# Patient Record
Sex: Male | Born: 1945
Health system: Southern US, Community
[De-identification: ages and names within clinical notes are randomized; demographics above are authoritative.]

## PROBLEM LIST (undated history)

## (undated) DIAGNOSIS — E039 Hypothyroidism, unspecified: Secondary | ICD-10-CM

## (undated) DIAGNOSIS — C801 Malignant (primary) neoplasm, unspecified: Secondary | ICD-10-CM

## (undated) DIAGNOSIS — E079 Disorder of thyroid, unspecified: Secondary | ICD-10-CM

## (undated) DIAGNOSIS — I10 Essential (primary) hypertension: Secondary | ICD-10-CM

## (undated) DIAGNOSIS — L709 Acne, unspecified: Secondary | ICD-10-CM

## (undated) DIAGNOSIS — R0600 Dyspnea, unspecified: Secondary | ICD-10-CM

## (undated) DIAGNOSIS — E119 Type 2 diabetes mellitus without complications: Secondary | ICD-10-CM

## (undated) HISTORY — DX: Essential (primary) hypertension: I10

## (undated) HISTORY — DX: Malignant (primary) neoplasm, unspecified: C80.1

## (undated) HISTORY — DX: Acne, unspecified: L70.9

## (undated) HISTORY — PX: TONSILLECTOMY: SHX5217

## (undated) HISTORY — DX: Disorder of thyroid, unspecified: E07.9

## (undated) HISTORY — PX: UPPER GASTROINTESTINAL ENDOSCOPY: SHX188

---

## 2001-02-02 ENCOUNTER — Inpatient Hospital Stay (HOSPITAL_COMMUNITY): Admission: AD | Admit: 2001-02-02 | Discharge: 2001-02-05 | Payer: Self-pay | Admitting: Family Medicine

## 2001-02-02 ENCOUNTER — Encounter: Payer: Self-pay | Admitting: Family Medicine

## 2001-02-05 ENCOUNTER — Encounter: Payer: Self-pay | Admitting: Family Medicine

## 2001-02-10 ENCOUNTER — Encounter: Payer: Self-pay | Admitting: Family Medicine

## 2001-02-10 ENCOUNTER — Ambulatory Visit (HOSPITAL_COMMUNITY): Admission: RE | Admit: 2001-02-10 | Discharge: 2001-02-10 | Payer: Self-pay | Admitting: Family Medicine

## 2003-02-14 ENCOUNTER — Encounter: Admission: RE | Admit: 2003-02-14 | Discharge: 2003-02-14 | Payer: Self-pay | Admitting: General Surgery

## 2003-02-16 ENCOUNTER — Ambulatory Visit (HOSPITAL_BASED_OUTPATIENT_CLINIC_OR_DEPARTMENT_OTHER): Admission: RE | Admit: 2003-02-16 | Discharge: 2003-02-16 | Payer: Self-pay | Admitting: General Surgery

## 2003-02-16 ENCOUNTER — Ambulatory Visit (HOSPITAL_COMMUNITY): Admission: RE | Admit: 2003-02-16 | Discharge: 2003-02-16 | Payer: Self-pay | Admitting: General Surgery

## 2004-06-05 ENCOUNTER — Ambulatory Visit: Payer: Self-pay | Admitting: Family Medicine

## 2004-06-12 ENCOUNTER — Ambulatory Visit: Payer: Self-pay | Admitting: Family Medicine

## 2004-10-23 ENCOUNTER — Ambulatory Visit: Payer: Self-pay | Admitting: Family Medicine

## 2004-10-29 ENCOUNTER — Ambulatory Visit: Payer: Self-pay | Admitting: Family Medicine

## 2005-04-21 HISTORY — PX: HERNIA REPAIR: SHX51

## 2005-08-19 ENCOUNTER — Ambulatory Visit: Payer: Self-pay | Admitting: Family Medicine

## 2005-11-03 ENCOUNTER — Ambulatory Visit: Payer: Self-pay | Admitting: Family Medicine

## 2005-11-27 ENCOUNTER — Ambulatory Visit: Payer: Self-pay | Admitting: Family Medicine

## 2006-11-25 ENCOUNTER — Ambulatory Visit: Payer: Self-pay | Admitting: Family Medicine

## 2006-11-25 LAB — CONVERTED CEMR LAB
ALT: 37 units/L (ref 0–53)
AST: 26 units/L (ref 0–37)
Albumin: 4.3 g/dL (ref 3.5–5.2)
Alkaline Phosphatase: 54 units/L (ref 39–117)
BUN: 19 mg/dL (ref 6–23)
Basophils Absolute: 0.1 10*3/uL (ref 0.0–0.1)
Basophils Relative: 1.1 % — ABNORMAL HIGH (ref 0.0–1.0)
Bilirubin Urine: NEGATIVE
Bilirubin, Direct: 0.2 mg/dL (ref 0.0–0.3)
Blood in Urine, dipstick: NEGATIVE
CO2: 27 meq/L (ref 19–32)
Calcium: 9.4 mg/dL (ref 8.4–10.5)
Chloride: 102 meq/L (ref 96–112)
Cholesterol: 179 mg/dL (ref 0–200)
Creatinine, Ser: 1 mg/dL (ref 0.4–1.5)
Eosinophils Absolute: 0.1 10*3/uL (ref 0.0–0.6)
Eosinophils Relative: 2.6 % (ref 0.0–5.0)
GFR calc Af Amer: 98 mL/min
GFR calc non Af Amer: 81 mL/min
Glucose, Bld: 118 mg/dL — ABNORMAL HIGH (ref 70–99)
Glucose, Urine, Semiquant: NEGATIVE
HCT: 44 % (ref 39.0–52.0)
HDL: 37.3 mg/dL — ABNORMAL LOW (ref >39.0)
Hemoglobin: 15.6 g/dL (ref 13.0–17.0)
Ketones, urine, test strip: NEGATIVE
LDL Cholesterol: 118 mg/dL — ABNORMAL HIGH (ref 0–99)
Lymphocytes Relative: 23.4 % (ref 12.0–46.0)
MCHC: 35.5 g/dL (ref 30.0–36.0)
MCV: 91.7 fL (ref 78.0–100.0)
Monocytes Absolute: 0.4 10*3/uL (ref 0.2–0.7)
Monocytes Relative: 8 % (ref 3.0–11.0)
Neutro Abs: 3 10*3/uL (ref 1.4–7.7)
Neutrophils Relative %: 64.9 % (ref 43.0–77.0)
Nitrite: NEGATIVE
PSA: 0.66 ng/mL (ref 0.10–4.00)
Platelets: 192 10*3/uL (ref 150–400)
Potassium: 3.2 meq/L — ABNORMAL LOW (ref 3.5–5.1)
Protein, U semiquant: NEGATIVE
RBC: 4.8 M/uL (ref 4.22–5.81)
RDW: 12.8 % (ref 11.5–14.6)
Sodium: 139 meq/L (ref 135–145)
Specific Gravity, Urine: 1.02
TSH: 0.69 microintl units/mL (ref 0.35–5.50)
Total Bilirubin: 1.4 mg/dL — ABNORMAL HIGH (ref 0.3–1.2)
Total CHOL/HDL Ratio: 4.8
Total Protein: 6.7 g/dL (ref 6.0–8.3)
Triglycerides: 121 mg/dL (ref 0–149)
Urobilinogen, UA: NEGATIVE
VLDL: 24 mg/dL (ref 0–40)
WBC Urine, dipstick: NEGATIVE
WBC: 4.7 10*3/uL (ref 4.5–10.5)
pH: 5.5

## 2006-12-01 ENCOUNTER — Ambulatory Visit: Payer: Self-pay | Admitting: Family Medicine

## 2006-12-01 DIAGNOSIS — M109 Gout, unspecified: Secondary | ICD-10-CM | POA: Insufficient documentation

## 2006-12-01 DIAGNOSIS — I10 Essential (primary) hypertension: Secondary | ICD-10-CM | POA: Insufficient documentation

## 2006-12-01 DIAGNOSIS — E039 Hypothyroidism, unspecified: Secondary | ICD-10-CM | POA: Insufficient documentation

## 2007-01-27 ENCOUNTER — Ambulatory Visit: Payer: Self-pay | Admitting: Family Medicine

## 2007-12-06 ENCOUNTER — Telehealth: Payer: Self-pay | Admitting: Family Medicine

## 2008-01-27 ENCOUNTER — Ambulatory Visit: Payer: Self-pay | Admitting: Family Medicine

## 2008-01-27 LAB — CONVERTED CEMR LAB
ALT: 32 units/L (ref 0–53)
AST: 23 units/L (ref 0–37)
Albumin: 4.2 g/dL (ref 3.5–5.2)
Alkaline Phosphatase: 50 units/L (ref 39–117)
BUN: 19 mg/dL (ref 6–23)
Basophils Absolute: 0.1 10*3/uL (ref 0.0–0.1)
Basophils Relative: 1 % (ref 0.0–3.0)
Bilirubin Urine: NEGATIVE
Bilirubin, Direct: 0.2 mg/dL (ref 0.0–0.3)
Blood in Urine, dipstick: NEGATIVE
CO2: 30 meq/L (ref 19–32)
Calcium: 9.3 mg/dL (ref 8.4–10.5)
Chloride: 104 meq/L (ref 96–112)
Cholesterol: 149 mg/dL (ref 0–200)
Creatinine, Ser: 1.1 mg/dL (ref 0.4–1.5)
Eosinophils Absolute: 0.1 10*3/uL (ref 0.0–0.7)
Eosinophils Relative: 2 % (ref 0.0–5.0)
GFR calc Af Amer: 88 mL/min
GFR calc non Af Amer: 73 mL/min
Glucose, Bld: 99 mg/dL (ref 70–99)
Glucose, Urine, Semiquant: NEGATIVE
HCT: 42.1 % (ref 39.0–52.0)
HDL: 33.9 mg/dL — ABNORMAL LOW (ref >39.0)
Hemoglobin: 14.9 g/dL (ref 13.0–17.0)
Ketones, urine, test strip: NEGATIVE
LDL Cholesterol: 98 mg/dL (ref 0–99)
Lymphocytes Relative: 22.3 % (ref 12.0–46.0)
MCHC: 35.3 g/dL (ref 30.0–36.0)
MCV: 91.6 fL (ref 78.0–100.0)
Monocytes Absolute: 0.4 10*3/uL (ref 0.1–1.0)
Monocytes Relative: 7.8 % (ref 3.0–12.0)
Neutro Abs: 3.4 10*3/uL (ref 1.4–7.7)
Neutrophils Relative %: 66.9 % (ref 43.0–77.0)
Nitrite: NEGATIVE
PSA: 0.84 ng/mL (ref 0.10–4.00)
Platelets: 187 10*3/uL (ref 150–400)
Potassium: 3.6 meq/L (ref 3.5–5.1)
RBC: 4.59 M/uL (ref 4.22–5.81)
RDW: 12.9 % (ref 11.5–14.6)
Sodium: 141 meq/L (ref 135–145)
Specific Gravity, Urine: 1.02
TSH: 0.71 microintl units/mL (ref 0.35–5.50)
Total Bilirubin: 1 mg/dL (ref 0.3–1.2)
Total CHOL/HDL Ratio: 4.4
Total Protein: 6.6 g/dL (ref 6.0–8.3)
Triglycerides: 85 mg/dL (ref 0–149)
Urobilinogen, UA: 0.2
VLDL: 17 mg/dL (ref 0–40)
WBC Urine, dipstick: NEGATIVE
WBC: 5.1 10*3/uL (ref 4.5–10.5)
pH: 5.5

## 2008-03-06 ENCOUNTER — Ambulatory Visit: Payer: Self-pay | Admitting: Family Medicine

## 2008-03-07 ENCOUNTER — Telehealth: Payer: Self-pay | Admitting: Family Medicine

## 2008-10-11 ENCOUNTER — Telehealth: Payer: Self-pay | Admitting: Family Medicine

## 2009-03-12 ENCOUNTER — Ambulatory Visit: Payer: Self-pay | Admitting: Family Medicine

## 2009-11-21 ENCOUNTER — Ambulatory Visit: Payer: Self-pay | Admitting: Family Medicine

## 2009-11-21 DIAGNOSIS — F988 Other specified behavioral and emotional disorders with onset usually occurring in childhood and adolescence: Secondary | ICD-10-CM | POA: Insufficient documentation

## 2009-12-05 ENCOUNTER — Ambulatory Visit: Payer: Self-pay | Admitting: Family Medicine

## 2010-01-10 ENCOUNTER — Telehealth: Payer: Self-pay | Admitting: Family Medicine

## 2010-02-26 ENCOUNTER — Ambulatory Visit: Payer: Self-pay | Admitting: Family Medicine

## 2010-02-26 LAB — CONVERTED CEMR LAB
ALT: 27 units/L (ref 0–53)
AST: 22 units/L (ref 0–37)
Albumin: 4.2 g/dL (ref 3.5–5.2)
Alkaline Phosphatase: 58 units/L (ref 39–117)
BUN: 25 mg/dL — ABNORMAL HIGH (ref 6–23)
Basophils Absolute: 0 10*3/uL (ref 0.0–0.1)
Basophils Relative: 0.6 % (ref 0.0–3.0)
Bilirubin, Direct: 0.2 mg/dL (ref 0.0–0.3)
CO2: 28 meq/L (ref 19–32)
Calcium: 9.6 mg/dL (ref 8.4–10.5)
Chloride: 104 meq/L (ref 96–112)
Cholesterol: 168 mg/dL (ref 0–200)
Creatinine, Ser: 1.1 mg/dL (ref 0.4–1.5)
Eosinophils Absolute: 0.1 10*3/uL (ref 0.0–0.7)
Eosinophils Relative: 1.5 % (ref 0.0–5.0)
GFR calc non Af Amer: 72.77 mL/min (ref >60)
Glucose, Bld: 111 mg/dL — ABNORMAL HIGH (ref 70–99)
HCT: 44.5 % (ref 39.0–52.0)
HDL: 37.2 mg/dL — ABNORMAL LOW (ref >39.00)
Hemoglobin: 15.6 g/dL (ref 13.0–17.0)
LDL Cholesterol: 102 mg/dL — ABNORMAL HIGH (ref 0–99)
Lymphocytes Relative: 18.9 % (ref 12.0–46.0)
Lymphs Abs: 1.4 10*3/uL (ref 0.7–4.0)
MCHC: 35 g/dL (ref 30.0–36.0)
MCV: 92.4 fL (ref 78.0–100.0)
Monocytes Absolute: 0.5 10*3/uL (ref 0.1–1.0)
Monocytes Relative: 6.1 % (ref 3.0–12.0)
Neutro Abs: 5.6 10*3/uL (ref 1.4–7.7)
Neutrophils Relative %: 72.9 % (ref 43.0–77.0)
PSA: 0.46 ng/mL (ref 0.10–4.00)
Platelets: 187 10*3/uL (ref 150.0–400.0)
Potassium: 3.5 meq/L (ref 3.5–5.1)
RBC: 4.82 M/uL (ref 4.22–5.81)
RDW: 13.9 % (ref 11.5–14.6)
Sodium: 140 meq/L (ref 135–145)
TSH: 0.26 microintl units/mL — ABNORMAL LOW (ref 0.35–5.50)
Total Bilirubin: 1.1 mg/dL (ref 0.3–1.2)
Total CHOL/HDL Ratio: 5
Total Protein: 6.4 g/dL (ref 6.0–8.3)
Triglycerides: 146 mg/dL (ref 0.0–149.0)
VLDL: 29.2 mg/dL (ref 0.0–40.0)
WBC: 7.6 10*3/uL (ref 4.5–10.5)

## 2010-03-05 ENCOUNTER — Ambulatory Visit: Payer: Self-pay | Admitting: Family Medicine

## 2010-03-05 ENCOUNTER — Telehealth: Payer: Self-pay | Admitting: Family Medicine

## 2010-03-05 ENCOUNTER — Encounter: Payer: Self-pay | Admitting: Family Medicine

## 2010-04-19 ENCOUNTER — Encounter: Payer: Self-pay | Admitting: Family Medicine

## 2010-04-30 ENCOUNTER — Encounter: Payer: Self-pay | Admitting: Family Medicine

## 2010-04-30 ENCOUNTER — Ambulatory Visit
Admission: RE | Admit: 2010-04-30 | Discharge: 2010-04-30 | Payer: Self-pay | Source: Home / Self Care | Attending: Family Medicine | Admitting: Family Medicine

## 2010-05-01 ENCOUNTER — Encounter: Payer: Self-pay | Admitting: Family Medicine

## 2010-05-01 ENCOUNTER — Telehealth: Payer: Self-pay | Admitting: Family Medicine

## 2010-05-06 DIAGNOSIS — L820 Inflamed seborrheic keratosis: Secondary | ICD-10-CM | POA: Insufficient documentation

## 2010-05-16 ENCOUNTER — Telehealth: Payer: Self-pay | Admitting: Family Medicine

## 2010-05-20 ENCOUNTER — Other Ambulatory Visit: Payer: Self-pay | Admitting: Family Medicine

## 2010-05-20 ENCOUNTER — Ambulatory Visit
Admission: RE | Admit: 2010-05-20 | Discharge: 2010-05-20 | Payer: Self-pay | Source: Home / Self Care | Attending: Family Medicine | Admitting: Family Medicine

## 2010-05-20 LAB — T4, FREE: Free T4: 1.27 ng/dL (ref 0.60–1.60)

## 2010-05-21 ENCOUNTER — Telehealth: Payer: Self-pay | Admitting: Family Medicine

## 2010-05-21 NOTE — Progress Notes (Signed)
Summary: PHARMACY VERIFICATION  Phone Note From Pharmacy   Caller: [facility] Summary of Call: [facility] [address] [place])  called to get information about Rx for med:   VICODIN ES 7.5-750 MG  - she advised that they need how many / how often..... Call back # [phone].  Initial call taken by: Debbra Riding,  March 05, 2010 3:53 PM  Follow-up for Phone Call        Vicodin ES dispense 30 tabs directions, one every 6 to 8 hours as needed for severe pain, refills x 1 Follow-up by: Roderick Pee MD,  March 05, 2010 4:06 PM    New/Updated Medications: VICODIN ES 7.5-750 MG  TABS (HYDROCODONE-ACETAMINOPHEN) 1 tab at bedtime as needed

## 2010-05-21 NOTE — Assessment & Plan Note (Signed)
Summary: 2 week fup//ccm   History of Present Illness: [name] is a 65 year old, married male, nonsmoker, who comes in today for reevaluation of adult ADD.  We started him on 20 mg of Adderall however, he felt it was too strong.  We, therefore decrease the dose to 10 mg b.i.d.  He typically takes one daily, sometimes twice depending on how much work he has to do on the computer.  No major side effects  Allergies: No Known Drug Allergies  Past History:  Past medical, surgical, family and social histories (including risk factors) reviewed for relevance to current acute and chronic problems.  Past Medical History: Reviewed history from 03/06/2008 and no changes required. Hypertension Hypothyroidism gout adult acne  Past Surgical History: Reviewed history from 12/01/2006 and no changes required. Thyroidectomy Inguinal herniorrhaphyX2 L&R  Family History: Reviewed history from 03/06/2008 and no changes required. Family History Hypertension  Social History: Reviewed history from 03/06/2008 and no changes required. Occupation: Married Never Smoked Alcohol use-no Drug use-no Regular exercise-no  Review of Systems      See HPI  Physical Exam  General:  Well-developed,well-nourished,in no acute distress; alert,appropriate and cooperative throughout examination Psych:  Cognition and judgment appear intact. Alert and cooperative with normal attention span and concentration. No apparent delusions, illusions, hallucinations   Impression & Recommendations:  Problem # 1:  ATTENTION DEFICIT DISORDER, ADULT (ICD-314.00) Assessment Improved  Complete Medication List: 1)  Metoprolol Tartrate 50 Mg Tabs (Metoprolol tartrate) .[name].. 1 once daily 2)  Triamterene-hctz 75-50 Mg Tabs (Triamterene-hctz) .[name].. 1 once daily 3)  Vicodin Es 7.5-750 Mg Tabs (Hydrocodone-acetaminophen) .... As needed 4)  Synthroid 150 Mcg Tabs (Levothyroxine sodium) .[name].. 1 once daily 5)  Vicks Formula 44 Cough  Relief 30 Mg/34ml Liqd (Dextromethorphan hbr) .... As needed with cough 6)  Allopurinol 300 Mg Tabs (Allopurinol) .... Take 1 tablet by mouth every morning 7)  Diprolene 0.05 % Oint (Betamethasone dipropionate aug) .... Apply at bedtime 8)  Amphetamine-dextroamphetamine 10 Mg Tabs (Amphetamine-dextroamphetamine) .... Take 1 tablet by mouth two times a day 9)  Keflex 500 Mg Caps (Cephalexin) .... 2 by mouth two times a day  Patient Instructions: 1)  continue the 10 mg twice daily as needed and  when you are two weeks from being out of your third prescription.......... call leave a voicemail for Rachael that you need refills .........[name] we will call you to come pick up the prescriptions. 2)  Please schedule a follow-up appointment in 3 months........250.00 3)  BMP prior to visit, ICD-9: 4)  Hepatic Panel prior to visit, ICD-9: 5)  Lipid Panel prior to visit, ICD-9: 6)  TSH prior to visit, ICD-9: 7)  CBC w/ Diff prior to visit, ICD-9: 8)  PSA prior to visit, ICD-9:.................................30 min appt. for cpx Prescriptions: AMPHETAMINE-DEXTROAMPHETAMINE 10 MG TABS (AMPHETAMINE-DEXTROAMPHETAMINE) Take 1 tablet by mouth two times a day  #60 x 0   Entered and Authorized by:   Roderick Pee MD   Signed by:   Roderick Pee MD on 12/05/2009   Method used:   Print then Give to Patient   RxID:   [id number] AMPHETAMINE-DEXTROAMPHETAMINE 10 MG TABS (AMPHETAMINE-DEXTROAMPHETAMINE) Take 1 tablet by mouth two times a day  #60 x 0   Entered and Authorized by:   Roderick Pee MD   Signed by:   Roderick Pee MD on 12/05/2009   Method used:   Print then Give to Patient   RxID:   [id number]

## 2010-05-21 NOTE — Assessment & Plan Note (Signed)
Summary: consult re: adhd med/cjr   Vital Signs:  Patient profile:   65 year old male Weight:      200 pounds Temp:     98.3 degrees F oral BP sitting:   118 / 80  (left arm) Cuff size:   regular  Vitals Entered By: Kern Reap CMA Duncan Dull) (November 21, 2009 9:38 AM) CC: add medication, rx for scalp   CC:  add medication and rx for scalp.  History of Present Illness: Jose Evans is a 65 year old, married male, nonsmoker, who comes in today for evaluation of possible ADD.  He actually has struggled with ADD for his entire life.  He tested to have a very high IQ however, his performance in high school and college at [employer]  were not up to his IQ.  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  he also has recurrent folliculitis on his scalp and would like therapy  Allergies (verified): No Known Drug Allergies  Past History:  Past medical, surgical, family and social histories (including risk factors) reviewed, and no changes noted (except as noted below).  Past Medical History: Reviewed history from 03/06/2008 and no changes required. Hypertension Hypothyroidism gout adult acne  Past Surgical History: Reviewed history from 12/01/2006 and no changes required. Thyroidectomy Inguinal herniorrhaphyX2 L&R  Family History: Reviewed history from 03/06/2008 and no changes required. Family History Hypertension  Social History: Reviewed history from 03/06/2008 and no changes required. Occupation: Married Never Smoked Alcohol use-no Drug use-no Regular exercise-no  Review of Systems      See HPI  Physical Exam  General:  Well-developed,well-nourished,in no acute distress; alert,appropriate and cooperative throughout examination Psych:  Cognition and judgment appear intact. Alert and cooperative with normal attention  span and concentration. No apparent delusions, illusions, hallucinations   Impression & Recommendations:  Problem # 1:  ATTENTION DEFICIT DISORDER, ADULT (ICD-314.00) Assessment New  Problem # 2:  FOLLICULITIS, CHRONIC (ICD-704.8) Assessment: New  Complete Medication List: 1)  Metoprolol Tartrate 50 Mg Tabs (Metoprolol tartrate) .[name].. 1 once daily 2)  Triamterene-hctz 75-50 Mg Tabs (Triamterene-hctz) .[name].. 1 once daily 3)  Vicodin Es 7.5-750 Mg Tabs (Hydrocodone-acetaminophen) .... As needed 4)  Synthroid 150 Mcg Tabs (Levothyroxine sodium) .[name].. 1 once daily 5)  Vicks Formula 44 Cough Relief 30 Mg/27ml Liqd (Dextromethorphan hbr) .... As needed with cough 6)  Allopurinol 300 Mg Tabs (Allopurinol) .... Take 1 tablet by mouth every morning 7)  Diprolene 0.05 % Oint (Betamethasone dipropionate aug) .... Apply at bedtime 8)  Adderall Xr 20 Mg Xr24h-cap (Amphetamine-dextroamphetamine) .... Take 1 tablet by mouth every morning 9)  Amphetamine-dextroamphetamine 10 Mg Tabs (Amphetamine-dextroamphetamine) .... Take 1 tablet by mouth two times a day 10)  Keflex 500 Mg Caps (Cephalexin) .... 2 by mouth two times a day  Patient Instructions: 1)  begin either the long-acting Adderall 20 mg q.a.m. for the short acting Adderall 10 mg twice daily.  Return in two weeks for follow-up Prescriptions: KEFLEX 500 MG CAPS (CEPHALEXIN) 2 by mouth two times a day  #50 x 3   Entered and Authorized by:   Roderick Pee MD   Signed by:   Roderick Pee MD on 11/21/2009   Method used:   Print then Give to Patient   RxID:   [id number] AMPHETAMINE-DEXTROAMPHETAMINE 10 MG TABS (AMPHETAMINE-DEXTROAMPHETAMINE) Take 1 tablet by mouth two  times a day  #60 x 0   Entered and Authorized by:   Roderick Pee MD   Signed by:   Roderick Pee MD on 11/21/2009   Method used:   Print then Give to Patient   RxID:   [id number] ADDERALL XR 20 MG XR24H-CAP (AMPHETAMINE-DEXTROAMPHETAMINE) Take 1 tablet by mouth every  morning  #30 x 0   Entered and Authorized by:   Roderick Pee MD   Signed by:   Roderick Pee MD on 11/21/2009   Method used:   Print then Give to Patient   RxID:   [id number]

## 2010-05-21 NOTE — Progress Notes (Signed)
Summary: new rx  Phone Note Call from Patient Call back at Home Phone [phone]   Caller: Patient Call For: Roderick Pee MD Summary of Call: pt needs generic adderall 10 mg 1 pill twice a day Initial call taken by: Heron Sabins,  January 10, 2010 8:54 AM  Follow-up for Phone Call        per pt brought rx for 20 mg back Follow-up by: Heron Sabins,  January 11, 2010 3:29 PM  Additional Follow-up for Phone Call Additional follow up Details #1::        pt is aware rx ready for pick up Additional Follow-up by: Heron Sabins,  January 11, 2010 3:59 PM    New/Updated Medications: AMPHETAMINE-DEXTROAMPHETAMINE 10 MG TABS (AMPHETAMINE-DEXTROAMPHETAMINE) Take 1 tablet by mouth two times a day AMPHETAMINE-DEXTROAMPHETAMINE 10 MG TABS (AMPHETAMINE-DEXTROAMPHETAMINE) take one tab by mouth two times a day  fill in one month AMPHETAMINE-DEXTROAMPHETAMINE 10 MG TABS (AMPHETAMINE-DEXTROAMPHETAMINE) take one tab by mouth two times a day  fill in two months Prescriptions: AMPHETAMINE-DEXTROAMPHETAMINE 10 MG TABS (AMPHETAMINE-DEXTROAMPHETAMINE) take one tab by mouth two times a day  fill in two months  #60 x 0   Entered by:   Kern Reap CMA (AAMA)   Authorized by:   Roderick Pee MD   Signed by:   Kern Reap CMA (AAMA) on 01/11/2010   Method used:   Print then Give to Patient   RxID:   [id number] AMPHETAMINE-DEXTROAMPHETAMINE 10 MG TABS (AMPHETAMINE-DEXTROAMPHETAMINE) take one tab by mouth two times a day  fill in one month  #60 x 0   Entered by:   Kern Reap CMA (AAMA)   Authorized by:   Roderick Pee MD   Signed by:   Kern Reap CMA (AAMA) on 01/11/2010   Method used:   Print then Give to Patient   RxID:   [id number] AMPHETAMINE-DEXTROAMPHETAMINE 10 MG TABS (AMPHETAMINE-DEXTROAMPHETAMINE) Take 1 tablet by mouth two times a day  #60 x 0   Entered by:   Kern Reap CMA (AAMA)   Authorized by:   Roderick Pee MD   Signed by:   Kern Reap CMA (AAMA) on 01/11/2010   Method used:   Print then Give to Patient   RxID:   [id number]

## 2010-05-21 NOTE — Assessment & Plan Note (Signed)
Summary: CPX/NJR   Vital Signs:  Patient profile:   65 year old male Height:      68.5 inches Weight:      202 pounds BMI:     30.38 Temp:     98.3 degrees F oral BP sitting:   120 / 80  (left arm) Cuff size:   regular  Vitals Entered By: Kern Reap CMA Duncan Dull) (March 05, 2010 8:23 AM) CC: cpx Is Patient Diabetic? No Pain Assessment Patient in pain? no        CC:  cpx.  History of Present Illness: Nathaniel is a 65 year old, married male, nonsmoker, who comes in today for general physical examination because of a history of underlying hypertension, hypothyroidism, ADD and foot pain.  His hypertension is treated with a beta blocker 50 mg daily and Maxzide 75 -- 50 daily.  BP 120/80.  He takes Synthroid 150 micrograms daily for hypothyroidism.  TSH level was low.  Will adjust medication downward.  He takes dextroamphetamine 10 mg daily for ADD functioning well.  He has bilateral foot pain.  He seen a couple people, diagnosis unknown.  I recommend he see Dr. Toni Arthurs for further evaluation.  No history of trauma.  He does have a history of gout.  However, he does he takes allopurinol p.r.n..  Advised to take it daily.  He gets routine eye care and, dental care, has previously declined getting a colonoscopy again encouraged to get a colonoscopy.  Tetanus unknown.  Will check paper chart seasonal flu shot today.  Allergies: No Known Drug Allergies  Past History:  Past medical, surgical, family and social histories (including risk factors) reviewed, and no changes noted (except as noted below).  Past Medical History: Reviewed history from 03/06/2008 and no changes required. Hypertension Hypothyroidism gout adult acne  Past Surgical History: Reviewed history from 12/01/2006 and no changes required. Thyroidectomy Inguinal herniorrhaphyX2 L&R  Family History: Reviewed history from 03/06/2008 and no changes required. Family History Hypertension  Social  History: Reviewed history from 03/06/2008 and no changes required. Occupation: Married Never Smoked Alcohol use-no Drug use-no Regular exercise-no  Review of Systems      See HPI       Flu Vaccine Consent Questions     Do you have a history of severe allergic reactions to this vaccine? no    Any prior history of allergic reactions to egg and/or gelatin? no    Do you have a sensitivity to the preservative Thimersol? no    Do you have a past history of Guillan-Barre Syndrome? no    Do you currently have an acute febrile illness? no    Have you ever had a severe reaction to latex? no    Vaccine information given and explained to patient? yes    Are you currently pregnant? no    Lot Number:[id number]   Exp Date:10/19/2010   Site Given  Right  Deltoid IM   Physical Exam  General:  Well-developed,well-nourished,in no acute distress; alert,appropriate and cooperative throughout examination Head:  Normocephalic and atraumatic without obvious abnormalities. No apparent alopecia or balding. Eyes:  No corneal or conjunctival inflammation noted. EOMI. Perrla. Funduscopic exam benign, without hemorrhages, exudates or papilledema. Vision grossly normal. Ears:  External ear exam shows no significant lesions or deformities.  Otoscopic examination reveals clear canals, tympanic membranes are intact bilaterally without bulging, retraction, inflammation or discharge. Hearing is grossly normal bilaterally. Nose:  External nasal examination shows no deformity or inflammation. Nasal mucosa are pink and  moist without lesions or exudates. Mouth:  Oral mucosa and oropharynx without lesions or exudates.  Teeth in good repair. Neck:  No deformities, masses, or tenderness noted. Chest Wall:  No deformities, masses, tenderness or gynecomastia noted. Breasts:  No masses or gynecomastia noted Lungs:  Normal respiratory effort, chest expands symmetrically. Lungs are clear to auscultation, no crackles or  wheezes. Heart:  Normal rate and regular rhythm. S1 and S2 normal without gallop, murmur, click, rub or other extra sounds. Abdomen:  Bowel sounds positive,abdomen soft and non-tender without masses, organomegaly or hernias noted. Rectal:  No external abnormalities noted. Normal sphincter tone. No rectal masses or tenderness. Genitalia:  Testes bilaterally descended without nodularity, tenderness or masses. No scrotal masses or lesions. No penis lesions or urethral discharge. Prostate:  Prostate gland firm and smooth, no enlargement, nodularity, tenderness, mass, asymmetry or induration. Msk:  No deformity or scoliosis noted of thoracic or lumbar spine.   Pulses:  R and L carotid,radial,femoral,dorsalis pedis and posterior tibial pulses are full and equal bilaterally Extremities:  No clubbing, cyanosis, edema, or deformity noted with normal full range of motion of all joints.   Neurologic:  No cranial nerve deficits noted. Station and gait are normal. Plantar reflexes are down-going bilaterally. DTRs are symmetrical throughout. Sensory, motor and coordinative functions appear intact. Skin:  Intact without suspicious lesions or rashes...............Marland Kitchena rather large  seborrheic keratosis on his right shoulder encouraged to return for removal Cervical Nodes:  No lymphadenopathy noted Axillary Nodes:  No palpable lymphadenopathy Inguinal Nodes:  No significant adenopathy Psych:  Cognition and judgment appear intact. Alert and cooperative with normal attention span and concentration. No apparent delusions, illusions, hallucinations   Impression & Recommendations:  Problem # 1:  ATTENTION DEFICIT DISORDER, ADULT (ICD-314.00) Assessment Improved  Orders: Prescription Created Electronically [phone])  Problem # 2:  GOUT NOS (ICD-274.9) Assessment: Improved  His updated medication list for this problem includes:    Allopurinol 300 Mg Tabs (Allopurinol) .[name]... Take 1 tablet by mouth every  morning  Orders: Prescription Created Electronically [phone])  Problem # 3:  HYPOTHYROIDISM (ICD-244.9) Assessment: Improved  The following medications were removed from the medication list:    Synthroid 150 Mcg Tabs (Levothyroxine sodium) .[name]... 1 once daily His updated medication list for this problem includes:    Synthroid 125 Mcg Tabs (Levothyroxine sodium) .[name]... Take 1 tablet by mouth every morning  Orders: Prescription Created Electronically [phone])  Problem # 4:  HYPERTENSION (ICD-401.9) Assessment: Improved  His updated medication list for this problem includes:    Metoprolol Tartrate 50 Mg Tabs (Metoprolol tartrate) .[name]... 1 once daily    Triamterene-hctz 75-50 Mg Tabs (Triamterene-hctz) .[name]... 1 once daily  Orders: Prescription Created Electronically [phone]) EKG w/ Interpretation (93000)  Problem # 5:  HEALTH SCREENING (ICD-V70.0) Assessment: Unchanged  Orders: Prescription Created Electronically [phone]) EKG w/ Interpretation (93000) Gastroenterology Referral (GI)  Complete Medication List: 1)  Metoprolol Tartrate 50 Mg Tabs (Metoprolol tartrate) .[name].. 1 once daily 2)  Triamterene-hctz 75-50 Mg Tabs (Triamterene-hctz) .[name].. 1 once daily 3)  Vicodin Es 7.5-750 Mg Tabs (Hydrocodone-acetaminophen) .... As needed 4)  Vicks Formula 44 Cough Relief 30 Mg/55ml Liqd (Dextromethorphan hbr) .... As needed with cough 5)  Allopurinol 300 Mg Tabs (Allopurinol) .... Take 1 tablet by mouth every morning 6)  Diprolene 0.05 % Oint (Betamethasone dipropionate aug) .... Apply at bedtime 7)  Amphetamine-dextroamphetamine 10 Mg Tabs (Amphetamine-dextroamphetamine) .... Take 1 tablet by mouth every morning 8)  Amphetamine-dextroamphetamine 10 Mg Tabs (Amphetamine-dextroamphetamine) .... Take one  tab by mouth daily........[name] fill in one month 9)  Amphetamine-dextroamphetamine 10 Mg Tabs (Amphetamine-dextroamphetamine) .... Take one tab by mouth daily......[name] fill in two months 10)  Propecia 1  Mg Tabs (Finasteride) .... Take 1 tablet by mouth every morning 11)  Synthroid 125 Mcg Tabs (Levothyroxine sodium) .... Take 1 tablet by mouth every morning  Other Orders: Admin 1st Vaccine ([address]) Flu Vaccine 31yrs + ([address])  Patient Instructions: 1)  take the allopurinol daily. 2)  Decrease the Synthroid to 125 micrograms daily. 3)  Consult with Dr. Toni Arthurs, orthopedist, with [facility]........ 4)  Return sometime in the next couple weeks to get the lesion removed near right shoulder. 5)  Schedule a colonoscopy/sigmoidoscopy to help detect colon cancer. 6)  Take an Aspirin every day. Prescriptions: SYNTHROID 125 MCG TABS (LEVOTHYROXINE SODIUM) Take 1 tablet by mouth every morning  #100 x 3   Entered and Authorized by:   Roderick Pee MD   Signed by:   Roderick Pee MD on 03/05/2010   Method used:   Electronically to        [facility] (retail[facility]       [address]       [place], [place]  [address]       Ph: [phone] or [phone]       Fax: [phone]   RxID:   [id number] PROPECIA 1 MG TABS (FINASTERIDE) Take 1 tablet by mouth every morning  #100 x 3   Entered and Authorized by:   Roderick Pee MD   Signed by:   Roderick Pee MD on 03/05/2010   Method used:   Electronically to        [facility] (retail)       [address]       [place], [place]  [address]       Ph: [phone] or [phone]       Fax: [phone]   RxID:   [id number] DIPROLENE 0.05 % OINT (BETAMETHASONE DIPROPIONATE AUG) apply at bedtime  #60 gr x 2   Entered and Authorized by:   Roderick Pee MD   Signed by:   Roderick Pee MD on 03/05/2010   Method used:   Electronically to        [facility] (retail)       [address]       [place], [place]  [address]       Ph: [phone] or [phone]       Fax: [phone]   RxID:   [id number] ALLOPURINOL 300 MG TABS (ALLOPURINOL) Take 1 tablet by mouth every morning  #100 x 3   Entered and Authorized by:   Roderick Pee MD   Signed by:   Roderick Pee MD on 03/05/2010   Method used:   Electronically to        [facility] (retail)       [address]       [place], [place]  [address]       Ph: [phone] or [phone]       Fax: [phone]   RxID:   [id number] METOPROLOL TARTRATE 50 MG TABS (METOPROLOL TARTRATE) 1 once daily  #100 x 3   Entered and Authorized by:   Roderick Pee MD   Signed by:   Roderick Pee MD on 03/05/2010   Method used:   [facility]        [facility]       [address]       [place], [place]  [address]       Ph: [phone] or [phone]       Fax: [phone]   RxID:   [id number] TRIAMTERENE-HCTZ 75-50 MG TABS (TRIAMTERENE-HCTZ) 1 once daily  #100 Tablet x 3   Entered and Authorized by:   Roderick Pee MD   Signed by:   Roderick Pee MD on 03/05/2010   Method used:   Electronically to        [facility] ([facility]       [address]       [place], [place]  [address]       Ph: [phone] or [phone]       Fax: [phone]   RxID:   [id number] VICODIN ES 7.5-750 MG  TABS (HYDROCODONE-ACETAMINOPHEN) as needed  #30 x 3   Entered and Authorized by:   Roderick Pee MD   Signed by:   Roderick Pee MD on 03/05/2010   Method used:   Print then Give to Patient   RxID:   [id number] AMPHETAMINE-DEXTROAMPHETAMINE 10 MG TABS (AMPHETAMINE-DEXTROAMPHETAMINE) take one tab by mouth daily......[name] fill in two months  #30 x 0   Entered and Authorized by:   Roderick Pee MD   Signed by:   Roderick Pee MD on 03/05/2010   Method used:   Print then Give to Patient   RxID:   [id number] AMPHETAMINE-DEXTROAMPHETAMINE 10 MG TABS (AMPHETAMINE-DEXTROAMPHETAMINE) take one tab by mouth daily........[name] fill in one month  #30 x 0   Entered and Authorized by:   Roderick Pee MD   Signed by:   Roderick Pee MD on 03/05/2010   Method used:   Print then Give to Patient   RxID:   [id number] AMPHETAMINE-DEXTROAMPHETAMINE  10 MG TABS (AMPHETAMINE-DEXTROAMPHETAMINE) Take 1 tablet by mouth every morning  #30 x 0   Entered and Authorized by:   Roderick Pee MD   Signed by:   Roderick Pee MD on 03/05/2010   Method used:   Print then Give to Patient   RxID:   [id number]    Orders Added: 1)  Prescription Created Electronically [G8553] 2)  Est. Patient 40-64 years [99[address]] 3)  Admin 1st Vaccine [90471] 4)  Flu Vaccine 82yrs + [89381] 5)  EKG w/ Interpretation [93000] 6)  Gastroenterology Referral [GI]

## 2010-05-23 NOTE — Letter (Signed)
Summary: Referral - not able to see patient  [facility]  [address] [place], [place] [address]   Phone: [phone]  Fax: [phone]    April 19, 2010   Tinnie Gens A. Tawanna Cooler, M.D. [address] [place], [place] [address]   Re:   Jose Evans DOB:  04-16-1946 MRN:   [id number]    Dear Dr. Tawanna Cooler:  Thank you for your kind referral of the above patient.  We have attempted to schedule the recommended procedure Screening Colonoscopy but have not been able to schedule because:   X  The patient was not available by phone and/or has not returned our calls.  ___ The patient declined to schedule the procedure at this time.  We appreciate the referral and hope that we will have the opportunity to treat this patient in the future.    Sincerely,    [facility] [phone]

## 2010-05-23 NOTE — Letter (Signed)
Summary: Generic Letter  [place] [facility]  [address] [place], [place] [address]   Phone: [phone]  Fax: [phone]    05/01/2010  Jose Evans [address] [place], [place]  [address]  To Whom it May Concern,   Our patient was seen in our office on 03/05/2010 for his yearly physical.  If you have any questions or concerns please feel free to call our office.          Sincerely,   Kelle Darting, MD

## 2010-05-23 NOTE — Progress Notes (Signed)
Summary: letter for CPX  Phone Note Call from Patient Call back at [phone]   Caller: Spouse Summary of Call: wife is calling because the patient needs a note stating that he had a physical in 2011 for insurance reasons. Initial call taken by: Kern Reap CMA Duncan Dull),  May 01, 2010 3:21 PM  Follow-up for Phone Call        letter ready for pick up Follow-up by: Kern Reap CMA Duncan Dull),  May 01, 2010 3:25 PM

## 2010-05-23 NOTE — Miscellaneous (Signed)
Summary: Consent for Lesion Removal  Consent for Lesion Removal   Imported By: Maryln Gottron 05/06/2010 11:01:48  _____________________________________________________________________  External Attachment:    Type:   Image     Comment:   External Document

## 2010-05-23 NOTE — Assessment & Plan Note (Signed)
Summary: lesion removal//ccm   Procedure Note  Mole Biopsy/Removal: Indication: changing lesion Consent signed: yes  Procedure # 1: elliptical incision with 2 mm margin    Size (in cm): 1.0 x 1.0    Region: dorsal    Location: back-upper-right    Instrument used: #15 blade    Anesthesia: 1% lidocaine w/epinephrine    Closure: cautery  Cleaned and prepped with: alcohol Wound dressing: neosporin and bandaid   History of Present Illness: Jose Evans is a 65 year old male, who comes in today for removal of the lesion on his right shoulder.  The lesion has some irregular margins and dark pigmentation.  Allergies: No Known Drug Allergies   Complete Medication List: 1)  Metoprolol Tartrate 50 Mg Tabs (Metoprolol tartrate) .[name].. 1 once daily 2)  Triamterene-hctz 75-50 Mg Tabs (Triamterene-hctz) .[name].. 1 once daily 3)  Vicodin Es 7.5-750 Mg Tabs (Hydrocodone-acetaminophen) .[name].. 1 tab at bedtime as needed 4)  Vicks Formula 44 Cough Relief 30 Mg/62ml Liqd (Dextromethorphan hbr) .... As needed with cough 5)  Allopurinol 300 Mg Tabs (Allopurinol) .... Take 1 tablet by mouth every morning 6)  Diprolene 0.05 % Oint (Betamethasone dipropionate aug) .... Apply at bedtime 7)  Amphetamine-dextroamphetamine 10 Mg Tabs (Amphetamine-dextroamphetamine) .... Take 1 tablet by mouth every morning 8)  Amphetamine-dextroamphetamine 10 Mg Tabs (Amphetamine-dextroamphetamine) .... Take one tab by mouth daily........[name] fill in one month 9)  Amphetamine-dextroamphetamine 10 Mg Tabs (Amphetamine-dextroamphetamine) .... Take one tab by mouth daily......[name] fill in two months 10)  Propecia 1 Mg Tabs (Finasteride) .... Take 1 tablet by mouth every morning 11)  Synthroid 125 Mcg Tabs (Levothyroxine sodium) .... Take 1 tablet by mouth every morning  Other Orders: Shave Skin Lesion 0.6-1.0 cm/trunk/arm/leg (11301)   Orders Added: 1)  Shave Skin Lesion 0.6-1.0 cm/trunk/arm/leg [11301]

## 2010-05-29 NOTE — Progress Notes (Signed)
Summary: rx synthroid  Phone Note Refill Request Message from:  Patient on May 21, 2010 5:22 PM  Refills Requested: Medication #1:  SYNTHROID 125 MCG TABS Take 1 tablet by mouth every morning. Initial call taken by: Kern Reap CMA (AAMA),  May 21, 2010 5:22 PM    New/Updated Medications: SYNTHROID 150 MCG TABS (LEVOTHYROXINE SODIUM) take one tab by mouth once daily Prescriptions: SYNTHROID 150 MCG TABS (LEVOTHYROXINE SODIUM) take one tab by mouth once daily  #90 x 3   Entered by:   Kern Reap CMA (AAMA)   Authorized by:   Roderick Pee MD   Signed by:   Kern Reap CMA (AAMA) on 05/21/2010   Method used:   Electronically to        [id number] ([facility]       [address]       [place], [place]  [address]       Ph: [phone] or [phone]       Fax: [phone]   RxID:   [id number]

## 2010-05-29 NOTE — Progress Notes (Signed)
Summary: Pt says Synthroid 125mg  is not working. Need diff dose  Phone Note Call from Patient Call back at [facility] Phone [phone]   Caller: Patient Summary of Call: Pt called and said that Synthroid 125mg  dose is not working. Pt says that he has hair loss, puffy watery eyes, red patches on face. Pt is wondering if there is a diff dose he could try?  Initial call taken by: Lucy Antigua,  May 16, 2010 3:43 PM  Follow-up for Phone Call        Fleet Contras please call had him come in for a TSH level T4 and T3.  Today, nonfasting Follow-up by: Roderick Pee MD,  May 17, 2010 12:05 PM  Additional Follow-up for Phone Call Additional follow up Details #1::        tried to call but could not leave message at phone number Additional Follow-up by: Kern Reap CMA Duncan Dull),  May 17, 2010 4:09 PM    Additional Follow-up for Phone Call Additional follow up Details #2::    patient is coming in today for labs today.  he is also cutting his tabs to get to .  he is feeling better. Follow-up by: Kern Reap CMA Duncan Dull),  May 20, 2010 9:44 AM

## 2010-07-18 ENCOUNTER — Other Ambulatory Visit: Payer: Self-pay | Admitting: Family Medicine

## 2010-07-18 NOTE — Telephone Encounter (Signed)
Pt called req script for Hydrocodone 7.5 -750mg . Pls call pt when ready for pick up.

## 2010-07-18 NOTE — Telephone Encounter (Signed)
Vicodin ES dispense 30 tabs directions one half to one tablet at bed time p.r.n. For pain, refills x 2

## 2010-07-19 MED ORDER — HYDROCODONE-ACETAMINOPHEN 7.5-750 MG PO TABS: 1.0000 | ORAL_TABLET | Freq: Four times a day (QID) | ORAL | Status: AC | PRN

## 2010-07-19 NOTE — Telephone Encounter (Signed)
rx called in and patient is aware 

## 2010-08-07 ENCOUNTER — Telehealth: Payer: Self-pay | Admitting: Family Medicine

## 2010-08-07 MED ORDER — AMPHETAMINE-DEXTROAMPHETAMINE 10 MG PO TABS: 10.0000 mg | ORAL_TABLET | Freq: Every day | ORAL | Status: DC

## 2010-08-07 NOTE — Telephone Encounter (Signed)
Pt req script for generic Adderall 10 mg tabs. Need another 4 month supply.

## 2010-08-07 NOTE — Telephone Encounter (Signed)
rx ready for pick up.  patient  Is aware 

## 2010-09-06 NOTE — H&P (Signed)
[place]  Patient:    JAMISON, SOWARD Visit Number: [id number] MRN: [id number]          Service Type: Attending:  Evette Georges, M.D. [facility] Dictated by:   Evette Georges, M.D. [facility] Adm. Date:  02/02/01                           History and Physical  PRIMARY CARE PHYSICIAN:  Evette Georges, M.D.  ADMITTING PHYSICIAN:  Evette Georges, M.D.  HOSPITAL PHYSICIAN:  Justine Null, M.D.  HISTORY OF PRESENT ILLNESS:  This is the first [facility] admission for this 65 year old married male who comes into the office and subsequently admitted to the hospital for evaluation of atrial fibrillation, new-onset.  Patient states he felt well until about five or six days ago.  At that time he complained that he started being fatigued with sweating all the time, had some dullness and aching in his chest, his hands were shaking, and he felt light-headed.  He has not had any syncopal episodes.  He does not recall that it started abruptly.  He came to the office with that complaint today, was found to be in atrial fibrillation, and was admitted to the hospital for evaluation and treatment.  The patient never had any cardiac arrhythmias in the past.  He is otherwise been in good health.  His cardiac review of systems is negative except he complained of some burning, pressure-type pain last summer when he was mowing the grass.  He described it as a pressure sensation, would go up into his shoulders, down both elbows.  He would feel weak and a little short of breath but he did not have any diaphoresis, although he was mowing the yard and he really could not tell much about the diaphoresis because he was sweating anyway.  He says he went into the house, he rested, and then it got better. He does not recall that this episode that he is having now was precipitated by any chest pain.  PAST MEDICAL HISTORY:  He has had a hospitalization  previously for left hernia repair.  PAST ILLNESSES:  None.  PAST INJURIES:  None.  DRUG ALLERGIES:  None.  HABITS:  He does not drink any alcohol and he quit smoking in the summer 2002.  CURRENT MEDICATIONS: 1. Zestril 5 q.d. 2. Maxzide 50 q.d. 3. Aspirin one q.d.  WORK HISTORY:  He works for [employer].  REVIEW OF SYSTEMS:  His weight has been steady.  HEENT:  Pertinent that he wears contacts for distance vision.  He also gets regular dental care.  He was first diagnosed to have hypertension in 1992.  He is on Zestril and Maxzide as noted above with good control.  PULMONARY:  Negative.  GI:  Negative.  GU: Negative.  MUSCULOSKELETAL:  Negative.  ENDO:  Negative.  SOCIAL HISTORY:  He is married for the second time.  He has two step-daughters by his second marriage.  He works for [employer], as noted above. He has four children by his first marriage.  His hobbies include riding his bike and exercising.  FAMILY HISTORY:  Dad was an ophthalmologist, died at age 63 of an MI and CA of the prostate.  Mother is 82, alive and well.  Two brothers - one died of congenital heart disease at age 83, the other is in his late 61s and also has hypertension.  VACCINATION  HISTORY:  Last tetanus booster was 40.  PHYSICAL EXAMINATION:  VITAL SIGNS:  Temperature 97.2, pulse 150 and irregularly irregular, respirations 12 and regular, blood pressure 90/60.  GENERAL:  He is a well-developed muscular male but he was diaphoretic.  HEENT:  Negative.  NECK:  Supple and the thyromegaly is not enlarged.  There is no carotid bruit.  CHEST:  Clear to auscultation.  CARDIAC:  The rate is irregularly irregular.  There is a murmur and I think it is because of the atrial fibrillation - it is very difficult to describe.  It does not sound like an MR murmur.  ABDOMEN:  Negative.  EXTREMITIES:  Normal.  SKIN:  Normal.  PERIPHERAL PULSES:  Normal, no edema.  LABORATORY DATA:   EKG shows atrial fibrillation with a rapid ventricular response of 150.  IMPRESSION: 1. New-onset atrial fibrillation. 2. History of hypertension. 3. History of tobacco abuse - quit in the summer 2002.  PLAN:  Admit.  Telemetry.  IV Cardizem.  Heparin and Coumadin to prevent blood clots.  Also get an echo and we must consider the fact that he might have underlying coronary artery disease.  Therefore, that will be evaluated when he is stable. Dictated by:   Evette Georges, M.D. [facility] Attending:  Evette Georges, M.D. [facility] DD:  02/02/01 TD:  02/02/01 Job: [id number]

## 2010-09-06 NOTE — Discharge Summary (Signed)
[place]. [facility]  Patient:    Jose Evans, Jose Evans Visit Number: [id number] MRN: [id number]          Service Type: MED Location: [id number] Attending Physician:  Evette Georges Dictated by:   Cornell Barman, P.A. Admit Date:  02/02/2001 Discharge Date: 02/05/2001   CC:         Evette Georges, M.D. [facility]   Discharge Summary  DISCHARGE DIAGNOSES: 1. Hypothyroidism. 2. Atrial fibrillation with sinus tachycardia. 3. Anticoagulation.  HISTORY OF PRESENT ILLNESS:  Jose Evans is a 65 year old, white male who presented with new-onset atrial fibrillation.  The patient complained of weakness, constant sweating and lightheadedness with no syncope.  PAST MEDICAL HISTORY:  Status post left inguinal hernia repair.  HOSPITAL COURSE:  #1 - CARDIOVASCULAR:  The patient was admitted with new-onset atrial fibrillation.  The patient also had had some presyncopal symptoms.  Cardiology was asked to evaluate.  There was a question initially on the EKG of an old inferior MI.  Cardiology saw the patient and recommended obtaining a 2D echocardiogram and initially was considering catheterization. They did go ahead and initiate heparin as well.  Part of the patients routine work-up was with TSH obtained and the patient was found to be markedly hypothyroid.  A 2D echocardiogram was hyperdynamic, but otherwise normal. There was no need for a catheterization at this time.  However, Dr. Eden Evans was adamant that the patient be anticoagulated prior to discharge.  #2 - HYPOTHYROIDISM:  The patients TSH was less than 0.019 with a FT4 of 24.9.  The patient underwent radioactive Iodine.  At that point, he was discharged home with outpatient followup.  #3 - ANTICOAGULATION:  The patient was anticoagulated with heparin and Coumadin.  DISCHARGE LABORATORY DATA AND X-RAY FINDINGS:  A 2D echocardiogram revealed left ventricular size to be normal with overall left ventricular  systolic function to be vigorous.  Hemoglobin 11.5.  BMET normal except for an ALT of 59 and a total bilirubin of 1.3.  FT4 24.9, TSH was less than 0.019.  DISCHARGE MEDICATIONS: 1. Coumadin 5 mg 1-1/2 tablets per day. 2. Lopressor 50 mg b.i.d.  FOLLOWUP:  Follow up at Dr. Barbette Evans office on Monday, October 21, for protime. Followup with Dr. Tawanna Evans in one to two weeks.  He is also scheduled to see Dr. Eden Evans on February 24, 2001, at 2 p.m. Dictated by:   Cornell Barman, P.A.  Attending Physician:  Evette Georges DD:  03/04/01 TD:  03/04/01 Job: [id number]

## 2010-09-06 NOTE — Op Note (Signed)
Jose Evans, [name]                           ACCOUNT NO.:  [id number]   MEDICAL RECORD NO.:  [id number]                   PATIENT TYPE:  AMB   LOCATION:  DSC                                  FACILITY:  [facility]   PHYSICIAN:  Jimmye Norman III, M.D.               DATE OF BIRTH:  [birth year]   DATE OF PROCEDURE:  02/16/2003  DATE OF DISCHARGE:                                 OPERATIVE REPORT   PREOPERATIVE DIAGNOSES:  Right inguinal hernia.   POSTOPERATIVE DIAGNOSES:  Right direct and indirect inguinal hernia.   OPERATION PERFORMED:  Right inguinal herniorrhaphy with mesh.   SURGEON:  Marta Lamas. Lindie Spruce, M.D.   ASSISTANT:  None.   ANESTHESIA:  General laryngeal airway.   ESTIMATED BLOOD LOSS:  Less than 20mL.   COMPLICATIONS:  None.   CONDITION:  Stable.   INDICATIONS FOR PROCEDURE:  The patient is a 65 year old male with a right  inguinal hernia which was symptomatic, who now comes in for repair.   OPERATIVE FINDINGS:  The patient had a moderate-sized indirect hernia along  with a small direct defect which was covered and reinforced with mesh.   DESCRIPTION OF PROCEDURE:  The patient was taken to the operating room and  placed on the table in the supine position.  After an adequate general  laryngeal airway anesthetic was administered, the patient was prepped and  draped in the usual sterile manner exposing the right groin.   A 6 to 6.5cm curvilinear incision was made at the level of the superficial  ring and was taken down into the subcu using electrocautery.  Small veins in  the subcu were ligated with 3-0 Vicryl.  We dissected down to the external  oblique fascia which was subsequently split along its fibers through the  superficial ring.  This exposed the spermatic cord which was subsequently  mobilized at the pubic tubercle with the Penrose drain.   Once we had the cord mobilized, we placed it up on a work bench and then  dissected off the indirect sac which went along  with the cord in the  anteromedial aspect.  A small lipoma in the cord was suture ligated at its  base and resected.  The hernia sac was resected at its base after twisting  in on itself with two suture ligatures of 0 Ethibond suture.  We  subsequently resected the sac.   We then repaired the floor using an oval piece of mesh measuring  approximately 5 x 3 cm in size attaching it to the pubic tubercle area to  the reflected portion of the inguinal ligament and the conjoined tendon  anteromedially.  Once this was done, we closed it off, irrigated with  antibiotic solution and then closed.  The external oblique fascia was fascia  was closed on top of the cord using running 3-0 Vicryl suture.  The  ilioinguinal nerve was found to  be coming out of the superficial ring in a  manner that would entrap it with repair and therefore, we cut it off at its  base.  Once we had closed the fascia, we closed Scarpa's fascia using three  interrupted 3-0 Vicryl sutures and the skin was closed using running  subcuticular suture of 4-0 Prolene.  Sterile dressings were applied along  with Steri-Strips and antibiotic ointment and OpSite.  We injected into the  subcu with 0.5% Marcaine without epinephrine total of 26mL used.  We also  placed antibiotic solution in the canal.  Sponge, needle and instrument  counts were correct.                                                 Kathrin Ruddy, M.D.    JW/MEDQ  D:  02/16/2003  T:  02/16/2003  Job:  [id number]

## 2010-09-23 ENCOUNTER — Other Ambulatory Visit: Payer: Self-pay | Admitting: *Deleted

## 2010-09-23 MED ORDER — FINASTERIDE 1 MG PO TABS: 5.0000 mg | ORAL_TABLET | Freq: Every day | ORAL | Status: DC

## 2010-09-25 ENCOUNTER — Other Ambulatory Visit: Payer: Self-pay | Admitting: *Deleted

## 2010-09-25 MED ORDER — FINASTERIDE 5 MG PO TABS: 5.0000 mg | ORAL_TABLET | Freq: Every day | ORAL | Status: DC

## 2010-11-22 ENCOUNTER — Other Ambulatory Visit: Payer: Self-pay | Admitting: *Deleted

## 2010-11-22 NOTE — Telephone Encounter (Signed)
patient  Would like a refill of his Adderall and pain medication for gout.  Is this okay to fill?

## 2010-11-25 MED ORDER — AMPHETAMINE-DEXTROAMPHETAMINE 10 MG PO TABS: 10.0000 mg | ORAL_TABLET | Freq: Every day | ORAL | Status: DC

## 2010-11-25 MED ORDER — ALLOPURINOL 300 MG PO TABS: 300.0000 mg | ORAL_TABLET | Freq: Every day | ORAL | Status: DC

## 2010-11-25 NOTE — Telephone Encounter (Signed)
ok 

## 2010-11-25 NOTE — Telephone Encounter (Signed)
Jose Evans, the patient just called to see if his Rx's were ready for pick up. I told him not yet. Please call him at [phone] when they are ready. He is leaving town this afternoon so was just checking to see if he could come over. Thanks!

## 2010-11-25 NOTE — Telephone Encounter (Signed)
rx ready for pick up and patient is aware  

## 2010-11-28 ENCOUNTER — Telehealth: Payer: Self-pay | Admitting: *Deleted

## 2010-11-28 NOTE — Telephone Encounter (Signed)
patient  Is requesting Vicodin ES for gout.  Is this okay to fill?

## 2010-11-28 NOTE — Telephone Encounter (Signed)
Vicodin ES dispense 30 tenths, directions one half to one tablet q.4 h. P.r.n. For pain.  He needs to come in for a uric acid level to assess his gout

## 2010-11-29 ENCOUNTER — Telehealth: Payer: Self-pay | Admitting: Family Medicine

## 2010-11-29 MED ORDER — HYDROCODONE-ACETAMINOPHEN 7.5-750 MG PO TABS: 1.0000 | ORAL_TABLET | Freq: Four times a day (QID) | ORAL | Status: AC | PRN

## 2010-11-29 NOTE — Telephone Encounter (Signed)
Pt wife call to check on status of vicodin. Pt is going out of town for work on Monday and is in a lot of pain was hoping they could get prescription today.

## 2010-11-29 NOTE — Telephone Encounter (Signed)
rx called in.  Tried to call but the line is busy.

## 2011-02-20 ENCOUNTER — Other Ambulatory Visit: Payer: Self-pay | Admitting: *Deleted

## 2011-02-20 MED ORDER — TRIAMCINOLONE ACETONIDE 0.05 % EX OINT: 1.0000 g | TOPICAL_OINTMENT | Freq: Two times a day (BID) | CUTANEOUS | Status: DC

## 2011-02-24 ENCOUNTER — Other Ambulatory Visit: Payer: Self-pay | Admitting: Family Medicine

## 2011-02-24 NOTE — Telephone Encounter (Signed)
ok 

## 2011-02-24 NOTE — Telephone Encounter (Signed)
Pt need new rx generic adderall 10 mg °

## 2011-02-26 ENCOUNTER — Telehealth: Payer: Self-pay | Admitting: *Deleted

## 2011-02-26 MED ORDER — AMPHETAMINE-DEXTROAMPHETAMINE 10 MG PO TABS: 10.0000 mg | ORAL_TABLET | Freq: Every day | ORAL | Status: DC

## 2011-02-26 NOTE — Telephone Encounter (Signed)
Opened in error

## 2011-02-26 NOTE — Telephone Encounter (Signed)
rx ready for pickup 

## 2011-02-26 NOTE — Telephone Encounter (Signed)
Pt is aware rx has been approved.

## 2011-03-20 ENCOUNTER — Other Ambulatory Visit: Payer: Self-pay | Admitting: Family Medicine

## 2011-04-10 ENCOUNTER — Telehealth: Payer: Self-pay | Admitting: Family Medicine

## 2011-04-10 MED ORDER — HYDROCODONE-ACETAMINOPHEN 7.5-750 MG PO TABS: 1.0000 | ORAL_TABLET | Freq: Four times a day (QID) | ORAL | Status: DC | PRN

## 2011-04-10 NOTE — Telephone Encounter (Signed)
rx ready for pick up. Left message on machine for patient. 

## 2011-04-10 NOTE — Telephone Encounter (Signed)
Okay to refill the Vicodin give him 20 tablets no refills.  He should be taking the allopurinol on a daily basis and if he is he should not be having any gouty attacks?????????

## 2011-04-10 NOTE — Telephone Encounter (Signed)
Pt is having a gout flare up and is requesting a refill on HYDROcodone-acetaminophen (VICODIN ES) 7.5-750 MG per tablet Please contact pt when refill is complete

## 2011-04-13 ENCOUNTER — Other Ambulatory Visit: Payer: Self-pay | Admitting: Family Medicine

## 2011-04-14 ENCOUNTER — Telehealth: Payer: Self-pay

## 2011-04-14 NOTE — Telephone Encounter (Signed)
[facility] Triage Call Report Triage Record Num: [id number] Operator: Tana Felts Patient Name: Jose Evans Call Date & Time: 04/13/2011 9:00:57AM Patient Phone: [phone] PCP: Eugenio Hoes. Todd Patient Gender: Male PCP Fax : [phone] Patient DOB: 09/10/1945 Practice Name: [facility] Reason for Call: Caller: [name]/Patient; PCP: Roderick Pee.; CB#: [phone]; Call regarding Gout flare up in great toe on Left foot. Toe is swollen, red, numbness, burning sensation present. Dr. Tawanna Cooler called in pain medication for him but he is out of Allopurinol. Called MD oncall Dr. Patsy Lager, advised not to take Allopurinol and to take Indomethacin 50 mg Take 1 po TID until gout flare resolves. #30 No refill. [phone], spoke with Annette Stable, [facility]. Advised to follow up with office regarding Allopurinol RX. Protocol(s) Used: Foot Non-Injury Recommended Outcome per Protocol: See Provider within 24 hours Reason for Outcome: New onset mild to moderate pain that has not improved with 24 hours of home care Care Advice: ~

## 2011-04-16 ENCOUNTER — Other Ambulatory Visit: Payer: Self-pay | Admitting: Family Medicine

## 2011-05-15 ENCOUNTER — Telehealth: Payer: Self-pay | Admitting: *Deleted

## 2011-05-15 NOTE — Telephone Encounter (Signed)
Wife is asking for Colchicine and Indocin for pt's gout.

## 2011-05-16 ENCOUNTER — Other Ambulatory Visit: Payer: Self-pay | Admitting: Family Medicine

## 2011-05-16 DIAGNOSIS — M109 Gout, unspecified: Secondary | ICD-10-CM

## 2011-05-16 MED ORDER — INDOMETHACIN 50 MG PO CAPS: ORAL_CAPSULE | ORAL | Status: DC

## 2011-05-16 MED ORDER — ALLOPURINOL 300 MG PO TABS: 300.0000 mg | ORAL_TABLET | Freq: Every day | ORAL | Status: DC

## 2011-05-16 NOTE — Telephone Encounter (Signed)
Wife Is calling to checking on status of refill. Thanks.

## 2011-05-19 MED ORDER — INDOMETHACIN 50 MG PO CAPS: 50.0000 mg | ORAL_CAPSULE | Freq: Three times a day (TID) | ORAL | Status: DC

## 2011-05-19 MED ORDER — COLCHICINE 0.6 MG PO TABS: 0.6000 mg | ORAL_TABLET | Freq: Every day | ORAL | Status: DC

## 2011-05-22 ENCOUNTER — Other Ambulatory Visit: Payer: Self-pay

## 2011-05-29 ENCOUNTER — Encounter: Payer: Self-pay | Admitting: Family Medicine

## 2011-06-03 ENCOUNTER — Other Ambulatory Visit: Payer: Self-pay | Admitting: Family Medicine

## 2011-06-05 ENCOUNTER — Other Ambulatory Visit: Payer: Self-pay

## 2011-06-05 ENCOUNTER — Other Ambulatory Visit: Payer: Self-pay | Admitting: Family Medicine

## 2011-06-05 MED ORDER — DOXYCYCLINE HYCLATE 100 MG PO CAPS: 100.0000 mg | ORAL_CAPSULE | Freq: Two times a day (BID) | ORAL | Status: DC

## 2011-06-05 NOTE — Telephone Encounter (Signed)
Pt would like refill on doxycyline [facility] [address]. Pt has another break out on face.

## 2011-06-05 NOTE — Telephone Encounter (Signed)
Doxycycline 100 mg #60 directions one by mouth twice a day refills x2

## 2011-06-06 ENCOUNTER — Other Ambulatory Visit (INDEPENDENT_AMBULATORY_CARE_PROVIDER_SITE_OTHER): Payer: BC Managed Care – PPO

## 2011-06-06 DIAGNOSIS — Z0000 Encounter for general adult medical examination without abnormal findings: Secondary | ICD-10-CM

## 2011-06-06 LAB — LIPID PANEL
Cholesterol: 183 mg/dL (ref 0–200)
HDL: 42.6 mg/dL (ref >39.00)
LDL Cholesterol: 110 mg/dL — ABNORMAL HIGH (ref 0–99)
Total CHOL/HDL Ratio: 4
Triglycerides: 153 mg/dL — ABNORMAL HIGH (ref 0.0–149.0)

## 2011-06-06 LAB — HEPATIC FUNCTION PANEL
Alkaline Phosphatase: 55 U/L (ref 39–117)
Bilirubin, Direct: 0.2 mg/dL (ref 0.0–0.3)
Total Bilirubin: 1.2 mg/dL (ref 0.3–1.2)
Total Protein: 6.7 g/dL (ref 6.0–8.3)

## 2011-06-06 LAB — POCT URINALYSIS DIPSTICK
Ketones, UA: NEGATIVE
Protein, UA: NEGATIVE
Spec Grav, UA: 1.02

## 2011-06-06 LAB — CBC WITH DIFFERENTIAL/PLATELET
Basophils Absolute: 0.1 10*3/uL (ref 0.0–0.1)
Eosinophils Absolute: 0.1 10*3/uL (ref 0.0–0.7)
Lymphocytes Relative: 16.9 % (ref 12.0–46.0)
MCHC: 34.5 g/dL (ref 30.0–36.0)
MCV: 91.4 fl (ref 78.0–100.0)
Monocytes Absolute: 0.5 10*3/uL (ref 0.1–1.0)
Neutrophils Relative %: 73.3 % (ref 43.0–77.0)
Platelets: 195 10*3/uL (ref 150.0–400.0)
RBC: 4.83 Mil/uL (ref 4.22–5.81)
RDW: 14 % (ref 11.5–14.6)

## 2011-06-06 LAB — BASIC METABOLIC PANEL
BUN: 29 mg/dL — ABNORMAL HIGH (ref 6–23)
CO2: 27 mEq/L (ref 19–32)
Calcium: 9.5 mg/dL (ref 8.4–10.5)
Chloride: 103 mEq/L (ref 96–112)
Creatinine, Ser: 1.1 mg/dL (ref 0.4–1.5)

## 2011-06-06 LAB — URIC ACID: Uric Acid, Serum: 10 mg/dL — ABNORMAL HIGH (ref 4.0–7.8)

## 2011-06-13 ENCOUNTER — Encounter: Payer: Self-pay | Admitting: Family Medicine

## 2011-06-16 ENCOUNTER — Encounter: Payer: Self-pay | Admitting: Family Medicine

## 2011-06-16 ENCOUNTER — Ambulatory Visit (INDEPENDENT_AMBULATORY_CARE_PROVIDER_SITE_OTHER): Payer: BC Managed Care – PPO | Admitting: Family Medicine

## 2011-06-16 DIAGNOSIS — M109 Gout, unspecified: Secondary | ICD-10-CM

## 2011-06-16 DIAGNOSIS — Z23 Encounter for immunization: Secondary | ICD-10-CM

## 2011-06-16 DIAGNOSIS — E039 Hypothyroidism, unspecified: Secondary | ICD-10-CM

## 2011-06-16 DIAGNOSIS — L719 Rosacea, unspecified: Secondary | ICD-10-CM

## 2011-06-16 DIAGNOSIS — I10 Essential (primary) hypertension: Secondary | ICD-10-CM

## 2011-06-16 DIAGNOSIS — F988 Other specified behavioral and emotional disorders with onset usually occurring in childhood and adolescence: Secondary | ICD-10-CM

## 2011-06-16 DIAGNOSIS — L659 Nonscarring hair loss, unspecified: Secondary | ICD-10-CM

## 2011-06-16 DIAGNOSIS — Z0000 Encounter for general adult medical examination without abnormal findings: Secondary | ICD-10-CM

## 2011-06-16 MED ORDER — METOPROLOL TARTRATE 50 MG PO TABS: 50.0000 mg | ORAL_TABLET | ORAL | Status: DC

## 2011-06-16 MED ORDER — DOXYCYCLINE HYCLATE 100 MG PO CAPS: 100.0000 mg | ORAL_CAPSULE | Freq: Two times a day (BID) | ORAL | Status: DC

## 2011-06-16 MED ORDER — LEVOTHYROXINE SODIUM 150 MCG PO TABS: 150.0000 ug | ORAL_TABLET | Freq: Every day | ORAL | Status: DC

## 2011-06-16 MED ORDER — ALLOPURINOL 300 MG PO TABS: 300.0000 mg | ORAL_TABLET | Freq: Every day | ORAL | Status: DC

## 2011-06-16 MED ORDER — PREDNISONE 20 MG PO TABS: ORAL_TABLET | ORAL | Status: DC

## 2011-06-16 MED ORDER — FINASTERIDE 5 MG PO TABS: 5.0000 mg | ORAL_TABLET | Freq: Every day | ORAL | Status: DC

## 2011-06-16 MED ORDER — TRIAMTERENE-HCTZ 75-50 MG PO TABS: 1.0000 | ORAL_TABLET | Freq: Every day | ORAL | Status: DC

## 2011-06-16 MED ORDER — AMPHETAMINE-DEXTROAMPHETAMINE 10 MG PO TABS: 10.0000 mg | ORAL_TABLET | Freq: Every day | ORAL | Status: DC

## 2011-06-16 MED ORDER — HYDROCODONE-ACETAMINOPHEN 7.5-750 MG PO TABS: 1.0000 | ORAL_TABLET | Freq: Four times a day (QID) | ORAL | Status: AC | PRN

## 2011-06-16 NOTE — Progress Notes (Signed)
Subjective:    Patient ID: Jose Evans, male    DOB: 12-24-1945, 66 y.o.   MRN: [id number]  HPI Jose Evans is a 66 year old married male nonsmoker who comes in today for yearly evaluation of hyperuricemia, adult ADD, acne rosacea, hair loss, hypertension, hypothyroidism  His medication list reviewed in detail and there have been no changes. He has stopped his allopurinol to see what his uric acid level would do his uric acid level is now 10. He thinks is something else wrong with his foot besides the gout. I will refer him to Dr. Toni Arthurs for further evaluation  He has been out of his Adderall now for 10 days and would like his medicine refilled. He takes the doxycycline 100 mg twice a day when necessary for rosacea and indomethacin when necessary if he has a flare of his gout however one prednisone tablet typically would decrease his gouty attack.  Tetanus booster unknown   Review of Systems  Constitutional: Negative.   HENT: Negative.   Eyes: Negative.   Respiratory: Negative.   Cardiovascular: Negative.   Gastrointestinal: Negative.   Genitourinary: Negative.   Musculoskeletal: Negative.   Skin: Negative.   Neurological: Negative.   Hematological: Negative.   Psychiatric/Behavioral: Negative.        Objective:   Physical Exam  Constitutional: He is oriented to person, place, and time. He appears well-developed and well-nourished.  HENT:  Head: Normocephalic and atraumatic.  Right Ear: External ear normal.  Left Ear: External ear normal.  Nose: Nose normal.  Mouth/Throat: Oropharynx is clear and moist.  Eyes: Conjunctivae and EOM are normal. Pupils are equal, round, and reactive to light.  Neck: Normal range of motion. Neck supple. No JVD present. No tracheal deviation present. No thyromegaly present.  Cardiovascular: Normal rate, regular rhythm, normal heart sounds and intact distal pulses.  Exam reveals no gallop and no friction rub.   No murmur heard. Pulmonary/Chest:  Effort normal and breath sounds normal. No stridor. No respiratory distress. He has no wheezes. He has no rales. He exhibits no tenderness.  Abdominal: Soft. Bowel sounds are normal. He exhibits no distension and no mass. There is no tenderness. There is no rebound and no guarding.  Genitourinary: Rectum normal, prostate normal and penis normal. Guaiac negative stool. No penile tenderness.  Musculoskeletal: Normal range of motion. He exhibits no edema and no tenderness.  Lymphadenopathy:    He has no cervical adenopathy.  Neurological: He is alert and oriented to person, place, and time. He has normal reflexes. No cranial nerve deficit. He exhibits normal muscle tone.  Skin: Skin is warm and dry. No rash noted. No erythema. No pallor.       Total body skin exam normal except for some evidence of chronic sun damage on his face also a red lesion left cheek that is nonhealing for a year and a half advised to return for removal ASAP  Psychiatric: He has a normal mood and affect. His behavior is normal. Judgment and thought content normal.   Scar tissue right-hand the fourth flexor tendon consistent with Dupuytren's contracture       Assessment & Plan:  Healthy male  Hyperuricemia with symptoms of gout again recommended he take the allopurinol 300 mg daily  Adult ADD continue Adderall 10 mg daily  History of rosacea doxycycline when necessary  Hair loss Propecia 1 mg daily  Hypertension continue Lopressor 50 mg daily  Hypothyroidism continue Synthroid 150 mg daily  Refer to Dr. Toni Arthurs for  evaluation of his foot because he thinks or something else going on.  Dupuytren's contracture right hand observe

## 2011-06-16 NOTE — Patient Instructions (Signed)
We start the allopurinol,,,,,,,,,,,,  Take one daily forever  Call Dr. Toni Arthurs orthopedist for consult about y  foot  Return March 16 for removal of the lesion on the left side of your face  When you restart the allopurinol it may trigger a gouty attack take the prednisone as directed for couple days

## 2011-07-01 ENCOUNTER — Ambulatory Visit (INDEPENDENT_AMBULATORY_CARE_PROVIDER_SITE_OTHER): Payer: BC Managed Care – PPO | Admitting: Family Medicine

## 2011-07-01 ENCOUNTER — Encounter: Payer: Self-pay | Admitting: Family Medicine

## 2011-07-01 DIAGNOSIS — D2330 Other benign neoplasm of skin of unspecified part of face: Secondary | ICD-10-CM

## 2011-07-01 NOTE — Progress Notes (Signed)
  Subjective:    Patient ID: Jose Evans, male    DOB: 1945/07/03, 66 y.o.   MRN: [id number]  HPI  Jose Evans is a 66 year old male who comes in today for removal of a lesion on the left side of his face is red and irritated  The lesion measures 10 mm x 10 mm  After informed consent the lesion was anesthetized with 1% Xylocaine with epinephrine it was excised with 2 mm margins the base was cauterized the lesion was sent for pathologic analysis dry Band-Aid was applied. He tolerated the procedure no complication clinically appears to be a dysplastic nevus rule out basal cell or squamous cell carcinoma  Review of Systems    general and dermatologic review of systems otherwise negative we have removed other lesions they've been dysplastic no cancers Objective:   Physical Exam  Procedure see above      Assessment & Plan:  Probable dysplastic nevus rule out basal cell or squamous cell carcinoma path pending

## 2011-07-01 NOTE — Patient Instructions (Signed)
Remove the Band-Aid tomorrow leave it open to the air  Within 2 weeks I will call you with did report

## 2011-10-06 ENCOUNTER — Other Ambulatory Visit: Payer: Self-pay | Admitting: Family Medicine

## 2011-10-06 DIAGNOSIS — F988 Other specified behavioral and emotional disorders with onset usually occurring in childhood and adolescence: Secondary | ICD-10-CM

## 2011-10-06 MED ORDER — AMPHETAMINE-DEXTROAMPHETAMINE 10 MG PO TABS: 10.0000 mg | ORAL_TABLET | Freq: Every day | ORAL | Status: DC

## 2011-10-06 NOTE — Telephone Encounter (Signed)
Pt needs new rx generic adderall 10 mg. Pt needs rx today. Pt will be out of town tomorrow

## 2011-10-07 NOTE — Telephone Encounter (Signed)
Rx ready for pick up and patient is aware 

## 2011-10-19 ENCOUNTER — Other Ambulatory Visit: Payer: Self-pay | Admitting: Family Medicine

## 2011-12-10 ENCOUNTER — Other Ambulatory Visit: Payer: Self-pay | Admitting: Family Medicine

## 2011-12-10 NOTE — Telephone Encounter (Signed)
Pt needs new rx generic adderall 10mg  and hydrocodone for gout LR 05-2011. [facility] [address]. Pt is out of generic adderall 10 mg

## 2011-12-11 NOTE — Telephone Encounter (Signed)
Ok 1 month; needs rob Dr Tawanna Cooler

## 2011-12-11 NOTE — Telephone Encounter (Signed)
Patient called to check status of his refill as he works out of town and will need this.

## 2011-12-11 NOTE — Telephone Encounter (Signed)
Okay to fill hydrocodone?

## 2011-12-12 MED ORDER — HYDROCODONE-ACETAMINOPHEN 7.5-750 MG PO TABS: 1.0000 | ORAL_TABLET | Freq: Four times a day (QID) | ORAL | Status: AC | PRN

## 2011-12-12 NOTE — Telephone Encounter (Signed)
Rx called in for Vicodin.  Too early for Adderall refill. 01/06/12.

## 2011-12-24 ENCOUNTER — Encounter: Payer: Self-pay | Admitting: Family Medicine

## 2011-12-24 ENCOUNTER — Ambulatory Visit (INDEPENDENT_AMBULATORY_CARE_PROVIDER_SITE_OTHER): Payer: BC Managed Care – PPO | Admitting: Family Medicine

## 2011-12-24 VITALS — BP 110/80 | Temp 98.1°F | Wt 207.0 lb

## 2011-12-24 DIAGNOSIS — F988 Other specified behavioral and emotional disorders with onset usually occurring in childhood and adolescence: Secondary | ICD-10-CM

## 2011-12-24 DIAGNOSIS — S60459A Superficial foreign body of unspecified finger, initial encounter: Secondary | ICD-10-CM

## 2011-12-24 MED ORDER — AMPHETAMINE-DEXTROAMPHETAMINE 10 MG PO TABS: 10.0000 mg | ORAL_TABLET | Freq: Every day | ORAL | Status: DC

## 2011-12-24 MED ORDER — TRAMADOL HCL 50 MG PO TABS: 50.0000 mg | ORAL_TABLET | Freq: Three times a day (TID) | ORAL | Status: AC | PRN

## 2011-12-24 NOTE — Progress Notes (Signed)
  Subjective:    Patient ID: Jose Evans, male    DOB: [birth year], 66 y.o.   MRN: [id number]  HPI Jose Evans comes in for evaluation of 2 problems  He's had soreness in his right and left shoulder for many years and seems to be getting worse. He's taking 400 mg of Motrin with food and it doesn't seem to help much. He'll occasionally take a 20 mg prednisone which seems to help a lot. But he knows he can't take it on a daily basis.  He also has a foreign body in his right index finger he would like removed its a splinter  He also needs a refill his Adderall 10 mg daily   Review of Systems    general orthopedic review of systems otherwise negative Objective:   Physical Exam Well-developed well nourished male no acute distress examination of both shoulders the shoulders appear normal in the sitting position no edema no redness full range of motion no palpable tenderness.  The right index finger shows a granuloma which was excised no foreign body was visualized       Assessment & Plan:  Right and left shoulder pain Motrin 600 twice a day with food or so consult if symptoms persist  Question foreign body right index finger lesion excised with a sterile blade  Adult ADD continue Adderall

## 2011-12-24 NOTE — Patient Instructions (Addendum)
Motrin 600 mg twice daily with food for your shoulder pain  If in a couple weeks this has not seemed to help I would recommend you see Dr. Albertha Ghee at the orthopedic office  Removed the Band-Aid tomorrow return when necessary  Tramadol 50 mg dose one half to one tablet 3 times daily as needed for pain

## 2011-12-24 NOTE — Telephone Encounter (Signed)
adderall picked up at office visit 12/24/11

## 2012-01-16 ENCOUNTER — Other Ambulatory Visit: Payer: Self-pay | Admitting: Family Medicine

## 2012-04-12 ENCOUNTER — Other Ambulatory Visit: Payer: Self-pay | Admitting: Family Medicine

## 2012-04-12 DIAGNOSIS — F988 Other specified behavioral and emotional disorders with onset usually occurring in childhood and adolescence: Secondary | ICD-10-CM

## 2012-04-12 NOTE — Telephone Encounter (Signed)
Pt needs new rx generic adderall 10 mg °

## 2012-04-15 MED ORDER — AMPHETAMINE-DEXTROAMPHETAMINE 10 MG PO TABS: 10.0000 mg | ORAL_TABLET | Freq: Every day | ORAL | Status: DC

## 2012-04-15 NOTE — Telephone Encounter (Signed)
Rx ready for pick up. 

## 2012-06-07 ENCOUNTER — Other Ambulatory Visit: Payer: BC Managed Care – PPO

## 2012-06-10 ENCOUNTER — Telehealth: Payer: Self-pay | Admitting: Family Medicine

## 2012-06-10 DIAGNOSIS — E039 Hypothyroidism, unspecified: Secondary | ICD-10-CM

## 2012-06-10 DIAGNOSIS — I10 Essential (primary) hypertension: Secondary | ICD-10-CM

## 2012-06-10 NOTE — Telephone Encounter (Signed)
Pt missed labs for his cpx. Pt travels a lot w/ work. He would like to have those done at our [place] lab next week.. Can you put the order in? Thank you.

## 2012-06-11 NOTE — Telephone Encounter (Signed)
Lab orders made

## 2012-06-14 ENCOUNTER — Other Ambulatory Visit (INDEPENDENT_AMBULATORY_CARE_PROVIDER_SITE_OTHER): Payer: BC Managed Care – PPO

## 2012-06-14 DIAGNOSIS — I10 Essential (primary) hypertension: Secondary | ICD-10-CM

## 2012-06-14 DIAGNOSIS — E039 Hypothyroidism, unspecified: Secondary | ICD-10-CM

## 2012-06-14 LAB — BASIC METABOLIC PANEL
BUN: 23 mg/dL (ref 6–23)
CO2: 27 mEq/L (ref 19–32)
Calcium: 9.5 mg/dL (ref 8.4–10.5)
Chloride: 103 mEq/L (ref 96–112)
Creatinine, Ser: 1 mg/dL (ref 0.4–1.5)

## 2012-06-14 LAB — CBC WITH DIFFERENTIAL/PLATELET
Basophils Relative: 0.6 % (ref 0.0–3.0)
Eosinophils Relative: 2.1 % (ref 0.0–5.0)
HCT: 42.6 % (ref 39.0–52.0)
Hemoglobin: 14.8 g/dL (ref 13.0–17.0)
Lymphs Abs: 0.8 10*3/uL (ref 0.7–4.0)
MCV: 89.4 fl (ref 78.0–100.0)
Monocytes Absolute: 0.7 10*3/uL (ref 0.1–1.0)
Neutro Abs: 4.8 10*3/uL (ref 1.4–7.7)
RBC: 4.76 Mil/uL (ref 4.22–5.81)
WBC: 6.6 10*3/uL (ref 4.5–10.5)

## 2012-06-14 LAB — LIPID PANEL
Cholesterol: 162 mg/dL (ref 0–200)
LDL Cholesterol: 100 mg/dL — ABNORMAL HIGH (ref 0–99)
Total CHOL/HDL Ratio: 4

## 2012-06-14 LAB — URINALYSIS
Leukocytes, UA: NEGATIVE
Specific Gravity, Urine: 1.025 (ref 1.000–1.030)
Urobilinogen, UA: 0.2 (ref 0.0–1.0)
pH: 6 (ref 5.0–8.0)

## 2012-06-14 LAB — HEPATIC FUNCTION PANEL
Alkaline Phosphatase: 53 U/L (ref 39–117)
Bilirubin, Direct: 0.2 mg/dL (ref 0.0–0.3)
Total Bilirubin: 1.1 mg/dL (ref 0.3–1.2)

## 2012-06-14 LAB — PSA: PSA: 0.88 ng/mL (ref 0.10–4.00)

## 2012-06-16 ENCOUNTER — Telehealth: Payer: Self-pay | Admitting: Family Medicine

## 2012-06-16 MED ORDER — HYDROCODONE-HOMATROPINE 5-1.5 MG/5ML PO SYRP: ORAL_SOLUTION | ORAL | Status: DC

## 2012-06-16 NOTE — Telephone Encounter (Signed)
Pt had head congestion, coughing up phelm, chest tightness (chest cold) would like to know if you can call in somenthing or see today? [facility] [address]

## 2012-06-16 NOTE — Telephone Encounter (Signed)
Rx called into pharmacy. Patient is aware.

## 2012-06-17 ENCOUNTER — Encounter: Payer: BC Managed Care – PPO | Admitting: Family Medicine

## 2012-06-21 ENCOUNTER — Encounter: Payer: BC Managed Care – PPO | Admitting: Family Medicine

## 2012-06-22 ENCOUNTER — Ambulatory Visit (INDEPENDENT_AMBULATORY_CARE_PROVIDER_SITE_OTHER): Payer: BC Managed Care – PPO | Admitting: Family Medicine

## 2012-06-22 ENCOUNTER — Encounter: Payer: Self-pay | Admitting: Family Medicine

## 2012-06-22 VITALS — BP 120/80 | HR 69 | Temp 98.5°F | Wt 205.0 lb

## 2012-06-22 DIAGNOSIS — J069 Acute upper respiratory infection, unspecified: Secondary | ICD-10-CM

## 2012-06-22 MED ORDER — ALBUTEROL SULFATE HFA 108 (90 BASE) MCG/ACT IN AERS: 2.0000 | INHALATION_SPRAY | Freq: Four times a day (QID) | RESPIRATORY_TRACT | Status: DC | PRN

## 2012-06-22 MED ORDER — AZITHROMYCIN 250 MG PO TABS: ORAL_TABLET | ORAL | Status: DC

## 2012-06-22 NOTE — Progress Notes (Signed)
Chief Complaint  Patient presents with  . Cough    runny nose, chest ocngestion, chiest tightness x 9 days     HPI:  Acute visit for cough, congestion: -started 9 days ago -symptoms: runny nose, chest congestion, cough - productive, chest tightness with the coughing - repots trying to cough up mucus, worse when coughs or talks a lot -has tried OTC cold medications which have helped, but just can't seem to shake this -hycodan from PCP has helped at night -denies: fevers, vomiting, SOB -sick contacts - everyone -no hx of chronic lung disease  ROS: See pertinent positives and negatives per HPI.  Past Medical History  Diagnosis Date  . Hypertension   . Thyroid disease   . Gout   . Adult acne     Family History  Problem Relation Age of Onset  . Hypertension Other     History   Social History  . Marital Status: Married    Spouse Name: N/A    Number of Children: N/A  . Years of Education: N/A   Social History Main Topics  . Smoking status: Never Smoker   . Smokeless tobacco: None  . Alcohol Use: No  . Drug Use: No  . Sexually Active:    Other Topics Concern  . None   Social History Narrative  . None    Current outpatient prescriptions:allopurinol (ZYLOPRIM) 300 MG tablet, Take 1 tablet (300 mg total) by mouth daily., Disp: 100 tablet, Rfl: 3;  amphetamine-dextroamphetamine (ADDERALL) 10 MG tablet, Take 1 tablet (10 mg total) by mouth daily., Disp: 30 tablet, Rfl: 0;  amphetamine-dextroamphetamine (ADDERALL) 10 MG tablet, Take 1 tablet (10 mg total) by mouth daily., Disp: 30 tablet, Rfl: 0 amphetamine-dextroamphetamine (ADDERALL) 10 MG tablet, Take 1 tablet (10 mg total) by mouth daily., Disp: 30 tablet, Rfl: 0;  doxycycline (VIBRAMYCIN) 100 MG capsule, Take 100 mg by mouth 2 (two) times daily., Disp: , Rfl: ;  HYDROcodone-acetaminophen (VICODIN ES) 7.5-750 MG per tablet, Take 1 tablet by mouth every 6 (six) hours as needed., Disp: , Rfl: ;  HYDROcodone-homatropine  (HYCODAN) 5-1.5 MG/5ML syrup, ., Disp: 120 mL, Rfl: 1 levothyroxine (SYNTHROID, LEVOTHROID) 150 MCG tablet, TAKE 1 TABLET BY MOUTH EVERY DAY, Disp: 100 tablet, Rfl: 2;  metoprolol (LOPRESSOR) 50 MG tablet, Take 1 tablet (50 mg total) by mouth 1 day or 1 dose., Disp: 100 tablet, Rfl: 3;  predniSONE (DELTASONE) 20 MG tablet, Uses directed for acute gout, Disp: 30 tablet, Rfl: 1;  TRIAMCINOLONE ACETONIDE, TOP, 0.05 % OINT, Apply 1 g topically 2 (two) times daily., Disp: 430 g, Rfl: 1 triamterene-hydrochlorothiazide (MAXZIDE) 75-50 MG per tablet, TAKE 1 TABLET BY MOUTH EVERY DAY, Disp: 100 tablet, Rfl: 1;  albuterol (PROVENTIL HFA;VENTOLIN HFA) 108 (90 BASE) MCG/ACT inhaler, Inhale 2 puffs into the lungs every 6 (six) hours as needed for wheezing., Disp: 1 Inhaler, Rfl: 0;  azithromycin (ZITHROMAX) 250 MG tablet, 2 tabletson the fist day, then 1 tablet daily for 4 days, Disp: 6 tablet, Rfl: 0 colchicine 0.6 MG tablet, Take 1 tablet (0.6 mg total) by mouth daily., Disp: 90 tablet, Rfl: 3;  finasteride (PROSCAR) 5 MG tablet, Take 1 tablet (5 mg total) by mouth daily., Disp: 90 tablet, Rfl: 3  EXAM:  Filed Vitals:   06/22/12 1435  BP: 120/80  Pulse: 69  Temp: 98.5 F (36.9 C)    Body mass index is 30.71 kg/(m^2).  GENERAL: vitals reviewed and listed above, alert, oriented, appears well hydrated and in no acute  distress  HEENT: atraumatic, conjunttiva clear, no obvious abnormalities on inspection of external nose and ears, normal appearance of ear canals and TMs, yellow thick nasal congestion, mild post oropharyngeal erythema with PND, no tonsillar edema or exudate, no sinus TTP  NECK: no obvious masses on inspection  LUNGS: clear to auscultation bilaterally, no wheezes, rales or rhonchi, good air movement  CV: HRRR, no peripheral edema  MS: moves all extremities without noticeable abnormality  PSYCH: pleasant and cooperative, no obvious depression or anxiety  ASSESSMENT AND PLAN:  Discussed  the following assessment and plan:  Upper respiratory infection - Plan: azithromycin (ZITHROMAX) 250 MG tablet, albuterol (PROVENTIL HFA;VENTOLIN HFA) 108 (90 BASE) MCG/ACT inhaler  -lots of thick nasal congesiton - suspect sinusitis with some RAD - abx adn alb rx - risks discussed, return precautions advised along with nasal saline, humidifier at night -Patient advised to return or notify a doctor immediately if symptoms worsen or persist or new concerns arise.  There are no Patient Instructions on file for this visit.   Kriste Basque R.

## 2012-07-12 ENCOUNTER — Telehealth: Payer: Self-pay | Admitting: Family Medicine

## 2012-07-12 DIAGNOSIS — F988 Other specified behavioral and emotional disorders with onset usually occurring in childhood and adolescence: Secondary | ICD-10-CM

## 2012-07-12 MED ORDER — AMPHETAMINE-DEXTROAMPHETAMINE 10 MG PO TABS: 10.0000 mg | ORAL_TABLET | Freq: Every day | ORAL | Status: DC

## 2012-07-12 NOTE — Telephone Encounter (Signed)
Patient's wife called stating that he need a refill of his adderall 10 mg 1poqd as he is now completely out and has been trying to get this for a couple days. Please assist.

## 2012-07-12 NOTE — Telephone Encounter (Signed)
Rx ready for pick up and patient is aware 

## 2012-07-26 ENCOUNTER — Encounter: Payer: BC Managed Care – PPO | Admitting: Family Medicine

## 2012-07-29 ENCOUNTER — Other Ambulatory Visit: Payer: Self-pay | Admitting: Family Medicine

## 2012-07-30 ENCOUNTER — Other Ambulatory Visit: Payer: Self-pay | Admitting: Family Medicine

## 2012-10-14 ENCOUNTER — Other Ambulatory Visit: Payer: Self-pay | Admitting: Family Medicine

## 2012-10-19 ENCOUNTER — Telehealth: Payer: Self-pay | Admitting: Family Medicine

## 2012-10-19 DIAGNOSIS — F988 Other specified behavioral and emotional disorders with onset usually occurring in childhood and adolescence: Secondary | ICD-10-CM

## 2012-10-19 MED ORDER — AMPHETAMINE-DEXTROAMPHETAMINE 10 MG PO TABS: 10.0000 mg | ORAL_TABLET | Freq: Every day | ORAL | Status: DC

## 2012-10-19 NOTE — Telephone Encounter (Signed)
PT wife called to request a 3 month supply of the pt's amphetamine-dextroamphetamine (ADDERALL) 10 MG tablet. Please assist.

## 2012-10-19 NOTE — Telephone Encounter (Signed)
Printed script and will call pt to come pick up after dr todd signs script

## 2012-11-01 ENCOUNTER — Other Ambulatory Visit: Payer: Self-pay | Admitting: Family Medicine

## 2012-11-02 ENCOUNTER — Telehealth: Payer: Self-pay | Admitting: Family Medicine

## 2012-11-02 MED ORDER — TRIAMTERENE-HCTZ 75-50 MG PO TABS: ORAL_TABLET | ORAL | Status: DC

## 2012-11-02 NOTE — Telephone Encounter (Signed)
[facility] [place] a refill request over the weekend but have received no response. Please send 90-supply of pt's triamterene-hydrochlorothiazide (MAXZIDE) 75-50 MG per tablet [facility] [place], [place] on [address]

## 2012-12-21 ENCOUNTER — Other Ambulatory Visit: Payer: Self-pay | Admitting: Family Medicine

## 2013-01-24 ENCOUNTER — Ambulatory Visit: Payer: BC Managed Care – PPO | Admitting: Family Medicine

## 2013-01-26 ENCOUNTER — Other Ambulatory Visit: Payer: Self-pay | Admitting: Family Medicine

## 2013-02-07 ENCOUNTER — Telehealth: Payer: Self-pay | Admitting: Family Medicine

## 2013-02-07 DIAGNOSIS — F988 Other specified behavioral and emotional disorders with onset usually occurring in childhood and adolescence: Secondary | ICD-10-CM

## 2013-02-07 MED ORDER — AMPHETAMINE-DEXTROAMPHETAMINE 10 MG PO TABS: 10.0000 mg | ORAL_TABLET | Freq: Every day | ORAL | Status: DC

## 2013-02-07 NOTE — Telephone Encounter (Signed)
Rx ready for pick up and patient is aware 

## 2013-02-07 NOTE — Telephone Encounter (Signed)
Pt is calling to request a 3 month refill of his amphetamine-dextroamphetamine (ADDERALL) 10 MG tablet. Please assist.

## 2013-02-09 ENCOUNTER — Ambulatory Visit (INDEPENDENT_AMBULATORY_CARE_PROVIDER_SITE_OTHER): Payer: BC Managed Care – PPO | Admitting: Family Medicine

## 2013-02-09 ENCOUNTER — Encounter: Payer: Self-pay | Admitting: Family Medicine

## 2013-02-09 VITALS — BP 130/98 | Temp 97.6°F | Wt 210.0 lb

## 2013-02-09 DIAGNOSIS — D2330 Other benign neoplasm of skin of unspecified part of face: Secondary | ICD-10-CM

## 2013-02-09 DIAGNOSIS — E039 Hypothyroidism, unspecified: Secondary | ICD-10-CM

## 2013-02-09 DIAGNOSIS — N401 Enlarged prostate with lower urinary tract symptoms: Secondary | ICD-10-CM

## 2013-02-09 DIAGNOSIS — L989 Disorder of the skin and subcutaneous tissue, unspecified: Secondary | ICD-10-CM

## 2013-02-09 DIAGNOSIS — N138 Other obstructive and reflux uropathy: Secondary | ICD-10-CM

## 2013-02-09 DIAGNOSIS — Z23 Encounter for immunization: Secondary | ICD-10-CM

## 2013-02-09 DIAGNOSIS — F988 Other specified behavioral and emotional disorders with onset usually occurring in childhood and adolescence: Secondary | ICD-10-CM

## 2013-02-09 DIAGNOSIS — M109 Gout, unspecified: Secondary | ICD-10-CM

## 2013-02-09 DIAGNOSIS — I10 Essential (primary) hypertension: Secondary | ICD-10-CM

## 2013-02-09 MED ORDER — ALLOPURINOL 300 MG PO TABS: 300.0000 mg | ORAL_TABLET | Freq: Every day | ORAL | Status: DC

## 2013-02-09 MED ORDER — INDOMETHACIN 50 MG PO CAPS: ORAL_CAPSULE | ORAL | Status: DC

## 2013-02-09 MED ORDER — DOXYCYCLINE HYCLATE 100 MG PO CAPS: ORAL_CAPSULE | ORAL | Status: DC

## 2013-02-09 MED ORDER — SILDENAFIL CITRATE 50 MG PO TABS: 50.0000 mg | ORAL_TABLET | ORAL | Status: DC | PRN

## 2013-02-09 MED ORDER — TRIAMTERENE-HCTZ 75-50 MG PO TABS: ORAL_TABLET | ORAL | Status: DC

## 2013-02-09 MED ORDER — METOPROLOL TARTRATE 50 MG PO TABS: ORAL_TABLET | ORAL | Status: DC

## 2013-02-09 MED ORDER — LEVOTHYROXINE SODIUM 150 MCG PO TABS: ORAL_TABLET | ORAL | Status: DC

## 2013-02-09 MED ORDER — FINASTERIDE 5 MG PO TABS: ORAL_TABLET | ORAL | Status: DC

## 2013-02-09 NOTE — Progress Notes (Signed)
  Subjective:    Patient ID: Jose Evans, male    DOB: 11/03/1945, 67 y.o.   MRN: [id number]  HPI Jose Evans is a 67 year old married male nonsmoker who comes in today for a general physical examination because of a history of gout, adult ADD, rosacea, BPH, osteoarthritis, hypothyroidism, hypertension, and a history of a dysplastic nevus that we removed from the left side of his face a couple years ago  His med list was reviewed there've been no changes. He continues to take his Adderall 10 mg daily for adult ADD.  He gets routine eye care, dental care, again declines a colonoscopy, vaccinations up-to-date seasonal flu shot given today.  He states that the lesion on the left side of his face that we removed now has come back it is hyperpigmented in its an area that continues to bleed.  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   HENT: Negative.   Eyes: Negative.   Respiratory: Negative.   Cardiovascular: Negative.   Gastrointestinal: Negative.   Endocrine: Negative.   Genitourinary: Negative.   Musculoskeletal: Negative.   Skin: Negative.   Allergic/Immunologic: Negative.   Neurological: Negative.   Hematological: Negative.   Psychiatric/Behavioral: Negative.        Objective:   Physical Exam  Nursing note and vitals reviewed. Constitutional: He is oriented to person, place, and time. He appears well-developed and well-nourished.  HENT:  Head: Normocephalic and atraumatic.  Right Ear: External ear normal.  Left Ear: External ear normal.  Nose: Nose normal.  Mouth/Throat: Oropharynx is clear and moist.  Eyes: Conjunctivae and EOM are normal. Pupils are equal, round, and reactive to light.  Neck: Normal range of motion. Neck supple. No JVD present. No tracheal deviation present. No thyromegaly present.  Cardiovascular: Normal rate, regular rhythm, normal heart sounds and intact distal pulses.  Exam reveals no  gallop and no friction rub.   No murmur heard. Pulmonary/Chest: Effort normal and breath sounds normal. No stridor. No respiratory distress. He has no wheezes. He has no rales. He exhibits no tenderness.  Abdominal: Soft. Bowel sounds are normal. He exhibits no distension and no mass. There is no tenderness. There is no rebound and no guarding.  Genitourinary: Rectum normal and penis normal. Guaiac negative stool. No penile tenderness.  1+ symmetrical BPH  Musculoskeletal: Normal range of motion. He exhibits no edema and no tenderness.  Lymphadenopathy:    He has no cervical adenopathy.  Neurological: He is alert and oriented to person, place, and time. He has normal reflexes. No cranial nerve deficit. He exhibits normal muscle tone.  Skin: Skin is warm and dry. No rash noted. No erythema. No pallor.  Total body skin exam shows he has multiple seborrheic keratosis freckles moles capillaries hemangiomas. The lesion on his face measures 10 mm x 5 mm. One area has a slight depression in this bleeding.  Psychiatric: He has a normal mood and affect. His behavior is normal. Judgment and thought content normal.          Assessment & Plan:  Healthy male  History of gout continue allopurinol  History of adult ADD continue Adderall 10 mg daily  Rosacea continue doxycycline  BPH continue finasteride 5 mg daily  Osteoarthritis indomethacin 50 mg 3 times a day when necessary  Hypothyroidism continue Synthroid  Hypertension continue Lopressor and Maxide  Abnormal lesion left face recurrence referred to Dr. Margo Aye probable Mohs surgery

## 2013-02-09 NOTE — Patient Instructions (Signed)
Continue your current medications  I put in a consult with Dr. Margo Aye at [facility] for dermatology to address the lesion on your face  Return tomorrow for for fasting labs

## 2013-02-10 ENCOUNTER — Other Ambulatory Visit (INDEPENDENT_AMBULATORY_CARE_PROVIDER_SITE_OTHER): Payer: BC Managed Care – PPO

## 2013-02-10 DIAGNOSIS — N138 Other obstructive and reflux uropathy: Secondary | ICD-10-CM

## 2013-02-10 DIAGNOSIS — F988 Other specified behavioral and emotional disorders with onset usually occurring in childhood and adolescence: Secondary | ICD-10-CM

## 2013-02-10 DIAGNOSIS — N401 Enlarged prostate with lower urinary tract symptoms: Secondary | ICD-10-CM

## 2013-02-10 DIAGNOSIS — I10 Essential (primary) hypertension: Secondary | ICD-10-CM

## 2013-02-10 LAB — LIPID PANEL
Cholesterol: 168 mg/dL (ref 0–200)
LDL Cholesterol: 97 mg/dL (ref 0–99)
Total CHOL/HDL Ratio: 4
VLDL: 33.2 mg/dL (ref 0.0–40.0)

## 2013-02-10 LAB — CBC WITH DIFFERENTIAL/PLATELET
Basophils Absolute: 0.1 10*3/uL (ref 0.0–0.1)
Basophils Relative: 0.8 % (ref 0.0–3.0)
Eosinophils Absolute: 0.1 10*3/uL (ref 0.0–0.7)
Hemoglobin: 15.4 g/dL (ref 13.0–17.0)
MCHC: 35.3 g/dL (ref 30.0–36.0)
MCV: 88.2 fl (ref 78.0–100.0)
Monocytes Absolute: 0.8 10*3/uL (ref 0.1–1.0)
Neutro Abs: 6.3 10*3/uL (ref 1.4–7.7)
Neutrophils Relative %: 76.4 % (ref 43.0–77.0)
RBC: 4.94 Mil/uL (ref 4.22–5.81)
RDW: 13.9 % (ref 11.5–14.6)

## 2013-02-10 LAB — POCT URINALYSIS DIPSTICK
Nitrite, UA: NEGATIVE
Protein, UA: NEGATIVE
Spec Grav, UA: 1.02
Urobilinogen, UA: 0.2
pH, UA: 6

## 2013-02-10 LAB — HEPATIC FUNCTION PANEL
Bilirubin, Direct: 0.2 mg/dL (ref 0.0–0.3)
Total Bilirubin: 0.9 mg/dL (ref 0.3–1.2)
Total Protein: 6.6 g/dL (ref 6.0–8.3)

## 2013-02-10 LAB — BASIC METABOLIC PANEL
BUN: 24 mg/dL — ABNORMAL HIGH (ref 6–23)
Calcium: 9.8 mg/dL (ref 8.4–10.5)
GFR: 75.27 mL/min (ref >60.00)
Glucose, Bld: 91 mg/dL (ref 70–99)
Potassium: 4.2 mEq/L (ref 3.5–5.1)
Sodium: 139 mEq/L (ref 135–145)

## 2013-02-17 ENCOUNTER — Other Ambulatory Visit: Payer: Self-pay | Admitting: Dermatology

## 2013-03-18 ENCOUNTER — Other Ambulatory Visit: Payer: Self-pay | Admitting: *Deleted

## 2013-03-18 NOTE — Telephone Encounter (Signed)
Patient is calling because he would like a prescription refill of his Vicodin.  He was using tramadol for his gout flare up but it does not help as well as the Vicodin.  Is this okay to fill?

## 2013-03-22 NOTE — Telephone Encounter (Signed)
Okay per Dr Tawanna Cooler.  Will be printed when Dr Tawanna Cooler returns

## 2013-03-23 MED ORDER — HYDROCODONE-ACETAMINOPHEN 7.5-325 MG PO TABS: 1.0000 | ORAL_TABLET | Freq: Four times a day (QID) | ORAL | Status: DC | PRN

## 2013-03-23 NOTE — Telephone Encounter (Signed)
Okay per Dr Tawanna Cooler.  Rx ready for Dr Tawanna Cooler.  Patient aware.

## 2013-05-09 ENCOUNTER — Other Ambulatory Visit: Payer: Self-pay | Admitting: Family Medicine

## 2013-05-10 ENCOUNTER — Telehealth: Payer: Self-pay | Admitting: Family Medicine

## 2013-05-10 DIAGNOSIS — F988 Other specified behavioral and emotional disorders with onset usually occurring in childhood and adolescence: Secondary | ICD-10-CM

## 2013-05-10 MED ORDER — AMPHETAMINE-DEXTROAMPHETAMINE 10 MG PO TABS: 10.0000 mg | ORAL_TABLET | Freq: Every day | ORAL | Status: DC

## 2013-05-10 NOTE — Telephone Encounter (Signed)
Pt needs new rx generic adderall 10 mg °

## 2013-05-10 NOTE — Telephone Encounter (Signed)
Rx ready to pick up and wife is aware

## 2013-09-05 ENCOUNTER — Telehealth: Payer: Self-pay | Admitting: Family Medicine

## 2013-09-05 DIAGNOSIS — F988 Other specified behavioral and emotional disorders with onset usually occurring in childhood and adolescence: Secondary | ICD-10-CM

## 2013-09-05 NOTE — Telephone Encounter (Signed)
Pt req rx  amphetamine-dextroamphetamine (ADDERALL) 10 MG tablet

## 2013-09-06 MED ORDER — AMPHETAMINE-DEXTROAMPHETAMINE 10 MG PO TABS: 10.0000 mg | ORAL_TABLET | Freq: Every day | ORAL | Status: DC

## 2013-09-06 NOTE — Telephone Encounter (Signed)
Rx ready for pick up and Left message on machine for patient   

## 2013-09-26 ENCOUNTER — Other Ambulatory Visit: Payer: Self-pay | Admitting: Family Medicine

## 2013-12-13 ENCOUNTER — Telehealth: Payer: Self-pay | Admitting: Family Medicine

## 2013-12-13 DIAGNOSIS — F988 Other specified behavioral and emotional disorders with onset usually occurring in childhood and adolescence: Secondary | ICD-10-CM

## 2013-12-13 NOTE — Telephone Encounter (Signed)
° ° ° °

## 2013-12-14 MED ORDER — AMPHETAMINE-DEXTROAMPHETAMINE 10 MG PO TABS: 10.0000 mg | ORAL_TABLET | Freq: Every day | ORAL | Status: DC

## 2013-12-14 NOTE — Telephone Encounter (Signed)
Rx ready for pick up Left message on machine

## 2014-01-17 ENCOUNTER — Other Ambulatory Visit: Payer: Self-pay | Admitting: Family Medicine

## 2014-02-18 ENCOUNTER — Other Ambulatory Visit: Payer: Self-pay | Admitting: Family Medicine

## 2014-03-13 ENCOUNTER — Ambulatory Visit (INDEPENDENT_AMBULATORY_CARE_PROVIDER_SITE_OTHER): Payer: Medicare HMO | Admitting: Family Medicine

## 2014-03-13 ENCOUNTER — Encounter: Payer: Self-pay | Admitting: Family Medicine

## 2014-03-13 VITALS — BP 130/80 | Temp 98.0°F | Wt 205.0 lb

## 2014-03-13 DIAGNOSIS — M10079 Idiopathic gout, unspecified ankle and foot: Secondary | ICD-10-CM

## 2014-03-13 DIAGNOSIS — F909 Attention-deficit hyperactivity disorder, unspecified type: Secondary | ICD-10-CM

## 2014-03-13 DIAGNOSIS — D1801 Hemangioma of skin and subcutaneous tissue: Secondary | ICD-10-CM

## 2014-03-13 DIAGNOSIS — I10 Essential (primary) hypertension: Secondary | ICD-10-CM

## 2014-03-13 DIAGNOSIS — R351 Nocturia: Secondary | ICD-10-CM

## 2014-03-13 DIAGNOSIS — M1000 Idiopathic gout, unspecified site: Secondary | ICD-10-CM

## 2014-03-13 DIAGNOSIS — Z23 Encounter for immunization: Secondary | ICD-10-CM

## 2014-03-13 DIAGNOSIS — M109 Gout, unspecified: Secondary | ICD-10-CM

## 2014-03-13 DIAGNOSIS — N401 Enlarged prostate with lower urinary tract symptoms: Secondary | ICD-10-CM

## 2014-03-13 DIAGNOSIS — F988 Other specified behavioral and emotional disorders with onset usually occurring in childhood and adolescence: Secondary | ICD-10-CM

## 2014-03-13 MED ORDER — LEVOTHYROXINE SODIUM 150 MCG PO TABS: 150.0000 ug | ORAL_TABLET | Freq: Every day | ORAL | Status: DC

## 2014-03-13 MED ORDER — FINASTERIDE 5 MG PO TABS: ORAL_TABLET | ORAL | Status: DC

## 2014-03-13 MED ORDER — SILDENAFIL CITRATE 50 MG PO TABS: 50.0000 mg | ORAL_TABLET | ORAL | Status: DC | PRN

## 2014-03-13 MED ORDER — AMPHETAMINE-DEXTROAMPHETAMINE 10 MG PO TABS: 10.0000 mg | ORAL_TABLET | Freq: Every day | ORAL | Status: DC

## 2014-03-13 MED ORDER — METOPROLOL TARTRATE 50 MG PO TABS
ORAL_TABLET | ORAL | Status: DC
Start: 2014-03-13 — End: 2014-04-20

## 2014-03-13 MED ORDER — TRIAMTERENE-HCTZ 75-50 MG PO TABS: ORAL_TABLET | ORAL | Status: DC

## 2014-03-13 MED ORDER — ALLOPURINOL 300 MG PO TABS: 300.0000 mg | ORAL_TABLET | Freq: Every day | ORAL | Status: DC

## 2014-03-13 NOTE — Progress Notes (Signed)
   Subjective:    Patient ID: Renzo Vincelette, male    DOB: 1946-03-10, 68 y.o.   MRN: [id number]  HPI Mr. Kloster is a 68 year old married male nonsmoker who comes in today for evaluation of a lesion on the right side of his nose  He says is been there for a long time hasn't really changed with his wife's concerned about it.  He does spend a lot of time in the sun. He does work for the [employer] in [place] and [place]. He tries to wear his sunscreens daily   Review of Systems Review of systems negative except for physical examination October 23 of last year    Objective:   Physical Exam  Well-developed well-nourished male no acute distress vital signs stable is afebrile examination of the face was normal except for a capillary hemangioma on the right side of his nose.  We discussed treatment options which include bleeding alone because its benign or cauterizing it. He says it doesn't bother him therefore he would elect to leave that alone      Assessment & Plan:  Capillary hemangioma.... Reassured

## 2014-03-13 NOTE — Progress Notes (Signed)
Pre visit review using our clinic review tool, if applicable. No additional management support is needed unless otherwise documented below in the visit note. 

## 2014-03-13 NOTE — Patient Instructions (Signed)
Set up a time the fourth week in January........ 30 minutes for physical evaluation  Because of your schedule would do the labs the same day

## 2014-03-14 ENCOUNTER — Telehealth: Payer: Self-pay | Admitting: Family Medicine

## 2014-03-14 NOTE — Telephone Encounter (Signed)
emmi mailed  °

## 2014-04-10 ENCOUNTER — Telehealth: Payer: Self-pay | Admitting: Family Medicine

## 2014-04-10 MED ORDER — LEVOTHYROXINE SODIUM 150 MCG PO TABS: 150.0000 ug | ORAL_TABLET | Freq: Every day | ORAL | Status: DC

## 2014-04-10 NOTE — Telephone Encounter (Signed)
Patient's wife states patient needs a re-fill on levothyroxine (SYNTHROID, LEVOTHROID) 150 MCG tablet, she states patient needs a hard copy of the script.

## 2014-04-10 NOTE — Telephone Encounter (Signed)
Left message on machine for patient Rx ready for pick up 

## 2014-04-17 ENCOUNTER — Telehealth: Payer: Self-pay | Admitting: Family Medicine

## 2014-04-17 DIAGNOSIS — I10 Essential (primary) hypertension: Secondary | ICD-10-CM

## 2014-04-17 DIAGNOSIS — D2330 Other benign neoplasm of skin of unspecified part of face: Secondary | ICD-10-CM

## 2014-04-17 NOTE — Telephone Encounter (Signed)
Pt would like to have several of his meds written out in paper to take w/ him while traveling. Pt would like a cb.

## 2014-04-18 NOTE — Telephone Encounter (Signed)
Spoke with patient and he would like printed prescriptions for Metoprolol, Maxzide, and Doxycycline.  Patient is aware that Dr Sherren Mocha is out of the office until Thursday.

## 2014-04-19 ENCOUNTER — Other Ambulatory Visit: Payer: Self-pay | Admitting: Family Medicine

## 2014-04-20 ENCOUNTER — Other Ambulatory Visit: Payer: Self-pay | Admitting: Family Medicine

## 2014-04-20 MED ORDER — TRIAMTERENE-HCTZ 75-50 MG PO TABS: ORAL_TABLET | ORAL | Status: DC

## 2014-04-20 MED ORDER — METOPROLOL TARTRATE 50 MG PO TABS: ORAL_TABLET | ORAL | Status: DC

## 2014-04-20 MED ORDER — DOXYCYCLINE HYCLATE 100 MG PO CAPS: ORAL_CAPSULE | ORAL | Status: DC

## 2014-04-20 NOTE — Telephone Encounter (Signed)
Rx ready for pick up and patient is aware 

## 2014-05-18 ENCOUNTER — Encounter: Payer: Medicare Other | Admitting: Family Medicine

## 2014-06-12 ENCOUNTER — Telehealth: Payer: Self-pay | Admitting: Family Medicine

## 2014-06-12 DIAGNOSIS — F988 Other specified behavioral and emotional disorders with onset usually occurring in childhood and adolescence: Secondary | ICD-10-CM

## 2014-06-12 MED ORDER — AMPHETAMINE-DEXTROAMPHETAMINE 10 MG PO TABS: 10.0000 mg | ORAL_TABLET | Freq: Every day | ORAL | Status: DC

## 2014-06-12 NOTE — Telephone Encounter (Signed)
Patient need a re-fill on amphetamine-dextroamphetamine (ADDERALL) 10 MG tablet. He states his wife has an appointment with Dr. Sherren Mocha today and would like for you to give it to her.

## 2014-06-13 NOTE — Telephone Encounter (Signed)
Given to wife at office visit today

## 2014-08-21 ENCOUNTER — Other Ambulatory Visit: Payer: Self-pay | Admitting: *Deleted

## 2014-08-21 DIAGNOSIS — F988 Other specified behavioral and emotional disorders with onset usually occurring in childhood and adolescence: Secondary | ICD-10-CM

## 2014-08-21 DIAGNOSIS — D2330 Other benign neoplasm of skin of unspecified part of face: Secondary | ICD-10-CM

## 2014-08-21 MED ORDER — AMPHETAMINE-DEXTROAMPHETAMINE 10 MG PO TABS: 10.0000 mg | ORAL_TABLET | Freq: Every day | ORAL | Status: DC

## 2014-08-21 MED ORDER — DOXYCYCLINE HYCLATE 100 MG PO CAPS: ORAL_CAPSULE | ORAL | Status: DC

## 2014-12-22 ENCOUNTER — Telehealth: Payer: Self-pay | Admitting: Family Medicine

## 2014-12-22 DIAGNOSIS — F988 Other specified behavioral and emotional disorders with onset usually occurring in childhood and adolescence: Secondary | ICD-10-CM

## 2014-12-22 NOTE — Telephone Encounter (Signed)
° ° ° °

## 2014-12-26 MED ORDER — AMPHETAMINE-DEXTROAMPHETAMINE 10 MG PO TABS: 10.0000 mg | ORAL_TABLET | Freq: Every day | ORAL | Status: DC

## 2014-12-27 NOTE — Telephone Encounter (Signed)
Rx ready for pick up and patient is aware 

## 2015-01-04 ENCOUNTER — Telehealth: Payer: Self-pay | Admitting: Family Medicine

## 2015-01-04 NOTE — Telephone Encounter (Signed)
Pt want a refill on TRIAMCINOLONE ACETONIDE, TOP, 0.05 % OINT  [facility] [address]

## 2015-01-05 ENCOUNTER — Other Ambulatory Visit: Payer: Self-pay | Admitting: *Deleted

## 2015-01-05 MED ORDER — TRIAMCINOLONE ACETONIDE 0.5 % EX OINT: 1.0000 "application " | TOPICAL_OINTMENT | Freq: Two times a day (BID) | CUTANEOUS | Status: DC

## 2015-01-05 MED ORDER — TRIAMCINOLONE ACETONIDE 0.05 % EX OINT: 1.0000 g | TOPICAL_OINTMENT | Freq: Two times a day (BID) | CUTANEOUS | Status: DC

## 2015-01-15 ENCOUNTER — Telehealth: Payer: Self-pay | Admitting: Family Medicine

## 2015-01-15 ENCOUNTER — Encounter: Payer: Self-pay | Admitting: Family Medicine

## 2015-01-15 ENCOUNTER — Ambulatory Visit (INDEPENDENT_AMBULATORY_CARE_PROVIDER_SITE_OTHER): Payer: Medicare HMO | Admitting: Family Medicine

## 2015-01-15 VITALS — BP 135/70 | HR 61 | Temp 98.8°F | Wt 203.0 lb

## 2015-01-15 DIAGNOSIS — Z0000 Encounter for general adult medical examination without abnormal findings: Secondary | ICD-10-CM

## 2015-01-15 DIAGNOSIS — H9202 Otalgia, left ear: Secondary | ICD-10-CM | POA: Diagnosis not present

## 2015-01-15 MED ORDER — NEOMYCIN-POLYMYXIN-HC 3.5-10000-1 OT SOLN: OTIC | Status: DC

## 2015-01-15 NOTE — Patient Instructions (Signed)
Cortisporin otic drops.......... 2 drops in your left ear canal bedtime...Marland KitchenMarland KitchenMarland Kitchen Pack with cotton...Marland KitchenMarland KitchenMarland Kitchen Remove the cotton in the morning  Do this at bedtime for 10 nights  Schedule physical examination this fall with me  Fasting labs one week prior

## 2015-01-15 NOTE — Telephone Encounter (Signed)
Patient was seen today.

## 2015-01-15 NOTE — Telephone Encounter (Signed)
Pt has ear infection and requesting appt. Ok to work in?

## 2015-01-15 NOTE — Telephone Encounter (Signed)
per david Sprigg, cma, 11:30 is good to work in per dr todd. Apolonio Schneiders out today

## 2015-01-15 NOTE — Progress Notes (Signed)
Pre visit review using our clinic review tool, if applicable. No additional management support is needed unless otherwise documented below in the visit note. 

## 2015-01-15 NOTE — Progress Notes (Signed)
   Subjective:    Patient ID: Jose Evans, male    DOB: [birth year], 69 y.o.   MRN: [id number]  HPI  Jose Evans is a 68 year old male who comes in today for evaluation of discharge and slight pain in his left ear for about a week. No history of trauma   Review of Systems Review of systems otherwise negative    Objective:   Physical Exam  Well-developed well-nourished male no acute distress vital signs stable is afebrile examination of the both ears were normal except the left ear canal was red and swollen      Assessment & Plan:  Swimmer's ear left......... Eardrops return when necessary

## 2015-03-16 ENCOUNTER — Other Ambulatory Visit: Payer: Self-pay | Admitting: Family Medicine

## 2015-04-17 ENCOUNTER — Other Ambulatory Visit: Payer: Self-pay | Admitting: Family Medicine

## 2015-05-07 ENCOUNTER — Telehealth: Payer: Self-pay | Admitting: Family Medicine

## 2015-05-07 DIAGNOSIS — F988 Other specified behavioral and emotional disorders with onset usually occurring in childhood and adolescence: Secondary | ICD-10-CM

## 2015-05-07 MED ORDER — AMPHETAMINE-DEXTROAMPHETAMINE 10 MG PO TABS: 10.0000 mg | ORAL_TABLET | Freq: Every day | ORAL | Status: DC

## 2015-05-07 NOTE — Telephone Encounter (Signed)
Rx ready for pick up and patient is aware 

## 2015-05-07 NOTE — Telephone Encounter (Signed)
° ° ° °

## 2015-05-21 ENCOUNTER — Other Ambulatory Visit (INDEPENDENT_AMBULATORY_CARE_PROVIDER_SITE_OTHER): Payer: Medicare HMO

## 2015-05-21 DIAGNOSIS — Z0000 Encounter for general adult medical examination without abnormal findings: Secondary | ICD-10-CM

## 2015-05-21 DIAGNOSIS — E039 Hypothyroidism, unspecified: Secondary | ICD-10-CM

## 2015-05-21 DIAGNOSIS — Z125 Encounter for screening for malignant neoplasm of prostate: Secondary | ICD-10-CM | POA: Diagnosis not present

## 2015-05-21 DIAGNOSIS — I10 Essential (primary) hypertension: Secondary | ICD-10-CM | POA: Diagnosis not present

## 2015-05-21 LAB — HEPATIC FUNCTION PANEL
ALBUMIN: 4.6 g/dL (ref 3.5–5.2)
ALK PHOS: 63 U/L (ref 39–117)
ALT: 29 U/L (ref 0–53)
AST: 23 U/L (ref 0–37)
Bilirubin, Direct: 0.2 mg/dL (ref 0.0–0.3)
Total Bilirubin: 0.8 mg/dL (ref 0.2–1.2)
Total Protein: 6.9 g/dL (ref 6.0–8.3)

## 2015-05-21 LAB — LIPID PANEL
CHOL/HDL RATIO: 4
Cholesterol: 155 mg/dL (ref 0–200)
HDL: 41 mg/dL (ref >39.00)
LDL Cholesterol: 94 mg/dL (ref 0–99)
NONHDL: 113.85
TRIGLYCERIDES: 97 mg/dL (ref 0.0–149.0)
VLDL: 19.4 mg/dL (ref 0.0–40.0)

## 2015-05-21 LAB — BASIC METABOLIC PANEL
BUN: 22 mg/dL (ref 6–23)
CALCIUM: 9.7 mg/dL (ref 8.4–10.5)
CO2: 27 mEq/L (ref 19–32)
Chloride: 102 mEq/L (ref 96–112)
Creatinine, Ser: 1.05 mg/dL (ref 0.40–1.50)
GFR: 74.74 mL/min (ref >60.00)
GLUCOSE: 117 mg/dL — AB (ref 70–99)
POTASSIUM: 4.1 meq/L (ref 3.5–5.1)
Sodium: 142 mEq/L (ref 135–145)

## 2015-05-21 LAB — POC URINALSYSI DIPSTICK (AUTOMATED)
BILIRUBIN UA: NEGATIVE
Glucose, UA: NEGATIVE
Ketones, UA: NEGATIVE
Leukocytes, UA: NEGATIVE
NITRITE UA: NEGATIVE
PH UA: 6.5
Protein, UA: NEGATIVE
RBC UA: NEGATIVE
SPEC GRAV UA: 1.01
Urobilinogen, UA: 0.2

## 2015-05-21 LAB — CBC WITH DIFFERENTIAL/PLATELET
BASOS ABS: 0.1 10*3/uL (ref 0.0–0.1)
Basophils Relative: 0.8 % (ref 0.0–3.0)
EOS PCT: 2.6 % (ref 0.0–5.0)
Eosinophils Absolute: 0.2 10*3/uL (ref 0.0–0.7)
HEMATOCRIT: 48.9 % (ref 39.0–52.0)
HEMOGLOBIN: 16.5 g/dL (ref 13.0–17.0)
LYMPHS ABS: 1.8 10*3/uL (ref 0.7–4.0)
LYMPHS PCT: 22.3 % (ref 12.0–46.0)
MCHC: 33.8 g/dL (ref 30.0–36.0)
MCV: 92.8 fl (ref 78.0–100.0)
MONOS PCT: 7 % (ref 3.0–12.0)
Monocytes Absolute: 0.6 10*3/uL (ref 0.1–1.0)
NEUTROS PCT: 67.3 % (ref 43.0–77.0)
Neutro Abs: 5.4 10*3/uL (ref 1.4–7.7)
Platelets: 213 10*3/uL (ref 150.0–400.0)
RBC: 5.27 Mil/uL (ref 4.22–5.81)
RDW: 14.4 % (ref 11.5–15.5)
WBC: 8 10*3/uL (ref 4.0–10.5)

## 2015-05-21 LAB — TSH: TSH: 1.97 u[IU]/mL (ref 0.35–4.50)

## 2015-05-21 LAB — PSA: PSA: 0.79 ng/mL (ref 0.10–4.00)

## 2015-05-28 ENCOUNTER — Encounter: Payer: Self-pay | Admitting: Family Medicine

## 2015-05-28 ENCOUNTER — Ambulatory Visit (INDEPENDENT_AMBULATORY_CARE_PROVIDER_SITE_OTHER): Payer: Medicare HMO | Admitting: Family Medicine

## 2015-05-28 VITALS — BP 120/80 | Temp 98.5°F | Ht 69.0 in | Wt 206.0 lb

## 2015-05-28 DIAGNOSIS — M109 Gout, unspecified: Secondary | ICD-10-CM

## 2015-05-28 DIAGNOSIS — F909 Attention-deficit hyperactivity disorder, unspecified type: Secondary | ICD-10-CM | POA: Diagnosis not present

## 2015-05-28 DIAGNOSIS — Z23 Encounter for immunization: Secondary | ICD-10-CM | POA: Diagnosis not present

## 2015-05-28 DIAGNOSIS — G8929 Other chronic pain: Secondary | ICD-10-CM

## 2015-05-28 DIAGNOSIS — N401 Enlarged prostate with lower urinary tract symptoms: Secondary | ICD-10-CM

## 2015-05-28 DIAGNOSIS — R351 Nocturia: Secondary | ICD-10-CM

## 2015-05-28 DIAGNOSIS — D2330 Other benign neoplasm of skin of unspecified part of face: Secondary | ICD-10-CM

## 2015-05-28 DIAGNOSIS — R69 Illness, unspecified: Secondary | ICD-10-CM | POA: Diagnosis not present

## 2015-05-28 DIAGNOSIS — R1011 Right upper quadrant pain: Secondary | ICD-10-CM

## 2015-05-28 DIAGNOSIS — D2230 Melanocytic nevi of unspecified part of face: Secondary | ICD-10-CM

## 2015-05-28 DIAGNOSIS — R1010 Upper abdominal pain, unspecified: Secondary | ICD-10-CM

## 2015-05-28 DIAGNOSIS — E039 Hypothyroidism, unspecified: Secondary | ICD-10-CM

## 2015-05-28 DIAGNOSIS — F988 Other specified behavioral and emotional disorders with onset usually occurring in childhood and adolescence: Secondary | ICD-10-CM

## 2015-05-28 DIAGNOSIS — I10 Essential (primary) hypertension: Secondary | ICD-10-CM | POA: Diagnosis not present

## 2015-05-28 DIAGNOSIS — Z0000 Encounter for general adult medical examination without abnormal findings: Secondary | ICD-10-CM

## 2015-05-28 MED ORDER — DOXYCYCLINE HYCLATE 100 MG PO CAPS: ORAL_CAPSULE | ORAL | Status: DC

## 2015-05-28 MED ORDER — METOPROLOL TARTRATE 50 MG PO TABS: 50.0000 mg | ORAL_TABLET | Freq: Every day | ORAL | Status: DC

## 2015-05-28 MED ORDER — ALLOPURINOL 300 MG PO TABS: 300.0000 mg | ORAL_TABLET | Freq: Every day | ORAL | Status: DC

## 2015-05-28 MED ORDER — TRIAMTERENE-HCTZ 75-50 MG PO TABS: 1.0000 | ORAL_TABLET | Freq: Every day | ORAL | Status: DC

## 2015-05-28 MED ORDER — LEVOTHYROXINE SODIUM 150 MCG PO TABS: 150.0000 ug | ORAL_TABLET | Freq: Every day | ORAL | Status: DC

## 2015-05-28 MED ORDER — FINASTERIDE 5 MG PO TABS: ORAL_TABLET | ORAL | Status: DC

## 2015-05-28 NOTE — Progress Notes (Signed)
Subjective:    Patient ID: Jose Evans, male    DOB: [birth year], 70 y.o.   MRN: [id number]  HPI  Jose Evans is a 70 year old married male nonsmoker who comes in today for just physical examination because of a history of gout, adult ADD, rosacea, BPH, hypothyroidism, hypertension, and a new problem of abdominal pain  He takes allopurinol 3 mg daily to prevent gout is had no attacks  He takes Adderall 10 mg daily for adult ADD  He uses Vibramycin 100 mg twice a day when necessary for rosacea and Proscar 5 mg for BPH  He takes Synthroid 150 g daily for hypothyroidism and Lopressor 50 mg daily and Maxide 75-50 daily because of a history of hypertension BP 120/80  He gets routine eye care, dental care, colonoscopy he's never had one. I've encouraged him to do this since age 19. He has willing to go talk to GI about some noninvasive screening for colon cancer.  Vaccinations Pneumovax given today tetanus booster 2013 encouraged to call and find out where and get the shingles shot the cheapest  He tells me in the past 2 years she's had 2 episodes of severe abdominal pain. He describes a half hour 45 minutes after eating a fatty meal severe right upper quadrant pain radiating to his back.  He also has a tendency to be presyncopal sometimes he feels real lightheaded stops what he is doing and he doesn't pass out.  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   HENT: Negative.   Eyes: Negative.   Respiratory: Negative.   Cardiovascular: Negative.   Gastrointestinal: Negative.   Endocrine: Negative.   Genitourinary: Negative.   Musculoskeletal: Negative.   Skin: Negative.   Allergic/Immunologic: Negative.   Neurological: Negative.   Hematological: Negative.   Psychiatric/Behavioral: Negative.        Objective:   Physical Exam  Constitutional: He is oriented to person, place, and time. He appears  well-developed and well-nourished.  HENT:  Head: Normocephalic and atraumatic.  Right Ear: External ear normal.  Left Ear: External ear normal.  Nose: Nose normal.  Mouth/Throat: Oropharynx is clear and moist.  Eyes: Conjunctivae and EOM are normal. Pupils are equal, round, and reactive to light.  Neck: Normal range of motion. Neck supple. No JVD present. No tracheal deviation present. No thyromegaly present.  Cardiovascular: Normal rate, regular rhythm, normal heart sounds and intact distal pulses.  Exam reveals no gallop and no friction rub.   No murmur heard. No carotid nor aortic bruits peripheral pulses 2+ and symmetrical  Pulmonary/Chest: Effort normal and breath sounds normal. No stridor. No respiratory distress. He has no wheezes. He has no rales. He exhibits no tenderness.  Abdominal: Soft. Bowel sounds are normal. He exhibits no distension and no mass. There is no tenderness. There is no rebound and no guarding.  Genitourinary: Rectum normal, prostate normal and penis normal. Guaiac negative stool. No penile tenderness.  Musculoskeletal: Normal range of motion. He exhibits no edema or tenderness.  Lymphadenopathy:    He has no cervical adenopathy.  Neurological: He is alert and oriented to person, place, and time. He has normal reflexes. No cranial nerve deficit. He exhibits normal muscle tone.  Skin: Skin is warm and dry. No rash noted. No erythema. No pallor.  Psychiatric: He has a normal mood and affect. His behavior is normal. Judgment and thought content normal.  Nursing note and vitals reviewed.  Assessment & Plan:  Hypertension ago continue current therapy  History of gout..... Continue allopurinol 300 mg daily  Adult ADD.............. continue Adderall 10 mg daily  BPH......... continue Proscar 5 mg daily  Rosacea................... doxycycline 100 mg twice a day when necessary  Hypothyroidism.......... continue Synthroid  Right upper quadrant pain  new problem.......[name] begin workup with ultrasound fat-free diet in the meantime.Marland KitchenMarland Kitchen

## 2015-05-28 NOTE — Progress Notes (Signed)
Pre visit review using our clinic review tool, if applicable. No additional management support is needed unless otherwise documented below in the visit note. 

## 2015-05-28 NOTE — Patient Instructions (Signed)
Continue current medications  Begin an exercise program.......... walk 30 minutes daily  Complete fat-free diet.......[name] we'll get you set up for an ultrasound of your gallbladder.....[name] I will call you and I get the report  Also we'll get you set up in GI to discuss screening for colon cancer  Return in one year sooner if any problems........ Tommi Rumps or Almyra Free are 2 new adult nurse practitioner's or Dr. Martinique

## 2015-08-01 ENCOUNTER — Telehealth: Payer: Self-pay | Admitting: *Deleted

## 2015-08-01 DIAGNOSIS — D2330 Other benign neoplasm of skin of unspecified part of face: Secondary | ICD-10-CM

## 2015-08-01 MED ORDER — DOXYCYCLINE HYCLATE 100 MG PO CAPS: ORAL_CAPSULE | ORAL | Status: DC

## 2015-08-01 MED ORDER — DICLOFENAC SODIUM 75 MG PO TBEC: 75.0000 mg | DELAYED_RELEASE_TABLET | Freq: Every day | ORAL | Status: DC | PRN

## 2015-08-01 NOTE — Telephone Encounter (Signed)
Rx sent and patient is aware. 

## 2015-08-01 NOTE — Telephone Encounter (Signed)
Patient is calling because he has had bursitis in his shoulder.  He has tried diclofenac 75 mg and it worked well.  He would like to know if he can get a prescription for this?

## 2015-08-28 ENCOUNTER — Telehealth: Payer: Self-pay | Admitting: Family Medicine

## 2015-08-28 NOTE — Telephone Encounter (Signed)
Pt call for an appt I told him Dr Sherren Mocha is out on Sabbatical and he ask if you would call him Jose Evans

## 2015-08-29 NOTE — Telephone Encounter (Signed)
Patient has shoulder pain.  Appointment made with Jose Evans.

## 2015-08-30 ENCOUNTER — Encounter: Payer: Self-pay | Admitting: Adult Health

## 2015-08-30 ENCOUNTER — Ambulatory Visit (INDEPENDENT_AMBULATORY_CARE_PROVIDER_SITE_OTHER): Payer: Medicare HMO | Admitting: Adult Health

## 2015-08-30 VITALS — BP 164/82 | Temp 98.4°F | Wt 204.6 lb

## 2015-08-30 DIAGNOSIS — F909 Attention-deficit hyperactivity disorder, unspecified type: Secondary | ICD-10-CM

## 2015-08-30 DIAGNOSIS — M25511 Pain in right shoulder: Secondary | ICD-10-CM

## 2015-08-30 DIAGNOSIS — R69 Illness, unspecified: Secondary | ICD-10-CM | POA: Diagnosis not present

## 2015-08-30 DIAGNOSIS — F988 Other specified behavioral and emotional disorders with onset usually occurring in childhood and adolescence: Secondary | ICD-10-CM

## 2015-08-30 MED ORDER — AMPHETAMINE-DEXTROAMPHETAMINE 10 MG PO TABS: 10.0000 mg | ORAL_TABLET | Freq: Every day | ORAL | Status: DC

## 2015-08-30 MED ORDER — METHYLPREDNISOLONE ACETATE 80 MG/ML IJ SUSP
120.0000 mg | Freq: Once | INTRAMUSCULAR | Status: AC
  Administered 2015-08-30: 120 mg via INTRAMUSCULAR

## 2015-08-30 NOTE — Progress Notes (Signed)
Subjective:    Patient ID: Jose Evans, male    DOB: February 26, 1946, 70 y.o.   MRN: [id number]  HPI  70 year old male who presents to the office today for an acute complaint of shoulder pain on the right side. He has had this pain on and off for the last six months. The pain is described as a " dull aching" pain. The pain radiates down the deltoid.   He has tried diclofenac 75 mg - he is unsure if it is helping.   Denies any numbness or tingling, loss of range of motion or strength.   Review of Systems  Constitutional: Negative.   Respiratory: Negative.   Cardiovascular: Negative.   Musculoskeletal: Positive for arthralgias. Negative for myalgias, back pain, joint swelling, neck pain and neck stiffness.  Skin: Negative.   All other systems reviewed and are negative.  Past Medical History  Diagnosis Date  . Hypertension   . Thyroid disease   . Gout   . Adult acne     Social History   Social History  . Marital Status: Married    Spouse Name: N/A  . Number of Children: N/A  . Years of Education: N/A   Occupational History  . Not on file.   Social History Main Topics  . Smoking status: Never Smoker   . Smokeless tobacco: Not on file  . Alcohol Use: No  . Drug Use: No  . Sexual Activity: Not on file   Other Topics Concern  . Not on file   Social History Narrative    Past Surgical History  Procedure Laterality Date  . Tonsillectomy    . Hernia repair      Family History  Problem Relation Age of Onset  . Hypertension Other     No Known Allergies  Current Outpatient Prescriptions on File Prior to Visit  Medication Sig Dispense Refill  . albuterol (PROVENTIL HFA;VENTOLIN HFA) 108 (90 BASE) MCG/ACT inhaler Inhale 2 puffs into the lungs every 6 (six) hours as needed for wheezing. 1 Inhaler 0  . allopurinol (ZYLOPRIM) 300 MG tablet Take 1 tablet (300 mg total) by mouth daily. 100 tablet 3  . diclofenac (VOLTAREN) 75 MG EC tablet Take 1 tablet (75 mg total) by  mouth daily as needed. 30 tablet 11  . doxycycline (VIBRAMYCIN) 100 MG capsule TAKE 1 CAPSULE (100 MG TOTAL) BY MOUTH 2 (TWO) TIMES DAILY. 30 capsule 3  . finasteride (PROSCAR) 5 MG tablet TAKE 1 TABLET EVERY DAY AS DIRECTED 100 tablet 3  . HYDROcodone-acetaminophen (NORCO) 7.5-325 MG per tablet Take 1 tablet by mouth every 6 (six) hours as needed for moderate pain. 30 tablet 0  . levothyroxine (SYNTHROID, LEVOTHROID) 150 MCG tablet Take 1 tablet (150 mcg total) by mouth daily. 90 tablet 3  . metoprolol (LOPRESSOR) 50 MG tablet Take 1 tablet (50 mg total) by mouth daily. 90 tablet 3  . neomycin-polymyxin-hydrocortisone (CORTISPORIN) otic solution Uses directed 10 mL 1  . triamterene-hydrochlorothiazide (MAXZIDE) 75-50 MG tablet Take 1 tablet by mouth daily. 90 tablet 3  . colchicine 0.6 MG tablet Take 1 tablet (0.6 mg total) by mouth daily. 90 tablet 3   No current facility-administered medications on file prior to visit.    BP 164/82 mmHg  Temp(Src) 98.4 F (36.9 C) (Oral)  Wt 204 lb 9.6 oz (92.806 kg)       Objective:   Physical Exam  Constitutional: He is oriented to person, place, and time. He appears well-developed and  well-nourished. No distress.  Musculoskeletal: Normal range of motion. He exhibits tenderness (to posterior scapula and right deltoid. ). He exhibits no edema.       Right shoulder: He exhibits tenderness. He exhibits no bony tenderness, no swelling, no effusion, no crepitus, no deformity and normal strength.  Is able to bring arm straight in front of him and to the side. Has pain when raising arm over head.   Neurological: He is alert and oriented to person, place, and time.  Skin: Skin is warm and dry. No rash noted. He is not diaphoretic. No erythema. No pallor.  Psychiatric: He has a normal mood and affect. His behavior is normal. Judgment and thought content normal.  Nursing note and vitals reviewed.     Assessment & Plan:    1. Right shoulder pain After  obtaining consent, and per orders of [employer], AGNP.  injection of 120 mg of Depo Medrol with 2 ml 2% plain lidocaine given by [name] was injected into the right shoulder bursa. Area was cleansed with alcohol and betadine. Analgesic spray was applied to the area for pain control. Patient tolerated  Procedure well. Follow up instructions reviewed with the patient.  - methylPREDNISolone acetate (DEPO-MEDROL) injection 120 mg; Inject 1.5 mLs (120 mg total) into the muscle once.  2. Attention deficit disorder  - amphetamine-dextroamphetamine (ADDERALL) 10 MG tablet; Take 1 tablet (10 mg total) by mouth daily.  Dispense: 30 tablet; Refill: 0 - amphetamine-dextroamphetamine (ADDERALL) 10 MG tablet; Take 1 tablet (10 mg total) by mouth daily.  Dispense: 30 tablet; Refill: 0 - amphetamine-dextroamphetamine (ADDERALL) 10 MG tablet; Take 1 tablet (10 mg total) by mouth daily.  Dispense: 30 tablet; Refill: 0  [name], NP

## 2015-08-30 NOTE — Patient Instructions (Addendum)
It was great meeting you today!  We have injected a steroid into your right shoulder. Give this steroid 24-36 hours to start working.   You can take ibuprofen or tylenol as needed.   Follow up if no improvement.   Bursitis Bursitis is inflammation and irritation of a bursa, which is one of the small, fluid-filled sacs that cushion and protect the moving parts of your body. These sacs are located between bones and muscles, muscle attachments, or skin areas next to bones. A bursa protects these structures from the wear and tear that results from frequent movement. An inflamed bursa causes pain and swelling. Fluid may build up inside the sac. Bursitis is most common near joints, especially the knees, elbows, hips, and shoulders. CAUSES Bursitis can be caused by:   Injury from:  A direct blow, like falling on your knee or elbow.  Overuse of a joint (repetitive stress).  Infection. This can happen if bacteria gets into a bursa through a cut or scrape near a joint.  Diseases that cause joint inflammation, such as gout and rheumatoid arthritis. RISK FACTORS You may be at risk for bursitis if you:   Have a job or hobby that involves a lot of repetitive stress on your joints.  Have a condition that weakens your body's defense system (immune system), such as diabetes, cancer, or HIV.  Lift and reach overhead often.  Kneel or lean on hard surfaces often.  Run or walk often. SIGNS AND SYMPTOMS The most common signs and symptoms of bursitis are:  Pain that gets worse when you move the affected body part or put weight on it.  Inflammation.  Stiffness. Other signs and symptoms may include:  Redness.  Tenderness.  Warmth.  Pain that continues after rest.  Fever and chills. This may occur in bursitis caused by infection. DIAGNOSIS Bursitis may be diagnosed by:   Medical history and physical exam.  MRI.  A procedure to drain fluid from the bursa with a needle (aspiration).  The fluid may be checked for signs of infection or gout.  Blood tests to rule out other causes of inflammation. TREATMENT  Bursitis can usually be treated at home with rest, ice, compression, and elevation (RICE). For mild bursitis, RICE treatment may be all you need. Other treatments may include:  Nonsteroidal anti-inflammatory drugs (NSAIDs) to treat pain and inflammation.  Corticosteroids to fight inflammation. You may have these drugs injected into and around the area of bursitis.  Aspiration of bursitis fluid to relieve pain and improve movement.  Antibiotic medicine to treat an infected bursa.  A splint, brace, or walking aid.  Physical therapy if you continue to have pain or limited movement.  Surgery to remove a damaged or infected bursa. This may be needed if you have a very bad case of bursitis or if other treatments have not worked. HOME CARE INSTRUCTIONS   Take medicines only as directed by your health care provider.  If you were prescribed an antibiotic medicine, finish it all even if you start to feel better.  Rest the affected area as directed by your health care provider.  Keep the area elevated.  Avoid activities that make pain worse.  Apply ice to the injured area:  Place ice in a plastic bag.  Place a towel between your skin and the bag.  Leave the ice on for 20 minutes, 2-3 times a day.  Use splints, braces, pads, or walking aids as directed by your health care provider.  Keep  all follow-up visits as directed by your health care provider. This is important. PREVENTION   Wear knee pads if you kneel often.  Wear sturdy running or walking shoes that fit you well.  Take regular breaks from repetitive activity.  Warm up by stretching before doing any strenuous activity.  Maintain a healthy weight or lose weight as recommended by your health care provider. Ask your health care provider if you need help.  Exercise regularly. Start any new physical  activity gradually. SEEK MEDICAL CARE IF:   Your bursitis is not responding to treatment or home care.  You have a fever.  You have chills.   This information is not intended to replace advice given to you by your health care provider. Make sure you discuss any questions you have with your health care provider.   Document Released: 04/04/2000 Document Revised: 12/27/2014 Document Reviewed: 06/27/2013 Elsevier Interactive Patient Education [employer].

## 2015-08-30 NOTE — Addendum Note (Signed)
Addended by: Apolinar Junes on: 08/30/2015 11:15 AM   Modules accepted: SmartSet

## 2015-11-11 ENCOUNTER — Other Ambulatory Visit: Payer: Self-pay | Admitting: Family Medicine

## 2015-11-12 NOTE — Telephone Encounter (Signed)
Rx refill sent to pharmacy. 

## 2016-01-03 ENCOUNTER — Telehealth: Payer: Self-pay | Admitting: *Deleted

## 2016-01-03 NOTE — Telephone Encounter (Signed)
Received PA request from [facility] for [facility]. PA was submitted and is pending. KEY: [id number]

## 2016-01-04 ENCOUNTER — Telehealth: Payer: Self-pay

## 2016-01-04 NOTE — Telephone Encounter (Signed)
Received PA Approval on adderal. Pharmacy aware.

## 2016-02-07 ENCOUNTER — Other Ambulatory Visit: Payer: Self-pay | Admitting: Family Medicine

## 2016-02-18 ENCOUNTER — Telehealth: Payer: Self-pay | Admitting: Family Medicine

## 2016-02-18 NOTE — Telephone Encounter (Signed)
Pt request refill  amphetamine-dextroamphetamine (ADDERALL) 10 MG tablet 90 day supply  Pt states he is a pt of [facility], but I do not see where he established.with Tommi Rumps. Ok to change?

## 2016-02-19 NOTE — Telephone Encounter (Signed)
He needs to establish with me first. I saw him for a visit while Dr. Sherren Mocha was out of the office at which time I gave him a one time dose of Adderall. He should be advised that if he comes to me that I am going to send him to a specialist for his ADHD medication

## 2016-02-19 NOTE — Telephone Encounter (Signed)
Noted  

## 2016-02-21 ENCOUNTER — Telehealth: Payer: Self-pay | Admitting: Family Medicine

## 2016-02-21 ENCOUNTER — Other Ambulatory Visit: Payer: Self-pay | Admitting: Emergency Medicine

## 2016-02-21 DIAGNOSIS — F909 Attention-deficit hyperactivity disorder, unspecified type: Secondary | ICD-10-CM

## 2016-02-21 MED ORDER — AMPHETAMINE-DEXTROAMPHETAMINE 10 MG PO TABS: 10.0000 mg | ORAL_TABLET | Freq: Every day | ORAL | 0 refills | Status: DC

## 2016-02-21 NOTE — Telephone Encounter (Signed)
° °  Pt request refill of the following: ° °amphetamine-dextroamphetamine (ADDERALL) 10 MG tablet ° ° °Phamacy: °

## 2016-02-21 NOTE — Telephone Encounter (Signed)
Called and spoke with pt. Pt was very upset about his prescription not being filled. He states that he "thought we as professional would act like professionals". Pt states that no one has told him that Dr. Sherren Mocha has returned to the office and that it shouldn't take a week to get a prescription filled. The last provider that he seen was [place] and he doesn't see why he couldn't fill his prescription. I informed pt that looking back at his notes yes he seen Tommi Rumps but he has not established care with Tommi Rumps and he is a provider that does not write Adderall. I informed that I would see if another provider would sign his prescription in Dr. Honor Junes absence.  Called and informed pt that prescription has been printed and up front ready for pick up. Pt states that he is sorry about the conversation earlier and he was'nt angry he was frustrated. I explained that I would leave a message for Dr. Sherren Mocha to contact patient. Pt verbalized understanding. Nothing further needed at this time.

## 2016-03-14 DIAGNOSIS — Z23 Encounter for immunization: Secondary | ICD-10-CM | POA: Diagnosis not present

## 2016-03-14 DIAGNOSIS — J019 Acute sinusitis, unspecified: Secondary | ICD-10-CM | POA: Diagnosis not present

## 2016-04-21 HISTORY — PX: SHOULDER SURGERY: SHX246

## 2016-05-06 ENCOUNTER — Other Ambulatory Visit: Payer: Self-pay | Admitting: Family Medicine

## 2016-05-11 ENCOUNTER — Other Ambulatory Visit: Payer: Self-pay | Admitting: Family Medicine

## 2016-06-09 ENCOUNTER — Other Ambulatory Visit (INDEPENDENT_AMBULATORY_CARE_PROVIDER_SITE_OTHER): Payer: Medicare HMO

## 2016-06-09 ENCOUNTER — Other Ambulatory Visit: Payer: Self-pay | Admitting: Family Medicine

## 2016-06-09 DIAGNOSIS — Z0000 Encounter for general adult medical examination without abnormal findings: Secondary | ICD-10-CM | POA: Diagnosis not present

## 2016-06-09 LAB — HEPATIC FUNCTION PANEL
ALBUMIN: 4.4 g/dL (ref 3.5–5.2)
ALT: 28 U/L (ref 0–53)
AST: 22 U/L (ref 0–37)
Alkaline Phosphatase: 54 U/L (ref 39–117)
Bilirubin, Direct: 0.2 mg/dL (ref 0.0–0.3)
TOTAL PROTEIN: 6.4 g/dL (ref 6.0–8.3)
Total Bilirubin: 0.8 mg/dL (ref 0.2–1.2)

## 2016-06-09 LAB — BASIC METABOLIC PANEL
BUN: 21 mg/dL (ref 6–23)
CHLORIDE: 101 meq/L (ref 96–112)
CO2: 27 meq/L (ref 19–32)
CREATININE: 0.94 mg/dL (ref 0.40–1.50)
Calcium: 9.5 mg/dL (ref 8.4–10.5)
GFR: 84.66 mL/min (ref >60.00)
GLUCOSE: 116 mg/dL — AB (ref 70–99)
Potassium: 3.5 mEq/L (ref 3.5–5.1)
SODIUM: 138 meq/L (ref 135–145)

## 2016-06-09 LAB — POC URINALSYSI DIPSTICK (AUTOMATED)
Bilirubin, UA: NEGATIVE
Blood, UA: NEGATIVE
Glucose, UA: NEGATIVE
Ketones, UA: NEGATIVE
Leukocytes, UA: NEGATIVE
NITRITE UA: NEGATIVE
PROTEIN UA: NEGATIVE
SPEC GRAV UA: 1.02
UROBILINOGEN UA: 0.2
pH, UA: 6

## 2016-06-09 LAB — LIPID PANEL
CHOLESTEROL: 172 mg/dL (ref 0–200)
HDL: 36.2 mg/dL — AB (ref >39.00)
LDL Cholesterol: 108 mg/dL — ABNORMAL HIGH (ref 0–99)
NonHDL: 135.57
TRIGLYCERIDES: 138 mg/dL (ref 0.0–149.0)
Total CHOL/HDL Ratio: 5
VLDL: 27.6 mg/dL (ref 0.0–40.0)

## 2016-06-09 LAB — CBC WITH DIFFERENTIAL/PLATELET
BASOS PCT: 1.1 % (ref 0.0–3.0)
Basophils Absolute: 0.1 10*3/uL (ref 0.0–0.1)
Eosinophils Absolute: 0.2 10*3/uL (ref 0.0–0.7)
Eosinophils Relative: 3.9 % (ref 0.0–5.0)
HEMATOCRIT: 44.8 % (ref 39.0–52.0)
Hemoglobin: 15.7 g/dL (ref 13.0–17.0)
LYMPHS ABS: 1.3 10*3/uL (ref 0.7–4.0)
LYMPHS PCT: 23.1 % (ref 12.0–46.0)
MCHC: 35 g/dL (ref 30.0–36.0)
MCV: 90.1 fl (ref 78.0–100.0)
MONOS PCT: 8.1 % (ref 3.0–12.0)
Monocytes Absolute: 0.5 10*3/uL (ref 0.1–1.0)
NEUTROS ABS: 3.7 10*3/uL (ref 1.4–7.7)
NEUTROS PCT: 63.8 % (ref 43.0–77.0)
PLATELETS: 207 10*3/uL (ref 150.0–400.0)
RBC: 4.98 Mil/uL (ref 4.22–5.81)
RDW: 14.4 % (ref 11.5–15.5)
WBC: 5.7 10*3/uL (ref 4.0–10.5)

## 2016-06-09 LAB — TSH: TSH: 0.55 u[IU]/mL (ref 0.35–4.50)

## 2016-06-09 LAB — PSA: PSA: 0.76 ng/mL (ref 0.10–4.00)

## 2016-06-17 ENCOUNTER — Encounter: Payer: Medicare HMO | Admitting: Family Medicine

## 2016-06-25 ENCOUNTER — Ambulatory Visit (INDEPENDENT_AMBULATORY_CARE_PROVIDER_SITE_OTHER): Payer: Medicare HMO | Admitting: Family Medicine

## 2016-06-25 ENCOUNTER — Encounter: Payer: Self-pay | Admitting: Family Medicine

## 2016-06-25 VITALS — BP 146/86 | Temp 98.0°F | Ht 69.0 in | Wt 203.0 lb

## 2016-06-25 DIAGNOSIS — D2230 Melanocytic nevi of unspecified part of face: Secondary | ICD-10-CM | POA: Diagnosis not present

## 2016-06-25 DIAGNOSIS — Z136 Encounter for screening for cardiovascular disorders: Secondary | ICD-10-CM | POA: Diagnosis not present

## 2016-06-25 DIAGNOSIS — E039 Hypothyroidism, unspecified: Secondary | ICD-10-CM | POA: Diagnosis not present

## 2016-06-25 DIAGNOSIS — D2330 Other benign neoplasm of skin of unspecified part of face: Secondary | ICD-10-CM

## 2016-06-25 DIAGNOSIS — M10079 Idiopathic gout, unspecified ankle and foot: Secondary | ICD-10-CM

## 2016-06-25 DIAGNOSIS — Z0000 Encounter for general adult medical examination without abnormal findings: Secondary | ICD-10-CM

## 2016-06-25 DIAGNOSIS — F988 Other specified behavioral and emotional disorders with onset usually occurring in childhood and adolescence: Secondary | ICD-10-CM | POA: Diagnosis not present

## 2016-06-25 DIAGNOSIS — R69 Illness, unspecified: Secondary | ICD-10-CM | POA: Diagnosis not present

## 2016-06-25 DIAGNOSIS — N401 Enlarged prostate with lower urinary tract symptoms: Secondary | ICD-10-CM

## 2016-06-25 DIAGNOSIS — R351 Nocturia: Secondary | ICD-10-CM | POA: Diagnosis not present

## 2016-06-25 DIAGNOSIS — Z23 Encounter for immunization: Secondary | ICD-10-CM

## 2016-06-25 DIAGNOSIS — I10 Essential (primary) hypertension: Secondary | ICD-10-CM | POA: Diagnosis not present

## 2016-06-25 MED ORDER — DOXYCYCLINE HYCLATE 100 MG PO CAPS: ORAL_CAPSULE | ORAL | 3 refills | Status: DC

## 2016-06-25 MED ORDER — ALLOPURINOL 300 MG PO TABS: 300.0000 mg | ORAL_TABLET | Freq: Every day | ORAL | 4 refills | Status: DC

## 2016-06-25 MED ORDER — HYDROCODONE-ACETAMINOPHEN 7.5-325 MG PO TABS: 1.0000 | ORAL_TABLET | Freq: Four times a day (QID) | ORAL | 0 refills | Status: DC | PRN

## 2016-06-25 MED ORDER — TRIAMTERENE-HCTZ 75-50 MG PO TABS: 1.0000 | ORAL_TABLET | Freq: Every day | ORAL | 4 refills | Status: DC

## 2016-06-25 MED ORDER — METOPROLOL TARTRATE 50 MG PO TABS: 50.0000 mg | ORAL_TABLET | Freq: Every day | ORAL | 4 refills | Status: DC

## 2016-06-25 MED ORDER — LEVOTHYROXINE SODIUM 150 MCG PO TABS: 150.0000 ug | ORAL_TABLET | Freq: Every day | ORAL | 4 refills | Status: DC

## 2016-06-25 MED ORDER — SILDENAFIL CITRATE 20 MG PO TABS: ORAL_TABLET | ORAL | 11 refills | Status: DC

## 2016-06-25 MED ORDER — AMPHETAMINE-DEXTROAMPHETAMINE 10 MG PO TABS: 10.0000 mg | ORAL_TABLET | Freq: Every day | ORAL | 0 refills | Status: DC

## 2016-06-25 MED ORDER — FINASTERIDE 5 MG PO TABS: ORAL_TABLET | ORAL | 3 refills | Status: DC

## 2016-06-25 NOTE — Progress Notes (Signed)
Jose Evans is a 71 year old married male nonsmoker who comes in today for general physical examination because of a history of gout, adult ADD, hypothyroidism, hypertension  He takes allopurinol 3 mg daily to prevent gout. He's been asymptomatic on the medication has not required any culture seen nor pain medication.  He takes Adderall 10 mg daily for adult ADD  He takes Vibramycin 100 mg twice a day when necessary for folliculitis of his scalp  He takes Proscar 5 mg daily for hair growth  He takes Synthroid 150 g daily because a history of hypothyroidism. TSH levels normal  He takes Lopressor 50 mg along with Maxide 75-50 daily for hypertension. BP here 146/86. We'll have him monitor his blood pressure at home to see if we need to change his medicine. BP goal 135/85 or less  Fasting blood sugar. Was 116.  He gets routine eye care, dental care. Currently has scheduled dental surgery for more. He needs a tooth extracted because he had an abscess. He's on amoxicillin. He says he has an upset stomach from the drug but no diarrhea.  He's always declined colonoscopy. He is willing to consider the noninvasive: Guard. I will refer him to GI to discuss that.  Vaccinations tetanus 1610 certain team. Did not get a flu shot last fall. We'll give him a Pneumovax today and recommend he get a shingles vaccine.  He has some mild ED. Go prescribed the generic Viagra.  14 point review of systems reviewed and otherwise negative  EKG was done because of a history of hypertension. EKG was normal and unchanged.  BP (!) 146/86 (BP Location: Right Arm, Patient Position: Sitting, Cuff Size: Large)   Temp 98 F (36.7 C) (Oral)   Ht 5\' 9"  (1.753 m)   Wt 203 lb (92.1 kg)   BMI 29.98 kg/m  Examination HEENT were negative neck was supple thyroid is not enlarged no carotid bruits cardiopulmonary exam normal abdominal exam normal genitalia normal circumcised male rectum normal stool guaiac-negative prostate smooth 2+  nonnodular BPH.. Father had prostate cancer diagnosed at age 57. PSA normal.  Extremities normal skin normal peripheral pulses normal.  #1 history of gout. Continue allopurinol  #2 history of adult ADD. Continue Adderall  #3 hypothyroidism. Continue Synthroid  Hypertension. Continue Lopressor and Maxide  #4. History of folliculitis doxycycline twice a day when necessary  #6 history of prostate cancer in father continue surveillance.

## 2016-06-25 NOTE — Patient Instructions (Signed)
Continue current medication  To be sure your blood pressure medicines working well I would recommend you check your blood pressure every day for 2 weeks.............. BP goal 135/85 or less......... is not at goal leave [place] a message on my chart and I will call you to alter your medication  Walk 30 minutes daily  Follow-up in one year sooner if any problems  Leave [place] a message on my chart 2 weeks from being out of your third prescription for Adderall for refills.

## 2016-06-25 NOTE — Progress Notes (Signed)
Pre visit review using our clinic review tool, if applicable. No additional management support is needed unless otherwise documented below in the visit note. 

## 2016-06-25 NOTE — Addendum Note (Signed)
Addended by: Miles Costain T on: 06/25/2016 04:23 PM   Modules accepted: Orders

## 2016-10-02 ENCOUNTER — Telehealth: Payer: Self-pay

## 2016-10-02 NOTE — Telephone Encounter (Signed)
Patient is on the list for [facility] and may be a good candidate for an AWV. Please let me know if/when appt is scheduled.   

## 2016-10-14 ENCOUNTER — Telehealth: Payer: Self-pay | Admitting: Family Medicine

## 2016-10-14 MED ORDER — AMPHETAMINE-DEXTROAMPHETAMINE 10 MG PO TABS: 10.0000 mg | ORAL_TABLET | Freq: Every day | ORAL | 0 refills | Status: DC

## 2016-10-14 NOTE — Telephone Encounter (Signed)
Pt needs new rx generic adderall 10 mg °

## 2016-10-14 NOTE — Telephone Encounter (Signed)
Ok to fill for 3 months?

## 2016-10-14 NOTE — Telephone Encounter (Signed)
Okay for 3-month refill 

## 2016-10-14 NOTE — Telephone Encounter (Signed)
Dr. Raliegh Ip signed and pt notified to pick up at the front desk.

## 2017-01-13 ENCOUNTER — Ambulatory Visit (INDEPENDENT_AMBULATORY_CARE_PROVIDER_SITE_OTHER): Payer: Medicare HMO | Admitting: Family Medicine

## 2017-01-13 ENCOUNTER — Encounter: Payer: Self-pay | Admitting: Family Medicine

## 2017-01-13 VITALS — BP 136/88 | HR 52 | Temp 98.6°F | Wt 203.0 lb

## 2017-01-13 DIAGNOSIS — M48062 Spinal stenosis, lumbar region with neurogenic claudication: Secondary | ICD-10-CM | POA: Diagnosis not present

## 2017-01-13 MED ORDER — TRAMADOL HCL 50 MG PO TABS: 50.0000 mg | ORAL_TABLET | Freq: Three times a day (TID) | ORAL | 1 refills | Status: DC | PRN

## 2017-01-13 NOTE — Patient Instructions (Signed)
We will get you set up a scan of your back ASAP. We'll also get you set up to see the neurosurgeons ASAP.

## 2017-01-13 NOTE — Progress Notes (Signed)
Jose Evans is a 71 year old married male nonsmoker who comes in today for evaluation of bilateral lower extremity pain from his knees to his ankles for the past 2 months  He says 2 months ago he woke up and noticed discomfort in his knees down to his ankles and both legs. Is not: History of trauma. He describes the discomfort is constant, dull, a 7 on a scale of 1-10. The pain does not radiate up his legs. When he walks it intensifies the pain.  No history of spinal trauma  Review of systems negative specifically no bowel or bladder dysfunction no fever chills night sweats weight loss etc.  He's tried over-the-counter medications to no avail  BP 136/88 (BP Location: Left Arm, Patient Position: Sitting, Cuff Size: Normal)   Pulse (!) 52   Temp 98.6 F (37 C) (Oral)   Wt 203 lb (92.1 kg)   SpO2 97%   BMI 29.98 kg/m  In general he is well-developed well-nourished male no acute distress examination of the spine in the sitting position is normal. In the supine position both legs were of equal length. Sensation..... Light touch..... Vibratory... Pinprick..... All within normal limits. Muscle strength normal reflexes....... knee so normal....... loss of both ankle reflexes  #1 right and left lower extremity pain 2 months exacerbated by walking. Symptoms consistent with spinal stenosis.........[name] begin workup..... Neurosurgical consult.

## 2017-01-22 ENCOUNTER — Other Ambulatory Visit: Payer: Medicare HMO

## 2017-01-24 ENCOUNTER — Other Ambulatory Visit: Payer: Self-pay | Admitting: Family Medicine

## 2017-01-26 ENCOUNTER — Telehealth: Payer: Self-pay

## 2017-01-26 ENCOUNTER — Other Ambulatory Visit: Payer: Self-pay

## 2017-01-26 MED ORDER — AMPHETAMINE-DEXTROAMPHETAMINE 10 MG PO TABS: 10.0000 mg | ORAL_TABLET | Freq: Every day | ORAL | 0 refills | Status: DC

## 2017-01-26 NOTE — Telephone Encounter (Signed)
Pt needs refill on Adderall. Pt's wife is coming in this afternoon for appt w/Dr. Sherren Mocha and he would like her to pick these up then.  Dr. Sherren Mocha - Please advise. Thanks!

## 2017-01-26 NOTE — Telephone Encounter (Signed)
Rx was printed, signed by dr Sherren Mocha and given to the wife at the office after her visit with dr Sherren Mocha today.

## 2017-01-27 NOTE — Telephone Encounter (Signed)
Rxs done. 

## 2017-02-19 ENCOUNTER — Ambulatory Visit
Admission: RE | Admit: 2017-02-19 | Discharge: 2017-02-19 | Disposition: A | Discharge status: Home / Self Care | Payer: Medicare HMO | Source: Ambulatory Visit | Attending: Family Medicine | Admitting: Family Medicine

## 2017-02-19 ENCOUNTER — Other Ambulatory Visit: Payer: Self-pay

## 2017-02-19 DIAGNOSIS — M4807 Spinal stenosis, lumbosacral region: Secondary | ICD-10-CM | POA: Diagnosis not present

## 2017-02-19 DIAGNOSIS — M4800 Spinal stenosis, site unspecified: Secondary | ICD-10-CM

## 2017-02-19 DIAGNOSIS — M48062 Spinal stenosis, lumbar region with neurogenic claudication: Secondary | ICD-10-CM

## 2017-02-25 DIAGNOSIS — L570 Actinic keratosis: Secondary | ICD-10-CM | POA: Diagnosis not present

## 2017-02-26 DIAGNOSIS — S91331A Puncture wound without foreign body, right foot, initial encounter: Secondary | ICD-10-CM | POA: Diagnosis not present

## 2017-03-02 DIAGNOSIS — M545 Low back pain: Secondary | ICD-10-CM | POA: Diagnosis not present

## 2017-03-02 DIAGNOSIS — M4316 Spondylolisthesis, lumbar region: Secondary | ICD-10-CM | POA: Diagnosis not present

## 2017-04-20 DIAGNOSIS — M4316 Spondylolisthesis, lumbar region: Secondary | ICD-10-CM | POA: Diagnosis not present

## 2017-05-12 ENCOUNTER — Telehealth: Payer: Self-pay | Admitting: Family Medicine

## 2017-05-12 ENCOUNTER — Other Ambulatory Visit: Payer: Self-pay

## 2017-05-12 MED ORDER — AMPHETAMINE-DEXTROAMPHETAMINE 10 MG PO TABS: 10.0000 mg | ORAL_TABLET | Freq: Every day | ORAL | 0 refills | Status: DC

## 2017-05-12 NOTE — Telephone Encounter (Signed)
Copied from [place]. Topic: Quick Communication - Rx Refill/Question >> May 12, 2017 10:54 AM Scherrie Gerlach wrote: Medication: amphetamine-dextroamphetamine (ADDERALL) 10 MG tablet 3 mo supply  Has the patient contacted their pharmacy? {no [id number] [address], [place] (Phone) [phone] (Fax)

## 2017-05-12 NOTE — Telephone Encounter (Signed)
Rx has been signed and is ready for pick up at the front desk, Pt is aware.

## 2017-05-12 NOTE — Telephone Encounter (Signed)
Rx has been printed waiting for the doctor signiture

## 2017-06-16 ENCOUNTER — Ambulatory Visit (INDEPENDENT_AMBULATORY_CARE_PROVIDER_SITE_OTHER): Payer: Medicare HMO | Admitting: Family Medicine

## 2017-06-16 ENCOUNTER — Encounter: Payer: Self-pay | Admitting: Family Medicine

## 2017-06-16 VITALS — BP 130/74 | HR 58 | Temp 98.2°F | Resp 16 | Wt 200.0 lb

## 2017-06-16 DIAGNOSIS — M25511 Pain in right shoulder: Secondary | ICD-10-CM

## 2017-06-16 DIAGNOSIS — G8929 Other chronic pain: Secondary | ICD-10-CM

## 2017-06-16 MED ORDER — PREDNISONE 20 MG PO TABS: ORAL_TABLET | ORAL | 1 refills | Status: DC

## 2017-06-16 NOTE — Patient Instructions (Signed)
Prednisone 20 mg......... 2 tabs 3 days then taper as outlined.  Return when necessary

## 2017-06-16 NOTE — Progress Notes (Signed)
Nicole Kindred is a 72 year old married male nonsmoker who comes in today for evaluation of pain in his right shoulder  He's had this problem off and on for many years. In 2017 he came in and got an injection of cortisone. It comes and goes with a past 3 weeks is unresponsive to his usual conservative therapy.  BP 130/74 (BP Location: Right Arm, Patient Position: Sitting, Cuff Size: Normal)   Pulse (!) 58   Temp 98.2 F (36.8 C) (Oral)   Resp 16   Wt 200 lb (90.7 kg)   SpO2 98%   BMI 29.53 kg/m  Well-developed well-nourished male no acute distress vital signs stable he is afebrile examination shoulder it appears normal. He has full range of motion. Tenderness in the biceps groove.  Impression biceps tendinitis........[name] prednisone burst and taper

## 2017-06-26 ENCOUNTER — Other Ambulatory Visit: Payer: Self-pay | Admitting: Family Medicine

## 2017-06-26 DIAGNOSIS — R351 Nocturia: Principal | ICD-10-CM

## 2017-06-26 DIAGNOSIS — N401 Enlarged prostate with lower urinary tract symptoms: Secondary | ICD-10-CM

## 2017-06-27 ENCOUNTER — Other Ambulatory Visit: Payer: Self-pay | Admitting: Family Medicine

## 2017-07-13 ENCOUNTER — Encounter: Payer: Self-pay | Admitting: Family Medicine

## 2017-07-13 ENCOUNTER — Ambulatory Visit (INDEPENDENT_AMBULATORY_CARE_PROVIDER_SITE_OTHER): Payer: Medicare HMO | Admitting: Family Medicine

## 2017-07-13 VITALS — BP 132/78 | HR 54 | Temp 97.6°F | Wt 196.0 lb

## 2017-07-13 DIAGNOSIS — G8929 Other chronic pain: Secondary | ICD-10-CM

## 2017-07-13 DIAGNOSIS — M25511 Pain in right shoulder: Secondary | ICD-10-CM | POA: Diagnosis not present

## 2017-07-13 NOTE — Progress Notes (Signed)
Jose Evans is a 71 year old married male nonsmoker who comes in today for reevaluation of right shoulder pain  We saw him a couple weeks ago with right shoulder pain. Etiology unknown. We examination at that time was normal. We felt like he had most likely bicipital tendinitis. He was started on Motrin and ice. After week or 10 days it got worse. We gave him a short course of prednisone which he's tapered off and now he just continues to have pain. He's also developed decreased range of motion which she's never had before..  BP 132/78 (BP Location: Left Arm, Patient Position: Sitting, Cuff Size: Normal)   Pulse (!) 54   Temp 97.6 F (36.4 C) (Oral)   Wt 196 lb (88.9 kg)   BMI 28.94 kg/m   Well-developed well-nourished male no acute distress vital signs stable he is afebrile examination of the right shoulder shows pain at the rotator cuff. He also has decreased range of motion .  #1 right shoulder pain unresponsive can third conservative therapy now decreased range of motion.......... Orthopedic consult with Dr. Durward Fortes

## 2017-07-13 NOTE — Patient Instructions (Signed)
For evaluation of the persistent right shoulder pain

## 2017-07-15 ENCOUNTER — Telehealth: Payer: Self-pay | Admitting: Family Medicine

## 2017-07-15 ENCOUNTER — Other Ambulatory Visit: Payer: Self-pay

## 2017-07-15 DIAGNOSIS — D2330 Other benign neoplasm of skin of unspecified part of face: Secondary | ICD-10-CM

## 2017-07-15 MED ORDER — DOXYCYCLINE HYCLATE 100 MG PO CAPS: ORAL_CAPSULE | ORAL | 3 refills | Status: DC

## 2017-07-15 NOTE — Telephone Encounter (Signed)
Rx for doxycycline has been sent to pt pharmacy as requested per Dr Sherren Mocha

## 2017-07-15 NOTE — Telephone Encounter (Signed)
Copied from [place]. Topic: Inquiry >> Jul 15, 2017 11:03 AM [name] wrote: Reason for CRM: Patient called requesting his doxycycline (VIBRAMYCIN) medication. Patient stated that Dr. Sherren Mocha was going to send this medication to his pharmacy on the same day as his last appt, but the medication has not been received. Someone from the office please contact this patient this morning please.       Thank You!!!

## 2017-07-15 NOTE — Telephone Encounter (Signed)
Copied from [place]. Topic: Inquiry >> Jul 15, 2017 11:03 AM [name] wrote: Reason for CRM: Patient called requesting his doxycycline (VIBRAMYCIN) medication. Patient stated that Dr. Sherren Mocha was going to send this medication to his pharmacy on the same day as his last appt, but the medication has not been received. Someone from the office please contact this patient this morning please.       Thank You!!!

## 2017-07-19 ENCOUNTER — Other Ambulatory Visit: Payer: Self-pay | Admitting: Family Medicine

## 2017-07-20 DIAGNOSIS — C801 Malignant (primary) neoplasm, unspecified: Secondary | ICD-10-CM

## 2017-07-20 HISTORY — DX: Malignant (primary) neoplasm, unspecified: C80.1

## 2017-07-21 ENCOUNTER — Encounter (INDEPENDENT_AMBULATORY_CARE_PROVIDER_SITE_OTHER): Payer: Self-pay | Admitting: Orthopaedic Surgery

## 2017-07-21 ENCOUNTER — Ambulatory Visit (INDEPENDENT_AMBULATORY_CARE_PROVIDER_SITE_OTHER): Payer: Medicare HMO

## 2017-07-21 ENCOUNTER — Ambulatory Visit (INDEPENDENT_AMBULATORY_CARE_PROVIDER_SITE_OTHER): Payer: Medicare HMO | Admitting: Orthopaedic Surgery

## 2017-07-21 DIAGNOSIS — M898X1 Other specified disorders of bone, shoulder: Secondary | ICD-10-CM | POA: Diagnosis not present

## 2017-07-21 DIAGNOSIS — M25511 Pain in right shoulder: Secondary | ICD-10-CM

## 2017-07-21 DIAGNOSIS — G8929 Other chronic pain: Secondary | ICD-10-CM | POA: Diagnosis not present

## 2017-07-21 NOTE — Progress Notes (Signed)
Office Visit Note   Patient: Jose Evans           Date of Birth: 10/21/1945           MRN: [id number] Visit Date: 07/21/2017              Requested by: Dorena Cookey, MD [place], [place] [id number] PCP: Dorena Cookey, MD   Assessment & Plan: Visit Diagnoses:  1. Pain of right scapula   2. Chronic right shoulder pain     Plan:  #1: MRI scan of the right shoulder to rule out rotator cuff tear #2: If this is not diagnostic in regards to the MRI of the right shoulder then probably need to proceed with an MRI scan of the cervical spine.  Follow-Up Instructions: No follow-ups on file.   Face-to-face time spent with patient was greater than 30 minutes.  Greater than 50% of the time was spent in counseling and coordination of care.  Orders:  Orders Placed This Encounter  Procedures  . XR Cervical Spine 2 or 3 views  . XR Shoulder Right  . MR SHOULDER RIGHT WO CONTRAST   No orders of the defined types were placed in this encounter.     Procedures: No procedures performed   Clinical Data: No additional findings.   Subjective: Chief Complaint  Patient presents with  . Right Shoulder - Pain  . New Patient (Initial Visit)    R SHOULDER PAIN FOR 10 YEARS OFF AND ON, NO SURGERY. 1 CORTISONE INJ 9 MONTHS AGO    HPI  Jose Evans is a 72 year old white male who is seen today for evaluation of his right shoulder.  He states he has had 10 years a history of problems with his right shoulder.  He has had no shoulder surgery.  He did have a cortisone injection around 9 months ago which did improve his symptoms until recently.  His biggest problem is made to start the motion of his shoulder with the arm at his side.  As he tries to forward flex he has difficulty getting it there.  Once he can take his other arm in position the right arm in space then he has good strength.  Denies any numbness or tingling.  He does have pain up into the scapular area and to the  right lateral aspect of the neck.  Also pain in the upper arm on the right.  Some tenderness and pain over the biceps tendon by history.   Review of Systems  Constitutional: Negative for fatigue and fever.  HENT: Negative for ear pain.   Eyes: Negative for pain.  Respiratory: Negative for cough and shortness of breath.   Cardiovascular: Negative for leg swelling.  Gastrointestinal: Negative for blood in stool, constipation and diarrhea.  Genitourinary: Negative for difficulty urinating.  Musculoskeletal: Positive for back pain and neck pain.  Skin: Negative for rash and wound.  Allergic/Immunologic: Negative for food allergies.  Neurological: Positive for weakness. Negative for dizziness, light-headedness, numbness and headaches.  Hematological: Bruises/bleeds easily.  Psychiatric/Behavioral: Positive for suicidal ideas.     Objective: Vital Signs: BP 140/86 (BP Location: Right Arm, Patient Position: Sitting, Cuff Size: Normal)   Pulse (!) 58   Resp 16   Ht 5\' 10"  (1.778 m)   Wt 188 lb (85.3 kg)   BMI 26.98 kg/m   Physical Exam  Constitutional: He is oriented to person, place, and time. He appears well-developed and well-nourished.  HENT:  Head: Normocephalic and atraumatic.  Eyes: Pupils are equal, round, and reactive to light. EOM are normal.  Pulmonary/Chest: Effort normal.  Neurological: He is alert and oriented to person, place, and time.  Skin: Skin is warm and dry.  Psychiatric: He has a normal mood and affect. His behavior is normal. Judgment and thought content normal.    Ortho Exam  Exam today of the right shoulder reveals approximately 170 degrees of abduction and forward flexion.  With the arm at 90 degrees of abduction he has 90 degrees of external rotation 70 degrees of internal rotation.  He has negative empty can test.  He does have some crepitance at the [facility] area with internal and external rotation of the shoulder.  He has good strength in abduction as well as  forward flexion.  Good grip strength slightly decreased on the right in comparison left.  He has good strength in the shoulder girdle bilaterally.  He however does have significant atrophy supraspinatus of the scapula in comparison to the left shoulder.  Does have tenderness over the biceps tendon proximally.  Cervical spine reveals limitation of rotation to about 45degrees right and left.  Backward extension to around 40 degrees.  Forward flexion can almost touch his chin to chest.  Deep tendon reflexes were 1+ bilateral symmetric in the biceps and brachial radialis.  Trace in the triceps.   Specialty Comments:  No specialty comments available.  Imaging: Xr Cervical Spine 2 Or 3 Views  Result Date: 07/21/2017 2 view x-ray of the cervical spine reveals some torsion of the C-spine.  Marked spinal stenosis.  He does have C2-3 marked joint space narrowing.  C4-5 grade 1 spinal listhesis.  Anterior spurring 5-6,6-7.  Marked disc space narrowing at that level.  Xr Shoulder Right  Result Date: 07/21/2017 Through the right shoulder reveals a type II acromion.  Some cystic changes in the acromion was noted at the [facility] joint.  Does have maintenance of the glenohumeral joint.  Good joint space.    PMFS History: Patient Active Problem List   Diagnosis Date Noted  . Chronic right shoulder pain 07/13/2017  . Spinal stenosis of lumbar region with neurogenic claudication 01/13/2017  . Routine general medical examination at a health care facility 05/28/2015  . Capillary hemangioma of skin 03/13/2014  . Dysplastic nevus of face 07/01/2011  . SEBORRHEIC KERATOSIS, INFLAMED 05/06/2010  . Attention deficit disorder 11/21/2009  . Hypothyroidism 12/01/2006  . Gout 12/01/2006  . Essential hypertension 12/01/2006   Past Medical History:  Diagnosis Date  . Adult acne   . Gout   . Hypertension   . Thyroid disease     Family History  Problem Relation Age of Onset  . Hypertension Other     Past Surgical  History:  Procedure Laterality Date  . HERNIA REPAIR    . TONSILLECTOMY     Social History   Occupational History  . Not on file  Tobacco Use  . Smoking status: Former Research scientist (life sciences)  . Smokeless tobacco: Never Used  Substance and Sexual Activity  . Alcohol use: No  . Drug use: No  . Sexual activity: Not on file

## 2017-08-02 ENCOUNTER — Ambulatory Visit
Admission: RE | Admit: 2017-08-02 | Discharge: 2017-08-02 | Disposition: A | Discharge status: Home / Self Care | Payer: Medicare HMO | Source: Ambulatory Visit | Attending: Orthopedic Surgery | Admitting: Orthopedic Surgery

## 2017-08-02 DIAGNOSIS — M25511 Pain in right shoulder: Principal | ICD-10-CM

## 2017-08-02 DIAGNOSIS — G8929 Other chronic pain: Secondary | ICD-10-CM

## 2017-08-03 ENCOUNTER — Ambulatory Visit (INDEPENDENT_AMBULATORY_CARE_PROVIDER_SITE_OTHER): Payer: Medicare HMO | Admitting: Orthopaedic Surgery

## 2017-08-03 ENCOUNTER — Encounter (INDEPENDENT_AMBULATORY_CARE_PROVIDER_SITE_OTHER): Payer: Self-pay | Admitting: Orthopaedic Surgery

## 2017-08-03 VITALS — BP 152/91 | HR 53 | Resp 18 | Ht 70.0 in | Wt 187.0 lb

## 2017-08-03 DIAGNOSIS — M25511 Pain in right shoulder: Secondary | ICD-10-CM

## 2017-08-03 DIAGNOSIS — G8929 Other chronic pain: Secondary | ICD-10-CM

## 2017-08-03 MED ORDER — OXYCODONE HCL 5 MG PO CAPS: 5.0000 mg | ORAL_CAPSULE | ORAL | 0 refills | Status: DC | PRN

## 2017-08-03 NOTE — Progress Notes (Signed)
Office Visit Note   Patient: Jose Evans           Date of Birth: 18-Jan-1946           MRN: [id number] Visit Date: 08/03/2017              Requested by: Dorena Cookey, MD [place], [place] [address] PCP: Dorena Cookey, MD   Assessment & Plan: Visit Diagnoses:  1. Chronic right shoulder pain     Plan: MRI scan demonstrates a full-thickness full width tear of the supraspinatus tendon with 2.4 cm of retraction.  Some fraying of the distal infraspinatus.  Partial thickness tearing of the superior subscapularis tendon.  Thinning of the biceps long head mild arthropathy of the acromioclavicular joint long discussion regarding the findings.  I would suggest surgery to include an arthroscopic SCD, DCR and mini open rotator cuff tear repair.  I discussed the surgery, anesthesia, recovery, use of sling and need for physical therapy.  Has had trouble sleeping at night and will prescribe oxycodone until surgery is scheduled  Follow-Up Instructions: Return will schedule surgery.   Orders:  No orders of the defined types were placed in this encounter.  Meds ordered this encounter  Medications  . oxycodone (OXY-IR) 5 MG capsule    Sig: Take 1 capsule (5 mg total) by mouth every 4 (four) hours as needed.    Dispense:  30 capsule    Refill:  0      Procedures: No procedures performed   Clinical Data: No additional findings.   Subjective: Chief Complaint  Patient presents with  . Right Shoulder - Follow-up  . Follow-up    RIGHT SHOULDER MRI REVIEW  Long history of right shoulder pain as previously outlined.  Continues to have pain to the point of compromise.  MRI ordered and performed.  Results as above.  HPI  Review of Systems  Constitutional: Negative for fatigue and fever.  HENT: Negative for ear pain.   Eyes: Negative for pain.  Respiratory: Negative for cough and shortness of breath.   Cardiovascular: Negative for leg swelling.  Gastrointestinal:  Negative for constipation and diarrhea.  Genitourinary: Negative for difficulty urinating.  Musculoskeletal: Positive for neck pain. Negative for back pain.  Skin: Negative for rash.  Allergic/Immunologic: Negative for food allergies.  Neurological: Positive for weakness and numbness.  Hematological: Bruises/bleeds easily.  Psychiatric/Behavioral: Positive for sleep disturbance.     Objective: Vital Signs: BP (!) 152/91 (BP Location: Right Arm, Patient Position: Sitting, Cuff Size: Normal)   Pulse (!) 53   Resp 18   Ht 5\' 10"  (1.778 m)   Wt 187 lb (84.8 kg)   BMI 26.83 kg/m   Physical Exam  Ortho Exam Mr. Helfand is awake alert and oriented x3.  Comfortable sitting.  Able to raise right arm overhead in a circuitous arc of motion.  Tenderness along the anterior subacromial region.  Speed sign negative.  Biceps intact.  Skin intact.  Good strength.  Positive impingement and empty can testing.  No apprehension with motion Specialty Comments:  No specialty comments available.  Imaging: No results found.   PMFS History: Patient Active Problem List   Diagnosis Date Noted  . Chronic right shoulder pain 07/13/2017  . Spinal stenosis of lumbar region with neurogenic claudication 01/13/2017  . Routine general medical examination at a health care facility 05/28/2015  . Capillary hemangioma of skin 03/13/2014  . Dysplastic nevus of face 07/01/2011  . SEBORRHEIC KERATOSIS,  INFLAMED 05/06/2010  . Attention deficit disorder 11/21/2009  . Hypothyroidism 12/01/2006  . Gout 12/01/2006  . Essential hypertension 12/01/2006   Past Medical History:  Diagnosis Date  . Adult acne   . Gout   . Hypertension   . Thyroid disease     Family History  Problem Relation Age of Onset  . Hypertension Other     Past Surgical History:  Procedure Laterality Date  . HERNIA REPAIR    . TONSILLECTOMY     Social History   Occupational History  . Not on file  Tobacco Use  . Smoking status: Former  Research scientist (life sciences)  . Smokeless tobacco: Never Used  Substance and Sexual Activity  . Alcohol use: No  . Drug use: No  . Sexual activity: Not on file

## 2017-08-17 ENCOUNTER — Other Ambulatory Visit: Payer: Self-pay | Admitting: Family Medicine

## 2017-08-17 ENCOUNTER — Other Ambulatory Visit: Payer: Self-pay

## 2017-08-17 MED ORDER — AMPHETAMINE-DEXTROAMPHETAMINE 10 MG PO TABS: 10.0000 mg | ORAL_TABLET | Freq: Every day | ORAL | 0 refills | Status: DC

## 2017-08-24 ENCOUNTER — Inpatient Hospital Stay (INDEPENDENT_AMBULATORY_CARE_PROVIDER_SITE_OTHER): Payer: Medicare HMO | Admitting: Orthopaedic Surgery

## 2017-08-27 ENCOUNTER — Encounter: Payer: Self-pay | Admitting: Orthopaedic Surgery

## 2017-08-27 DIAGNOSIS — M7541 Impingement syndrome of right shoulder: Secondary | ICD-10-CM | POA: Diagnosis not present

## 2017-08-27 DIAGNOSIS — M659 Synovitis and tenosynovitis, unspecified: Secondary | ICD-10-CM | POA: Diagnosis not present

## 2017-08-27 DIAGNOSIS — M7521 Bicipital tendinitis, right shoulder: Secondary | ICD-10-CM | POA: Diagnosis not present

## 2017-08-27 DIAGNOSIS — M75121 Complete rotator cuff tear or rupture of right shoulder, not specified as traumatic: Secondary | ICD-10-CM | POA: Diagnosis not present

## 2017-08-27 DIAGNOSIS — G8918 Other acute postprocedural pain: Secondary | ICD-10-CM | POA: Diagnosis not present

## 2017-08-27 DIAGNOSIS — M19011 Primary osteoarthritis, right shoulder: Secondary | ICD-10-CM | POA: Diagnosis not present

## 2017-08-31 ENCOUNTER — Encounter (INDEPENDENT_AMBULATORY_CARE_PROVIDER_SITE_OTHER): Payer: Self-pay | Admitting: Orthopaedic Surgery

## 2017-08-31 ENCOUNTER — Ambulatory Visit (INDEPENDENT_AMBULATORY_CARE_PROVIDER_SITE_OTHER): Payer: Medicare HMO | Admitting: Orthopaedic Surgery

## 2017-08-31 ENCOUNTER — Other Ambulatory Visit: Payer: Self-pay | Admitting: Family Medicine

## 2017-08-31 VITALS — BP 154/95 | HR 68 | Ht 70.0 in | Wt 184.6 lb

## 2017-08-31 DIAGNOSIS — G8929 Other chronic pain: Secondary | ICD-10-CM

## 2017-08-31 DIAGNOSIS — M25511 Pain in right shoulder: Secondary | ICD-10-CM

## 2017-08-31 NOTE — Progress Notes (Signed)
Office Visit Note   Patient: Jose Evans           Date of Birth: 1945-12-09           MRN: [id number] Visit Date: 08/31/2017              Requested by: Dorena Cookey, MD [place], [place] [id number] PCP: Dorena Cookey, MD   Assessment & Plan: Visit Diagnoses:  1. Chronic right shoulder pain     Plan: 4 days status post arthroscopic SCD DCR right shoulder with a mini open rotator cuff tear repair and biceps tenodesis.  Doing well.  Is only used half of the pain medicine.  Begin circumduction exercises.  Return 1 week and initiate physical therapy  Follow-Up Instructions: Return in about 1 week (around 09/07/2017).   Orders:  No orders of the defined types were placed in this encounter.  No orders of the defined types were placed in this encounter.     Procedures: No procedures performed   Clinical Data: No additional findings.   Subjective: Chief Complaint  Patient presents with  . Right Shoulder - Routine Post Op  . Post-op Follow-up    08/27/17 POST OP R SHOULDER HAVING RASH ON ARM    HPI  Review of Systems  Constitutional: Positive for fatigue. Negative for fever.  HENT: Negative for ear pain.   Eyes: Negative for pain.  Respiratory: Negative for cough and shortness of breath.   Cardiovascular: Negative for leg swelling.  Gastrointestinal: Negative for constipation and diarrhea.  Genitourinary: Negative for difficulty urinating.  Musculoskeletal: Negative for back pain and neck pain.  Skin: Positive for rash.  Allergic/Immunologic: Negative for food allergies.  Neurological: Positive for weakness. Negative for numbness.  Hematological: Bruises/bleeds easily.  Psychiatric/Behavioral: Positive for sleep disturbance.     Objective: Vital Signs: BP (!) 154/95 (BP Location: Left Arm, Patient Position: Sitting, Cuff Size: Normal)   Pulse 68   Ht 5\' 10"  (1.778 m)   Wt 184 lb 9.6 oz (83.7 kg)   BMI 26.49 kg/m   Physical  Exam  Ortho Exam awake alert and oriented x3.  Comfortable sitting.  Dressing right shoulder was changed.  New Steri-Strips applied.  No active drainage.  Minimal swelling.  Ecchymosis present from surgery which is normal.  Neurovascular exam intact.  Has developed a small rash along the volar aspect of the forearm possibly related to allergic reaction to the sling.  Can wash the sling and mild detergent.  Specialty Comments:  No specialty comments available.  Imaging: No results found.   PMFS History: Patient Active Problem List   Diagnosis Date Noted  . Chronic right shoulder pain 07/13/2017  . Spinal stenosis of lumbar region with neurogenic claudication 01/13/2017  . Routine general medical examination at a health care facility 05/28/2015  . Capillary hemangioma of skin 03/13/2014  . Dysplastic nevus of face 07/01/2011  . SEBORRHEIC KERATOSIS, INFLAMED 05/06/2010  . Attention deficit disorder 11/21/2009  . Hypothyroidism 12/01/2006  . Gout 12/01/2006  . Essential hypertension 12/01/2006   Past Medical History:  Diagnosis Date  . Adult acne   . Gout   . Hypertension   . Thyroid disease     Family History  Problem Relation Age of Onset  . Hypertension Other     Past Surgical History:  Procedure Laterality Date  . HERNIA REPAIR    . SHOULDER SURGERY    . TONSILLECTOMY     Social History  Occupational History  . Not on file  Tobacco Use  . Smoking status: Former Research scientist (life sciences)  . Smokeless tobacco: Current User    Types: Chew  Substance and Sexual Activity  . Alcohol use: No  . Drug use: No  . Sexual activity: Not on file

## 2017-09-07 ENCOUNTER — Ambulatory Visit (INDEPENDENT_AMBULATORY_CARE_PROVIDER_SITE_OTHER): Payer: Medicare HMO | Admitting: Orthopaedic Surgery

## 2017-09-07 ENCOUNTER — Encounter (INDEPENDENT_AMBULATORY_CARE_PROVIDER_SITE_OTHER): Payer: Self-pay | Admitting: Orthopaedic Surgery

## 2017-09-07 VITALS — BP 154/83 | HR 86 | Resp 14 | Ht 70.0 in | Wt 184.0 lb

## 2017-09-07 DIAGNOSIS — M25511 Pain in right shoulder: Secondary | ICD-10-CM

## 2017-09-07 DIAGNOSIS — G8929 Other chronic pain: Secondary | ICD-10-CM

## 2017-09-07 NOTE — Progress Notes (Signed)
Office Visit Note   Patient: Jose Evans           Date of Birth: [birth year]           MRN: [id number] Visit Date: 09/07/2017              Requested by: Dorena Cookey, MD [place], [place] [address] PCP: Dorena Cookey, MD   Assessment & Plan: Visit Diagnoses:  1. Chronic right shoulder pain     Plan: 11 days status post rotator cuff tear repair right shoulder.  Doing quite well.  Wearing a sling.  Start physical therapy and plan to check him back in 2 or 3 weeks  Follow-Up Instructions: Return in about 3 months (around 12/08/2017).   Orders:  Orders Placed This Encounter  Procedures  . Ambulatory referral to Physical Therapy   No orders of the defined types were placed in this encounter.     Procedures: No procedures performed   Clinical Data: No additional findings.   Subjective: Chief Complaint  Patient presents with  . Right Shoulder - Routine Post Op  . Routine Post Op    Right shoulder RCT, Bicep 08/27/17, some pain, twitching, IBU,Tylenol, Aleve helps some    HPI  Review of Systems  Constitutional: Positive for activity change.  HENT: Negative for trouble swallowing.   Eyes: Negative for pain.  Respiratory: Negative for shortness of breath.   Cardiovascular: Negative for leg swelling.  Gastrointestinal: Negative for constipation.  Endocrine: Negative for cold intolerance.  Genitourinary: Negative for difficulty urinating.  Musculoskeletal: Negative for joint swelling.  Skin: Negative for rash.  Allergic/Immunologic: Negative for food allergies.  Neurological: Positive for weakness.  Hematological: Does not bruise/bleed easily.  Psychiatric/Behavioral: Positive for sleep disturbance.     Objective: Vital Signs: BP (!) 154/83 (BP Location: Left Arm, Patient Position: Sitting, Cuff Size: Normal)   Pulse 86   Resp 14   Ht 5\' 10"  (1.778 m)   Wt 184 lb (83.5 kg)   BMI 26.40 kg/m   Physical Exam  Ortho Exam awake alert  and oriented x3.  Comfortable sitting.  No distress.  Incisions right shoulder healing without problem.  Ecchymosis resolving.  Biceps looks in the joint position.  Neurovascular exam intact.  I could abduct to 90 degrees and flex over 100 degrees of discomfort.  Specialty Comments:  No specialty comments available.  Imaging: No results found.   PMFS History: Patient Active Problem List   Diagnosis Date Noted  . Chronic right shoulder pain 07/13/2017  . Spinal stenosis of lumbar region with neurogenic claudication 01/13/2017  . Routine general medical examination at a health care facility 05/28/2015  . Capillary hemangioma of skin 03/13/2014  . Dysplastic nevus of face 07/01/2011  . SEBORRHEIC KERATOSIS, INFLAMED 05/06/2010  . Attention deficit disorder 11/21/2009  . Hypothyroidism 12/01/2006  . Gout 12/01/2006  . Essential hypertension 12/01/2006   Past Medical History:  Diagnosis Date  . Adult acne   . Gout   . Hypertension   . Thyroid disease     Family History  Problem Relation Age of Onset  . Hypertension Other     Past Surgical History:  Procedure Laterality Date  . HERNIA REPAIR    . SHOULDER SURGERY    . TONSILLECTOMY     Social History   Occupational History  . Not on file  Tobacco Use  . Smoking status: Former Smoker    Packs/day: 0.50    Years: 30.00  Pack years: 15.00    Types: Cigarettes    Last attempt to quit: 09/07/2005    Years since quitting: 12.0  . Smokeless tobacco: Never Used  Substance and Sexual Activity  . Alcohol use: No  . Drug use: No  . Sexual activity: Not on file

## 2017-09-08 ENCOUNTER — Other Ambulatory Visit: Payer: Self-pay

## 2017-09-08 ENCOUNTER — Ambulatory Visit: Payer: Medicare HMO | Attending: Orthopaedic Surgery

## 2017-09-08 DIAGNOSIS — M25511 Pain in right shoulder: Secondary | ICD-10-CM | POA: Insufficient documentation

## 2017-09-08 DIAGNOSIS — R293 Abnormal posture: Secondary | ICD-10-CM | POA: Insufficient documentation

## 2017-09-08 DIAGNOSIS — M6281 Muscle weakness (generalized): Secondary | ICD-10-CM | POA: Diagnosis not present

## 2017-09-08 DIAGNOSIS — M25611 Stiffness of right shoulder, not elsewhere classified: Secondary | ICD-10-CM | POA: Diagnosis not present

## 2017-09-08 NOTE — Therapy (Signed)
[facility] [address], [place], [place], [address] Phone: [phone]   Fax:  [phone]  Physical Therapy Evaluation  Patient Details  Name: Jose Evans MRN: [id number] Date of Birth: Apr 23, 1945 Referring Provider: Joni Fears, MD   Encounter Date: 09/08/2017  PT End of Session - 09/08/17 1326    Visit Number  1    Date for PT Re-Evaluation  11/03/17    Authorization Type  Medicare    PT Start Time  1147    PT Stop Time  1228    PT Time Calculation (min)  41 min    Activity Tolerance  Patient tolerated treatment well    Behavior During Therapy  [facility] for tasks assessed/performed       Past Medical History:  Diagnosis Date  . Adult acne   . Cancer ([place]) 07/2017   skin cancer  . Gout   . Hypertension   . Thyroid disease     Past Surgical History:  Procedure Laterality Date  . HERNIA REPAIR    . SHOULDER SURGERY    . TONSILLECTOMY      There were no vitals filed for this visit.   Subjective Assessment - 09/08/17 1149    Subjective  Pt presents to PT s/p Rt RTC repair and biceps tenodesis performed 08/27/17.  Pt reports multiple injuries over the years to the Rt shoulder and RTC tear was degenerative.  Pt is wearing strap to serve as sling and MD has ordered PROM.    Pertinent History  Rt RTC repair: 08/27/17. PROM only per MD    Patient Stated Goals  return to regular use of Rt UE, improve strength and A/ROM    Currently in Pain?  Yes    Pain Score  7     Pain Location  Shoulder    Pain Orientation  Right    Pain Descriptors / Indicators  Aching    Pain Type  Surgical pain    Pain Onset  More than a month ago    Pain Frequency  Constant    Aggravating Factors   stress, constant, movement    Pain Relieving Factors  pain medication, ice, rest    Effect of Pain on Daily Activities  not able to use Rt UE         [facility] PT Assessment - 09/08/17 0001      Assessment   Medical Diagnosis   chronic Rt shoulder pain- s/p Rt RTC repair    Referring Provider  Joni Fears, MD    Onset Date/Surgical Date  08/27/17    Hand Dominance  Right    Next MD Visit  not scheduled    Prior Therapy  none      Precautions   Precautions  Shoulder    Type of Shoulder Precautions  post op RTC repair- follow protocol and MD orders    Precaution Comments  PROM per MD for now    Required Braces or Orthoses  Sling      Restrictions   Weight Bearing Restrictions  No      Balance Screen   Has the patient fallen in the past 6 months  No    Has the patient had a decrease in activity level because of a fear of falling?   No    Is the patient reluctant to leave their home because of a fear of falling?   No      Home Environment   Living  Environment  Private residence    Garment/textile technologist other      Prior Function   Level of Independence  Independent    Vocation  Full time employment    Programme researcher, broadcasting/film/video, lifting, computer work    Leisure  hiking      Cognition   Overall Cognitive Status  Within Functional Limits for tasks assessed      Observation/Other Assessments   Focus on Therapeutic Outcomes (FOTO)   70% limitation      Posture/Postural Control   Posture/Postural Control  Postural limitations    Postural Limitations  Forward head;Rounded Shoulders      ROM / Strength   AROM / PROM / Strength  AROM;PROM;Strength      AROM   Overall AROM   Due to precautions      PROM   Overall PROM   Deficits    Overall PROM Comments  Lt shoulder A/ROM full    PROM Assessment Site  Shoulder    Right/Left Shoulder  Right    Right Shoulder Flexion  115 Degrees    Right Shoulder ABduction  70 Degrees limited by protocol    Right Shoulder Internal Rotation  65 Degrees    Right Shoulder External Rotation  30 Degrees limited by protocol      Strength   Overall Strength  Deficits;Due to precautions    Strength Assessment Site  Hand    Right/Left hand   Left;Right    Right Hand Grip (lbs)  80#    Left Hand Grip (lbs)  94#      Palpation   Palpation comment  healing surgical incision over Rt anterior glenohumeral joint.  Tender to palpation over Rt upper trap, anterior glenohumeral joint and biceps proximal insertion      Ambulation/Gait   Gait Pattern  Within Functional Limits                Objective measurements completed on examination: See above findings.              PT Education - 09/08/17 1143    Education provided  Yes    Education Details  sling exercises  Access Code: [email or web address]     Person(s) Educated  Patient    Methods  Demonstration;Explanation;Handout    Comprehension  Verbalized understanding;Returned demonstration       PT Short Term Goals - 09/08/17 1127      PT SHORT TERM GOAL #1   Title  be independent in initial HEP    Time  4    Period  Weeks    Status  New    Target Date  10/06/17      PT SHORT TERM GOAL #2   Title  wean from sling as allowed by MD and verbalize allowed use of Rt UE with home tasks     Time  4    Period  Weeks    Status  New    Target Date  10/06/17      PT SHORT TERM GOAL #3   Title  demonstrate Rt shoulder flexion P/ROM to 140 degrees to improve mobility     Time  4    Period  Weeks    Status  New    Target Date  10/06/17      PT SHORT TERM GOAL #4   Title  reduce Rt shoulder pain to allow for full night of sleep    Time  4  Period  Weeks    Status  New    Target Date  10/06/17        PT Long Term Goals - 09/08/17 1127      PT LONG TERM GOAL #1   Title  be independent in advanced HEP    Time  8    Period  Weeks    Status  New    Target Date  11/03/17      PT LONG TERM GOAL #2   Title  reduce FOTO < 36% limitation     Time  8    Period  Weeks    Status  New    Target Date  11/03/17      PT LONG TERM GOAL #3   Title  demonstrate Rt shoulder A/ROM flexion to > or = to 120 degrees to allow for reaching overhead    Time  8    Period   Weeks    Status  New    Target Date  11/03/17      PT LONG TERM GOAL #4   Title  demonstate 4-/5 Rt shoulder strength to improve use and endurance    Time  8    Period  Weeks    Status  New    Target Date  11/03/17      PT LONG TERM GOAL #5   Title  demonstrate Rt shoulder A/ROM ER and IR to allow for self-care activities without limitation or substitution    Time  8    Period  Weeks    Status  New    Target Date  11/03/17      Additional Long Term Goals   Additional Long Term Goals  Yes      PT LONG TERM GOAL #6   Title  report < or = to 3/10 Rt shoulder pain with use for home and work tasks    Time  8    Period  Weeks    Status  New    Target Date  11/03/17             Plan - 09/08/17 1229    Clinical Impression Statement  Pt presents to PT s/p Rt RTC repair performed 08/27/17.  Pt has been wearing sling since surgery.  Pt demonstrates poor seated posture, Rt scapular elevation in sitting and fair control of scapular musculature on the Rt.  Pt with limited P/ROM of the Rt shoulder due to protocol limits into abduction and ER and limtied IR and flexion due to pain/guarding.  FOTO is 70% limitation.  Pt will benefit from skilled PT for safe progression of movement of Rt UE for A/ROM, strength and endurance s/p Rt RTC repair.      History and Personal Factors relevant to plan of care:  no significant comorbidities    Clinical Presentation  Stable    Clinical Presentation due to:  expected progress s/p surgery    Clinical Decision Making  Low    Rehab Potential  Good    PT Frequency  2x / week    PT Duration  8 weeks    PT Treatment/Interventions  ADLs/Self Care Home Management;Cryotherapy;Electrical Stimulation;Moist Heat;Therapeutic activities;Therapeutic exercise;Patient/family education;Neuromuscular re-education;Manual techniques;Passive range of motion;Taping;Vasopneumatic Device    PT Next Visit Plan  Rt shoulder PROM per protocol limits, teach family member how to do  at home, sling exercises, grip strength    PT Home Exercise Plan  Access Code: [email or web address]     Consulted  and Agree with Plan of Care  Patient       Patient will benefit from skilled therapeutic intervention in order to improve the following deficits and impairments:  Pain, Decreased scar mobility, Postural dysfunction, Decreased activity tolerance, Decreased range of motion, Decreased endurance, Decreased strength, Impaired flexibility  Visit Diagnosis: Muscle weakness (generalized) - Plan: PT plan of care cert/re-cert  Acute pain of right shoulder - Plan: PT plan of care cert/re-cert  Stiffness of right shoulder, not elsewhere classified - Plan: PT plan of care cert/re-cert  Abnormal posture - Plan: PT plan of care cert/re-cert     Problem List Patient Active Problem List   Diagnosis Date Noted  . Chronic right shoulder pain 07/13/2017  . Spinal stenosis of lumbar region with neurogenic claudication 01/13/2017  . Routine general medical examination at a health care facility 05/28/2015  . Capillary hemangioma of skin 03/13/2014  . Dysplastic nevus of face 07/01/2011  . SEBORRHEIC KERATOSIS, INFLAMED 05/06/2010  . Attention deficit disorder 11/21/2009  . Hypothyroidism 12/01/2006  . Gout 12/01/2006  . Essential hypertension 12/01/2006    Sigurd Sos, PT 09/08/17 1:29 PM  [facility] [address], [place], [place], [address] Phone: [phone]   Fax:  [phone]  Name: Jose Evans MRN: [id number] Date of Birth: 27-Mar-1946

## 2017-09-08 NOTE — Patient Instructions (Signed)
Access Code: [email or web address]  URL: [email or web address]  Date: 09/08/2017  Prepared by: Sigurd Sos   Exercises  Seated Gripping Towel - 10 reps - 2 sets - 3x daily - 7x weekly  Flexion-Extension Shoulder Pendulum with Table Support - 10 reps - 1 sets - 3x daily - 7x weekly  Horizontal Shoulder Pendulum with Table Support - 10 reps - 2 sets - 3x daily - 7x weekly  Circular Shoulder Pendulum with Table Support - 10 reps - 1 sets - 3x daily - 7x weekly  Seated Shoulder Shrugs - 10 reps - 2 sets - 3x daily - 7x weekly

## 2017-09-10 ENCOUNTER — Ambulatory Visit: Payer: Medicare HMO

## 2017-09-10 ENCOUNTER — Telehealth: Payer: Self-pay | Admitting: Orthopaedic Surgery

## 2017-09-10 ENCOUNTER — Other Ambulatory Visit (INDEPENDENT_AMBULATORY_CARE_PROVIDER_SITE_OTHER): Payer: Self-pay | Admitting: Orthopaedic Surgery

## 2017-09-10 ENCOUNTER — Other Ambulatory Visit (INDEPENDENT_AMBULATORY_CARE_PROVIDER_SITE_OTHER): Payer: Self-pay | Admitting: Radiology

## 2017-09-10 DIAGNOSIS — M6281 Muscle weakness (generalized): Secondary | ICD-10-CM | POA: Diagnosis not present

## 2017-09-10 DIAGNOSIS — M25611 Stiffness of right shoulder, not elsewhere classified: Secondary | ICD-10-CM

## 2017-09-10 DIAGNOSIS — R293 Abnormal posture: Secondary | ICD-10-CM

## 2017-09-10 DIAGNOSIS — M25511 Pain in right shoulder: Secondary | ICD-10-CM | POA: Diagnosis not present

## 2017-09-10 MED ORDER — OXYCODONE-ACETAMINOPHEN 5-325 MG PO TABS
1.0000 | ORAL_TABLET | Freq: Three times a day (TID) | ORAL | 0 refills | Status: DC | PRN
Start: 2017-09-10 — End: 2017-12-30

## 2017-09-10 NOTE — Telephone Encounter (Signed)
Tramadol 50 mg #30 1-2 tabs po bid prn

## 2017-09-10 NOTE — Telephone Encounter (Signed)
Spoke w pt. He wanted his othe pain meds. Dr Durward Fortes sent the rx into the pharmacy

## 2017-09-10 NOTE — Therapy (Signed)
[facility] [address], [place], [place], [address] Phone: [phone]   Fax:  [phone]  Physical Therapy Treatment  Patient Details  Name: Jose Evans MRN: [id number] Date of Birth: 10-08-1945 Referring Provider: Joni Fears, MD   Encounter Date: 09/10/2017  PT End of Session - 09/10/17 0855    Visit Number  2    Date for PT Re-Evaluation  11/03/17    Authorization Type  Medicare    PT Start Time  0823    PT Stop Time  0904    PT Time Calculation (min)  41 min    Activity Tolerance  Patient tolerated treatment well    Behavior During Therapy  [facility] for tasks assessed/performed       Past Medical History:  Diagnosis Date  . Adult acne   . Cancer ([place]) 07/2017   skin cancer  . Gout   . Hypertension   . Thyroid disease     Past Surgical History:  Procedure Laterality Date  . HERNIA REPAIR    . SHOULDER SURGERY    . TONSILLECTOMY      There were no vitals filed for this visit.  Subjective Assessment - 09/10/17 0826    Subjective  I am sleeping better the past 2 nights.      Currently in Pain?  No/denies                       [facility] Adult PT Treatment/Exercise - 09/10/17 0001      Modalities   Modalities  Vasopneumatic      Vasopneumatic   Number Minutes Vasopneumatic   10 minutes    Vasopnuematic Location   Shoulder    Vasopneumatic Pressure  Medium    Vasopneumatic Temperature   3 snowflakes      Manual Therapy   Manual Therapy  Myofascial release;Passive ROM    Manual therapy comments  scar mobs over Rt incision    Passive ROM  PROM to Rt shoulder: ER to 30, flexion to tolerance, abduction to 70, IR to tolerance, elbow extension               PT Short Term Goals - 09/08/17 1127      PT SHORT TERM GOAL #1   Title  be independent in initial HEP    Time  4    Period  Weeks    Status  New    Target Date  10/06/17      PT SHORT TERM GOAL #2   Title   wean from sling as allowed by MD and verbalize allowed use of Rt UE with home tasks     Time  4    Period  Weeks    Status  New    Target Date  10/06/17      PT SHORT TERM GOAL #3   Title  demonstrate Rt shoulder flexion P/ROM to 140 degrees to improve mobility     Time  4    Period  Weeks    Status  New    Target Date  10/06/17      PT SHORT TERM GOAL #4   Title  reduce Rt shoulder pain to allow for full night of sleep    Time  4    Period  Weeks    Status  New    Target Date  10/06/17        PT Long Term Goals - 09/08/17  English #1   Title  be independent in advanced HEP    Time  8    Period  Weeks    Status  New    Target Date  11/03/17      PT LONG TERM GOAL #2   Title  reduce FOTO < 36% limitation     Time  8    Period  Weeks    Status  New    Target Date  11/03/17      PT LONG TERM GOAL #3   Title  demonstrate Rt shoulder A/ROM flexion to > or = to 120 degrees to allow for reaching overhead    Time  8    Period  Weeks    Status  New    Target Date  11/03/17      PT LONG TERM GOAL #4   Title  demonstate 4-/5 Rt shoulder strength to improve use and endurance    Time  8    Period  Weeks    Status  New    Target Date  11/03/17      PT LONG TERM GOAL #5   Title  demonstrate Rt shoulder A/ROM ER and IR to allow for self-care activities without limitation or substitution    Time  8    Period  Weeks    Status  New    Target Date  11/03/17      Additional Long Term Goals   Additional Long Term Goals  Yes      PT LONG TERM GOAL #6   Title  report < or = to 3/10 Rt shoulder pain with use for home and work tasks    Time  8    Period  Weeks    Status  New    Target Date  11/03/17            Plan - 09/10/17 0853    Clinical Impression Statement  Pt remains PROM only.  Pt tolerated PROM per protocol limits today.  Pt reports improved sleep since perfoming shoulder shrugs and pendulums.  Pt will continue to benefit from skilled  PT for PROM, cervical A/ROM and game ready.      Rehab Potential  Good    PT Frequency  2x / week    PT Duration  8 weeks    PT Treatment/Interventions  ADLs/Self Care Home Management;Cryotherapy;Electrical Stimulation;Moist Heat;Therapeutic activities;Therapeutic exercise;Patient/family education;Neuromuscular re-education;Manual techniques;Passive range of motion;Taping;Vasopneumatic Device    PT Next Visit Plan  Rt shoulder PROM per protocol limits, teach family member how to do at home, sling exercises, grip strength    Consulted and Agree with Plan of Care  Patient       Patient will benefit from skilled therapeutic intervention in order to improve the following deficits and impairments:  Pain, Decreased scar mobility, Postural dysfunction, Decreased activity tolerance, Decreased range of motion, Decreased endurance, Decreased strength, Impaired flexibility  Visit Diagnosis: Muscle weakness (generalized)  Acute pain of right shoulder  Stiffness of right shoulder, not elsewhere classified  Abnormal posture     Problem List Patient Active Problem List   Diagnosis Date Noted  . Chronic right shoulder pain 07/13/2017  . Spinal stenosis of lumbar region with neurogenic claudication 01/13/2017  . Routine general medical examination at a health care facility 05/28/2015  . Capillary hemangioma of skin 03/13/2014  . Dysplastic nevus of face 07/01/2011  . SEBORRHEIC KERATOSIS, INFLAMED 05/06/2010  .  Attention deficit disorder 11/21/2009  . Hypothyroidism 12/01/2006  . Gout 12/01/2006  . Essential hypertension 12/01/2006     Sigurd Sos, PT 09/10/17 8:57 AM  [facility] [address] [place], [place], [address] Phone: [phone]   Fax:  [phone]  Name: Mickle Campton MRN: [id number] Date of Birth: July 25, 1945

## 2017-09-10 NOTE — Telephone Encounter (Signed)
Patient called requesting prescription refill of pain medicine.   Patient states he doesn't need a strong prescription, just something to help with the pain during physical therapy.

## 2017-09-10 NOTE — Telephone Encounter (Signed)
Please advise 

## 2017-09-16 ENCOUNTER — Encounter: Payer: Medicare HMO | Admitting: Physical Therapy

## 2017-09-22 ENCOUNTER — Ambulatory Visit: Payer: Medicare HMO | Attending: Orthopaedic Surgery

## 2017-09-22 DIAGNOSIS — R293 Abnormal posture: Secondary | ICD-10-CM | POA: Insufficient documentation

## 2017-09-22 DIAGNOSIS — M6281 Muscle weakness (generalized): Secondary | ICD-10-CM | POA: Diagnosis not present

## 2017-09-22 DIAGNOSIS — M25511 Pain in right shoulder: Secondary | ICD-10-CM

## 2017-09-22 DIAGNOSIS — M25611 Stiffness of right shoulder, not elsewhere classified: Secondary | ICD-10-CM | POA: Diagnosis not present

## 2017-09-22 NOTE — Therapy (Signed)
[facility] [address] [place], [place], [address] Phone: [phone]   Fax:  [phone]  Physical Therapy Treatment  Patient Details  Name: Jose Evans MRN: [id number] Date of Birth: 23-Dec-1945 Referring Provider: Joni Fears, MD   Encounter Date: 09/22/2017  PT End of Session - 09/22/17 0834    Visit Number  3    Date for PT Re-Evaluation  11/03/17    Authorization Type  Medicare    PT Start Time  0803    PT Stop Time  0845    PT Time Calculation (min)  42 min    Activity Tolerance  Patient tolerated treatment well    Behavior During Therapy  [facility] for tasks assessed/performed       Past Medical History:  Diagnosis Date  . Adult acne   . Cancer ([place]) 07/2017   skin cancer  . Gout   . Hypertension   . Thyroid disease     Past Surgical History:  Procedure Laterality Date  . HERNIA REPAIR    . SHOULDER SURGERY    . TONSILLECTOMY      There were no vitals filed for this visit.  Subjective Assessment - 09/22/17 0804    Subjective  Rt shoulder feels stiff.  Not sure when I see MD again.      Patient Stated Goals  return to regular use of Rt UE, improve strength and A/ROM    Currently in Pain?  No/denies    Pain Score  5     Pain Location  Shoulder    Pain Orientation  Right    Pain Descriptors / Indicators  Aching;Sore    Pain Type  Surgical pain    Pain Onset  More than a month ago    Pain Frequency  Constant    Aggravating Factors   movement, constant    Pain Relieving Factors  pain medication, ice, rest                       [facility] Adult PT Treatment/Exercise - 09/22/17 0001      Modalities   Modalities  Vasopneumatic      Vasopneumatic   Number Minutes Vasopneumatic   15 minutes    Vasopnuematic Location   Shoulder    Vasopneumatic Pressure  Medium    Vasopneumatic Temperature   3 snowflakes      Manual Therapy   Manual Therapy  Myofascial release;Passive ROM     Manual therapy comments  scar mobs over Rt incision    Passive ROM  PROM to Rt shoulder: ER to 45, flexion to tolerance, abduction to 90,  IR to tolerance, elbow extension               PT Short Term Goals - 09/08/17 1127      PT SHORT TERM GOAL #1   Title  be independent in initial HEP    Time  4    Period  Weeks    Status  New    Target Date  10/06/17      PT SHORT TERM GOAL #2   Title  wean from sling as allowed by MD and verbalize allowed use of Rt UE with home tasks     Time  4    Period  Weeks    Status  New    Target Date  10/06/17      PT SHORT TERM GOAL #3   Title  demonstrate Rt shoulder flexion P/ROM to 140 degrees to improve mobility     Time  4    Period  Weeks    Status  New    Target Date  10/06/17      PT SHORT TERM GOAL #4   Title  reduce Rt shoulder pain to allow for full night of sleep    Time  4    Period  Weeks    Status  New    Target Date  10/06/17        PT Long Term Goals - 09/08/17 1127      PT LONG TERM GOAL #1   Title  be independent in advanced HEP    Time  8    Period  Weeks    Status  New    Target Date  11/03/17      PT LONG TERM GOAL #2   Title  reduce FOTO < 36% limitation     Time  8    Period  Weeks    Status  New    Target Date  11/03/17      PT LONG TERM GOAL #3   Title  demonstrate Rt shoulder A/ROM flexion to > or = to 120 degrees to allow for reaching overhead    Time  8    Period  Weeks    Status  New    Target Date  11/03/17      PT LONG TERM GOAL #4   Title  demonstate 4-/5 Rt shoulder strength to improve use and endurance    Time  8    Period  Weeks    Status  New    Target Date  11/03/17      PT LONG TERM GOAL #5   Title  demonstrate Rt shoulder A/ROM ER and IR to allow for self-care activities without limitation or substitution    Time  8    Period  Weeks    Status  New    Target Date  11/03/17      Additional Long Term Goals   Additional Long Term Goals  Yes      PT LONG TERM GOAL  #6   Title  report < or = to 3/10 Rt shoulder pain with use for home and work tasks    Time  8    Period  Weeks    Status  New    Target Date  11/03/17            Plan - 09/22/17 5809    Clinical Impression Statement  Pt has not followed up with the MD.  Order remains PROM only and PT will contact MD today to determine if we are allowed to advance the protocol.  Pt reports that he feels better after PROM.  Pt tolerated all PROM to tolerance and protocol limits today.  Pt will continue to benefit from skilled PT for advancement of P/ROM and protocol as allowed by MD.      Rehab Potential  Good    PT Frequency  2x / week    PT Duration  8 weeks    PT Treatment/Interventions  ADLs/Self Care Home Management;Cryotherapy;Electrical Stimulation;Moist Heat;Therapeutic activities;Therapeutic exercise;Patient/family education;Neuromuscular re-education;Manual techniques;Passive range of motion;Taping;Vasopneumatic Device    PT Next Visit Plan  Rt shoulder PROM per protocol limits, teach family member how to do at home, sling exercises, grip strength.  See if MD replies to message regarding advancement of protocol.  PT Home Exercise Plan  Access Code: [email or web address]     Recommended Other Services  Plan of care is signed    Consulted and Agree with Plan of Care  Patient       Patient will benefit from skilled therapeutic intervention in order to improve the following deficits and impairments:  Pain, Decreased scar mobility, Postural dysfunction, Decreased activity tolerance, Decreased range of motion, Decreased endurance, Decreased strength, Impaired flexibility  Visit Diagnosis: Muscle weakness (generalized)  Acute pain of right shoulder  Stiffness of right shoulder, not elsewhere classified  Abnormal posture     Problem List Patient Active Problem List   Diagnosis Date Noted  . Chronic right shoulder pain 07/13/2017  . Spinal stenosis of lumbar region with neurogenic claudication  01/13/2017  . Routine general medical examination at a health care facility 05/28/2015  . Capillary hemangioma of skin 03/13/2014  . Dysplastic nevus of face 07/01/2011  . SEBORRHEIC KERATOSIS, INFLAMED 05/06/2010  . Attention deficit disorder 11/21/2009  . Hypothyroidism 12/01/2006  . Gout 12/01/2006  . Essential hypertension 12/01/2006    Sigurd Sos, PT 09/22/17 8:35 AM  [facility] [address] [place], [place], [address] Phone: [phone]   Fax:  [phone]  Name: Ripley Bogosian MRN: [id number] Date of Birth: 12/19/1945

## 2017-09-24 ENCOUNTER — Telehealth (INDEPENDENT_AMBULATORY_CARE_PROVIDER_SITE_OTHER): Payer: Self-pay | Admitting: Orthopaedic Surgery

## 2017-09-24 NOTE — Telephone Encounter (Signed)
Patient called stating he has been attending physical therapy for his right shoulder and his therapist recommended that he contact Dr. Durward Fortes to see if he needs to schedule another appointment.

## 2017-09-24 NOTE — Telephone Encounter (Signed)
Yes 2 weeks

## 2017-09-24 NOTE — Telephone Encounter (Signed)
Dose she need to come back to see [place]

## 2017-09-24 NOTE — Telephone Encounter (Signed)
Notified pt to be seen back in 2 weeks and transferred up front to make f/u appt.

## 2017-09-29 ENCOUNTER — Ambulatory Visit: Payer: Medicare HMO

## 2017-09-29 DIAGNOSIS — M6281 Muscle weakness (generalized): Secondary | ICD-10-CM

## 2017-09-29 DIAGNOSIS — R293 Abnormal posture: Secondary | ICD-10-CM

## 2017-09-29 DIAGNOSIS — M25511 Pain in right shoulder: Secondary | ICD-10-CM | POA: Diagnosis not present

## 2017-09-29 DIAGNOSIS — M25611 Stiffness of right shoulder, not elsewhere classified: Secondary | ICD-10-CM | POA: Diagnosis not present

## 2017-09-29 NOTE — Therapy (Addendum)
[facility] [address], [place], [place], [address] Phone: [phone]   Fax:  [phone]  Physical Therapy Treatment  Patient Details  Name: Jose Evans MRN: [id number] Date of Birth: [birth year] Referring Provider: Joni Fears, MD   Encounter Date: 09/29/2017  PT End of Session - 09/29/17 1212    Visit Number  4    Date for PT Re-Evaluation  11/03/17    Authorization Type  Medicare    PT Start Time  1150    PT Stop Time  1232    PT Time Calculation (min)  42 min    Activity Tolerance  Patient tolerated treatment well    Behavior During Therapy  [employer] for tasks assessed/performed       Past Medical History:  Diagnosis Date  . Adult acne   . Cancer ([place]) 07/2017   skin cancer  . Gout   . Hypertension   . Thyroid disease     Past Surgical History:  Procedure Laterality Date  . HERNIA REPAIR    . SHOULDER SURGERY    . TONSILLECTOMY      There were no vitals filed for this visit.  Subjective Assessment - 09/29/17 1151    Subjective  I tried to schedule with Dr Durward Fortes and was on hold so long that I had to hang up.      Pertinent History  Rt RTC repair: 08/27/17. PROM only per MD.  Allowed to progress to A/ROM and AA/ROM as tolerated at 6 weeks post-op (10/08/17)    Patient Stated Goals  return to regular use of Rt UE, improve strength and A/ROM    Currently in Pain?  No/denies         [facility] PT Assessment - 09/29/17 0001      PROM   Right Shoulder Flexion  130 Degrees    Right Shoulder ABduction  100 Degrees    Right Shoulder External Rotation  55 Degrees                   [facility] Adult PT Treatment/Exercise - 09/29/17 0001      Modalities   Modalities  Vasopneumatic      Vasopneumatic   Number Minutes Vasopneumatic   15 minutes    Vasopnuematic Location   Shoulder    Vasopneumatic Pressure  Medium    Vasopneumatic Temperature   3 snowflakes      Manual Therapy   Passive  ROM  PROM to Rt shoulder: ER to 45, flexion to tolerance, abduction to 90,  IR to tolerance, elbow extension               PT Short Term Goals - 09/29/17 1155      PT SHORT TERM GOAL #1   Title  be independent in initial HEP    Time  4    Period  Weeks    Status  On-going      PT SHORT TERM GOAL #2   Title  wean from sling as allowed by MD and verbalize allowed use of Rt UE with home tasks     Baseline  still wearing, will see MD in ~2 weeks    Time  4    Period  Weeks    Status  On-going      PT SHORT TERM GOAL #3   Title  demonstrate Rt shoulder flexion P/ROM to 140 degrees to improve mobility     Baseline  130 degrees  Time  4    Period  Weeks    Status  On-going      PT SHORT TERM GOAL #4   Title  reduce Rt shoulder pain to allow for full night of sleep    Status  Achieved        PT Long Term Goals - 09/08/17 1127      PT LONG TERM GOAL #1   Title  be independent in advanced HEP    Time  8    Period  Weeks    Status  New    Target Date  11/03/17      PT LONG TERM GOAL #2   Title  reduce FOTO < 36% limitation     Time  8    Period  Weeks    Status  New    Target Date  11/03/17      PT LONG TERM GOAL #3   Title  demonstrate Rt shoulder A/ROM flexion to > or = to 120 degrees to allow for reaching overhead    Time  8    Period  Weeks    Status  New    Target Date  11/03/17      PT LONG TERM GOAL #4   Title  demonstate 4-/5 Rt shoulder strength to improve use and endurance    Time  8    Period  Weeks    Status  New    Target Date  11/03/17      PT LONG TERM GOAL #5   Title  demonstrate Rt shoulder A/ROM ER and IR to allow for self-care activities without limitation or substitution    Time  8    Period  Weeks    Status  New    Target Date  11/03/17      Additional Long Term Goals   Additional Long Term Goals  Yes      PT LONG TERM GOAL #6   Title  report < or = to 3/10 Rt shoulder pain with use for home and work tasks    Time  8     Period  Weeks    Status  New    Target Date  11/03/17            Plan - 09/29/17 1218    Clinical Impression Statement  Pt remains PROM unitl 10/08/17 when he will be able to progress to AAROM and AROM as tolerated.  Pt continues to wear sling per MD orders.  Pt is supposed to see MD in 2 weeks but is having a hard time getting ahold of the MD to schedule.  Pt with improved PROM today.  Pt will continue to benefit from skilled PT for Rt shoulder PROM unitl 10/08/17 and progression of AAROM and AROM as tolerated.      Rehab Potential  Good    PT Frequency  2x / week    PT Duration  8 weeks    PT Treatment/Interventions  ADLs/Self Care Home Management;Cryotherapy;Electrical Stimulation;Moist Heat;Therapeutic activities;Therapeutic exercise;Patient/family education;Neuromuscular re-education;Manual techniques;Passive range of motion;Taping;Vasopneumatic Device    PT Next Visit Plan  Pt allowed to progress to [facility] and AROM as tolerated on 10/07/17. Build a HEP for this.  Pt's next appt is 10/07/17-OK to begin next phase this visit as pt is only able to come 1x this week.     PT Home Exercise Plan  Access Code: [email or web address]     Consulted and Agree with Plan of Care  Patient       Patient will benefit from skilled therapeutic intervention in order to improve the following deficits and impairments:  Pain, Decreased scar mobility, Postural dysfunction, Decreased activity tolerance, Decreased range of motion, Decreased endurance, Decreased strength, Impaired flexibility  Visit Diagnosis: Muscle weakness (generalized)  Acute pain of right shoulder  Stiffness of right shoulder, not elsewhere classified  Abnormal posture     Problem List Patient Active Problem List   Diagnosis Date Noted  . Chronic right shoulder pain 07/13/2017  . Spinal stenosis of lumbar region with neurogenic claudication 01/13/2017  . Routine general medical examination at a health care facility 05/28/2015  . Capillary  hemangioma of skin 03/13/2014  . Dysplastic nevus of face 07/01/2011  . SEBORRHEIC KERATOSIS, INFLAMED 05/06/2010  . Attention deficit disorder 11/21/2009  . Hypothyroidism 12/01/2006  . Gout 12/01/2006  . Essential hypertension 12/01/2006     Sigurd Sos, PT 09/29/17 12:38 PM  [facility] [address], [place], [place], [address] Phone: [phone]   Fax:  [phone]  Name: Jose Evans MRN: [id number] Date of Birth: 09/03/1945

## 2017-10-07 ENCOUNTER — Ambulatory Visit: Payer: Medicare HMO | Admitting: Physical Therapy

## 2017-10-07 DIAGNOSIS — R293 Abnormal posture: Secondary | ICD-10-CM

## 2017-10-07 DIAGNOSIS — M25511 Pain in right shoulder: Secondary | ICD-10-CM | POA: Diagnosis not present

## 2017-10-07 DIAGNOSIS — M25611 Stiffness of right shoulder, not elsewhere classified: Secondary | ICD-10-CM

## 2017-10-07 DIAGNOSIS — M6281 Muscle weakness (generalized): Secondary | ICD-10-CM | POA: Diagnosis not present

## 2017-10-07 NOTE — Therapy (Signed)
[facility] [address], [place], [place], [address] Phone: [phone]   Fax:  [phone]  Physical Therapy Treatment  Patient Details  Name: Jose Evans MRN: [id number] Date of Birth: Feb 23, 1946 Referring Provider: Joni Fears, MD   Encounter Date: 10/07/2017  PT End of Session - 10/07/17 0928    Visit Number  5    Date for PT Re-Evaluation  11/03/17    Authorization Type  Medicare    PT Start Time  7619    PT Stop Time  0940    PT Time Calculation (min)  53 min    Activity Tolerance  Patient tolerated treatment well;No increased pain    Behavior During Therapy  WFL for tasks assessed/performed       Past Medical History:  Diagnosis Date  . Adult acne   . Cancer ([place]) 07/2017   skin cancer  . Gout   . Hypertension   . Thyroid disease     Past Surgical History:  Procedure Laterality Date  . HERNIA REPAIR    . SHOULDER SURGERY    . TONSILLECTOMY      There were no vitals filed for this visit.  Subjective Assessment - 10/07/17 0850    Subjective  Pt reports that things are going well. His shoulder is a little sore currently, but nothing bad.     Pertinent History  Rt RTC repair: 08/27/17. PROM only per MD.  Allowed to progress to A/ROM and AA/ROM as tolerated at 6 weeks post-op (10/08/17)    Patient Stated Goals  return to regular use of Rt UE, improve strength and A/ROM    Currently in Pain?  No/denies                       [facility] Adult PT Treatment/Exercise - 10/07/17 0001      Exercises   Exercises  Shoulder      Shoulder Exercises: Supine   External Rotation  AAROM;Right;10 reps    External Rotation Limitations  towel under elbow    Flexion  AAROM;Both;10 reps    Flexion Limitations  elbow bent during initial flexion to decrease pain      Shoulder Exercises: Seated   Flexion  Right;10 reps;AAROM    Flexion Limitations  table slide     Other Seated Exercises  Rt  shoulder scaption table slide x10 reps       Shoulder Exercises: Pulleys   Flexion  3 minutes    Scaption  3 minutes    ABduction  3 minutes      Vasopneumatic   Number Minutes Vasopneumatic   15 minutes    Vasopnuematic Location   Shoulder    Vasopneumatic Pressure  Medium    Vasopneumatic Temperature   3 snowflakes             PT Education - 10/07/17 0927    Education provided  Yes    Education Details  HEP updated; discussed sleeping positions to decrease pain and improve blood flow to the area    Person(s) Educated  Patient    Methods  Explanation;Handout;Demonstration;Verbal cues    Comprehension  Verbalized understanding;Returned demonstration       PT Short Term Goals - 09/29/17 1155      PT SHORT TERM GOAL #1   Title  be independent in initial HEP    Time  4    Period  Weeks    Status  On-going  PT SHORT TERM GOAL #2   Title  wean from sling as allowed by MD and verbalize allowed use of Rt UE with home tasks     Baseline  still wearing, will see MD in ~2 weeks    Time  4    Period  Weeks    Status  On-going      PT SHORT TERM GOAL #3   Title  demonstrate Rt shoulder flexion P/ROM to 140 degrees to improve mobility     Baseline  130 degrees    Time  4    Period  Weeks    Status  On-going      PT SHORT TERM GOAL #4   Title  reduce Rt shoulder pain to allow for full night of sleep    Status  Achieved        PT Long Term Goals - 09/08/17 1127      PT LONG TERM GOAL #1   Title  be independent in advanced HEP    Time  8    Period  Weeks    Status  New    Target Date  11/03/17      PT LONG TERM GOAL #2   Title  reduce FOTO < 36% limitation     Time  8    Period  Weeks    Status  New    Target Date  11/03/17      PT LONG TERM GOAL #3   Title  demonstrate Rt shoulder A/ROM flexion to > or = to 120 degrees to allow for reaching overhead    Time  8    Period  Weeks    Status  New    Target Date  11/03/17      PT LONG TERM GOAL #4    Title  demonstate 4-/5 Rt shoulder strength to improve use and endurance    Time  8    Period  Weeks    Status  New    Target Date  11/03/17      PT LONG TERM GOAL #5   Title  demonstrate Rt shoulder A/ROM ER and IR to allow for self-care activities without limitation or substitution    Time  8    Period  Weeks    Status  New    Target Date  11/03/17      Additional Long Term Goals   Additional Long Term Goals  Yes      PT LONG TERM GOAL #6   Title  report < or = to 3/10 Rt shoulder pain with use for home and work tasks    Time  8    Period  Weeks    Status  New    Target Date  11/03/17            Plan - 10/07/17 0928    Clinical Impression Statement  Pt continues to complete HEP at home as instructed. Introduced AAROM exercises for him to complete at home and reviewed this today to ensure full understanding. Pt did note discomfort with end range stretch, but was able to complete all exercises with minor adjustments to decrease pain. Therapist discussed sleeping positioning for the shoulder and he verbalized understanding of this. Ended with minor soreness, as expected following today's progression of exercises.    Rehab Potential  Good    PT Frequency  2x / week    PT Duration  8 weeks    PT Treatment/Interventions  ADLs/Self  Care Home Management;Cryotherapy;Electrical Stimulation;Moist Heat;Therapeutic activities;Therapeutic exercise;Patient/family education;Neuromuscular re-education;Manual techniques;Passive range of motion;Taping;Vasopneumatic Device    PT Next Visit Plan  Pt allowed to progress to [facility] and AROM as tolerated on 10/07/17. f/u on HEP adherence, shoulder PROM stretching as needed to ensure full passive range; scapula strengthening and mobility work, consider increasing PT frequency    PT Home Exercise Plan  Access Code: [email or web address]     Consulted and Agree with Plan of Care  Patient       Patient will benefit from skilled therapeutic intervention in order to  improve the following deficits and impairments:  Pain, Decreased scar mobility, Postural dysfunction, Decreased activity tolerance, Decreased range of motion, Decreased endurance, Decreased strength, Impaired flexibility  Visit Diagnosis: Muscle weakness (generalized)  Acute pain of right shoulder  Stiffness of right shoulder, not elsewhere classified  Abnormal posture     Problem List Patient Active Problem List   Diagnosis Date Noted  . Chronic right shoulder pain 07/13/2017  . Spinal stenosis of lumbar region with neurogenic claudication 01/13/2017  . Routine general medical examination at a health care facility 05/28/2015  . Capillary hemangioma of skin 03/13/2014  . Dysplastic nevus of face 07/01/2011  . SEBORRHEIC KERATOSIS, INFLAMED 05/06/2010  . Attention deficit disorder 11/21/2009  . Hypothyroidism 12/01/2006  . Gout 12/01/2006  . Essential hypertension 12/01/2006    9:34 AM,10/07/17 Sherol Dade PT, DPT [facility]  [facility] [address], [place], [place], [address] Phone: [phone]   Fax:  [phone]  Name: Murle Otting MRN: [id number] Date of Birth: [birth year]

## 2017-10-13 ENCOUNTER — Ambulatory Visit: Payer: Medicare HMO

## 2017-10-13 DIAGNOSIS — M6281 Muscle weakness (generalized): Secondary | ICD-10-CM

## 2017-10-13 DIAGNOSIS — M25511 Pain in right shoulder: Secondary | ICD-10-CM

## 2017-10-13 DIAGNOSIS — M25611 Stiffness of right shoulder, not elsewhere classified: Secondary | ICD-10-CM | POA: Diagnosis not present

## 2017-10-13 DIAGNOSIS — R293 Abnormal posture: Secondary | ICD-10-CM

## 2017-10-13 NOTE — Therapy (Signed)
[facility] [address], [place], [place], [address] Phone: [phone]   Fax:  [phone]  Physical Therapy Treatment  Patient Details  Name: Jose Evans MRN: [id number] Date of Birth: 11/09/1945 Referring Provider: Joni Fears, MD   Encounter Date: 10/13/2017  PT End of Session - 10/13/17 0841    Visit Number  6    Date for PT Re-Evaluation  11/24/17    Authorization Type  Medicare    PT Start Time  0800    PT Stop Time  0840    PT Time Calculation (min)  40 min    Activity Tolerance  Patient tolerated treatment well;No increased pain    Behavior During Therapy  WFL for tasks assessed/performed       Past Medical History:  Diagnosis Date  . Adult acne   . Cancer ([place]) 07/2017   skin cancer  . Gout   . Hypertension   . Thyroid disease     Past Surgical History:  Procedure Laterality Date  . HERNIA REPAIR    . SHOULDER SURGERY    . TONSILLECTOMY      There were no vitals filed for this visit.  Subjective Assessment - 10/13/17 0815    Subjective  I'm doing well with my exercises    Pertinent History  Rt RTC repair: 08/27/17. PROM only per MD.  Allowed to progress to A/ROM and AA/ROM as tolerated at 6 weeks post-op (10/08/17)    Currently in Pain?  No/denies         [facility] PT Assessment - 10/13/17 0001      Assessment   Medical Diagnosis  chronic Rt shoulder pain- s/p Rt RTC repair    Onset Date/Surgical Date  08/27/17      Precautions   Precautions  Shoulder    Type of Shoulder Precautions  A/AROM per MD      Prior Function   Level of Independence  Independent    Vocation  Full time employment    Programme researcher, broadcasting/film/video, lifting, computer work    Leisure  hiking      Cognition   Overall Cognitive Status  Within Functional Limits for tasks assessed      Posture/Postural Control   Posture/Postural Control  Postural limitations    Postural Limitations  Forward head;Rounded  Shoulders      ROM / Strength   AROM / PROM / Strength  AROM;PROM;Strength      AROM   Overall AROM   Due to precautions      PROM   Overall PROM   Deficits    Overall PROM Comments  A/AROM measured    Right/Left Shoulder  Right    Right Shoulder Flexion  135 Degrees with pulleys    Right Shoulder ABduction  125 Degrees with pulleys    Right Shoulder Internal Rotation  65 Degrees    Right Shoulder External Rotation  50 Degrees      Strength   Overall Strength  Deficits;Due to precautions    Strength Assessment Site  Hand    Right/Left hand  Right;Left    Right Hand Grip (lbs)  80#                   [facility] Adult PT Treatment/Exercise - 10/13/17 0001      Shoulder Exercises: Seated   Other Seated Exercises  UE ranger: flexion and horizontal abduction (arc)x 20 each      Shoulder Exercises: Standing  Flexion  AAROM;Right;20 reps using finger ladder    Other Standing Exercises  UE Ranger: flexion x 20      Shoulder Exercises: Pulleys   Flexion  3 minutes    Scaption  3 minutes    ABduction  3 minutes               PT Short Term Goals - 10/13/17 0843      PT SHORT TERM GOAL #1   Title  be independent in initial HEP    Status  Achieved      PT SHORT TERM GOAL #2   Title  wean from sling as allowed by MD and verbalize allowed use of Rt UE with home tasks     Status  Achieved      PT SHORT TERM GOAL #3   Title  demonstrate Rt shoulder flexion P/ROM to 140 degrees to improve mobility     Baseline  135    Period  Weeks    Status  On-going      PT SHORT TERM GOAL #4   Title  reduce Rt shoulder pain to allow for full night of sleep    Status  Achieved        PT Long Term Goals - 10/13/17 0841      PT LONG TERM GOAL #1   Title  be independent in advanced HEP    Time  6    Period  Weeks    Status  On-going    Target Date  11/24/17      PT LONG TERM GOAL #2   Title  reduce FOTO < 36% limitation     Time  6    Period  Weeks    Status   On-going    Target Date  11/24/17      PT LONG TERM GOAL #3   Title  demonstrate Rt shoulder A/ROM flexion to > or = to 120 degrees to allow for reaching overhead    Baseline  A/AROM 135, A/ROM not tested due to precautions    Time  6    Period  Weeks    Status  On-going    Target Date  11/24/17      PT LONG TERM GOAL #4   Title  demonstate 4-/5 Rt shoulder strength to improve use and endurance    Baseline  not tested    Time  6    Period  Weeks    Status  On-going    Target Date  11/24/17      PT LONG TERM GOAL #5   Title  demonstrate Rt shoulder A/ROM ER and IR to allow for self-care activities without limitation or substitution    Baseline  not tested due to post-op    Time  6    Period  Weeks    Status  On-going    Target Date  11/24/17      PT LONG TERM GOAL #6   Title  report < or = to 3/10 Rt shoulder pain with use for home and work tasks    Status  Achieved            Plan - 10/13/17 0816    Clinical Impression Statement  Pt is independent and compliant with HEP for A/AROM of Rt shoulder.  Pt requires tactile and verbal cues for scapular depression with A/AROM against gravity.  Pt is making steady progress per MD protocol with improved A/ROM and A/AROM and plans  to see MD on 10/19/17.  Pt will continue to benefit from skilled PT for safe progression of post-op protocol to improve strength and A/ROM.    Rehab Potential  Good    PT Frequency  2x / week    PT Duration  6 weeks    PT Treatment/Interventions  ADLs/Self Care Home Management;Cryotherapy;Electrical Stimulation;Moist Heat;Therapeutic activities;Therapeutic exercise;Patient/family education;Neuromuscular re-education;Manual techniques;Passive range of motion;Taping;Vasopneumatic Device    PT Next Visit Plan  Pt allowed to progress to [facility] and AROM as tolerated on 10/07/17. f/u on HEP adherence, shoulder PROM stretching as needed to ensure full passive range; scapula strengthening and mobility work, consider  increasing PT frequency.  See what MD says.    PT Home Exercise Plan  Access Code: [email or web address]     Recommended Other Services  recert sent 2/35/36    Consulted and Agree with Plan of Care  Patient       Patient will benefit from skilled therapeutic intervention in order to improve the following deficits and impairments:  Pain, Decreased scar mobility, Postural dysfunction, Decreased activity tolerance, Decreased range of motion, Decreased endurance, Decreased strength, Impaired flexibility  Visit Diagnosis: Muscle weakness (generalized) - Plan: PT plan of care cert/re-cert  Acute pain of right shoulder - Plan: PT plan of care cert/re-cert  Stiffness of right shoulder, not elsewhere classified - Plan: PT plan of care cert/re-cert  Abnormal posture - Plan: PT plan of care cert/re-cert     Problem List Patient Active Problem List   Diagnosis Date Noted  . Chronic right shoulder pain 07/13/2017  . Spinal stenosis of lumbar region with neurogenic claudication 01/13/2017  . Routine general medical examination at a health care facility 05/28/2015  . Capillary hemangioma of skin 03/13/2014  . Dysplastic nevus of face 07/01/2011  . SEBORRHEIC KERATOSIS, INFLAMED 05/06/2010  . Attention deficit disorder 11/21/2009  . Hypothyroidism 12/01/2006  . Gout 12/01/2006  . Essential hypertension 12/01/2006    Sigurd Sos, PT 10/13/17 8:48 AM  [facility] [address] [place], [place], [address] Phone: [phone]   Fax:  [phone]  Name: Ledarrius Beauchaine MRN: [id number] Date of Birth: [birth year]

## 2017-10-15 ENCOUNTER — Other Ambulatory Visit: Payer: Self-pay | Admitting: Family Medicine

## 2017-10-19 ENCOUNTER — Encounter (INDEPENDENT_AMBULATORY_CARE_PROVIDER_SITE_OTHER): Payer: Self-pay | Admitting: Orthopaedic Surgery

## 2017-10-19 ENCOUNTER — Ambulatory Visit (INDEPENDENT_AMBULATORY_CARE_PROVIDER_SITE_OTHER): Payer: Medicare HMO | Admitting: Orthopaedic Surgery

## 2017-10-19 VITALS — BP 139/79 | HR 54 | Ht 68.0 in | Wt 185.0 lb

## 2017-10-19 DIAGNOSIS — G8929 Other chronic pain: Secondary | ICD-10-CM

## 2017-10-19 DIAGNOSIS — M25511 Pain in right shoulder: Secondary | ICD-10-CM

## 2017-10-19 NOTE — Progress Notes (Signed)
Office Visit Note   Patient: Jose Evans           Date of Birth: Feb 10, 1946           MRN: [id number] Visit Date: 10/19/2017              Requested by: Dorena Cookey, MD [place], [place] [address] PCP: Dorena Cookey, MD   Assessment & Plan: Visit Diagnoses:  1. Chronic right shoulder pain     Plan: 2 months status post rotator cuff tear repair right shoulder and actually doing well.  Continues to be followed in physical therapy.  No longer using a sling.  We will see in 1 month and continue with exercises  Follow-Up Instructions: Return in about 1 month (around 11/19/2017).   Orders:  No orders of the defined types were placed in this encounter.  No orders of the defined types were placed in this encounter.     Procedures: No procedures performed   Clinical Data: No additional findings.   Subjective: Chief Complaint  Patient presents with  . Follow-up    R SHOULDER SURGERY 08/27/17 BICEP AND ROTATOR CUFF REPAIR WOULD LIKE TO BERELEASE  2 months status post rotator cuff tear repair right shoulder.  Doing well with physical therapy.  Can already tell he is having less pain than he was preoperatively.  HPI  Review of Systems  Constitutional: Negative for fatigue and fever.  HENT: Negative for ear pain.   Eyes: Negative for pain.  Respiratory: Negative for cough and shortness of breath.   Cardiovascular: Negative for leg swelling.  Gastrointestinal: Negative for constipation and diarrhea.  Genitourinary: Negative for difficulty urinating.  Musculoskeletal: Positive for neck pain. Negative for back pain.  Skin: Negative for rash.  Allergic/Immunologic: Negative for food allergies.  Neurological: Negative for weakness and numbness.  Hematological: Bruises/bleeds easily.  Psychiatric/Behavioral: Positive for sleep disturbance.     Objective: Vital Signs: BP 139/79 (BP Location: Left Arm, Patient Position: Sitting, Cuff Size: Normal)    Pulse (!) 54   Ht 5\' 8"  (1.727 m)   Wt 185 lb (83.9 kg)   BMI 28.13 kg/m   Physical Exam  Ortho Exam awake alert and oriented x3.  Comfortable sitting.  Passively able to place right arm almost fully overhead.  Abducts at least 90 degrees.  Neurovascular exam intact.  Negative impingement.  Good grip and good release.  Specialty Comments:  No specialty comments available.  Imaging: No results found.   PMFS History: Patient Active Problem List   Diagnosis Date Noted  . Chronic right shoulder pain 07/13/2017  . Spinal stenosis of lumbar region with neurogenic claudication 01/13/2017  . Routine general medical examination at a health care facility 05/28/2015  . Capillary hemangioma of skin 03/13/2014  . Dysplastic nevus of face 07/01/2011  . SEBORRHEIC KERATOSIS, INFLAMED 05/06/2010  . Attention deficit disorder 11/21/2009  . Hypothyroidism 12/01/2006  . Gout 12/01/2006  . Essential hypertension 12/01/2006   Past Medical History:  Diagnosis Date  . Adult acne   . Cancer ([place]) 07/2017   skin cancer  . Gout   . Hypertension   . Thyroid disease     Family History  Problem Relation Age of Onset  . Hypertension Other     Past Surgical History:  Procedure Laterality Date  . HERNIA REPAIR    . SHOULDER SURGERY    . TONSILLECTOMY     Social History   Occupational History  . Not  on file  Tobacco Use  . Smoking status: Former Smoker    Packs/day: 0.50    Years: 30.00    Pack years: 15.00    Types: Cigarettes    Last attempt to quit: 09/07/2005    Years since quitting: 12.1  . Smokeless tobacco: Never Used  Substance and Sexual Activity  . Alcohol use: Yes    Comment: FEW SHOTS A WEEK  . Drug use: No  . Sexual activity: Not on file

## 2017-10-20 ENCOUNTER — Ambulatory Visit: Payer: Medicare HMO | Attending: Orthopaedic Surgery

## 2017-10-20 DIAGNOSIS — M25511 Pain in right shoulder: Secondary | ICD-10-CM | POA: Diagnosis not present

## 2017-10-20 DIAGNOSIS — R293 Abnormal posture: Secondary | ICD-10-CM

## 2017-10-20 DIAGNOSIS — M6281 Muscle weakness (generalized): Secondary | ICD-10-CM

## 2017-10-20 DIAGNOSIS — M25611 Stiffness of right shoulder, not elsewhere classified: Secondary | ICD-10-CM | POA: Diagnosis not present

## 2017-10-20 NOTE — Therapy (Signed)
[facility] [address], [place], [place], [address] Phone: [phone]   Fax:  [phone]  Physical Therapy Treatment  Patient Details  Name: Jose Evans MRN: [id number] Date of Birth: 02/12/1946 Referring Provider: Joni Fears, MD   Encounter Date: 10/20/2017  PT End of Session - 10/20/17 0929    Visit Number  7    Date for PT Re-Evaluation  11/24/17    Authorization Type  Medicare    PT Start Time  7681    PT Stop Time  0928    PT Time Calculation (min)  46 min    Activity Tolerance  Patient tolerated treatment well;No increased pain    Behavior During Therapy  WFL for tasks assessed/performed       Past Medical History:  Diagnosis Date  . Adult acne   . Cancer ([place]) 07/2017   skin cancer  . Gout   . Hypertension   . Thyroid disease     Past Surgical History:  Procedure Laterality Date  . HERNIA REPAIR    . SHOULDER SURGERY    . TONSILLECTOMY      There were no vitals filed for this visit.  Subjective Assessment - 10/20/17 0845    Subjective  I saw MD yesterday and he was happy with progress.  OK for A/ROM and AA/ROM of Rt shoulder    Pertinent History  Rt RTC repair: 08/27/17. PROM only per MD.  Allowed to progress to A/ROM and AA/ROM as tolerated at 6 weeks post-op (10/08/17)    Patient Stated Goals  return to regular use of Rt UE, improve strength and A/ROM    Currently in Pain?  No/denies                       [facility] Adult PT Treatment/Exercise - 10/20/17 0001      Shoulder Exercises: Seated   Flexion  Strengthening;AROM;Right;20 reps    Diagonals  Strengthening;AAROM;Right;20 reps scaption    Other Seated Exercises  UE ranger: flexion and horizontal abduction (arc)x 20 each      Shoulder Exercises: Standing   Flexion  AAROM;Right;20 reps using finger ladder    Other Standing Exercises  --      Shoulder Exercises: Pulleys   Flexion  3 minutes    Scaption  3 minutes       Manual Therapy   Passive ROM  PROM: Rt shoulder flexion, IR and ER with inferior glides               PT Short Term Goals - 10/13/17 0843      PT SHORT TERM GOAL #1   Title  be independent in initial HEP    Status  Achieved      PT SHORT TERM GOAL #2   Title  wean from sling as allowed by MD and verbalize allowed use of Rt UE with home tasks     Status  Achieved      PT SHORT TERM GOAL #3   Title  demonstrate Rt shoulder flexion P/ROM to 140 degrees to improve mobility     Baseline  135    Period  Weeks    Status  On-going      PT SHORT TERM GOAL #4   Title  reduce Rt shoulder pain to allow for full night of sleep    Status  Achieved        PT Long Term Goals - 10/13/17 1572  PT LONG TERM GOAL #1   Title  be independent in advanced HEP    Time  6    Period  Weeks    Status  On-going    Target Date  11/24/17      PT LONG TERM GOAL #2   Title  reduce FOTO < 36% limitation     Time  6    Period  Weeks    Status  On-going    Target Date  11/24/17      PT LONG TERM GOAL #3   Title  demonstrate Rt shoulder A/ROM flexion to > or = to 120 degrees to allow for reaching overhead    Baseline  A/AROM 135, A/ROM not tested due to precautions    Time  6    Period  Weeks    Status  On-going    Target Date  11/24/17      PT LONG TERM GOAL #4   Title  demonstate 4-/5 Rt shoulder strength to improve use and endurance    Baseline  not tested    Time  6    Period  Weeks    Status  On-going    Target Date  11/24/17      PT LONG TERM GOAL #5   Title  demonstrate Rt shoulder A/ROM ER and IR to allow for self-care activities without limitation or substitution    Baseline  not tested due to post-op    Time  6    Period  Weeks    Status  On-going    Target Date  11/24/17      PT LONG TERM GOAL #6   Title  report < or = to 3/10 Rt shoulder pain with use for home and work tasks    Status  Achieved            Plan - 10/20/17 0846    Clinical  Impression Statement  Pt is independent and compliant with HEP for A/AROM of Rt shoulder.  Pt has weaned from the sling 100%.  Pt requires tactile and verbal cues for scapular depression.  Pt with Rt rotator cuff weakness and endurance deficits s/p sugery with immobilization.  Pt will continue to benefit from skilled PT for Rt UE A/ROM and AA/ROM as tolerated.      Rehab Potential  Good    PT Frequency  2x / week    PT Duration  6 weeks    PT Treatment/Interventions  ADLs/Self Care Home Management;Cryotherapy;Electrical Stimulation;Moist Heat;Therapeutic activities;Therapeutic exercise;Patient/family education;Neuromuscular re-education;Manual techniques;Passive range of motion;Taping;Vasopneumatic Device    PT Next Visit Plan  Rt shoulder A/ROM and AA/ROM as tolerated.  Stretching to ensure full passive range.      PT Home Exercise Plan  Access Code: [email or web address]     Recommended Other Services  cert and recert signed    Consulted and Agree with Plan of Care  Patient       Patient will benefit from skilled therapeutic intervention in order to improve the following deficits and impairments:  Pain, Decreased scar mobility, Postural dysfunction, Decreased activity tolerance, Decreased range of motion, Decreased endurance, Decreased strength, Impaired flexibility  Visit Diagnosis: Muscle weakness (generalized)  Acute pain of right shoulder  Stiffness of right shoulder, not elsewhere classified  Abnormal posture     Problem List Patient Active Problem List   Diagnosis Date Noted  . Chronic right shoulder pain 07/13/2017  . Spinal stenosis of lumbar region with neurogenic claudication 01/13/2017  .  Routine general medical examination at a health care facility 05/28/2015  . Capillary hemangioma of skin 03/13/2014  . Dysplastic nevus of face 07/01/2011  . SEBORRHEIC KERATOSIS, INFLAMED 05/06/2010  . Attention deficit disorder 11/21/2009  . Hypothyroidism 12/01/2006  . Gout 12/01/2006  .  Essential hypertension 12/01/2006     Sigurd Sos, PT 10/20/17 9:32 AM  [facility] [address], [place], [place], [address] Phone: [phone]   Fax:  [phone]  Name: Jose Evans MRN: [id number] Date of Birth: 1945/10/09

## 2017-10-28 ENCOUNTER — Ambulatory Visit: Payer: Medicare HMO

## 2017-10-28 DIAGNOSIS — M6281 Muscle weakness (generalized): Secondary | ICD-10-CM

## 2017-10-28 DIAGNOSIS — M25611 Stiffness of right shoulder, not elsewhere classified: Secondary | ICD-10-CM | POA: Diagnosis not present

## 2017-10-28 DIAGNOSIS — M25511 Pain in right shoulder: Secondary | ICD-10-CM | POA: Diagnosis not present

## 2017-10-28 DIAGNOSIS — R293 Abnormal posture: Secondary | ICD-10-CM

## 2017-10-28 NOTE — Therapy (Signed)
[facility] [address], [place], [place], [address] Phone: [phone]   Fax:  [phone]  Physical Therapy Treatment  Patient Details  Name: Jose Evans MRN: [id number] Date of Birth: [birth year] Referring Provider: Joni Fears, MD   Encounter Date: 10/28/2017  PT End of Session - 10/28/17 1131    Visit Number  8    Date for PT Re-Evaluation  11/24/17    Authorization Type  Medicare    PT Start Time  1058    PT Stop Time  1138    PT Time Calculation (min)  40 min    Behavior During Therapy  [facility] for tasks assessed/performed       Past Medical History:  Diagnosis Date  . Adult acne   . Cancer ([place]) 07/2017   skin cancer  . Gout   . Hypertension   . Thyroid disease     Past Surgical History:  Procedure Laterality Date  . HERNIA REPAIR    . SHOULDER SURGERY    . TONSILLECTOMY      There were no vitals filed for this visit.  Subjective Assessment - 10/28/17 1056    Subjective  I am doing well.      Patient Stated Goals  return to regular use of Rt UE, improve strength and A/ROM    Currently in Pain?  No/denies                       [facility] Adult PT Treatment/Exercise - 10/28/17 0001      Shoulder Exercises: Seated   Flexion  Strengthening;AROM;Right;20 reps    Abduction  Strengthening;Right;20 reps    Diagonals  Strengthening;AAROM;Right;20 reps scaption    Other Seated Exercises  UE ranger: flexion and horizontal abduction (arc)x 20 each      Shoulder Exercises: Standing   Flexion  AAROM;Right;20 reps using finger ladder    Other Standing Exercises  UE Ranger: flexion x 20, scaption x 20      Shoulder Exercises: Pulleys   Flexion  3 minutes    ABduction  3 minutes tactile cues on scapula      Shoulder Exercises: ROM/Strengthening   UBE (Upper Arm Bike)  Level 0 (no resistance) x 6 minutes (3/3)               PT Short Term Goals - 10/28/17 1059      PT  SHORT TERM GOAL #3   Title  demonstrate Rt shoulder flexion P/ROM to 140 degrees to improve mobility       PT SHORT TERM GOAL #4   Title  reduce Rt shoulder pain to allow for full night of sleep    Status  Achieved        PT Long Term Goals - 10/13/17 0841      PT LONG TERM GOAL #1   Title  be independent in advanced HEP    Time  6    Period  Weeks    Status  On-going    Target Date  11/24/17      PT LONG TERM GOAL #2   Title  reduce FOTO < 36% limitation     Time  6    Period  Weeks    Status  On-going    Target Date  11/24/17      PT LONG TERM GOAL #3   Title  demonstrate Rt shoulder A/ROM flexion to > or = to 120 degrees to  allow for reaching overhead    Baseline  A/AROM 135, A/ROM not tested due to precautions    Time  6    Period  Weeks    Status  On-going    Target Date  11/24/17      PT LONG TERM GOAL #4   Title  demonstate 4-/5 Rt shoulder strength to improve use and endurance    Baseline  not tested    Time  6    Period  Weeks    Status  On-going    Target Date  11/24/17      PT LONG TERM GOAL #5   Title  demonstrate Rt shoulder A/ROM ER and IR to allow for self-care activities without limitation or substitution    Baseline  not tested due to post-op    Time  6    Period  Weeks    Status  On-going    Target Date  11/24/17      PT LONG TERM GOAL #6   Title  report < or = to 3/10 Rt shoulder pain with use for home and work tasks    Status  Achieved            Plan - 10/28/17 1104    Clinical Impression Statement  Pt with improved A/ROM today with reduced scapular elevation.  Pt requires fewer tactile cues.  Pt with rotator cuff weakness and endurance deficits s/p surgery with immobilization.  Pt with good active motion of Rt shoulder against gravity. Pt will continue to benefit from skilled PT for safe progression of Rt shoulder strength and flexibility s/p RTC repair.      Rehab Potential  Good    PT Frequency  2x / week    PT Duration  6 weeks     PT Treatment/Interventions  ADLs/Self Care Home Management;Cryotherapy;Electrical Stimulation;Moist Heat;Therapeutic activities;Therapeutic exercise;Patient/family education;Neuromuscular re-education;Manual techniques;Passive range of motion;Taping;Vasopneumatic Device    PT Next Visit Plan  Rt shoulder A/ROM and AA/ROM as tolerated.  Stretching to ensure full passive range.      PT Home Exercise Plan  Access Code: [email or web address]     Consulted and Agree with Plan of Care  Patient       Patient will benefit from skilled therapeutic intervention in order to improve the following deficits and impairments:  Pain, Decreased scar mobility, Postural dysfunction, Decreased activity tolerance, Decreased range of motion, Decreased endurance, Decreased strength, Impaired flexibility  Visit Diagnosis: Muscle weakness (generalized)  Acute pain of right shoulder  Stiffness of right shoulder, not elsewhere classified  Abnormal posture     Problem List Patient Active Problem List   Diagnosis Date Noted  . Chronic right shoulder pain 07/13/2017  . Spinal stenosis of lumbar region with neurogenic claudication 01/13/2017  . Routine general medical examination at a health care facility 05/28/2015  . Capillary hemangioma of skin 03/13/2014  . Dysplastic nevus of face 07/01/2011  . SEBORRHEIC KERATOSIS, INFLAMED 05/06/2010  . Attention deficit disorder 11/21/2009  . Hypothyroidism 12/01/2006  . Gout 12/01/2006  . Essential hypertension 12/01/2006     Sigurd Sos, PT 10/28/17 11:34 AM  [facility] [address], [place], [place], [address] Phone: [phone]   Fax:  [phone]  Name: Jose Evans MRN: [id number] Date of Birth: [birth year]

## 2017-11-04 ENCOUNTER — Encounter: Payer: Self-pay | Admitting: Physical Therapy

## 2017-11-04 ENCOUNTER — Ambulatory Visit: Payer: Medicare HMO | Admitting: Physical Therapy

## 2017-11-04 DIAGNOSIS — M25611 Stiffness of right shoulder, not elsewhere classified: Secondary | ICD-10-CM

## 2017-11-04 DIAGNOSIS — R293 Abnormal posture: Secondary | ICD-10-CM

## 2017-11-04 DIAGNOSIS — M6281 Muscle weakness (generalized): Secondary | ICD-10-CM | POA: Diagnosis not present

## 2017-11-04 DIAGNOSIS — M25511 Pain in right shoulder: Secondary | ICD-10-CM | POA: Diagnosis not present

## 2017-11-04 NOTE — Therapy (Addendum)
[facility] [address], [place], [place], [address] Phone: [phone]   Fax:  [phone]  Physical Therapy Treatment  Patient Details  Name: Jose Evans MRN: [id number] Date of Birth: 24-Mar-1946 Referring Provider: Joni Fears, MD   Encounter Date: 11/04/2017  PT End of Session - 11/04/17 0844    Visit Number  9    Date for PT Re-Evaluation  11/24/17    Authorization Type  Medicare    PT Start Time  0800    PT Stop Time  0340    PT Time Calculation (min)  42 min    Activity Tolerance  No increased pain;Patient tolerated treatment well    Behavior During Therapy  [facility] for tasks assessed/performed       Past Medical History:  Diagnosis Date  . Adult acne   . Cancer ([place]) 07/2017   skin cancer  . Gout   . Hypertension   . Thyroid disease     Past Surgical History:  Procedure Laterality Date  . HERNIA REPAIR    . SHOULDER SURGERY    . TONSILLECTOMY      There were no vitals filed for this visit.  Subjective Assessment - 11/04/17 0802    Subjective  Pt states that he is having more of a rough time with his shoulder over the past week or so. He didn't do anything in particular.     Patient Stated Goals  return to regular use of Rt UE, improve strength and A/ROM    Currently in Pain?  Yes    Pain Score  6     Pain Location  Shoulder    Pain Orientation  Right    Pain Descriptors / Indicators  Aching;Sore    Pain Type  Acute pain    Pain Radiating Towards  none     Pain Onset  In the past 7 days    Pain Frequency  Constant    Aggravating Factors   constant    Pain Relieving Factors  pain medication, ice, rest    Effect of Pain on Daily Activities  issues sleeping                       [facility] Adult PT Treatment/Exercise - 11/04/17 0001      Shoulder Exercises: Seated   Flexion  Right;10 reps;Limitations    Flexion Limitations  5 sec hold for end range stretch       Shoulder Exercises: Sidelying   ABduction  20 reps;Right;AROM    Other Sidelying Exercises  Rt horizontal abduction x20 reps       Shoulder Exercises: Standing   Flexion  Right;10 reps;Limitations    Flexion Limitations  5 sec hold for end range stretch      Shoulder Exercises: ROM/Strengthening   UBE (Upper Arm Bike)  L0, x2 min forward/backward, PT cuing for posture    Other ROM/Strengthening Exercises  BUE rows with green TB x20 reps       Shoulder Exercises: Stretch   Other Shoulder Stretches  B lat stretch seated with UE on foam roll x10 reps       Manual Therapy   Manual Therapy  Soft tissue mobilization;Myofascial release    Soft tissue mobilization  STM Rt upper trap, Rt posterior deltoid and teres/infraspinatus    Myofascial Release  TPR Rt latissimus             PT Education - 11/04/17 3524  Education provided  Yes    Education Details  HEP updates; importance of decreasing shoulder shrug throughout the day and monitoring during activity    Person(s) Educated  Patient    Methods  Explanation;Handout;Verbal cues    Comprehension  Verbalized understanding;Returned demonstration       PT Short Term Goals - 10/28/17 1059      PT SHORT TERM GOAL #3   Title  demonstrate Rt shoulder flexion P/ROM to 140 degrees to improve mobility       PT SHORT TERM GOAL #4   Title  reduce Rt shoulder pain to allow for full night of sleep    Status  Achieved        PT Long Term Goals - 10/13/17 0841      PT LONG TERM GOAL #1   Title  be independent in advanced HEP    Time  6    Period  Weeks    Status  On-going    Target Date  11/24/17      PT LONG TERM GOAL #2   Title  reduce FOTO < 36% limitation     Time  6    Period  Weeks    Status  On-going    Target Date  11/24/17      PT LONG TERM GOAL #3   Title  demonstrate Rt shoulder A/ROM flexion to > or = to 120 degrees to allow for reaching overhead    Baseline  A/AROM 135, A/ROM not tested due to precautions     Time  6    Period  Weeks    Status  On-going    Target Date  11/24/17      PT LONG TERM GOAL #4   Title  demonstate 4-/5 Rt shoulder strength to improve use and endurance    Baseline  not tested    Time  6    Period  Weeks    Status  On-going    Target Date  11/24/17      PT LONG TERM GOAL #5   Title  demonstrate Rt shoulder A/ROM ER and IR to allow for self-care activities without limitation or substitution    Baseline  not tested due to post-op    Time  6    Period  Weeks    Status  On-going    Target Date  11/24/17      PT LONG TERM GOAL #6   Title  report < or = to 3/10 Rt shoulder pain with use for home and work tasks    Status  Achieved            Plan - 11/04/17 0845    Clinical Impression Statement  Today's session focused on therex to promote shoulder AROM without excessive shoulder shrug compensation. Noted serratus anterior weakness and provided an exercise for the pt to further work on this at home. Pt has reported discomfort in the posterior shoulder and upper trap throughout the day and at night, so therapist completed soft tissue mobilization along the teres major/minor, infraspinatus and latissimus, with pt reporting resolved pain end of session.    Rehab Potential  Good    PT Frequency  2x / week    PT Duration  6 weeks    PT Treatment/Interventions  ADLs/Self Care Home Management;Cryotherapy;Electrical Stimulation;Moist Heat;Therapeutic activities;Therapeutic exercise;Patient/family education;Neuromuscular re-education;Manual techniques;Passive range of motion;Taping;Vasopneumatic Device    PT Next Visit Plan  Rt shoulder A/ROM and AA/ROM as tolerated.  Shoulder  ER/IR ROM; serratus press open (sitting to closed chain); scap strength    PT Home Exercise Plan  Access Code: [email or web address]     Consulted and Agree with Plan of Care  Patient       Patient will benefit from skilled therapeutic intervention in order to improve the following deficits and impairments:   Pain, Decreased scar mobility, Postural dysfunction, Decreased activity tolerance, Decreased range of motion, Decreased endurance, Decreased strength, Impaired flexibility  Visit Diagnosis: Muscle weakness (generalized)  Acute pain of right shoulder  Stiffness of right shoulder, not elsewhere classified  Abnormal posture     Problem List Patient Active Problem List   Diagnosis Date Noted  . Chronic right shoulder pain 07/13/2017  . Spinal stenosis of lumbar region with neurogenic claudication 01/13/2017  . Routine general medical examination at a health care facility 05/28/2015  . Capillary hemangioma of skin 03/13/2014  . Dysplastic nevus of face 07/01/2011  . SEBORRHEIC KERATOSIS, INFLAMED 05/06/2010  . Attention deficit disorder 11/21/2009  . Hypothyroidism 12/01/2006  . Gout 12/01/2006  . Essential hypertension 12/01/2006    8:49 AM,11/04/17 Sherol Dade PT, DPT [facility] PHYSICAL THERAPY DISCHARGE SUMMARY  Visits from Start of Care: 9  Current functional level related to goals / functional outcomes: See above for current status.  Pt didn't return after 11/04/17.    Remaining deficits: See above for most current PT status.     Education / Equipment: HEP, post-op protocol and precautions Plan: Patient agrees to discharge.  Patient goals were partially met. Patient is being discharged due to not returning since the last visit.  ?????        Jose Evans, PT 12/02/17 8:51 AM  [facility] [address] [place], [place], [address] Phone: [phone]   Fax:  [phone]  Name: Jose Evans MRN: [id number] Date of Birth: 20-Aug-1945

## 2017-11-11 ENCOUNTER — Ambulatory Visit: Payer: Medicare HMO

## 2017-11-19 ENCOUNTER — Other Ambulatory Visit: Payer: Self-pay | Admitting: Family Medicine

## 2017-11-19 ENCOUNTER — Telehealth: Payer: Self-pay | Admitting: Family Medicine

## 2017-11-19 MED ORDER — AMPHETAMINE-DEXTROAMPHETAMINE 10 MG PO TABS: 10.0000 mg | ORAL_TABLET | Freq: Every day | ORAL | 0 refills | Status: DC

## 2017-11-19 NOTE — Telephone Encounter (Signed)
Copied from [place] [id number]. Topic: Inquiry >> Nov 19, 2017  9:01 AM [name] wrote: Reason for CRM: Patient called requesting a refill of amphetamine-dextroamphetamine (ADDERALL) 10 MG tablet. Patient's preferred pharmacy is [id number] [address], [place] [address] Phone: [phone] Fax: [phone].       Thank You!!!

## 2017-11-19 NOTE — Telephone Encounter (Signed)
Copied from [place] [id number]. Topic: Inquiry >> Nov 19, 2017  9:01 AM [name] wrote: Reason for CRM: Patient called requesting a refill of amphetamine-dextroamphetamine (ADDERALL) 10 MG tablet. Patient's preferred pharmacy is [id number] [address], [place] [address] Phone: [phone] Fax: [phone].       Thank You!!!

## 2017-11-19 NOTE — Telephone Encounter (Signed)
Last OV: 07/13/17 Last filled: 08/17/17, #30, 2 RF Sig: Take 1 tablet (10 mg total) by mouth daily.

## 2017-11-27 ENCOUNTER — Encounter (INDEPENDENT_AMBULATORY_CARE_PROVIDER_SITE_OTHER): Payer: Self-pay | Admitting: Orthopaedic Surgery

## 2017-11-27 ENCOUNTER — Encounter (INDEPENDENT_AMBULATORY_CARE_PROVIDER_SITE_OTHER): Payer: Self-pay

## 2017-11-27 ENCOUNTER — Ambulatory Visit (INDEPENDENT_AMBULATORY_CARE_PROVIDER_SITE_OTHER): Payer: Medicare HMO | Admitting: Orthopaedic Surgery

## 2017-11-27 VITALS — BP 163/97 | HR 54 | Ht 69.5 in | Wt 190.0 lb

## 2017-11-27 DIAGNOSIS — G8929 Other chronic pain: Secondary | ICD-10-CM

## 2017-11-27 DIAGNOSIS — M25511 Pain in right shoulder: Secondary | ICD-10-CM

## 2017-11-27 MED ORDER — DICLOFENAC SODIUM 1 % TD GEL: TRANSDERMAL | 6 refills | Status: DC

## 2017-11-27 NOTE — Progress Notes (Signed)
Office Visit Note   Patient: Jose Evans           Date of Birth: 02-23-1946           MRN: [id number] Visit Date: 11/27/2017              Requested by: Dorena Cookey, MD [place], [place] [address] PCP: Dorena Cookey, MD   Assessment & Plan: Visit Diagnoses:  1. Chronic right shoulder pain     Plan: 38-month status post rotator cuff tear repair right shoulder.  Doing well continues with exercises at home.  Still having some trouble sleeping at night.  Will try Voltaren gel return in 2 months.  Better than preop.  Still think a lot of the problem is weakness and he will continue with the exercises  Follow-Up Instructions: Return in about 2 months (around 01/27/2018).   Orders:  No orders of the defined types were placed in this encounter.  Meds ordered this encounter  Medications  . diclofenac sodium (VOLTAREN) 1 % GEL    Sig: APPLY 2-4 GRAMS TO LARGE JOINT AREA UP TO 4 TIMES A DAY    Dispense:  1 Tube    Refill:  6      Procedures: No procedures performed   Clinical Data: No additional findings.   Subjective: Chief Complaint  Patient presents with  . Follow-up    3 MO POST OP R SHOULDER BICEP AND ROTATOR CUFF TEAR REPAIR STILL SORE  Mr. Urbas is 3 months status post rotator cuff tear repair of right shoulder, SCD, DCR and biceps tenodesis.  Has been performing home exercises.  Feels better than it did before surgery but still having some discomfort at night.  He is tried some over-the-counter medicines and even CBD oil which has not really made much of a difference.  HPI  Review of Systems  Constitutional: Negative for fatigue and fever.  HENT: Negative for ear pain.   Eyes: Negative for pain.  Respiratory: Negative for cough and shortness of breath.   Cardiovascular: Negative for leg swelling.  Gastrointestinal: Negative for constipation and diarrhea.  Genitourinary: Negative for difficulty urinating.  Musculoskeletal: Positive for  neck pain. Negative for back pain.  Skin: Negative for rash.  Allergic/Immunologic: Negative for food allergies.  Neurological: Positive for weakness. Negative for numbness.  Hematological: Bruises/bleeds easily.  Psychiatric/Behavioral: Positive for sleep disturbance.     Objective: Vital Signs: BP (!) 163/97 (BP Location: Left Arm, Patient Position: Sitting, Cuff Size: Normal)   Pulse (!) 54   Ht 5' 9.5" (1.765 m)   Wt 190 lb (86.2 kg)   BMI 27.66 kg/m   Physical Exam  Ortho Exam awake alert and oriented x3.  Comfortable sitting.  Able to quickly abduct his arm 90 degrees and place his arm fully over his head and flexion.  Negative impingement.  Good strength.  Incision is healed nicely.  No localized areas of tenderness.  Specialty Comments:  No specialty comments available.  Imaging: No results found.   PMFS History: Patient Active Problem List   Diagnosis Date Noted  . Chronic right shoulder pain 07/13/2017  . Spinal stenosis of lumbar region with neurogenic claudication 01/13/2017  . Routine general medical examination at a health care facility 05/28/2015  . Capillary hemangioma of skin 03/13/2014  . Dysplastic nevus of face 07/01/2011  . SEBORRHEIC KERATOSIS, INFLAMED 05/06/2010  . Attention deficit disorder 11/21/2009  . Hypothyroidism 12/01/2006  . Gout 12/01/2006  . Essential  hypertension 12/01/2006   Past Medical History:  Diagnosis Date  . Adult acne   . Cancer ([place]) 07/2017   skin cancer  . Gout   . Hypertension   . Thyroid disease     Family History  Problem Relation Age of Onset  . Hypertension Other     Past Surgical History:  Procedure Laterality Date  . HERNIA REPAIR    . SHOULDER SURGERY    . TONSILLECTOMY     Social History   Occupational History  . Not on file  Tobacco Use  . Smoking status: Former Smoker    Packs/day: 0.50    Years: 30.00    Pack years: 15.00    Types: Cigarettes    Last attempt to quit: 09/07/2005    Years  since quitting: 12.2  . Smokeless tobacco: Never Used  Substance and Sexual Activity  . Alcohol use: Yes    Comment: FEW SHOTS A WEEK  . Drug use: No  . Sexual activity: Not on file

## 2017-12-15 ENCOUNTER — Other Ambulatory Visit (INDEPENDENT_AMBULATORY_CARE_PROVIDER_SITE_OTHER): Payer: Self-pay | Admitting: Radiology

## 2017-12-15 ENCOUNTER — Telehealth (INDEPENDENT_AMBULATORY_CARE_PROVIDER_SITE_OTHER): Payer: Self-pay | Admitting: Orthopaedic Surgery

## 2017-12-15 MED ORDER — IBUPROFEN-FAMOTIDINE 800-26.6 MG PO TABS: ORAL_TABLET | ORAL | 0 refills | Status: DC

## 2017-12-15 NOTE — Telephone Encounter (Signed)
SENT MEDICATION INTO PHARMACY AND NOTIFIED PT

## 2017-12-15 NOTE — Telephone Encounter (Signed)
Patient called stating he has heard about a cream that he would like to use for his right shoulder and is requesting a return call.

## 2017-12-15 NOTE — Telephone Encounter (Signed)
OK FOR DUEXIS

## 2017-12-15 NOTE — Telephone Encounter (Signed)
Pt was wondering if you could prescribe deuxis to try. He has used voltaren gel and it dosent seemto help as well. Please advise

## 2017-12-15 NOTE — Telephone Encounter (Signed)
Duexis 800/26.6 mg 1 tabpo tid prn #90

## 2017-12-15 NOTE — Telephone Encounter (Signed)
How would you like me to dispense it?

## 2017-12-22 ENCOUNTER — Encounter: Payer: Medicare HMO | Admitting: Family Medicine

## 2017-12-30 ENCOUNTER — Encounter: Payer: Self-pay | Admitting: Family Medicine

## 2017-12-30 ENCOUNTER — Ambulatory Visit (INDEPENDENT_AMBULATORY_CARE_PROVIDER_SITE_OTHER): Payer: Medicare HMO | Admitting: Family Medicine

## 2017-12-30 VITALS — BP 140/80 | HR 50 | Temp 98.3°F | Ht 69.0 in | Wt 196.4 lb

## 2017-12-30 DIAGNOSIS — R1084 Generalized abdominal pain: Secondary | ICD-10-CM | POA: Diagnosis not present

## 2017-12-30 DIAGNOSIS — E039 Hypothyroidism, unspecified: Secondary | ICD-10-CM | POA: Diagnosis not present

## 2017-12-30 DIAGNOSIS — N401 Enlarged prostate with lower urinary tract symptoms: Secondary | ICD-10-CM | POA: Diagnosis not present

## 2017-12-30 DIAGNOSIS — M25511 Pain in right shoulder: Secondary | ICD-10-CM | POA: Diagnosis not present

## 2017-12-30 DIAGNOSIS — Z0000 Encounter for general adult medical examination without abnormal findings: Secondary | ICD-10-CM

## 2017-12-30 DIAGNOSIS — Z23 Encounter for immunization: Secondary | ICD-10-CM | POA: Diagnosis not present

## 2017-12-30 DIAGNOSIS — I10 Essential (primary) hypertension: Secondary | ICD-10-CM

## 2017-12-30 DIAGNOSIS — R351 Nocturia: Secondary | ICD-10-CM

## 2017-12-30 DIAGNOSIS — F988 Other specified behavioral and emotional disorders with onset usually occurring in childhood and adolescence: Secondary | ICD-10-CM

## 2017-12-30 DIAGNOSIS — M10079 Idiopathic gout, unspecified ankle and foot: Secondary | ICD-10-CM | POA: Diagnosis not present

## 2017-12-30 DIAGNOSIS — G8929 Other chronic pain: Secondary | ICD-10-CM

## 2017-12-30 DIAGNOSIS — K824 Cholesterolosis of gallbladder: Secondary | ICD-10-CM

## 2017-12-30 DIAGNOSIS — R69 Illness, unspecified: Secondary | ICD-10-CM | POA: Diagnosis not present

## 2017-12-30 LAB — CBC WITH DIFFERENTIAL/PLATELET
Basophils Absolute: 0.1 10*3/uL (ref 0.0–0.1)
Basophils Relative: 1.2 % (ref 0.0–3.0)
EOS PCT: 2.3 % (ref 0.0–5.0)
Eosinophils Absolute: 0.2 10*3/uL (ref 0.0–0.7)
HEMATOCRIT: 45.1 % (ref 39.0–52.0)
HEMOGLOBIN: 15.8 g/dL (ref 13.0–17.0)
LYMPHS PCT: 22.4 % (ref 12.0–46.0)
Lymphs Abs: 1.6 10*3/uL (ref 0.7–4.0)
MCHC: 35.1 g/dL (ref 30.0–36.0)
MCV: 89.9 fl (ref 78.0–100.0)
MONOS PCT: 6.4 % (ref 3.0–12.0)
Monocytes Absolute: 0.4 10*3/uL (ref 0.1–1.0)
Neutro Abs: 4.8 10*3/uL (ref 1.4–7.7)
Neutrophils Relative %: 67.7 % (ref 43.0–77.0)
Platelets: 197 10*3/uL (ref 150.0–400.0)
RBC: 5.02 Mil/uL (ref 4.22–5.81)
RDW: 14.2 % (ref 11.5–15.5)
WBC: 7 10*3/uL (ref 4.0–10.5)

## 2017-12-30 LAB — TSH: TSH: 2.1 u[IU]/mL (ref 0.35–4.50)

## 2017-12-30 LAB — BASIC METABOLIC PANEL
BUN: 26 mg/dL — ABNORMAL HIGH (ref 6–23)
CO2: 28 mEq/L (ref 19–32)
Calcium: 9.9 mg/dL (ref 8.4–10.5)
Chloride: 103 mEq/L (ref 96–112)
Creatinine, Ser: 0.92 mg/dL (ref 0.40–1.50)
GFR: 86.39 mL/min (ref >60.00)
Glucose, Bld: 115 mg/dL — ABNORMAL HIGH (ref 70–99)
POTASSIUM: 3.7 meq/L (ref 3.5–5.1)
SODIUM: 140 meq/L (ref 135–145)

## 2017-12-30 LAB — POCT URINALYSIS DIPSTICK
Bilirubin, UA: NEGATIVE
Blood, UA: NEGATIVE
GLUCOSE UA: NEGATIVE
Ketones, UA: NEGATIVE
Leukocytes, UA: NEGATIVE
Nitrite, UA: NEGATIVE
PH UA: 6 (ref 5.0–8.0)
Protein, UA: NEGATIVE
Spec Grav, UA: 1.02 (ref 1.010–1.025)
UROBILINOGEN UA: 0.2 U/dL

## 2017-12-30 LAB — PSA: PSA: 1.18 ng/mL (ref 0.10–4.00)

## 2017-12-30 LAB — HEPATIC FUNCTION PANEL
ALT: 24 U/L (ref 0–53)
AST: 18 U/L (ref 0–37)
Albumin: 4.5 g/dL (ref 3.5–5.2)
Alkaline Phosphatase: 55 U/L (ref 39–117)
BILIRUBIN DIRECT: 0.2 mg/dL (ref 0.0–0.3)
BILIRUBIN TOTAL: 1 mg/dL (ref 0.2–1.2)
Total Protein: 6.6 g/dL (ref 6.0–8.3)

## 2017-12-30 LAB — LIPID PANEL
CHOL/HDL RATIO: 4
CHOLESTEROL: 170 mg/dL (ref 0–200)
HDL: 43.6 mg/dL (ref >39.00)
LDL CALC: 93 mg/dL (ref 0–99)
NonHDL: 126.53
Triglycerides: 168 mg/dL — ABNORMAL HIGH (ref 0.0–149.0)
VLDL: 33.6 mg/dL (ref 0.0–40.0)

## 2017-12-30 MED ORDER — FINASTERIDE 5 MG PO TABS: ORAL_TABLET | ORAL | 4 refills | Status: DC

## 2017-12-30 MED ORDER — METOPROLOL TARTRATE 50 MG PO TABS: 50.0000 mg | ORAL_TABLET | Freq: Every day | ORAL | 3 refills | Status: DC

## 2017-12-30 MED ORDER — LEVOTHYROXINE SODIUM 150 MCG PO TABS: 150.0000 ug | ORAL_TABLET | Freq: Every day | ORAL | 4 refills | Status: DC

## 2017-12-30 MED ORDER — OXYCODONE-ACETAMINOPHEN 5-325 MG PO TABS: ORAL_TABLET | ORAL | 0 refills | Status: DC

## 2017-12-30 MED ORDER — TRIAMCINOLONE ACETONIDE 0.5 % EX OINT: TOPICAL_OINTMENT | CUTANEOUS | 3 refills | Status: DC

## 2017-12-30 MED ORDER — ALLOPURINOL 300 MG PO TABS: 300.0000 mg | ORAL_TABLET | Freq: Every day | ORAL | 4 refills | Status: DC

## 2017-12-30 MED ORDER — SILDENAFIL CITRATE 20 MG PO TABS: ORAL_TABLET | ORAL | 5 refills | Status: DC

## 2017-12-30 MED ORDER — TRIAMTERENE-HCTZ 75-50 MG PO TABS: 1.0000 | ORAL_TABLET | Freq: Every day | ORAL | 3 refills | Status: DC

## 2017-12-30 NOTE — Progress Notes (Signed)
ekg 

## 2017-12-30 NOTE — Patient Instructions (Signed)
Labs today............. I will call a physician anything abnormal  Continue current meds  Call your insurance company to find out where you can get the shingles vaccine  We'll get you set up for an ultrasound of your gallbladder to be in the evaluation of the abdominal pain you're experiencing  We'll also get you set up a consult in GI to consider the different ways to screen for colon cancer

## 2017-12-30 NOTE — Progress Notes (Signed)
Jose Evans is a delightful 72 year old married male nonsmoker who comes in today for evaluation of the following issues  He has a history of gout for which he takes allopurinol 300 mg daily  He has a history of adult ADD for which she takes Adderall 10 mg daily  He has a history of BPH which takes Proscar 5 mg daily He has a history of hypothyroidism for which she takes Synthroid 150 g daily  He has history of osteoarthritis and he takes Mobic 7.5 mg daily. At surgery in his right shoulder in the spring by Dr. Durward Fortes. He still has pain in that shoulder and marked decreased range of motion despite aggressive PT. He's tried all the common pain medications and nothing helps. There are anything that helps is the Percocet 7.5 mg one half tab at bedtime.  He has a history of hypertension for which he takes Lopressor 50 mg daily  He has history mild ED for which he uses generic Viagra He also takes [place] for the hypertension  He states he's having increasing spells of abdominal pain. He describes as a fullness feeling his abdomen seems to radiate up into his chest and his back of his neck. He sometimes feels lightheaded like he cannot pass out. Some his episodes related to eating.  He gets routine eye care. Never had a colonoscopy he continues to decline having a colonoscopy. He would be willing to talk with the folks in GI about the new genetic screening test for colon cancer.  He gets regular dental care  Vaccinations up-to-date seasonal flu given today  Social history...........[name] Married lives here in [place] used to work for 30 years for an [employer] in [place]. After he had his shoulder surgery in May his company fired him. He's now be had been rehired by a local company here [place] it grows [employer]  14 point review of systems reviewed and otherwise negative except for noted above.  Cognitive function normally exercises daily home health safety reviewed no issues  identified, no guns in the house, he does have a healthcare power of attorney and living well.  BP 140/80 (BP Location: Left Arm, Patient Position: Sitting, Cuff Size: Normal)   Pulse (!) 50   Temp 98.3 F (36.8 C) (Oral)   Ht 5\' 9"  (1.753 m)   Wt 196 lb 6 oz (89.1 kg)   SpO2 96%   BMI 29.00 kg/m  Well-developed well-nourished male no acute distress vital signs stable he's afebrile examination HEENT were negative neck was supple thyroid not enlarged no carotid bruits. Cardiopulmonary exam normal. Abdominal exam normal. Genitalia normal circumcised male. Rectal normal stool guaiac-negative prostate smooth nonnodular 2+ BPH. Extremities normal skin no peripheral pulses normal  #1 history of gout........ Continue allopurinol  #2 adult ADD......... Continue Adderall  #3 BPH.....Marland KitchenMarland KitchenThe current medical regimen is effective;  continue present plan and medications. Proscar  #4 hypothyroidism......... Continue Synthroid  #5 hypertension......... Continue Lopressor and Dyazide  #6 mild ED.......... Continue generic Viagra  #7 abdominal pain.......[name] Rule out gallbladder disease..... Begin workup with ultrasound........... HIDA scan if ultrasound normal,,,,,,,,,,, possible GI consult  #8 status post right shoulder surgery Dr. Durward Fortes with persistent pain and decreased range of motion

## 2018-01-05 ENCOUNTER — Telehealth: Payer: Self-pay | Admitting: *Deleted

## 2018-01-05 NOTE — Telephone Encounter (Signed)
Left message on machine for patient to return our call.  Patient should go for his ultrasound per Dr Sherren Mocha to rule out gall stones.

## 2018-01-06 NOTE — Telephone Encounter (Signed)
Pt states he has his [place] scheduled for Friday.

## 2018-01-06 NOTE — Telephone Encounter (Signed)
noted 

## 2018-01-08 ENCOUNTER — Ambulatory Visit
Admission: RE | Admit: 2018-01-08 | Discharge: 2018-01-08 | Disposition: A | Discharge status: Home / Self Care | Payer: Medicare HMO | Source: Ambulatory Visit | Attending: Family Medicine | Admitting: Family Medicine

## 2018-01-08 DIAGNOSIS — R1084 Generalized abdominal pain: Secondary | ICD-10-CM

## 2018-01-19 ENCOUNTER — Ambulatory Visit
Admission: RE | Admit: 2018-01-19 | Discharge: 2018-01-19 | Disposition: A | Discharge status: Home / Self Care | Payer: Medicare HMO | Source: Ambulatory Visit | Attending: Family Medicine | Admitting: Family Medicine

## 2018-01-19 DIAGNOSIS — K8020 Calculus of gallbladder without cholecystitis without obstruction: Secondary | ICD-10-CM | POA: Diagnosis not present

## 2018-01-19 NOTE — Addendum Note (Signed)
Addended by: Gwenyth Ober R on: 01/19/2018 01:50 PM   Modules accepted: Orders

## 2018-01-19 NOTE — Addendum Note (Signed)
Addended by: Gwenyth Ober R on: 01/19/2018 02:04 PM   Modules accepted: Orders

## 2018-01-20 NOTE — Addendum Note (Signed)
Addended by: Gwenyth Ober R on: 01/20/2018 08:40 AM   Modules accepted: Orders

## 2018-01-21 ENCOUNTER — Telehealth: Payer: Self-pay | Admitting: Family Medicine

## 2018-01-21 NOTE — Telephone Encounter (Signed)
Spoke to the pt and informed him of his ultrasound results.  Advised that a referral for general surgery has been placed and he should be called for an appointment.  No further action required.

## 2018-01-21 NOTE — Telephone Encounter (Signed)
Copied from [place] [id number]. Topic: Quick Communication - Office Called Patient >> Jan 21, 2018  2:50 PM Hewitt Shorts wrote: Pt states that the office called in regards to surgery and he is returning the call -no notes in chart   Best number   [phone]

## 2018-01-22 ENCOUNTER — Other Ambulatory Visit: Payer: Self-pay | Admitting: Family Medicine

## 2018-01-22 NOTE — Telephone Encounter (Signed)
Left message to return phone call.

## 2018-01-22 NOTE — Telephone Encounter (Signed)
Spoke to pt and he stated that he needed a refill on this Rx for tooth issues. I exxplained to pt that he would need to f/u with his dentist for the issue. Pt claimed that he can barely get in touch with his dentist. I explained to pt that this Rx is not something that is routinely prescribed and that I will speak to Dr.Todd regarding the issue.   Please advise

## 2018-01-25 ENCOUNTER — Encounter (INDEPENDENT_AMBULATORY_CARE_PROVIDER_SITE_OTHER): Payer: Self-pay | Admitting: Orthopaedic Surgery

## 2018-01-25 ENCOUNTER — Ambulatory Visit (INDEPENDENT_AMBULATORY_CARE_PROVIDER_SITE_OTHER): Payer: Medicare HMO | Admitting: Orthopaedic Surgery

## 2018-01-25 VITALS — BP 161/95 | HR 63 | Ht 69.0 in | Wt 196.0 lb

## 2018-01-25 DIAGNOSIS — M25511 Pain in right shoulder: Secondary | ICD-10-CM | POA: Diagnosis not present

## 2018-01-25 DIAGNOSIS — G8929 Other chronic pain: Secondary | ICD-10-CM

## 2018-01-25 MED ORDER — METHYLPREDNISOLONE ACETATE 40 MG/ML IJ SUSP
80.0000 mg | INTRAMUSCULAR | Status: AC | PRN
  Administered 2018-01-25: 80 mg

## 2018-01-25 MED ORDER — LIDOCAINE HCL 2 % IJ SOLN
2.0000 mL | INTRAMUSCULAR | Status: AC | PRN
  Administered 2018-01-25: 2 mL

## 2018-01-25 MED ORDER — BUPIVACAINE HCL 0.5 % IJ SOLN
2.0000 mL | INTRAMUSCULAR | Status: AC | PRN
  Administered 2018-01-25: 2 mL via INTRA_ARTICULAR

## 2018-01-25 NOTE — Progress Notes (Signed)
Office Visit Note   Patient: Jose Evans           Date of Birth: 02-02-1946           MRN: [id number] Visit Date: 01/25/2018              Requested by: Dorena Cookey, MD [place], [place] [address] PCP: Dorena Cookey, MD   Assessment & Plan: Visit Diagnoses:  1. Chronic right shoulder pain     Plan: Mr. Jose Evans is 5 months status post rotator cuff tear repair biceps tenodesis with an SCD and DCR right shoulder he is still having some discomfort although he feels better in terms of motion and strength.  He is even having some pain and rest when he works at his computer when his arms at his side.  No numbness or tingling.  Not sure why is having trouble as his impingement testing is perfectly normal and range of motion is normal.  From a diagnostic and therapeutic standpoint I will try a subacromial cortisone injection to his response.  I would consider MRI scan of the shoulder month or 2.  Continue working on his strength  Follow-Up Instructions: Return in about 2 months (around 03/27/2018).   Orders:  No orders of the defined types were placed in this encounter.  No orders of the defined types were placed in this encounter.     Procedures: Large Joint Inj: R subacromial bursa on 01/25/2018 8:46 AM Indications: pain and diagnostic evaluation Details: 25 G 1.5 in needle, anterolateral approach  Arthrogram: No  Medications: 2 mL lidocaine 2 %; 2 mL bupivacaine 0.5 %; 80 mg methylPREDNISolone acetate 40 MG/ML Consent was given by the patient. Immediately prior to procedure a time out was called to verify the correct patient, procedure, equipment, support staff and site/side marked as required. Patient was prepped and draped in the usual sterile fashion.       Clinical Data: No additional findings.   Subjective: Chief Complaint  Patient presents with  . Follow-up    R SHOULDER GETTING MORE FLEXIZBILITY BUT HAS CONSTANT PAIN  Jose Evans is 5 months  status post right shoulder rotator cuff tear repair and biceps tenodesis.  He also had an SCD and DCR.  He actually had been doing well except for some weakness  As expected.  Over the past month or 2 he is had more pain even at rest.  He denies any neck pain.  He has not had any chest pain or shortness of breath.  He has not had any numbness or tingling.  He actually is noticed the pain when he is totally at rest or when he is working at his computer.  He feels some of that might be tension.  He is been working on his exercises and feels like he has better motion and even better strength.  "Just seems to ache all the time".  HPI  Review of Systems   Objective: Vital Signs: BP (!) 161/95 (BP Location: Left Arm, Patient Position: Sitting, Cuff Size: Normal)   Pulse 63   Ht 5\' 9"  (1.753 m)   Wt 196 lb (88.9 kg)   BMI 28.94 kg/m   Physical Exam  Constitutional: He is oriented to person, place, and time. He appears well-developed and well-nourished.  HENT:  Mouth/Throat: Oropharynx is clear and moist.  Eyes: Pupils are equal, round, and reactive to light. EOM are normal.  Pulmonary/Chest: Effort normal.  Neurological: He is alert  and oriented to person, place, and time.  Skin: Skin is warm and dry.  Psychiatric: He has a normal mood and affect. His behavior is normal.    Ortho Exam awake alert and oriented x3.  Comfortable sitting.  Full quick overhead motion flexion and abduction.  Able to scratch the middle of his back..  Negative crossarm test.  Negative speeds sign.  Biceps intact.  Skin intact.  Appears to have good strength.  Painless range of motion of cervical spine.  No referred pain to either upper extremity with motion of his neck.  Negative impingement testing.  Negative empty can.  Some mild tenderness in the anterior subacromial region but not at the acromioclavicular joint  Specialty Comments:  No specialty comments available.  Imaging: No results found.   PMFS  History: Patient Active Problem List   Diagnosis Date Noted  . Generalized abdominal pain 12/30/2017  . Chronic right shoulder pain 07/13/2017  . Spinal stenosis of lumbar region with neurogenic claudication 01/13/2017  . Routine general medical examination at a health care facility 05/28/2015  . Capillary hemangioma of skin 03/13/2014  . Dysplastic nevus of face 07/01/2011  . SEBORRHEIC KERATOSIS, INFLAMED 05/06/2010  . Attention deficit disorder 11/21/2009  . Hypothyroidism 12/01/2006  . Gout 12/01/2006  . Essential hypertension 12/01/2006   Past Medical History:  Diagnosis Date  . Adult acne   . Cancer ([place]) 07/2017   skin cancer  . Gout   . Hypertension   . Thyroid disease     Family History  Problem Relation Age of Onset  . Hypertension Other     Past Surgical History:  Procedure Laterality Date  . HERNIA REPAIR    . SHOULDER SURGERY    . TONSILLECTOMY     Social History   Occupational History  . Not on file  Tobacco Use  . Smoking status: Former Smoker    Packs/day: 0.50    Years: 30.00    Pack years: 15.00    Types: Cigarettes    Last attempt to quit: 09/07/2005    Years since quitting: 12.3  . Smokeless tobacco: Never Used  Substance and Sexual Activity  . Alcohol use: Yes    Comment: FEW SHOTS A WEEK  . Drug use: No  . Sexual activity: Not on file     Garald Balding, MD   Note - This record has been created using [employer].  Chart creation errors have been sought, but may not always  have been located. Such creation errors do not reflect on  the standard of medical care.

## 2018-01-25 NOTE — Progress Notes (Signed)
We need to get you set up to see 1 of the general surgeons for evaluation of your abnormal gallbladder.  If nobody calls you in the next 3 days with an appointment please call us back

## 2018-01-28 ENCOUNTER — Telehealth (INDEPENDENT_AMBULATORY_CARE_PROVIDER_SITE_OTHER): Payer: Self-pay | Admitting: Orthopaedic Surgery

## 2018-01-28 NOTE — Telephone Encounter (Signed)
Please advise 

## 2018-01-28 NOTE — Telephone Encounter (Signed)
Patient thinks he torn muscle in lt bicep this time. Patient is out of town, and cannot come in for an appt this week. Patient would like to know what he needs to do, or if he is doing anything wrong with this arm. Please call to advise.

## 2018-01-28 NOTE — Telephone Encounter (Signed)
Please call patient and schedule appt to see Dr. Durward Fortes ASAP. Thank you.

## 2018-01-28 NOTE — Telephone Encounter (Signed)
This is his other arm (left).  Told to make an appt for early next week

## 2018-01-29 ENCOUNTER — Ambulatory Visit (INDEPENDENT_AMBULATORY_CARE_PROVIDER_SITE_OTHER): Payer: Medicare HMO | Admitting: Orthopaedic Surgery

## 2018-01-29 NOTE — Telephone Encounter (Signed)
Per patient request, an appt was made for Thursday 02/04/2018.

## 2018-02-02 ENCOUNTER — Encounter: Payer: Self-pay | Admitting: Gastroenterology

## 2018-02-04 ENCOUNTER — Encounter (INDEPENDENT_AMBULATORY_CARE_PROVIDER_SITE_OTHER): Payer: Self-pay | Admitting: Orthopaedic Surgery

## 2018-02-04 ENCOUNTER — Ambulatory Visit (INDEPENDENT_AMBULATORY_CARE_PROVIDER_SITE_OTHER): Payer: Medicare HMO | Admitting: Orthopaedic Surgery

## 2018-02-04 VITALS — BP 151/86 | HR 62 | Ht 69.0 in | Wt 196.0 lb

## 2018-02-04 DIAGNOSIS — M25512 Pain in left shoulder: Secondary | ICD-10-CM

## 2018-02-04 NOTE — Progress Notes (Signed)
Office Visit Note   Patient: Jose Evans           Date of Birth: May 23, 1945           MRN: [id number] Visit Date: 02/04/2018              Requested by: Jose Cookey, MD [place], [place] [address] PCP: Jose Cookey, MD   Assessment & Plan: Visit Diagnoses:  1. Acute pain of left shoulder     Plan: 1 week status post acute onset of left shoulder pain.  There is some ecchymosis and bruising that I think is related to the deltoid muscle.  All is functioning and is not having any pain.  The biceps appears to be in a normal position he is not having any pain with overhead motion.  I think this will slowly resolve without any problem.  I would like him to return to consider MRI scan if he has any issues with his shoulder over the next 3 to 4 weeks Follow-Up Instructions: Return if symptoms worsen or fail to improve.   Orders:  No orders of the defined types were placed in this encounter.  No orders of the defined types were placed in this encounter.     Procedures: No procedures performed   Clinical Data: No additional findings.   Subjective: Chief Complaint  Patient presents with  . Follow-up    L BICEP INJURY 1 WEEK AGO FELT POP AND WAS PAINFUL AND SWELLING AND HAS BRUISING  [name] noted acute onset of left shoulder pain 1 week ago when reaching for an object.  He subsequently has developed an area of ecchymosis along the distal deltoid on the lateral aspect of his arm.  Is not had any numbness or tingling.  Not having any present pain.  Denies any neck discomfort and it does not have any trouble with overhead motion of the shoulder.  HPI  Review of Systems  Constitutional: Negative for fatigue and fever.  HENT: Negative for ear pain.   Eyes: Negative for pain.  Respiratory: Negative for cough and shortness of breath.   Cardiovascular: Negative for leg swelling.  Gastrointestinal: Negative for constipation and diarrhea.  Genitourinary:  Negative for difficulty urinating.  Musculoskeletal: Negative for back pain and neck pain.  Skin: Negative for rash.  Allergic/Immunologic: Negative for food allergies.  Neurological: Negative for weakness and numbness.  Hematological: Bruises/bleeds easily.  Psychiatric/Behavioral: Negative for sleep disturbance.     Objective: Vital Signs: BP (!) 151/86 (BP Location: Right Arm, Patient Position: Sitting, Cuff Size: Normal)   Pulse 62   Ht 5\' 9"  (1.753 m)   Wt 196 lb (88.9 kg)   BMI 28.94 kg/m   Physical Exam  Constitutional: He is oriented to person, place, and time. He appears well-developed and well-nourished.  HENT:  Mouth/Throat: Oropharynx is clear and moist.  Eyes: Pupils are equal, round, and reactive to light. EOM are normal.  Pulmonary/Chest: Effort normal.  Neurological: He is alert and oriented to person, place, and time.  Skin: Skin is warm and dry.  Psychiatric: He has a normal mood and affect. His behavior is normal.    Ortho Exam awake alert and oriented x3.  Comfortable sitting.  Examination of the left shoulder demonstrates normal quick overhead motion of the shoulder.  Negative empty can testing.  Negative impingement testing.  Biceps appears to be in its normal position.  There is an area of ecchymosis about 4 to 5  cm in diameter just distal to the lateral deltoid no significant discomfort.  Some firmness of the deltoid muscle.  Abduction is normal.  Distal biceps tendon intact.  No distal edema or ecchymosis.  Good grip and good release. Specialty Comments:  No specialty comments available.  Imaging: No results found.   PMFS History: Patient Active Problem List   Diagnosis Date Noted  . Generalized abdominal pain 12/30/2017  . Chronic right shoulder pain 07/13/2017  . Spinal stenosis of lumbar region with neurogenic claudication 01/13/2017  . Routine general medical examination at a health care facility 05/28/2015  . Capillary hemangioma of skin  03/13/2014  . Dysplastic nevus of face 07/01/2011  . SEBORRHEIC KERATOSIS, INFLAMED 05/06/2010  . Attention deficit disorder 11/21/2009  . Hypothyroidism 12/01/2006  . Gout 12/01/2006  . Essential hypertension 12/01/2006   Past Medical History:  Diagnosis Date  . Adult acne   . Cancer ([place] [facility]) 07/2017   skin cancer  . Gout   . Hypertension   . Thyroid disease     Family History  Problem Relation Age of Onset  . Hypertension Other     Past Surgical History:  Procedure Laterality Date  . HERNIA REPAIR    . SHOULDER SURGERY    . TONSILLECTOMY     Social History   Occupational History  . Not on file  Tobacco Use  . Smoking status: Former Smoker    Packs/day: 0.50    Years: 30.00    Pack years: 15.00    Types: Cigarettes    Last attempt to quit: 09/07/2005    Years since quitting: 12.4  . Smokeless tobacco: Never Used  Substance and Sexual Activity  . Alcohol use: Yes    Comment: FEW SHOTS A WEEK  . Drug use: No  . Sexual activity: Not on file

## 2018-02-09 ENCOUNTER — Telehealth: Payer: Self-pay | Admitting: Family Medicine

## 2018-02-09 NOTE — Telephone Encounter (Signed)
Copied from [place] [id number]. Topic: Quick Communication - Rx Refill/Question >> Feb 09, 2018  2:48 PM Margot Ables wrote: Medication: amphetamine-dextroamphetamine (ADDERALL) 10 MG tablet - pt has 6-7 days left  Has the patient contacted their pharmacy? No - controlled Preferred Pharmacy (with phone number or street name): [id number] [place], [place] [phone] (Phone) [phone] (Fax)

## 2018-02-10 NOTE — Telephone Encounter (Signed)
Okay for refill? Please advise 

## 2018-02-11 ENCOUNTER — Other Ambulatory Visit: Payer: Self-pay | Admitting: Family Medicine

## 2018-02-11 MED ORDER — AMPHETAMINE-DEXTROAMPHETAMINE 10 MG PO TABS: 10.0000 mg | ORAL_TABLET | Freq: Every day | ORAL | 0 refills | Status: DC

## 2018-03-02 ENCOUNTER — Ambulatory Visit: Payer: Medicare HMO | Admitting: Gastroenterology

## 2018-03-03 ENCOUNTER — Encounter: Payer: Self-pay | Admitting: Adult Health

## 2018-03-03 ENCOUNTER — Ambulatory Visit (INDEPENDENT_AMBULATORY_CARE_PROVIDER_SITE_OTHER): Payer: Medicare HMO | Admitting: Adult Health

## 2018-03-03 VITALS — BP 148/90 | Temp 97.5°F | Wt 198.0 lb

## 2018-03-03 DIAGNOSIS — E039 Hypothyroidism, unspecified: Secondary | ICD-10-CM

## 2018-03-03 DIAGNOSIS — Z7689 Persons encountering health services in other specified circumstances: Secondary | ICD-10-CM | POA: Diagnosis not present

## 2018-03-03 DIAGNOSIS — I10 Essential (primary) hypertension: Secondary | ICD-10-CM | POA: Diagnosis not present

## 2018-03-03 DIAGNOSIS — F988 Other specified behavioral and emotional disorders with onset usually occurring in childhood and adolescence: Secondary | ICD-10-CM

## 2018-03-03 DIAGNOSIS — R69 Illness, unspecified: Secondary | ICD-10-CM | POA: Diagnosis not present

## 2018-03-03 DIAGNOSIS — M10079 Idiopathic gout, unspecified ankle and foot: Secondary | ICD-10-CM | POA: Diagnosis not present

## 2018-03-03 MED ORDER — AMPHETAMINE-DEXTROAMPHET ER 10 MG PO CP24: 10.0000 mg | ORAL_CAPSULE | Freq: Every day | ORAL | 0 refills | Status: DC

## 2018-03-03 NOTE — Progress Notes (Signed)
Patient presents to clinic today to establish care. He is a pleasant 72 year old male who  has a past medical history of Adult acne, Cancer ([place]) (07/2017), Gout, Hypertension, and Thyroid disease.  He is a former patient of Dr. Sherren Mocha who I am seeing today to transfer care   His last annual exam was in 12/2017   Acute Concerns: Establish Care  Chronic Issues:  H/o Gout - takes Allopurinol 300 mg daily. No recent gout flares.   H/o adult ADD - takes Adderall 10 mg daily. Feels as though this dose is not working well for him and it only lasts about 4 hours.   BPH - takes Proscar 5 mg daily  Hypothyroidism - Takes Synthroid 150 mg daily   History of osteoarthritis - he takes Mobic 7.5 mg daily. Has history of right shoulder surgery. He continues to have pain in the shoulder and marked decreased range of motion despite aggressive PT.   Essential Hypertension - Takes Lopressor 50 mg daily and Maxide 75-50   BP Readings from Last 3 Encounters:  03/03/18 (!) 148/90  02/04/18 (!) 151/86  01/25/18 (!) 161/95   Health Maintenance: Dental -- Routine Care  Vision -- Routine Care - Dr. Einar Gip  Immunizations -- UTD  Colonoscopy -- Refuses colonoscopy and colo guard  Diet: Does not eat healthy, does not eat out a lot.  Exercise: Does not exercise on a routine basis. Stays active during the day.    Past Medical History:  Diagnosis Date  . Adult acne   . Cancer ([place]) 07/2017   skin cancer  . Gout   . Hypertension   . Thyroid disease     Past Surgical History:  Procedure Laterality Date  . HERNIA REPAIR    . SHOULDER SURGERY    . TONSILLECTOMY      Current Outpatient Medications on File Prior to Visit  Medication Sig Dispense Refill  . allopurinol (ZYLOPRIM) 300 MG tablet Take 1 tablet (300 mg total) by mouth daily. 100 tablet 4  . amphetamine-dextroamphetamine (ADDERALL) 10 MG tablet Take 1 tablet (10 mg total) by mouth daily. 30 tablet 0  .  amphetamine-dextroamphetamine (ADDERALL) 10 MG tablet Take 1 tablet (10 mg total) by mouth daily. 30 tablet 0  . amphetamine-dextroamphetamine (ADDERALL) 10 MG tablet Take 1 tablet (10 mg total) by mouth daily. 30 tablet 0  . diclofenac sodium (VOLTAREN) 1 % GEL APPLY 2-4 GRAMS TO LARGE JOINT AREA UP TO 4 TIMES A DAY 1 Tube 6  . finasteride (PROSCAR) 5 MG tablet One by mouth daily 100 tablet 4  . levothyroxine (SYNTHROID, LEVOTHROID) 150 MCG tablet Take 1 tablet (150 mcg total) by mouth daily. 90 tablet 4  . meloxicam (MOBIC) 7.5 MG tablet Take 1 tablet by mouth daily as needed.  2  . metoprolol tartrate (LOPRESSOR) 50 MG tablet Take 1 tablet (50 mg total) by mouth daily. 90 tablet 3  . neomycin-polymyxin-hydrocortisone (CORTISPORIN) otic solution Uses directed 10 mL 1  . sildenafil (REVATIO) 20 MG tablet TAKE 1 TO 2 TABLETS BY MOUTH 2 TO 3 HRS BEFORE SEX 30 tablet 5  . triamcinolone ointment (KENALOG) 0.5 % Apply small amounts daily 30 g 3  . triamterene-hydrochlorothiazide (MAXZIDE) 75-50 MG tablet Take 1 tablet by mouth daily. 90 tablet 3  . amoxicillin (AMOXIL) 500 MG capsule TAKE 4 CAPSULES BY MOUTH NOW THEN 1 CAPSULE 3 TIMES A DAY UNTIL GONE (Patient not taking: Reported on 03/03/2018) 25 capsule 0  .  doxycycline (VIBRAMYCIN) 100 MG capsule TAKE 1 CAPSULE (100 MG TOTAL) BY MOUTH 2 (TWO) TIMES DAILY. (Patient not taking: Reported on 03/03/2018) 30 capsule 3   No current facility-administered medications on file prior to visit.     No Known Allergies  Family History  Problem Relation Age of Onset  . Hypertension Other     Social History   Socioeconomic History  . Marital status: Married    Spouse name: Not on file  . Number of children: Not on file  . Years of education: Not on file  . Highest education level: Not on file  Occupational History  . Not on file  Social Needs  . Financial resource strain: Not on file  . Food insecurity:    Worry: Not on file    Inability: Not on  file  . Transportation needs:    Medical: Not on file    Non-medical: Not on file  Tobacco Use  . Smoking status: Former Smoker    Packs/day: 0.50    Years: 30.00    Pack years: 15.00    Types: Cigarettes    Last attempt to quit: 09/07/2005    Years since quitting: 12.4  . Smokeless tobacco: Never Used  Substance and Sexual Activity  . Alcohol use: Yes    Comment: FEW SHOTS A WEEK  . Drug use: No  . Sexual activity: Not on file  Lifestyle  . Physical activity:    Days per week: Not on file    Minutes per session: Not on file  . Stress: Not on file  Relationships  . Social connections:    Talks on phone: Not on file    Gets together: Not on file    Attends religious service: Not on file    Active member of club or organization: Not on file    Attends meetings of clubs or organizations: Not on file    Relationship status: Not on file  . Intimate partner violence:    Fear of current or ex partner: Not on file    Emotionally abused: Not on file    Physically abused: Not on file    Forced sexual activity: Not on file  Other Topics Concern  . Not on file  Social History Narrative   Works for [employer]    Married   m    Review of Systems  Constitutional: Negative.   HENT: Negative.   Eyes: Negative.   Respiratory: Negative.   Cardiovascular: Negative.   Gastrointestinal: Negative.   Genitourinary: Negative.   Musculoskeletal: Negative.   Skin: Negative.   Neurological: Negative.   Endo/Heme/Allergies: Negative.   Psychiatric/Behavioral: Negative.   All other systems reviewed and are negative.     BP (!) 148/90   Temp (!) 97.5 F (36.4 C) (Oral)   Wt 198 lb (89.8 kg)   BMI 29.24 kg/m   Physical Exam  Recent Results (from the past 2160 hour(s))  Lipid panel     Status: Abnormal   Collection Time: 12/30/17 12:21 PM  Result Value Ref Range   Cholesterol 170 0 - 200 mg/dL    Comment: ATP III Classification       Desirable:  < 200 mg/dL                Borderline High:  200 - 239 mg/dL          High:  > = 240 mg/dL   Triglycerides 168.0 (H) 0.0 - 149.0 mg/dL  Comment: Normal:  <150 mg/dLBorderline High:  150 - 199 mg/dL   HDL 43.60 >39.00 mg/dL   VLDL 33.6 0.0 - 40.0 mg/dL   LDL Cholesterol 93 0 - 99 mg/dL   Total CHOL/HDL Ratio 4     Comment:                Men          Women1/2 Average Risk     3.4          3.3Average Risk          5.0          4.42X Average Risk          9.6          7.13X Average Risk          15.0          11.0                       NonHDL 126.53     Comment: NOTE:  Non-HDL goal should be 30 mg/dL higher than patient's LDL goal (i.e. LDL goal of < 70 mg/dL, would have non-HDL goal of < 100 mg/dL)  Hepatic function panel     Status: None   Collection Time: 12/30/17 12:21 PM  Result Value Ref Range   Total Bilirubin 1.0 0.2 - 1.2 mg/dL   Bilirubin, Direct 0.2 0.0 - 0.3 mg/dL   Alkaline Phosphatase 55 39 - 117 U/L   AST 18 0 - 37 U/L   ALT 24 0 - 53 U/L   Total Protein 6.6 6.0 - 8.3 g/dL   Albumin 4.5 3.5 - 5.2 g/dL  PSA     Status: None   Collection Time: 12/30/17 12:21 PM  Result Value Ref Range   PSA 1.18 0.10 - 4.00 ng/mL    Comment: Test performed using Access Hybritech PSA Assay, a parmagnetic partical, chemiluminecent immunoassay.  CBC with Differential/Platelet     Status: None   Collection Time: 12/30/17 12:21 PM  Result Value Ref Range   WBC 7.0 4.0 - 10.5 K/uL   RBC 5.02 4.22 - 5.81 Mil/uL   Hemoglobin 15.8 13.0 - 17.0 g/dL   HCT 45.1 39.0 - 52.0 %   MCV 89.9 78.0 - 100.0 fl   MCHC 35.1 30.0 - 36.0 g/dL   RDW 14.2 11.5 - 15.5 %   Platelets 197.0 150.0 - 400.0 K/uL   Neutrophils Relative % 67.7 43.0 - 77.0 %   Lymphocytes Relative 22.4 12.0 - 46.0 %   Monocytes Relative 6.4 3.0 - 12.0 %   Eosinophils Relative 2.3 0.0 - 5.0 %   Basophils Relative 1.2 0.0 - 3.0 %   Neutro Abs 4.8 1.4 - 7.7 K/uL   Lymphs Abs 1.6 0.7 - 4.0 K/uL   Monocytes Absolute 0.4 0.1 - 1.0 K/uL   Eosinophils  Absolute 0.2 0.0 - 0.7 K/uL   Basophils Absolute 0.1 0.0 - 0.1 K/uL  TSH     Status: None   Collection Time: 12/30/17 12:21 PM  Result Value Ref Range   TSH 2.10 0.35 - 4.50 uIU/mL  Basic metabolic panel     Status: Abnormal   Collection Time: 12/30/17 12:21 PM  Result Value Ref Range   Sodium 140 135 - 145 mEq/L   Potassium 3.7 3.5 - 5.1 mEq/L   Chloride 103 96 - 112 mEq/L   CO2 28 19 - 32 mEq/L   Glucose, Bld  115 (H) 70 - 99 mg/dL   BUN 26 (H) 6 - 23 mg/dL   Creatinine, Ser 0.92 0.40 - 1.50 mg/dL   Calcium 9.9 8.4 - 10.5 mg/dL   GFR 86.39 >60.00 mL/min  POCT urinalysis dipstick     Status: None   Collection Time: 12/30/17  2:52 PM  Result Value Ref Range   Color, UA Yellow    Clarity, UA Clear    Glucose, UA Negative Negative   Bilirubin, UA Negative    Ketones, UA Negative    Spec Grav, UA 1.020 1.010 - 1.025   Blood, UA Negative    pH, UA 6.0 5.0 - 8.0   Protein, UA Negative Negative   Urobilinogen, UA 0.2 0.2 or 1.0 E.U./dL   Nitrite, UA Negative    Leukocytes, UA Negative Negative   Appearance     Odor      Assessment/Plan: 1. Encounter to establish care - Follow up in September for CPE  - Follow up sooner if needed - Encouraged to work on diet and exercise   2. Hypothyroidism, unspecified type - Continue with synthroid   3. Idiopathic gout involving toe, unspecified chronicity, unspecified laterality - Continue with Allopurinol   4. Attention deficit disorder (ADD) without hyperactivity - Will trial him on XR  - He will let me know if this works better for him  - Follow up every 6 months or sooner if needed - amphetamine-dextroamphetamine (ADDERALL XR) 10 MG 24 hr capsule; Take 1 capsule (10 mg total) by mouth daily.  Dispense: 30 capsule; Refill: 0  5. Essential hypertension - Not at goal. Took meds 30 min PTA - Will continue to monitor   [name], NP

## 2018-03-10 DIAGNOSIS — H524 Presbyopia: Secondary | ICD-10-CM | POA: Diagnosis not present

## 2018-04-16 ENCOUNTER — Other Ambulatory Visit: Payer: Self-pay | Admitting: Adult Health

## 2018-04-16 MED ORDER — AMPHETAMINE-DEXTROAMPHETAMINE 10 MG PO TABS: 10.0000 mg | ORAL_TABLET | Freq: Every day | ORAL | 0 refills | Status: DC

## 2018-04-16 NOTE — Telephone Encounter (Signed)
Copied from [place] [id number]. Topic: Quick Communication - Rx Refill/Question >> Apr 16, 2018 11:21 AM Jose Evans wrote: Medication: amphetamine-dextroamphetamine (ADDERALL) 10 MG tablet wants to go back to this RX instead of the extended release  Has the patient contacted their pharmacy? Yes.   (Agent: If no, request that the patient contact the pharmacy for the refill.) (Agent: If yes, when and what did the pharmacy advise?) call the office because provider retired his Doctor number isn't any good anymore  Preferred Pharmacy (with phone number or street name): [id number] [address], [place] [phone] (Phone) [phone] (Fax)    Agent: Please be advised that RX refills may take up to 3 business days. We ask that you follow-up with your pharmacy.

## 2018-05-20 ENCOUNTER — Other Ambulatory Visit: Payer: Self-pay | Admitting: Adult Health

## 2018-05-20 MED ORDER — AMPHETAMINE-DEXTROAMPHETAMINE 10 MG PO TABS: 10.0000 mg | ORAL_TABLET | Freq: Every day | ORAL | 0 refills | Status: DC

## 2018-05-20 NOTE — Telephone Encounter (Signed)
Copied from [place] [id number] [phone]. Topic: Quick Communication - See Telephone Encounter >> May 20, 2018  9:09 AM Conception Chancy, NT wrote: CRM for notification. See Telephone encounter for: 05/20/18.  Patient is calling and requesting a 3 month refill on amphetamine-dextroamphetamine (ADDERALL) 10 MG tablet.  [id number] [address], [place] [address] Phone: [phone] Fax: [phone]

## 2018-05-20 NOTE — Telephone Encounter (Signed)
Looks like pt is on extended release but not on active medication list.  Please advise.

## 2018-05-20 NOTE — Telephone Encounter (Signed)
Requested medication (s) are due for refill today: Yes   Requested medication (s) are on the active medication list: Yes  Last refill:  04/16/18  Future visit scheduled: Yes  Notes to clinic:  See request    Requested Prescriptions  Pending Prescriptions Disp Refills   amphetamine-dextroamphetamine (ADDERALL) 10 MG tablet 30 tablet 0    Sig: Take 1 tablet (10 mg total) by mouth daily.     Not Delegated - Psychiatry:  Stimulants/ADHD Failed - 05/20/2018  9:16 AM      Failed - This refill cannot be delegated      Failed - Urine Drug Screen completed in last 360 days.      Passed - Valid encounter within last 3 months    Recent Outpatient Visits          2 months ago Encounter to establish care   [facility], [place], NP   4 months ago Essential hypertension   [facility] [place], MD   10 months ago Chronic right shoulder pain   [facility] at [place], MD   11 months ago Chronic right shoulder pain   [facility] at [place], MD   1 year ago Spinal stenosis of lumbar region with neurogenic claudication   [facility] at [place], MD      Future Appointments            In 1 week Nafziger, Tommi Rumps, NP [facility] at [place], [place]   In 3 months Pension scheme manager, Tommi Rumps, NP [facility] at [place], [facility]

## 2018-06-01 ENCOUNTER — Encounter: Payer: Self-pay | Admitting: Adult Health

## 2018-06-01 ENCOUNTER — Ambulatory Visit (INDEPENDENT_AMBULATORY_CARE_PROVIDER_SITE_OTHER): Payer: Medicare HMO | Admitting: Adult Health

## 2018-06-01 DIAGNOSIS — D492 Neoplasm of unspecified behavior of bone, soft tissue, and skin: Secondary | ICD-10-CM | POA: Diagnosis not present

## 2018-06-01 MED ORDER — DOXYCYCLINE HYCLATE 100 MG PO CAPS: ORAL_CAPSULE | ORAL | 3 refills | Status: DC

## 2018-06-01 NOTE — Progress Notes (Signed)
Subjective:    Patient ID: Jose Evans, male    DOB: 02/19/1946, 73 y.o.   MRN: [id number]  HPI  73 year old male who  has a past medical history of Adult acne, Cancer ([place]) (07/2017), Gout, Hypertension, and Thyroid disease.  He presents to the office today for a lesion on his left temple that has been frozen off before. He would like to have it refrozen  Review of Systems See HPI   Past Medical History:  Diagnosis Date  . Adult acne   . Cancer ([place]) 07/2017   skin cancer  . Gout   . Hypertension   . Thyroid disease     Social History   Socioeconomic History  . Marital status: Married    Spouse name: Not on file  . Number of children: Not on file  . Years of education: Not on file  . Highest education level: Not on file  Occupational History  . Not on file  Social Needs  . Financial resource strain: Not on file  . Food insecurity:    Worry: Not on file    Inability: Not on file  . Transportation needs:    Medical: Not on file    Non-medical: Not on file  Tobacco Use  . Smoking status: Former Smoker    Packs/day: 0.50    Years: 30.00    Pack years: 15.00    Types: Cigarettes    Last attempt to quit: 09/07/2005    Years since quitting: 12.7  . Smokeless tobacco: Never Used  Substance and Sexual Activity  . Alcohol use: Yes    Comment: FEW SHOTS A WEEK  . Drug use: No  . Sexual activity: Not on file  Lifestyle  . Physical activity:    Days per week: Not on file    Minutes per session: Not on file  . Stress: Not on file  Relationships  . Social connections:    Talks on phone: Not on file    Gets together: Not on file    Attends religious service: Not on file    Active member of club or organization: Not on file    Attends meetings of clubs or organizations: Not on file    Relationship status: Not on file  . Intimate partner violence:    Fear of current or ex partner: Not on file    Emotionally abused: Not on file    Physically abused: Not on file      Forced sexual activity: Not on file  Other Topics Concern  . Not on file  Social History Narrative   Works for [employer]    Married   m    Past Surgical History:  Procedure Laterality Date  . HERNIA REPAIR    . SHOULDER SURGERY    . TONSILLECTOMY      Family History  Problem Relation Age of Onset  . Hypertension Other     No Known Allergies  Current Outpatient Medications on File Prior to Visit  Medication Sig Dispense Refill  . allopurinol (ZYLOPRIM) 300 MG tablet Take 1 tablet (300 mg total) by mouth daily. 100 tablet 4  . amphetamine-dextroamphetamine (ADDERALL) 10 MG tablet Take 1 tablet (10 mg total) by mouth daily. 30 tablet 0  . amphetamine-dextroamphetamine (ADDERALL) 10 MG tablet Take 1 tablet (10 mg total) by mouth daily. 30 tablet 0  . amphetamine-dextroamphetamine (ADDERALL) 10 MG tablet Take 1 tablet (10 mg total) by mouth daily. 30 tablet 0  .  diclofenac sodium (VOLTAREN) 1 % GEL APPLY 2-4 GRAMS TO LARGE JOINT AREA UP TO 4 TIMES A DAY 1 Tube 6  . finasteride (PROSCAR) 5 MG tablet One by mouth daily 100 tablet 4  . levothyroxine (SYNTHROID, LEVOTHROID) 150 MCG tablet Take 1 tablet (150 mcg total) by mouth daily. 90 tablet 4  . meloxicam (MOBIC) 7.5 MG tablet Take 1 tablet by mouth daily as needed.  2  . metoprolol tartrate (LOPRESSOR) 50 MG tablet Take 1 tablet (50 mg total) by mouth daily. 90 tablet 3  . neomycin-polymyxin-hydrocortisone (CORTISPORIN) otic solution Uses directed 10 mL 1  . sildenafil (REVATIO) 20 MG tablet TAKE 1 TO 2 TABLETS BY MOUTH 2 TO 3 HRS BEFORE SEX 30 tablet 5  . triamcinolone ointment (KENALOG) 0.5 % Apply small amounts daily 30 g 3  . triamterene-hydrochlorothiazide (MAXZIDE) 75-50 MG tablet Take 1 tablet by mouth daily. 90 tablet 3   No current facility-administered medications on file prior to visit.     BP (!) 152/80   Temp 97.7 F (36.5 C)   Wt 197 lb (89.4 kg)   BMI 29.09 kg/m       Objective:   Physical  Exam Vitals signs and nursing note reviewed.  Constitutional:      Appearance: Normal appearance.  Skin:    General: Skin is warm and dry.     Capillary Refill: Capillary refill takes less than 2 seconds.     Comments: AK on right temple   Neurological:     General: No focal deficit present.     Mental Status: He is alert and oriented to person, place, and time.  Psychiatric:        Mood and Affect: Mood normal.        Behavior: Behavior normal.        Thought Content: Thought content normal.        Judgment: Judgment normal.       Assessment & Plan:  1. Skin neoplasm - Consistent with AK.  - He has a wedding this weekend and would prefer not to have a wound on his face. He would like to come back to have it frozen off.  - Follow up as needed  [name], NP

## 2018-06-15 ENCOUNTER — Ambulatory Visit (INDEPENDENT_AMBULATORY_CARE_PROVIDER_SITE_OTHER): Payer: Medicare HMO | Admitting: Adult Health

## 2018-06-15 ENCOUNTER — Encounter: Payer: Self-pay | Admitting: Adult Health

## 2018-06-15 VITALS — BP 152/86 | Temp 97.8°F | Wt 191.0 lb

## 2018-06-15 DIAGNOSIS — D492 Neoplasm of unspecified behavior of bone, soft tissue, and skin: Secondary | ICD-10-CM

## 2018-06-15 DIAGNOSIS — L570 Actinic keratosis: Secondary | ICD-10-CM

## 2018-06-15 NOTE — Progress Notes (Signed)
Subjective:    Patient ID: Jose Evans, male    DOB: February 12, 1946, 73 y.o.   MRN: [id number]  HPI  73 year old male who presents to the office today for cryotherapy of AK on left temple.   Has no other issues   Review of Systems See HPI   Past Medical History:  Diagnosis Date  . Adult acne   . Cancer ([place]) 07/2017   skin cancer  . Gout   . Hypertension   . Thyroid disease     Social History   Socioeconomic History  . Marital status: Married    Spouse name: Not on file  . Number of children: Not on file  . Years of education: Not on file  . Highest education level: Not on file  Occupational History  . Not on file  Social Needs  . Financial resource strain: Not on file  . Food insecurity:    Worry: Not on file    Inability: Not on file  . Transportation needs:    Medical: Not on file    Non-medical: Not on file  Tobacco Use  . Smoking status: Former Smoker    Packs/day: 0.50    Years: 30.00    Pack years: 15.00    Types: Cigarettes    Last attempt to quit: 09/07/2005    Years since quitting: 12.7  . Smokeless tobacco: Never Used  Substance and Sexual Activity  . Alcohol use: Yes    Comment: FEW SHOTS A WEEK  . Drug use: No  . Sexual activity: Not on file  Lifestyle  . Physical activity:    Days per week: Not on file    Minutes per session: Not on file  . Stress: Not on file  Relationships  . Social connections:    Talks on phone: Not on file    Gets together: Not on file    Attends religious service: Not on file    Active member of club or organization: Not on file    Attends meetings of clubs or organizations: Not on file    Relationship status: Not on file  . Intimate partner violence:    Fear of current or ex partner: Not on file    Emotionally abused: Not on file    Physically abused: Not on file    Forced sexual activity: Not on file  Other Topics Concern  . Not on file  Social History Narrative   Works for [employer]    Married   m    Past Surgical History:  Procedure Laterality Date  . HERNIA REPAIR    . SHOULDER SURGERY    . TONSILLECTOMY      Family History  Problem Relation Age of Onset  . Hypertension Other     No Known Allergies  Current Outpatient Medications on File Prior to Visit  Medication Sig Dispense Refill  . allopurinol (ZYLOPRIM) 300 MG tablet Take 1 tablet (300 mg total) by mouth daily. 100 tablet 4  . amphetamine-dextroamphetamine (ADDERALL) 10 MG tablet Take 1 tablet (10 mg total) by mouth daily. 30 tablet 0  . amphetamine-dextroamphetamine (ADDERALL) 10 MG tablet Take 1 tablet (10 mg total) by mouth daily. 30 tablet 0  . amphetamine-dextroamphetamine (ADDERALL) 10 MG tablet Take 1 tablet (10 mg total) by mouth daily. 30 tablet 0  . diclofenac sodium (VOLTAREN) 1 % GEL APPLY 2-4 GRAMS TO LARGE JOINT AREA UP TO 4 TIMES A DAY 1 Tube 6  . doxycycline (  VIBRAMYCIN) 100 MG capsule TAKE 1 CAPSULE (100 MG TOTAL) BY MOUTH 2 (TWO) TIMES DAILY. 30 capsule 3  . finasteride (PROSCAR) 5 MG tablet One by mouth daily 100 tablet 4  . levothyroxine (SYNTHROID, LEVOTHROID) 150 MCG tablet Take 1 tablet (150 mcg total) by mouth daily. 90 tablet 4  . meloxicam (MOBIC) 7.5 MG tablet Take 1 tablet by mouth daily as needed.  2  . metoprolol tartrate (LOPRESSOR) 50 MG tablet Take 1 tablet (50 mg total) by mouth daily. 90 tablet 3  . neomycin-polymyxin-hydrocortisone (CORTISPORIN) otic solution Uses directed 10 mL 1  . sildenafil (REVATIO) 20 MG tablet TAKE 1 TO 2 TABLETS BY MOUTH 2 TO 3 HRS BEFORE SEX 30 tablet 5  . triamcinolone ointment (KENALOG) 0.5 % Apply small amounts daily 30 g 3  . triamterene-hydrochlorothiazide (MAXZIDE) 75-50 MG tablet Take 1 tablet by mouth daily. 90 tablet 3   No current facility-administered medications on file prior to visit.     BP (!) 152/86   Temp 97.8 F (36.6 C)   Wt 191 lb (86.6 kg)   BMI 28.21 kg/m       Objective:   Physical Exam Constitutional:       Appearance: Normal appearance.  Skin:    General: Skin is warm and dry.     Capillary Refill: Capillary refill takes less than 2 seconds.     Comments: Small AK noted on right temple   Neurological:     General: No focal deficit present.     Mental Status: He is alert and oriented to person, place, and time.        Assessment & Plan:  1. Skin neoplasm - Informed consent reviewed. Cryotherapy was used to destroy AK on right temple. Patient tolerated procedure well  [name], NP

## 2018-08-20 ENCOUNTER — Telehealth: Payer: Self-pay | Admitting: Adult Health

## 2018-08-20 ENCOUNTER — Ambulatory Visit (INDEPENDENT_AMBULATORY_CARE_PROVIDER_SITE_OTHER): Payer: Medicare HMO | Admitting: Adult Health

## 2018-08-20 ENCOUNTER — Encounter: Payer: Self-pay | Admitting: Adult Health

## 2018-08-20 ENCOUNTER — Other Ambulatory Visit: Payer: Self-pay

## 2018-08-20 DIAGNOSIS — F988 Other specified behavioral and emotional disorders with onset usually occurring in childhood and adolescence: Secondary | ICD-10-CM | POA: Diagnosis not present

## 2018-08-20 DIAGNOSIS — R69 Illness, unspecified: Secondary | ICD-10-CM | POA: Diagnosis not present

## 2018-08-20 MED ORDER — AMPHETAMINE-DEXTROAMPHETAMINE 10 MG PO TABS: 10.0000 mg | ORAL_TABLET | Freq: Every day | ORAL | 0 refills | Status: DC

## 2018-08-20 NOTE — Progress Notes (Addendum)
Virtual Visit via Video Note  I connected with Cristela Blue on 08/20/18 at  2:00 PM EDT by a video enabled telemedicine application and verified that I am speaking with the correct person using two identifiers.  Location patient: home Location provider:work or home office Persons participating in the virtual visit: patient, provider  I discussed the limitations of evaluation and management by telemedicine and the availability of in person appointments. The patient expressed understanding and agreed to proceed.   HPI: 73 year old male who is being evaluated today for 6 follow-up regarding ADHD.  He is currently prescribed Adderall 10 mg daily.  He feels well controlled on this dose.  He denies any side effects such as weight loss, insomnia, or palpitations.   ROS: See pertinent positives and negatives per HPI.  Past Medical History:  Diagnosis Date  . Adult acne   . Cancer ([place]) 07/2017   skin cancer  . Gout   . Hypertension   . Thyroid disease     Past Surgical History:  Procedure Laterality Date  . HERNIA REPAIR    . SHOULDER SURGERY    . TONSILLECTOMY      Family History  Problem Relation Age of Onset  . Hypertension Other      Current Outpatient Medications:  .  allopurinol (ZYLOPRIM) 300 MG tablet, Take 1 tablet (300 mg total) by mouth daily., Disp: 100 tablet, Rfl: 4 .  amphetamine-dextroamphetamine (ADDERALL) 10 MG tablet, Take 1 tablet (10 mg total) by mouth daily., Disp: 30 tablet, Rfl: 0 .  amphetamine-dextroamphetamine (ADDERALL) 10 MG tablet, Take 1 tablet (10 mg total) by mouth daily., Disp: 30 tablet, Rfl: 0 .  amphetamine-dextroamphetamine (ADDERALL) 10 MG tablet, Take 1 tablet (10 mg total) by mouth daily., Disp: 30 tablet, Rfl: 0 .  diclofenac sodium (VOLTAREN) 1 % GEL, APPLY 2-4 GRAMS TO LARGE JOINT AREA UP TO 4 TIMES A DAY, Disp: 1 Tube, Rfl: 6 .  doxycycline (VIBRAMYCIN) 100 MG capsule, TAKE 1 CAPSULE (100 MG TOTAL) BY MOUTH 2 (TWO) TIMES DAILY., Disp:  30 capsule, Rfl: 3 .  finasteride (PROSCAR) 5 MG tablet, One by mouth daily, Disp: 100 tablet, Rfl: 4 .  levothyroxine (SYNTHROID, LEVOTHROID) 150 MCG tablet, Take 1 tablet (150 mcg total) by mouth daily., Disp: 90 tablet, Rfl: 4 .  meloxicam (MOBIC) 7.5 MG tablet, Take 1 tablet by mouth daily as needed., Disp: , Rfl: 2 .  metoprolol tartrate (LOPRESSOR) 50 MG tablet, Take 1 tablet (50 mg total) by mouth daily., Disp: 90 tablet, Rfl: 3 .  neomycin-polymyxin-hydrocortisone (CORTISPORIN) otic solution, Uses directed, Disp: 10 mL, Rfl: 1 .  sildenafil (REVATIO) 20 MG tablet, TAKE 1 TO 2 TABLETS BY MOUTH 2 TO 3 HRS BEFORE SEX, Disp: 30 tablet, Rfl: 5 .  triamcinolone ointment (KENALOG) 0.5 %, Apply small amounts daily, Disp: 30 g, Rfl: 3 .  triamterene-hydrochlorothiazide (MAXZIDE) 75-50 MG tablet, Take 1 tablet by mouth daily., Disp: 90 tablet, Rfl: 3  EXAM:  VITALS per patient if applicable:  GENERAL: alert, oriented, appears well and in no acute distress  HEENT: atraumatic, conjunttiva clear, no obvious abnormalities on inspection of external nose and ears  NECK: normal movements of the head and neck  LUNGS: on inspection no signs of respiratory distress, breathing rate appears normal, no obvious gross SOB, gasping or wheezing  CV: no obvious cyanosis  MS: moves all visible extremities without noticeable abnormality  PSYCH/NEURO: pleasant and cooperative, no obvious depression or anxiety, speech and thought processing grossly intact  ASSESSMENT AND PLAN:  1. Attention deficit disorder (ADD) without hyperactivity - amphetamine-dextroamphetamine (ADDERALL) 10 MG tablet; Take 1 tablet (10 mg total) by mouth daily.  Dispense: 30 tablet; Refill: 0 - amphetamine-dextroamphetamine (ADDERALL) 10 MG tablet; Take 1 tablet (10 mg total) by mouth daily.  Dispense: 30 tablet; Refill: 0 - amphetamine-dextroamphetamine (ADDERALL) 10 MG tablet; Take 1 tablet (10 mg total) by mouth daily.  Dispense:  30 tablet; Refill: 0  -Reviewed controlled substance database.  No discrepancies or red flags noted.  -Follow-up in 6 months   I discussed the assessment and treatment plan with the patient. The patient was provided an opportunity to ask questions and all were answered. The patient agreed with the plan and demonstrated an understanding of the instructions.   The patient was advised to call back or seek an in-person evaluation if the symptoms worsen or if the condition fails to improve as anticipated.   [name], NP

## 2018-08-20 NOTE — Telephone Encounter (Signed)
Pt scheduled to see Tommi Rumps today for 6 month follow up

## 2018-08-20 NOTE — Telephone Encounter (Unsigned)
Copied from [place] [id number]. Topic: Quick Communication - Rx Refill/Question >> Aug 20, 2018 11:30 AM Yvette Rack wrote: Medication: amphetamine-dextroamphetamine (ADDERALL) 10 MG tablet  Has the patient contacted their pharmacy? no  Preferred Pharmacy (with phone number or street name): [id number] [address], [place] [phone] (Phone) [phone] (Fax)  Agent: Please be advised that RX refills may take up to 3 business days. We ask that you follow-up with your pharmacy.

## 2018-09-03 ENCOUNTER — Ambulatory Visit: Payer: Medicare HMO | Admitting: Adult Health

## 2018-10-13 ENCOUNTER — Telehealth: Payer: Self-pay

## 2018-10-13 NOTE — Telephone Encounter (Signed)
PA for sildenafil (REVATIO) 20 MG tablet has been sent to cover my meds.   Claiborne Billings Key: [id number] - PA Case ID: [id number] - Rx #: [id number]

## 2018-10-15 NOTE — Telephone Encounter (Signed)
PA has been denied. Denial states that this medication does not meet the definition of medical necessity in the members benefit booklet.

## 2018-11-16 ENCOUNTER — Telehealth: Payer: Self-pay | Admitting: Adult Health

## 2018-11-16 DIAGNOSIS — F988 Other specified behavioral and emotional disorders with onset usually occurring in childhood and adolescence: Secondary | ICD-10-CM

## 2018-11-16 MED ORDER — AMPHETAMINE-DEXTROAMPHETAMINE 10 MG PO TABS: 10.0000 mg | ORAL_TABLET | Freq: Every day | ORAL | 0 refills | Status: DC

## 2018-11-16 NOTE — Telephone Encounter (Signed)
Medication Refill - Medication:  amphetamine-dextroamphetamine (ADDERALL) 10 MG tablet  Has the patient contacted their pharmacy? Yes advised to call PCP. Please send 90 day supply in 3 separate scripts.   Preferred Pharmacy (with phone number or street name):  [id number] [address], [place] [phone] (Phone) [phone] (Fax)   Agent: Please be advised that RX refills may take up to 3 business days. We ask that you follow-up with your pharmacy.

## 2018-12-09 ENCOUNTER — Ambulatory Visit (INDEPENDENT_AMBULATORY_CARE_PROVIDER_SITE_OTHER): Payer: Medicare HMO | Admitting: Adult Health

## 2018-12-09 ENCOUNTER — Other Ambulatory Visit: Payer: Self-pay

## 2018-12-09 ENCOUNTER — Encounter: Payer: Self-pay | Admitting: Adult Health

## 2018-12-09 VITALS — BP 128/84 | Temp 97.2°F | Wt 191.0 lb

## 2018-12-09 DIAGNOSIS — D492 Neoplasm of unspecified behavior of bone, soft tissue, and skin: Secondary | ICD-10-CM

## 2018-12-09 DIAGNOSIS — M79672 Pain in left foot: Secondary | ICD-10-CM

## 2018-12-09 DIAGNOSIS — L570 Actinic keratosis: Secondary | ICD-10-CM

## 2018-12-09 DIAGNOSIS — Z23 Encounter for immunization: Secondary | ICD-10-CM | POA: Diagnosis not present

## 2018-12-09 DIAGNOSIS — M79671 Pain in right foot: Secondary | ICD-10-CM | POA: Diagnosis not present

## 2018-12-09 MED ORDER — DOXYCYCLINE HYCLATE 100 MG PO CAPS: ORAL_CAPSULE | ORAL | 3 refills | Status: DC

## 2018-12-09 MED ORDER — TRIAMCINOLONE ACETONIDE 0.1 % EX CREA: 1.0000 "application " | TOPICAL_CREAM | Freq: Two times a day (BID) | CUTANEOUS | 0 refills | Status: DC

## 2018-12-09 NOTE — Progress Notes (Signed)
Subjective:    Patient ID: Jose Evans, male    DOB: 09/26/1945, 73 y.o.   MRN: [id number]  HPI 73 year old male who  has a past medical history of Adult acne, Cancer ([place]) (07/2017), Gout, Hypertension, and Thyroid disease.  He presents to the office today for cryotherapy of AK on right temple and right forearm.   He would also like to be referred to podiatry for chronic planter foot pain.He has had this chronic pain for multiple years. Is pretty constant when he is not wearing his tennis shoes.   Needs refill of doxycycline and kenalog cream   Review of Systems See HPI   Past Medical History:  Diagnosis Date  . Adult acne   . Cancer ([place]) 07/2017   skin cancer  . Gout   . Hypertension   . Thyroid disease     Social History   Socioeconomic History  . Marital status: Married    Spouse name: Not on file  . Number of children: Not on file  . Years of education: Not on file  . Highest education level: Not on file  Occupational History  . Not on file  Social Needs  . Financial resource strain: Not on file  . Food insecurity    Worry: Not on file    Inability: Not on file  . Transportation needs    Medical: Not on file    Non-medical: Not on file  Tobacco Use  . Smoking status: Former Smoker    Packs/day: 0.50    Years: 30.00    Pack years: 15.00    Types: Cigarettes    Quit date: 09/07/2005    Years since quitting: 13.2  . Smokeless tobacco: Never Used  Substance and Sexual Activity  . Alcohol use: Yes    Comment: FEW SHOTS A WEEK  . Drug use: No  . Sexual activity: Not on file  Lifestyle  . Physical activity    Days per week: Not on file    Minutes per session: Not on file  . Stress: Not on file  Relationships  . Social Herbalist on phone: Not on file    Gets together: Not on file    Attends religious service: Not on file    Active member of club or organization: Not on file    Attends meetings of clubs or organizations: Not on file   Relationship status: Not on file  . Intimate partner violence    Fear of current or ex partner: Not on file    Emotionally abused: Not on file    Physically abused: Not on file    Forced sexual activity: Not on file  Other Topics Concern  . Not on file  Social History Narrative   Works for [employer]    Married   m    Past Surgical History:  Procedure Laterality Date  . HERNIA REPAIR    . SHOULDER SURGERY    . TONSILLECTOMY      Family History  Problem Relation Age of Onset  . Hypertension Other     No Known Allergies  Current Outpatient Medications on File Prior to Visit  Medication Sig Dispense Refill  . allopurinol (ZYLOPRIM) 300 MG tablet Take 1 tablet (300 mg total) by mouth daily. 100 tablet 4  . amphetamine-dextroamphetamine (ADDERALL) 10 MG tablet Take 1 tablet (10 mg total) by mouth daily. 30 tablet 0  . amphetamine-dextroamphetamine (ADDERALL) 10 MG tablet Take 1  tablet (10 mg total) by mouth daily. 30 tablet 0  . amphetamine-dextroamphetamine (ADDERALL) 10 MG tablet Take 1 tablet (10 mg total) by mouth daily. 30 tablet 0  . doxycycline (VIBRAMYCIN) 100 MG capsule TAKE 1 CAPSULE (100 MG TOTAL) BY MOUTH 2 (TWO) TIMES DAILY. 30 capsule 3  . finasteride (PROSCAR) 5 MG tablet One by mouth daily 100 tablet 4  . levothyroxine (SYNTHROID, LEVOTHROID) 150 MCG tablet Take 1 tablet (150 mcg total) by mouth daily. 90 tablet 4  . meloxicam (MOBIC) 7.5 MG tablet Take 1 tablet by mouth daily as needed.  2  . metoprolol tartrate (LOPRESSOR) 50 MG tablet Take 1 tablet (50 mg total) by mouth daily. 90 tablet 3  . sildenafil (REVATIO) 20 MG tablet TAKE 1 TO 2 TABLETS BY MOUTH 2 TO 3 HRS BEFORE SEX 30 tablet 5  . triamterene-hydrochlorothiazide (MAXZIDE) 75-50 MG tablet Take 1 tablet by mouth daily. 90 tablet 3   No current facility-administered medications on file prior to visit.     BP 136/84   Temp (!) 97.2 F (36.2 C) (Temporal)   Wt 191 lb (86.6 kg)   BMI 28.21  kg/m       Objective:   Physical Exam Vitals signs reviewed.  Constitutional:      Appearance: Normal appearance.  Skin:    General: Skin is warm and dry.     Capillary Refill: Capillary refill takes less than 2 seconds.     Comments: Small 3 mm AK on right temple and 5 mm AK on right forearm   Neurological:     General: No focal deficit present.     Mental Status: He is alert and oriented to person, place, and time. Mental status is at baseline.  Psychiatric:        Mood and Affect: Mood normal.        Behavior: Behavior normal.        Thought Content: Thought content normal.        Judgment: Judgment normal.       Assessment & Plan:  1. Skin neoplasm - Informed consent obtained. Cryotherapy was used to destroy AK on right temple and right forearm. Patient tolerated procedure well. After care instructions reviewed.   - triamcinolone cream (KENALOG) 0.1 %; Apply 1 application topically 2 (two) times daily.  Dispense: 30 g; Refill: 0 - doxycycline (VIBRAMYCIN) 100 MG capsule; TAKE 1 CAPSULE (100 MG TOTAL) BY MOUTH 2 (TWO) TIMES DAILY.  Dispense: 30 capsule; Refill: 3  2. Bilateral foot pain  - Ambulatory referral to [place], NP

## 2018-12-09 NOTE — Patient Instructions (Signed)
It was great seeing you today   Please stop at the front desk and schedule your physical exam   I have sent in medication and placed a referral to podiatry

## 2018-12-09 NOTE — Addendum Note (Signed)
Addended by: Miles Costain T on: 12/09/2018 08:15 AM   Modules accepted: Orders

## 2018-12-10 ENCOUNTER — Encounter: Payer: Self-pay | Admitting: Adult Health

## 2018-12-17 ENCOUNTER — Other Ambulatory Visit: Payer: Self-pay

## 2018-12-17 ENCOUNTER — Encounter: Payer: Self-pay | Admitting: Podiatry

## 2018-12-17 ENCOUNTER — Ambulatory Visit (INDEPENDENT_AMBULATORY_CARE_PROVIDER_SITE_OTHER): Payer: Medicare HMO | Admitting: Podiatry

## 2018-12-17 ENCOUNTER — Ambulatory Visit (INDEPENDENT_AMBULATORY_CARE_PROVIDER_SITE_OTHER): Payer: Medicare HMO

## 2018-12-17 ENCOUNTER — Other Ambulatory Visit: Payer: Self-pay | Admitting: Podiatry

## 2018-12-17 VITALS — BP 144/86 | HR 50 | Resp 16

## 2018-12-17 DIAGNOSIS — G629 Polyneuropathy, unspecified: Secondary | ICD-10-CM

## 2018-12-17 DIAGNOSIS — M7752 Other enthesopathy of left foot: Secondary | ICD-10-CM

## 2018-12-17 DIAGNOSIS — M7751 Other enthesopathy of right foot: Secondary | ICD-10-CM | POA: Diagnosis not present

## 2018-12-17 DIAGNOSIS — M79671 Pain in right foot: Secondary | ICD-10-CM

## 2018-12-17 DIAGNOSIS — M79672 Pain in left foot: Secondary | ICD-10-CM

## 2018-12-17 DIAGNOSIS — M779 Enthesopathy, unspecified: Secondary | ICD-10-CM

## 2018-12-17 MED ORDER — GABAPENTIN 300 MG PO CAPS: 300.0000 mg | ORAL_CAPSULE | Freq: Three times a day (TID) | ORAL | 3 refills | Status: DC

## 2018-12-17 NOTE — Progress Notes (Signed)
Subjective:   Patient ID: Jose Evans, male   DOB: 73 y.o.   MRN: [id number]   HPI Patient presents stating that he just has problems with his feet in general has high arches in his feet feel uncomfortable.  He also gets a lot of burning tingling pain which seems to be worse at night and patient does not smoke and would like to be more active   Review of Systems  All other systems reviewed and are negative.       Objective:  Physical Exam Vitals signs and nursing note reviewed.  Constitutional:      Appearance: He is well-developed.  Pulmonary:     Effort: Pulmonary effort is normal.  Musculoskeletal: Normal range of motion.  Skin:    General: Skin is warm.  Neurological:     Mental Status: He is alert.   Patient has relative high arch has rigid contracted lesser digits and mild diminishment sharp dull vibratory.  Patient also has equinus but does have good digital perfusion is found to be well oriented x3 with pain in the metatarsals bilateral with moderate keratotic lesion sub-1 sub-5 both feet     Assessment:  Discomfort which may be neuropathic or may be related to inflammatory capsulitis digital deformities creating pressure against the metatarsals     Plan:  H&P all conditions reviewed and I have recommended a super soft orthotic to try to reduce pressure along with thick metatarsal padding to try to take pressure off the metatarsal heads.  I have referred him to ped orthotist for the best type of orthotics for this condition but I do not think he will be able to tolerate anything hard and I also placed him on gabapentin and I want him to take 1 pill at night 300 mg before bed  X-ray indicates there is high arch foot structure bilateral but the digits did seem to be in good alignment

## 2018-12-17 NOTE — Patient Instructions (Signed)
Hammer Toe  Hammer toe is a change in the shape (a deformity) of your toe. The deformity causes the middle joint of your toe to stay bent. This causes pain, especially when you are wearing shoes. Hammer toe starts gradually. At first, the toe can be straightened. Gradually over time, the deformity becomes stiff and permanent. Early treatments to keep the toe straight may relieve pain. As the deformity becomes stiff and permanent, surgery may be needed to straighten the toe. What are the causes? Hammer toe is caused by abnormal bending of the toe joint that is closest to your foot. It happens gradually over time. This pulls on the muscles and connections (tendons) of the toe joint, making them weak and stiff. It is often related to wearing shoes that are too short or narrow and do not let your toes straighten. What increases the risk? You may be at greater risk for hammer toe if you:  Are male.  Are older.  Wear shoes that are too small.  Wear high-heeled shoes that pinch your toes.  Are a ballet dancer.  Have a second toe that is longer than your big toe (first toe).  Injure your foot or toe.  Have arthritis.  Have a family history of hammer toe.  Have a nerve or muscle disorder. What are the signs or symptoms? The main symptoms of this condition are pain and deformity of the toe. The pain is worse when wearing shoes, walking, or running. Other symptoms may include:  Corns or calluses over the bent part of the toe or between the toes.  Redness and a burning feeling on the toe.  An open sore that forms on the top of the toe.  Not being able to straighten the toe. How is this diagnosed? This condition is diagnosed based on your symptoms and a physical exam. During the exam, your health care provider will try to straighten your toe to see how stiff the deformity is. You may also have tests, such as:  A blood test to check for rheumatoid arthritis.  An X-ray to show how  severe the deformity is. How is this treated? Treatment for this condition will depend on how stiff the deformity is. Surgery is often needed. However, sometimes a hammer toe can be straightened without surgery. Treatments that do not involve surgery include:  Taping the toe into a straightened position.  Using pads and cushions to protect the toe (orthotics).  Wearing shoes that provide enough room for the toes.  Doing toe-stretching exercises at home.  Taking an NSAID to reduce pain and swelling. If these treatments do not help or the toe cannot be straightened, surgery is the next option. The most common surgeries used to straighten a hammer toe include:  Arthroplasty. In this procedure, part of the joint is removed, and that allows the toe to straighten.  Fusion. In this procedure, cartilage between the two bones of the joint is taken out and the bones are fused together into one longer bone.  Implantation. In this procedure, part of the bone is removed and replaced with an implant to let the toe move again.  Flexor tendon transfer. In this procedure, the tendons that curl the toes down (flexor tendons) are repositioned. Follow these instructions at home:  Take over-the-counter and prescription medicines only as told by your health care provider.  Do toe straightening and stretching exercises as told by your health care provider.  Keep all follow-up visits as told by your health care   provider. This is important. How is this prevented?  Wear shoes that give your toes enough room and do not cause pain.  Do not wear high-heeled shoes. Contact a health care provider if:  Your pain gets worse.  Your toe becomes red or swollen.  You develop an open sore on your toe. This information is not intended to replace advice given to you by your health care provider. Make sure you discuss any questions you have with your health care provider. Document Released: 04/04/2000 Document  Revised: 03/20/2017 Document Reviewed: 08/01/2015 Elsevier Patient Education  2020 [employer]  

## 2018-12-17 NOTE — Progress Notes (Signed)
   Subjective:    Patient ID: Jose Evans, male    DOB: 12-Feb-1946, 73 y.o.   MRN: [id number]  HPI    Review of Systems  All other systems reviewed and are negative.      Objective:   Physical Exam        Assessment & Plan:

## 2018-12-21 ENCOUNTER — Encounter: Payer: Self-pay | Admitting: Adult Health

## 2018-12-21 ENCOUNTER — Telehealth: Payer: Self-pay | Admitting: Adult Health

## 2018-12-21 DIAGNOSIS — F988 Other specified behavioral and emotional disorders with onset usually occurring in childhood and adolescence: Secondary | ICD-10-CM

## 2018-12-21 MED ORDER — AMPHETAMINE-DEXTROAMPHETAMINE 10 MG PO TABS: 10.0000 mg | ORAL_TABLET | Freq: Every day | ORAL | 0 refills | Status: DC

## 2018-12-21 NOTE — Telephone Encounter (Signed)
Called and spoke to the pharmacy.  All three prescriptions say the earliest fill date is 11/16/2018.  Pt had rx filled on 11/20/2018.  Needs 2 prescriptions.  The first should have the earliest fill date of 12/21/2018 and the next should say 01/20/2019.

## 2018-12-21 NOTE — Addendum Note (Signed)
Addended by: Apolinar Junes on: 12/21/2018 04:14 PM   Modules accepted: Orders

## 2018-12-21 NOTE — Telephone Encounter (Signed)
Pt called and stated that he can not pick up RX for ADDERALL Because rx is dated for October and not September according to the pharmacy. Pt would like a call back regarding. Pt states that he is completely out of medication. Please advise

## 2018-12-21 NOTE — Addendum Note (Signed)
Addended by: Miles Costain T on: 12/21/2018 04:02 PM   Modules accepted: Orders

## 2018-12-29 ENCOUNTER — Other Ambulatory Visit: Payer: Self-pay | Admitting: Family Medicine

## 2018-12-29 MED ORDER — TRIAMTERENE-HCTZ 75-50 MG PO TABS: 1.0000 | ORAL_TABLET | Freq: Every day | ORAL | 0 refills | Status: DC

## 2018-12-29 MED ORDER — METOPROLOL TARTRATE 50 MG PO TABS: 50.0000 mg | ORAL_TABLET | Freq: Every day | ORAL | 0 refills | Status: DC

## 2018-12-29 NOTE — Telephone Encounter (Signed)
Sent to the pharmacy by e-scribe. 

## 2018-12-30 ENCOUNTER — Other Ambulatory Visit: Payer: Self-pay

## 2018-12-30 ENCOUNTER — Ambulatory Visit (INDEPENDENT_AMBULATORY_CARE_PROVIDER_SITE_OTHER): Payer: Medicare HMO | Admitting: Orthotics

## 2018-12-30 DIAGNOSIS — M779 Enthesopathy, unspecified: Secondary | ICD-10-CM

## 2018-12-30 DIAGNOSIS — G629 Polyneuropathy, unspecified: Secondary | ICD-10-CM

## 2018-12-30 DIAGNOSIS — G8929 Other chronic pain: Secondary | ICD-10-CM

## 2018-12-30 NOTE — Progress Notes (Signed)
Patient wants to try POWERSTEPS first

## 2019-01-25 ENCOUNTER — Other Ambulatory Visit: Payer: Self-pay | Admitting: Adult Health

## 2019-01-25 ENCOUNTER — Ambulatory Visit (INDEPENDENT_AMBULATORY_CARE_PROVIDER_SITE_OTHER): Payer: Medicare HMO | Admitting: Adult Health

## 2019-01-25 ENCOUNTER — Other Ambulatory Visit: Payer: Self-pay

## 2019-01-25 ENCOUNTER — Encounter: Payer: Self-pay | Admitting: Adult Health

## 2019-01-25 VITALS — BP 140/80 | Temp 97.6°F | Ht 69.0 in | Wt 193.0 lb

## 2019-01-25 DIAGNOSIS — E039 Hypothyroidism, unspecified: Secondary | ICD-10-CM | POA: Diagnosis not present

## 2019-01-25 DIAGNOSIS — F988 Other specified behavioral and emotional disorders with onset usually occurring in childhood and adolescence: Secondary | ICD-10-CM | POA: Diagnosis not present

## 2019-01-25 DIAGNOSIS — R7989 Other specified abnormal findings of blood chemistry: Secondary | ICD-10-CM

## 2019-01-25 DIAGNOSIS — R7309 Other abnormal glucose: Secondary | ICD-10-CM

## 2019-01-25 DIAGNOSIS — N401 Enlarged prostate with lower urinary tract symptoms: Secondary | ICD-10-CM

## 2019-01-25 DIAGNOSIS — Z0000 Encounter for general adult medical examination without abnormal findings: Secondary | ICD-10-CM

## 2019-01-25 DIAGNOSIS — I10 Essential (primary) hypertension: Secondary | ICD-10-CM | POA: Diagnosis not present

## 2019-01-25 DIAGNOSIS — M10079 Idiopathic gout, unspecified ankle and foot: Secondary | ICD-10-CM | POA: Diagnosis not present

## 2019-01-25 DIAGNOSIS — R69 Illness, unspecified: Secondary | ICD-10-CM | POA: Diagnosis not present

## 2019-01-25 LAB — CBC WITH DIFFERENTIAL/PLATELET
Basophils Absolute: 0.1 10*3/uL (ref 0.0–0.1)
Basophils Relative: 1.2 % (ref 0.0–3.0)
Eosinophils Absolute: 0.1 10*3/uL (ref 0.0–0.7)
Eosinophils Relative: 2.3 % (ref 0.0–5.0)
HCT: 45.8 % (ref 39.0–52.0)
Hemoglobin: 15.7 g/dL (ref 13.0–17.0)
Lymphocytes Relative: 21.2 % (ref 12.0–46.0)
Lymphs Abs: 1.3 10*3/uL (ref 0.7–4.0)
MCHC: 34.2 g/dL (ref 30.0–36.0)
MCV: 92.3 fl (ref 78.0–100.0)
Monocytes Absolute: 0.4 10*3/uL (ref 0.1–1.0)
Monocytes Relative: 6.4 % (ref 3.0–12.0)
Neutro Abs: 4.2 10*3/uL (ref 1.4–7.7)
Neutrophils Relative %: 68.9 % (ref 43.0–77.0)
Platelets: 220 10*3/uL (ref 150.0–400.0)
RBC: 4.96 Mil/uL (ref 4.22–5.81)
RDW: 14 % (ref 11.5–15.5)
WBC: 6.1 10*3/uL (ref 4.0–10.5)

## 2019-01-25 LAB — LIPID PANEL
Cholesterol: 178 mg/dL (ref 0–200)
HDL: 38.3 mg/dL — ABNORMAL LOW (ref >39.00)
LDL Cholesterol: 111 mg/dL — ABNORMAL HIGH (ref 0–99)
NonHDL: 139.24
Total CHOL/HDL Ratio: 5
Triglycerides: 142 mg/dL (ref 0.0–149.0)
VLDL: 28.4 mg/dL (ref 0.0–40.0)

## 2019-01-25 LAB — PSA: PSA: 1.39 ng/mL (ref 0.10–4.00)

## 2019-01-25 LAB — TSH: TSH: 8.05 u[IU]/mL — ABNORMAL HIGH (ref 0.35–4.50)

## 2019-01-25 LAB — COMPREHENSIVE METABOLIC PANEL
ALT: 23 U/L (ref 0–53)
AST: 23 U/L (ref 0–37)
Albumin: 4.9 g/dL (ref 3.5–5.2)
Alkaline Phosphatase: 57 U/L (ref 39–117)
BUN: 24 mg/dL — ABNORMAL HIGH (ref 6–23)
CO2: 29 mEq/L (ref 19–32)
Calcium: 10 mg/dL (ref 8.4–10.5)
Chloride: 100 mEq/L (ref 96–112)
Creatinine, Ser: 1 mg/dL (ref 0.40–1.50)
GFR: 73.6 mL/min (ref >60.00)
Glucose, Bld: 124 mg/dL — ABNORMAL HIGH (ref 70–99)
Potassium: 4.1 mEq/L (ref 3.5–5.1)
Sodium: 138 mEq/L (ref 135–145)
Total Bilirubin: 0.9 mg/dL (ref 0.2–1.2)
Total Protein: 7 g/dL (ref 6.0–8.3)

## 2019-01-25 MED ORDER — LEVOTHYROXINE SODIUM 150 MCG PO TABS: 150.0000 ug | ORAL_TABLET | Freq: Every day | ORAL | 3 refills | Status: DC

## 2019-01-25 MED ORDER — FINASTERIDE 5 MG PO TABS: ORAL_TABLET | ORAL | 3 refills | Status: DC

## 2019-01-25 MED ORDER — METOPROLOL TARTRATE 50 MG PO TABS: 50.0000 mg | ORAL_TABLET | Freq: Every day | ORAL | 3 refills | Status: DC

## 2019-01-25 MED ORDER — ALLOPURINOL 300 MG PO TABS: 300.0000 mg | ORAL_TABLET | Freq: Every day | ORAL | 3 refills | Status: DC

## 2019-01-25 NOTE — Progress Notes (Signed)
Subjective:    Patient ID: Jose Evans, male    DOB: [birth year], 73 y.o.   MRN: [id number]  HPI Patient presents for yearly preventative medicine examination. He is a pleasant 73 year old male who  has a past medical history of Adult acne, Cancer ([place]) (07/2017), Gout, Hypertension, and Thyroid disease.  H/o Gout - takes allopurinol 300 mg daily.  Denies any recent gout flares  H/o adult ADD-he takes Adderall 10 mg daily. he feels as though this dose is adequate for him  Hypothyroidism-well controlled on Synthroid 150 mcg daily  Essential hypertension-takes Lopressor 50 mg daily and Maxide 75-50 mg.  He denies dizziness, lightheadedness, chest pain, shortness of breath. He has not been checking his blood pressure at home.   BP Readings from Last 3 Encounters:  01/25/19 140/80  12/17/18 (!) 144/86  12/09/18 128/84   BPH-well-controlled with Proscar 5 mg daily  All immunizations and health maintenance protocols were reviewed with the patient and needed orders were placed. He is up to date on vaccinations.   Appropriate screening laboratory values were ordered for the patient including screening of hyperlipidemia, renal function and hepatic function. If indicated by BPH, a PSA was ordered.  Medication reconciliation,  past medical history, social history, problem list and allergies were reviewed in detail with the patient  Goals were established with regard to weight loss, exercise, and  diet in compliance with medications. He does not exercise on a routine basis but stays active. He eats out quite often.   Wt Readings from Last 3 Encounters:  01/25/19 193 lb (87.5 kg)  12/09/18 191 lb (86.6 kg)  06/15/18 191 lb (86.6 kg)   End of life planning was discussed.  Participates in routine dental and vision screens.  In the past he has refused Cologuard and colonoscopy and continues to refuse  Review of Systems  Constitutional: Negative.   HENT: Negative.   Eyes: Negative.    Respiratory: Negative.   Cardiovascular: Negative.   Gastrointestinal: Negative.   Endocrine: Negative.   Genitourinary: Negative.   Musculoskeletal: Positive for arthralgias.  Skin: Negative.   Allergic/Immunologic: Negative.   Neurological: Negative.   Hematological: Negative.   Psychiatric/Behavioral: Negative.   All other systems reviewed and are negative.  Past Medical History:  Diagnosis Date  . Adult acne   . Cancer ([place]) 07/2017   skin cancer  . Gout   . Hypertension   . Thyroid disease     Social History   Socioeconomic History  . Marital status: Married    Spouse name: Not on file  . Number of children: Not on file  . Years of education: Not on file  . Highest education level: Not on file  Occupational History  . Not on file  Social Needs  . Financial resource strain: Not on file  . Food insecurity    Worry: Not on file    Inability: Not on file  . Transportation needs    Medical: Not on file    Non-medical: Not on file  Tobacco Use  . Smoking status: Former Smoker    Packs/day: 0.50    Years: 30.00    Pack years: 15.00    Types: Cigarettes    Quit date: 09/07/2005    Years since quitting: 13.3  . Smokeless tobacco: Never Used  Substance and Sexual Activity  . Alcohol use: Yes    Comment: FEW SHOTS A WEEK  . Drug use: No  . Sexual activity: Not on file  Lifestyle  . Physical activity    Days per week: Not on file    Minutes per session: Not on file  . Stress: Not on file  Relationships  . Social Herbalist on phone: Not on file    Gets together: Not on file    Attends religious service: Not on file    Active member of club or organization: Not on file    Attends meetings of clubs or organizations: Not on file    Relationship status: Not on file  . Intimate partner violence    Fear of current or ex partner: Not on file    Emotionally abused: Not on file    Physically abused: Not on file    Forced sexual activity: Not on file   Other Topics Concern  . Not on file  Social History Narrative   Works for [employer]    Married   m    Past Surgical History:  Procedure Laterality Date  . HERNIA REPAIR    . SHOULDER SURGERY    . TONSILLECTOMY      Family History  Problem Relation Age of Onset  . Hypertension Other     No Known Allergies  Current Outpatient Medications on File Prior to Visit  Medication Sig Dispense Refill  . allopurinol (ZYLOPRIM) 300 MG tablet Take 1 tablet (300 mg total) by mouth daily. 100 tablet 4  . amphetamine-dextroamphetamine (ADDERALL) 10 MG tablet Take 1 tablet (10 mg total) by mouth daily. 30 tablet 0  . amphetamine-dextroamphetamine (ADDERALL) 10 MG tablet Take 1 tablet (10 mg total) by mouth daily. 30 tablet 0  . amphetamine-dextroamphetamine (ADDERALL) 10 MG tablet Take 1 tablet (10 mg total) by mouth daily. 30 tablet 0  . doxycycline (VIBRAMYCIN) 100 MG capsule TAKE 1 CAPSULE (100 MG TOTAL) BY MOUTH 2 (TWO) TIMES DAILY. 30 capsule 3  . finasteride (PROSCAR) 5 MG tablet One by mouth daily 100 tablet 4  . levothyroxine (SYNTHROID, LEVOTHROID) 150 MCG tablet Take 1 tablet (150 mcg total) by mouth daily. 90 tablet 4  . meloxicam (MOBIC) 7.5 MG tablet Take 1 tablet by mouth daily as needed.  2  . metoprolol tartrate (LOPRESSOR) 50 MG tablet Take 1 tablet (50 mg total) by mouth daily. 90 tablet 0  . sildenafil (REVATIO) 20 MG tablet TAKE 1 TO 2 TABLETS BY MOUTH 2 TO 3 HRS BEFORE SEX 30 tablet 5  . triamcinolone cream (KENALOG) 0.1 % Apply 1 application topically 2 (two) times daily. 30 g 0  . triamterene-hydrochlorothiazide (MAXZIDE) 75-50 MG tablet Take 1 tablet by mouth daily. 90 tablet 0   No current facility-administered medications on file prior to visit.     BP 140/80   Temp 97.6 F (36.4 C) (Temporal)   Ht 5\' 9"  (1.753 m)   Wt 193 lb (87.5 kg)   BMI 28.50 kg/m       Objective:   Physical Exam Vitals signs and nursing note reviewed.  Constitutional:       General: He is not in acute distress.    Appearance: Normal appearance. He is not diaphoretic.  HENT:     Head: Normocephalic and atraumatic.     Right Ear: Tympanic membrane, ear canal and external ear normal. There is no impacted cerumen.     Left Ear: Tympanic membrane, ear canal and external ear normal. There is no impacted cerumen.     Nose: Nose normal. No congestion or rhinorrhea.  Mouth/Throat:     Mouth: Mucous membranes are moist.     Pharynx: Oropharynx is clear. No oropharyngeal exudate or posterior oropharyngeal erythema.  Eyes:     General: No scleral icterus.       Right eye: No discharge.        Left eye: No discharge.     Conjunctiva/sclera: Conjunctivae normal.     Pupils: Pupils are equal, round, and reactive to light.  Neck:     Musculoskeletal: Normal range of motion and neck supple.     Thyroid: No thyromegaly.     Vascular: No JVD.     Trachea: No tracheal deviation.  Cardiovascular:     Rate and Rhythm: Normal rate and regular rhythm.     Pulses: Normal pulses.     Heart sounds: Normal heart sounds. No murmur. No friction rub. No gallop.   Pulmonary:     Effort: Pulmonary effort is normal. No respiratory distress.     Breath sounds: Normal breath sounds. No stridor. No wheezing, rhonchi or rales.  Chest:     Chest wall: No tenderness.  Abdominal:     General: Abdomen is flat. Bowel sounds are normal. There is no distension.     Palpations: Abdomen is soft. There is no mass.     Tenderness: There is no abdominal tenderness. There is no right CVA tenderness, left CVA tenderness, guarding or rebound.     Hernia: No hernia is present.  Musculoskeletal: Normal range of motion.        General: No swelling, tenderness, deformity or signs of injury.     Right lower leg: No edema.     Left lower leg: No edema.  Lymphadenopathy:     Cervical: No cervical adenopathy.  Skin:    General: Skin is warm and dry.     Capillary Refill: Capillary refill takes  less than 2 seconds.     Coloration: Skin is not jaundiced or pale.     Findings: No bruising, erythema, lesion or rash.  Neurological:     General: No focal deficit present.     Mental Status: He is alert and oriented to person, place, and time.     Cranial Nerves: No cranial nerve deficit.     Sensory: No sensory deficit.     Motor: No weakness or abnormal muscle tone.     Coordination: Coordination normal.     Gait: Gait normal.     Deep Tendon Reflexes: Reflexes are normal and symmetric. Reflexes normal.  Psychiatric:        Mood and Affect: Mood normal.        Behavior: Behavior normal.        Thought Content: Thought content normal.        Judgment: Judgment normal.       Assessment & Plan:  1. Routine general medical examination at a health care facility - Encouraged colonoscopy, heart healthy diet, and exercise  - Follow up in one year or sooner if needed - CBC with Differential/Platelet - Comprehensive metabolic panel - Lipid panel - PSA - TSH  2. Attention deficit disorder (ADD) without hyperactivity - Continue with Adderall 10 mg   3. Hypothyroidism, unspecified type - Consider dose change of synthroid  - CBC with Differential/Platelet - Comprehensive metabolic panel - Lipid panel - PSA - TSH  4. Idiopathic gout involving toe, unspecified chronicity, unspecified laterality - Continue with Allopurinol   5. Essential hypertension - Will have him monitor BP at  home and report back to me readings in 2 weeks via mychart - CBC with Differential/Platelet - Comprehensive metabolic panel - Lipid panel - PSA - TSH  [name], NP

## 2019-03-14 DIAGNOSIS — H52219 Irregular astigmatism, unspecified eye: Secondary | ICD-10-CM | POA: Diagnosis not present

## 2019-03-22 ENCOUNTER — Other Ambulatory Visit: Payer: Self-pay | Admitting: Adult Health

## 2019-03-22 DIAGNOSIS — F988 Other specified behavioral and emotional disorders with onset usually occurring in childhood and adolescence: Secondary | ICD-10-CM

## 2019-03-22 MED ORDER — AMPHETAMINE-DEXTROAMPHETAMINE 10 MG PO TABS: 10.0000 mg | ORAL_TABLET | Freq: Every day | ORAL | 0 refills | Status: DC

## 2019-04-08 ENCOUNTER — Other Ambulatory Visit: Payer: Self-pay | Admitting: Adult Health

## 2019-04-08 NOTE — Telephone Encounter (Signed)
Ok to refill. Yes, he was supposed to send me his readings in two weeks   Need BP f/u

## 2019-04-12 ENCOUNTER — Other Ambulatory Visit: Payer: Self-pay | Admitting: Adult Health

## 2019-04-12 NOTE — Telephone Encounter (Signed)
Pt scheduled for virtual follow up.  He has a bp cuff at home and rx sent to the pharmacy by e-scribe.

## 2019-04-19 ENCOUNTER — Telehealth (INDEPENDENT_AMBULATORY_CARE_PROVIDER_SITE_OTHER): Payer: Medicare HMO | Admitting: Adult Health

## 2019-04-19 ENCOUNTER — Other Ambulatory Visit: Payer: Self-pay

## 2019-04-19 DIAGNOSIS — I10 Essential (primary) hypertension: Secondary | ICD-10-CM | POA: Diagnosis not present

## 2019-04-19 MED ORDER — LISINOPRIL 5 MG PO TABS: 5.0000 mg | ORAL_TABLET | Freq: Every day | ORAL | 0 refills | Status: DC

## 2019-04-19 NOTE — Progress Notes (Signed)
Virtual Visit via Video Note  I connected with Cristela Blue on 04/19/19 at  1:30 PM EST by a video enabled telemedicine application and verified that I am speaking with the correct person using two identifiers.  Location patient: home Location provider:work or home office Persons participating in the virtual visit: patient, provider  I discussed the limitations of evaluation and management by telemedicine and the availability of in person appointments. The patient expressed understanding and agreed to proceed.  * Video failed during visit and ultimately had to resort to telephone    HPI:  73 year old male who is being evaluated today for follow-up regarding hypertension.  He is currently prescribed Maxzide 75-50 mg and metoprolol 50 mg.  He reports that at home his blood pressures have been consistently in the 140s over 80s.  Pulse has consistently been in the 50s.  He denies headaches, blurred vision, dizziness, or lightheadedness.   ROS: See pertinent positives and negatives per HPI.  Past Medical History:  Diagnosis Date  . Adult acne   . Cancer ([place]) 07/2017   skin cancer  . Gout   . Hypertension   . Thyroid disease     Past Surgical History:  Procedure Laterality Date  . HERNIA REPAIR    . SHOULDER SURGERY    . TONSILLECTOMY      Family History  Problem Relation Age of Onset  . Hypertension Other      Current Outpatient Medications:  .  allopurinol (ZYLOPRIM) 300 MG tablet, Take 1 tablet (300 mg total) by mouth daily., Disp: 100 tablet, Rfl: 3 .  amphetamine-dextroamphetamine (ADDERALL) 10 MG tablet, Take 1 tablet (10 mg total) by mouth daily., Disp: 30 tablet, Rfl: 0 .  amphetamine-dextroamphetamine (ADDERALL) 10 MG tablet, Take 1 tablet (10 mg total) by mouth daily., Disp: 30 tablet, Rfl: 0 .  amphetamine-dextroamphetamine (ADDERALL) 10 MG tablet, Take 1 tablet (10 mg total) by mouth daily., Disp: 30 tablet, Rfl: 0 .  doxycycline (VIBRAMYCIN) 100 MG capsule,  TAKE 1 CAPSULE (100 MG TOTAL) BY MOUTH 2 (TWO) TIMES DAILY., Disp: 30 capsule, Rfl: 3 .  finasteride (PROSCAR) 5 MG tablet, One by mouth daily, Disp: 100 tablet, Rfl: 3 .  levothyroxine (SYNTHROID) 150 MCG tablet, Take 1 tablet (150 mcg total) by mouth daily., Disp: 90 tablet, Rfl: 3 .  meloxicam (MOBIC) 7.5 MG tablet, Take 1 tablet by mouth daily as needed., Disp: , Rfl: 2 .  metoprolol tartrate (LOPRESSOR) 50 MG tablet, Take 1 tablet (50 mg total) by mouth daily., Disp: 90 tablet, Rfl: 3 .  sildenafil (REVATIO) 20 MG tablet, TAKE 1 TO 2 TABLETS BY MOUTH 2 TO 3 HRS BEFORE SEX, Disp: 30 tablet, Rfl: 5 .  triamcinolone cream (KENALOG) 0.1 %, Apply 1 application topically 2 (two) times daily., Disp: 30 g, Rfl: 0 .  triamterene-hydrochlorothiazide (MAXZIDE) 75-50 MG tablet, TAKE 1 TABLET BY MOUTH EVERY DAY, Disp: 90 tablet, Rfl: 0  EXAM:  VITALS per patient if applicable:  GENERAL: alert, oriented, appears well and in no acute distress  HEENT: atraumatic, conjunttiva clear, no obvious abnormalities on inspection of external nose and ears  NECK: normal movements of the head and neck  LUNGS: on inspection no signs of respiratory distress, breathing rate appears normal, no obvious gross SOB, gasping or wheezing  CV: no obvious cyanosis  MS: moves all visible extremities without noticeable abnormality  PSYCH/NEURO: pleasant and cooperative, no obvious depression or anxiety, speech and thought processing grossly intact  ASSESSMENT AND PLAN:  Discussed  the following assessment and plan:  1. Essential hypertension -Add low-dose lisinopril to see if we can get his pressure under better control.  He is staying active and eating healthy - lisinopril (ZESTRIL) 5 MG tablet; Take 1 tablet (5 mg total) by mouth daily.  Dispense: 30 tablet; Refill: 0 - Follow up in two weeks via mychart     I discussed the assessment and treatment plan with the patient. The patient was provided an opportunity to  ask questions and all were answered. The patient agreed with the plan and demonstrated an understanding of the instructions.   The patient was advised to call back or seek an in-person evaluation if the symptoms worsen or if the condition fails to improve as anticipated.   [name], NP

## 2019-05-12 ENCOUNTER — Other Ambulatory Visit: Payer: Self-pay | Admitting: Adult Health

## 2019-05-12 DIAGNOSIS — I10 Essential (primary) hypertension: Secondary | ICD-10-CM

## 2019-06-10 ENCOUNTER — Other Ambulatory Visit: Payer: Self-pay | Admitting: Adult Health

## 2019-06-10 DIAGNOSIS — I10 Essential (primary) hypertension: Secondary | ICD-10-CM

## 2019-06-17 ENCOUNTER — Ambulatory Visit: Payer: Medicare HMO | Attending: Internal Medicine

## 2019-06-17 DIAGNOSIS — Z23 Encounter for immunization: Secondary | ICD-10-CM | POA: Insufficient documentation

## 2019-06-17 NOTE — Progress Notes (Signed)
   Covid-19 Vaccination Clinic  Name:  Jose Evans    MRN: [id number] DOB: 1946/03/16  06/17/2019  [name] was observed post Covid-19 immunization for 15 minutes without incidence. He was provided with Vaccine Information Sheet and instruction to access the V-Safe system.   [name] was instructed to call 911 with any severe reactions post vaccine: [name] Difficulty breathing  . Swelling of your face and throat  . A fast heartbeat  . A bad rash all over your body  . Dizziness and weakness    Immunizations Administered    Name Date Dose VIS Date Route   Pfizer COVID-19 Vaccine 06/17/2019 12:30 PM 0.3 mL 04/01/2019 Intramuscular   Manufacturer: [place]   Lot: [id number]   [place]: [id number]

## 2019-06-22 ENCOUNTER — Other Ambulatory Visit: Payer: Self-pay

## 2019-06-23 ENCOUNTER — Encounter: Payer: Self-pay | Admitting: Adult Health

## 2019-06-23 ENCOUNTER — Telehealth: Payer: Self-pay | Admitting: Adult Health

## 2019-06-23 ENCOUNTER — Ambulatory Visit (INDEPENDENT_AMBULATORY_CARE_PROVIDER_SITE_OTHER): Payer: Medicare HMO | Admitting: Adult Health

## 2019-06-23 VITALS — BP 140/88 | Temp 98.0°F | Wt 191.0 lb

## 2019-06-23 DIAGNOSIS — E039 Hypothyroidism, unspecified: Secondary | ICD-10-CM

## 2019-06-23 DIAGNOSIS — R7309 Other abnormal glucose: Secondary | ICD-10-CM

## 2019-06-23 DIAGNOSIS — I739 Peripheral vascular disease, unspecified: Secondary | ICD-10-CM | POA: Diagnosis not present

## 2019-06-23 DIAGNOSIS — R7989 Other specified abnormal findings of blood chemistry: Secondary | ICD-10-CM

## 2019-06-23 LAB — T3, FREE: T3, Free: 3 pg/mL (ref 2.3–4.2)

## 2019-06-23 LAB — HEMOGLOBIN A1C: Hgb A1c MFr Bld: 6.2 % (ref 4.6–6.5)

## 2019-06-23 LAB — TSH: TSH: 5.33 u[IU]/mL — ABNORMAL HIGH (ref 0.35–4.50)

## 2019-06-23 LAB — T4, FREE: Free T4: 1.07 ng/dL (ref 0.60–1.60)

## 2019-06-23 NOTE — Progress Notes (Signed)
Subjective:    Patient ID: Witold Henriques, male    DOB: 1945/10/23, 74 y.o.   MRN: [id number]  HPI  74 year old male who has a history of peripheral neuropathy presents to the office today for the complaint of coldness and discoloration in bilateral feet.  He is not sure exactly how long this has been happening but has become more aware of the situation.  He reports that his wife tells him that his feet are cold when they are laying in bed and he has started to notice that when he wakes up in the morning his feet are "very purple".  He denies sensation of feeling though his feet are cold but when he does touch them they feel cold to him.  He denies calf pain, lower extremity swelling, sensation of leg weakness or tiredness, chest pain, or shortness of breath.  He does have a history of hypothyroidism and is currently on Synthroid 150 mcg.  During his last CPE in October 2020 his TSH was 8.0.  He was advised to return to the office and have his TSH rechecked but he never did.  Review of Systems  See HPI   Past Medical History:  Diagnosis Date  . Adult acne   . Cancer ([place]) 07/2017   skin cancer  . Gout   . Hypertension   . Thyroid disease     Social History   Socioeconomic History  . Marital status: Married    Spouse name: Not on file  . Number of children: Not on file  . Years of education: Not on file  . Highest education level: Not on file  Occupational History  . Not on file  Tobacco Use  . Smoking status: Former Smoker    Packs/day: 0.50    Years: 30.00    Pack years: 15.00    Types: Cigarettes    Quit date: 09/07/2005    Years since quitting: 13.8  . Smokeless tobacco: Never Used  Substance and Sexual Activity  . Alcohol use: Yes    Comment: FEW SHOTS A WEEK  . Drug use: No  . Sexual activity: Not on file  Other Topics Concern  . Not on file  Social History Narrative   Works for [employer]    Married   m   Social Determinants of Health    Financial Resource Strain:   . Difficulty of Paying Living Expenses: Not on file  Food Insecurity:   . Worried About Charity fundraiser in the Last Year: Not on file  . Ran Out of Food in the Last Year: Not on file  Transportation Needs:   . Lack of Transportation (Medical): Not on file  . Lack of Transportation (Non-Medical): Not on file  Physical Activity:   . Days of Exercise per Week: Not on file  . Minutes of Exercise per Session: Not on file  Stress:   . Feeling of Stress : Not on file  Social Connections:   . Frequency of Communication with Friends and Family: Not on file  . Frequency of Social Gatherings with Friends and Family: Not on file  . Attends Religious Services: Not on file  . Active Member of Clubs or Organizations: Not on file  . Attends Archivist Meetings: Not on file  . Marital Status: Not on file  Intimate Partner Violence:   . Fear of Current or Ex-Partner: Not on file  . Emotionally Abused: Not on file  . Physically Abused: Not  on file  . Sexually Abused: Not on file    Past Surgical History:  Procedure Laterality Date  . HERNIA REPAIR    . SHOULDER SURGERY    . TONSILLECTOMY      Family History  Problem Relation Age of Onset  . Hypertension Other     No Known Allergies  Current Outpatient Medications on File Prior to Visit  Medication Sig Dispense Refill  . allopurinol (ZYLOPRIM) 300 MG tablet Take 1 tablet (300 mg total) by mouth daily. 100 tablet 3  . amphetamine-dextroamphetamine (ADDERALL) 10 MG tablet Take 1 tablet (10 mg total) by mouth daily. 30 tablet 0  . amphetamine-dextroamphetamine (ADDERALL) 10 MG tablet Take 1 tablet (10 mg total) by mouth daily. 30 tablet 0  . finasteride (PROSCAR) 5 MG tablet One by mouth daily 100 tablet 3  . levothyroxine (SYNTHROID) 150 MCG tablet Take 1 tablet (150 mcg total) by mouth daily. 90 tablet 3  . lisinopril (ZESTRIL) 5 MG tablet TAKE 1 TABLET BY MOUTH EVERY DAY 30 tablet 0  .  meloxicam (MOBIC) 7.5 MG tablet Take 1 tablet by mouth daily as needed.  2  . metoprolol tartrate (LOPRESSOR) 50 MG tablet Take 1 tablet (50 mg total) by mouth daily. 90 tablet 3  . sildenafil (REVATIO) 20 MG tablet TAKE 1 TO 2 TABLETS BY MOUTH 2 TO 3 HRS BEFORE SEX 30 tablet 5  . triamcinolone cream (KENALOG) 0.1 % Apply 1 application topically 2 (two) times daily. 30 g 0  . triamterene-hydrochlorothiazide (MAXZIDE) 75-50 MG tablet TAKE 1 TABLET BY MOUTH EVERY DAY 90 tablet 0  . amphetamine-dextroamphetamine (ADDERALL) 10 MG tablet Take 1 tablet (10 mg total) by mouth daily. 30 tablet 0  . doxycycline (VIBRAMYCIN) 100 MG capsule TAKE 1 CAPSULE (100 MG TOTAL) BY MOUTH 2 (TWO) TIMES DAILY. (Patient not taking: Reported on 06/23/2019) 30 capsule 3   No current facility-administered medications on file prior to visit.    BP 140/88   Temp 98 F (36.7 C) (Temporal)   Wt 191 lb (86.6 kg)   BMI 28.21 kg/m       Objective:   Physical Exam Vitals and nursing note reviewed.  Constitutional:      Appearance: Normal appearance.  Cardiovascular:     Rate and Rhythm: Normal rate and regular rhythm.     Pulses: Normal pulses.     Heart sounds: Normal heart sounds.  Pulmonary:     Effort: Pulmonary effort is normal.     Breath sounds: Normal breath sounds.  Musculoskeletal:     Right lower leg: No edema.     Left lower leg: No edema.  Skin:    Capillary Refill: Capillary refill takes 2 to 3 seconds. Cap refill is sluggish in all digits of bilateral feet    Comments: - Purpleish discoloration to bilateral feet.  Feet and toes are cold to touch. -Blanching noted  Neurological:     General: No focal deficit present.     Mental Status: He is alert and oriented to person, place, and time.  Psychiatric:        Mood and Affect: Mood normal.        Behavior: Behavior normal.        Thought Content: Thought content normal.        Judgment: Judgment normal.       Assessment & Plan:  1. PVD  (peripheral vascular disease) ([place]) -Concern for PVD due to symptoms.  Check TSH levels but likely  refer to vein and vascular  2. Hypothyroidism, unspecified type  - TSH - T3, Free - T4, Free  3. Abnormal glucose  - Hemoglobin A1c  [name], NP

## 2019-06-23 NOTE — Telephone Encounter (Signed)
Spoke to patient and informed him of his lab work. Will refer to vascular surgery for further evaluation of suspected PVD

## 2019-06-27 ENCOUNTER — Other Ambulatory Visit: Payer: Self-pay | Admitting: Adult Health

## 2019-06-27 DIAGNOSIS — F988 Other specified behavioral and emotional disorders with onset usually occurring in childhood and adolescence: Secondary | ICD-10-CM

## 2019-06-28 ENCOUNTER — Encounter: Payer: Self-pay | Admitting: Adult Health

## 2019-06-28 ENCOUNTER — Other Ambulatory Visit: Payer: Self-pay | Admitting: Adult Health

## 2019-06-28 DIAGNOSIS — F988 Other specified behavioral and emotional disorders with onset usually occurring in childhood and adolescence: Secondary | ICD-10-CM

## 2019-06-28 MED ORDER — AMPHETAMINE-DEXTROAMPHETAMINE 10 MG PO TABS: 10.0000 mg | ORAL_TABLET | Freq: Every day | ORAL | 0 refills | Status: DC

## 2019-07-10 ENCOUNTER — Other Ambulatory Visit: Payer: Self-pay | Admitting: Adult Health

## 2019-07-12 ENCOUNTER — Ambulatory Visit: Payer: Medicare HMO | Attending: Internal Medicine

## 2019-07-12 ENCOUNTER — Other Ambulatory Visit: Payer: Self-pay | Admitting: Adult Health

## 2019-07-12 DIAGNOSIS — Z23 Encounter for immunization: Secondary | ICD-10-CM

## 2019-07-12 DIAGNOSIS — I10 Essential (primary) hypertension: Secondary | ICD-10-CM

## 2019-07-12 NOTE — Progress Notes (Signed)
   Covid-19 Vaccination Clinic  Name:  Perla Letner    MRN: [id number] DOB: [birth year]  07/12/2019  Mr. Kollmann was observed post Covid-19 immunization for 15 minutes without incident. He was provided with Vaccine Information Sheet and instruction to access the V-Safe system.   Mr. Kolber was instructed to call 911 with any severe reactions post vaccine: [name] Difficulty breathing  . Swelling of face and throat  . A fast heartbeat  . A bad rash all over body  . Dizziness and weakness   Immunizations Administered    Name Date Dose VIS Date Route   Pfizer COVID-19 Vaccine 07/12/2019  3:16 PM 0.3 mL 04/01/2019 Intramuscular   Manufacturer: [place]   Lot: [id number]   [place]: [id number]

## 2019-07-20 ENCOUNTER — Other Ambulatory Visit: Payer: Self-pay | Admitting: Adult Health

## 2019-07-21 NOTE — Telephone Encounter (Signed)
Prescription sent to the pharmacy on 07/11/2019.

## 2019-07-25 ENCOUNTER — Telehealth (HOSPITAL_COMMUNITY): Payer: Self-pay

## 2019-07-25 ENCOUNTER — Other Ambulatory Visit: Payer: Self-pay | Admitting: *Deleted

## 2019-07-25 DIAGNOSIS — M25569 Pain in unspecified knee: Secondary | ICD-10-CM

## 2019-07-25 NOTE — Telephone Encounter (Signed)

## 2019-07-26 ENCOUNTER — Other Ambulatory Visit: Payer: Self-pay

## 2019-07-26 ENCOUNTER — Encounter: Payer: Self-pay | Admitting: Vascular Surgery

## 2019-07-26 ENCOUNTER — Ambulatory Visit (INDEPENDENT_AMBULATORY_CARE_PROVIDER_SITE_OTHER): Payer: Medicare HMO | Admitting: Vascular Surgery

## 2019-07-26 ENCOUNTER — Ambulatory Visit (HOSPITAL_COMMUNITY)
Admission: RE | Admit: 2019-07-26 | Discharge: 2019-07-26 | Disposition: A | Discharge status: Home / Self Care | Payer: Medicare HMO | Source: Ambulatory Visit | Attending: Vascular Surgery | Admitting: Vascular Surgery

## 2019-07-26 DIAGNOSIS — M25569 Pain in unspecified knee: Secondary | ICD-10-CM | POA: Diagnosis not present

## 2019-07-26 DIAGNOSIS — G629 Polyneuropathy, unspecified: Secondary | ICD-10-CM | POA: Diagnosis not present

## 2019-07-26 NOTE — Progress Notes (Signed)
Patient name: Jose Evans MRN: [id number] DOB: June 17, 1945 Sex: male  REASON FOR CONSULT: Evaluate for PVD and cold feet  HPI: Jose Evans is a 74 y.o. male, with history of hypertension and peripheral neuropathy that presents for evaluation of PVD and cold feet.  He describes at least a year or of pins-and-needles and tingling sensation in distal half of both feet.  He states at times this can be bothersome in the middle the night.  He did see Dr. Paulla Dolly with podiatry who diagnosed him with neuropathy and started him on gabapentin.  This obviously makes him very sleepy and he is not able to tolerate 3 tablets a day as indicated.  He wanted further answers as to why he has neuropathy and wanted a vascular surgery evaluation.  He denies any pain in the legs when he walks.  He has had no tissue loss or other nonhealing wounds.  No previous lower extremity revascularizations.  Does not smoke.  Past Medical History:  Diagnosis Date  . Adult acne   . Cancer ([place]) 07/2017   skin cancer  . Gout   . Hypertension   . Thyroid disease     Past Surgical History:  Procedure Laterality Date  . HERNIA REPAIR    . SHOULDER SURGERY    . TONSILLECTOMY      Family History  Problem Relation Age of Onset  . Hypertension Other     SOCIAL HISTORY: Social History   Socioeconomic History  . Marital status: Married    Spouse name: Not on file  . Number of children: Not on file  . Years of education: Not on file  . Highest education level: Not on file  Occupational History  . Not on file  Tobacco Use  . Smoking status: Former Smoker    Packs/day: 0.50    Years: 30.00    Pack years: 15.00    Types: Cigarettes    Quit date: 09/07/2005    Years since quitting: 13.8  . Smokeless tobacco: Never Used  Substance and Sexual Activity  . Alcohol use: Yes    Comment: FEW SHOTS A WEEK  . Drug use: No  . Sexual activity: Not on file  Other Topics Concern  . Not on file  Social History Narrative     Works for [employer]    Married   m   Social Determinants of Radio broadcast assistant Strain:   . Difficulty of Paying Living Expenses:   Food Insecurity:   . Worried About Charity fundraiser in the Last Year:   . Arboriculturist in the Last Year:   Transportation Needs:   . Film/video editor (Medical):   [name] Lack of Transportation (Non-Medical):   Physical Activity:   . Days of Exercise per Week:   . Minutes of Exercise per Session:   Stress:   . Feeling of Stress :   Social Connections:   . Frequency of Communication with Friends and Family:   . Frequency of Social Gatherings with Friends and Family:   . Attends Religious Services:   . Active Member of Clubs or Organizations:   . Attends Archivist Meetings:   [name] Marital Status:   Intimate Partner Violence:   . Fear of Current or Ex-Partner:   . Emotionally Abused:   [name] Physically Abused:   . Sexually Abused:     No Known Allergies  Current Outpatient Medications  Medication Sig Dispense Refill  .  allopurinol (ZYLOPRIM) 300 MG tablet Take 1 tablet (300 mg total) by mouth daily. 100 tablet 3  . amphetamine-dextroamphetamine (ADDERALL) 10 MG tablet Take 1 tablet (10 mg total) by mouth daily. 30 tablet 0  . amphetamine-dextroamphetamine (ADDERALL) 10 MG tablet Take 1 tablet (10 mg total) by mouth daily. 30 tablet 0  . amphetamine-dextroamphetamine (ADDERALL) 10 MG tablet Take 1 tablet (10 mg total) by mouth daily. 30 tablet 0  . doxycycline (VIBRAMYCIN) 100 MG capsule TAKE 1 CAPSULE (100 MG TOTAL) BY MOUTH 2 (TWO) TIMES DAILY. 30 capsule 3  . finasteride (PROSCAR) 5 MG tablet One by mouth daily 100 tablet 3  . levothyroxine (SYNTHROID) 150 MCG tablet Take 1 tablet (150 mcg total) by mouth daily. 90 tablet 3  . lisinopril (ZESTRIL) 5 MG tablet TAKE 1 TABLET BY MOUTH EVERY DAY 90 tablet 1  . meloxicam (MOBIC) 7.5 MG tablet Take 1 tablet by mouth daily as needed.  2  . metoprolol tartrate  (LOPRESSOR) 50 MG tablet Take 1 tablet (50 mg total) by mouth daily. 90 tablet 3  . sildenafil (REVATIO) 20 MG tablet TAKE 1 TO 2 TABLETS BY MOUTH 2 TO 3 HRS BEFORE SEX 30 tablet 5  . triamcinolone cream (KENALOG) 0.1 % Apply 1 application topically 2 (two) times daily. 30 g 0  . triamterene-hydrochlorothiazide (MAXZIDE) 75-50 MG tablet TAKE 1 TABLET BY MOUTH EVERY DAY 90 tablet 0   No current facility-administered medications for this visit.    REVIEW OF SYSTEMS:  [X]  denotes positive finding, [ ]  denotes negative finding Cardiac  Comments:  Chest pain or chest pressure:    Shortness of breath upon exertion:    Short of breath when lying flat:    Irregular heart rhythm:        Vascular    Pain in calf, thigh, or hip brought on by ambulation:    Pain in feet at night that wakes you up from your sleep:  x   Blood clot in your veins:    Leg swelling:         Pulmonary    Oxygen at home:    Productive cough:     Wheezing:         Neurologic    Sudden weakness in arms or legs:     Sudden numbness in arms or legs:     Sudden onset of difficulty speaking or slurred speech:    Temporary loss of vision in one eye:     Problems with dizziness:         Gastrointestinal    Blood in stool:     Vomited blood:         Genitourinary    Burning when urinating:     Blood in urine:        Psychiatric    Major depression:         Hematologic    Bleeding problems:    Problems with blood clotting too easily:        Skin    Rashes or ulcers:        Constitutional    Fever or chills:      PHYSICAL EXAM: Vitals:   07/26/19 0928  BP: (!) 147/84  Pulse: (!) 50  Resp: 18  Temp: 97.6 F (36.4 C)  TempSrc: Temporal  SpO2: 99%  Weight: 190 lb (86.2 kg)  Height: 5' 9.5" (1.765 m)    GENERAL: The patient is a well-nourished male, in no acute distress. The  vital signs are documented above. CARDIAC: There is a regular rate and rhythm.  VASCULAR:  Palpable radial pulses  bilateral upper extremities Palpable femoral pulses bilateral groins Palpable DP pulses bilateral lower extremities PULMONARY: There is good air exchange bilaterally without wheezing or rales. ABDOMEN: Soft and non-tender with normal pitched bowel sounds.  MUSCULOSKELETAL: There are no major deformities or cyanosis. NEUROLOGIC: No focal weakness or paresthesias are detected. SKIN: There are no ulcers or rashes noted. PSYCHIATRIC: The patient has a normal affect.  DATA:   ABIs revealed ABI of 1.21 on the right triphasic and 1.16 on the left triphasic-these are normal   Assessment/Plan:  74 year old male that presents for evaluation of peripheral vascular disease in his lower extremities given a recent diagnosis of peripheral neuropathy.  Discussed with him that he has normal noninvasive imaging with normal ABI greater than 1 and triphasic waveforms at the ankles.  In addition he has normal exam with palpable pedal pulses.  He does have some subjective complaints of coolness in the extremities and I discussed with his exam and noninvasive imaging there would be no role for any further vascular intervention.  He should be at no increased risk for limb loss.  He has normal perfusion to both lower extremities.  Discussed that I'd be happy to refer him to neurology for further evaluation of his neuropathy but he just wanted reassurance regarding his lower extremity perfusion.  Offered follow-up as needed.   Marty Heck, MD [facility] Office: [phone]

## 2019-08-22 ENCOUNTER — Encounter: Payer: Self-pay | Admitting: Adult Health

## 2019-09-30 ENCOUNTER — Encounter: Payer: Self-pay | Admitting: Adult Health

## 2019-09-30 ENCOUNTER — Other Ambulatory Visit: Payer: Self-pay | Admitting: Adult Health

## 2019-09-30 DIAGNOSIS — F988 Other specified behavioral and emotional disorders with onset usually occurring in childhood and adolescence: Secondary | ICD-10-CM

## 2019-09-30 MED ORDER — AMPHETAMINE-DEXTROAMPHETAMINE 10 MG PO TABS: 10.0000 mg | ORAL_TABLET | Freq: Every day | ORAL | 0 refills | Status: DC

## 2019-10-11 ENCOUNTER — Other Ambulatory Visit: Payer: Self-pay | Admitting: Adult Health

## 2019-10-12 NOTE — Telephone Encounter (Signed)
SENT TO THE PHARMACY BY E-SCRIBE. 

## 2019-10-31 ENCOUNTER — Other Ambulatory Visit: Payer: Self-pay | Admitting: Adult Health

## 2019-11-01 NOTE — Telephone Encounter (Signed)
SENT TO THE PHARMACY ON 10/12/2019 FOR 90 DAYS.  REQUEST IS TOO EARLY.

## 2019-12-08 ENCOUNTER — Encounter: Payer: Self-pay | Admitting: Adult Health

## 2019-12-08 ENCOUNTER — Other Ambulatory Visit: Payer: Self-pay

## 2019-12-08 ENCOUNTER — Ambulatory Visit (INDEPENDENT_AMBULATORY_CARE_PROVIDER_SITE_OTHER): Payer: Medicare HMO | Admitting: Adult Health

## 2019-12-08 VITALS — BP 138/80 | HR 54 | Temp 98.0°F | Wt 193.0 lb

## 2019-12-08 DIAGNOSIS — M25512 Pain in left shoulder: Secondary | ICD-10-CM | POA: Diagnosis not present

## 2019-12-08 DIAGNOSIS — M25511 Pain in right shoulder: Secondary | ICD-10-CM

## 2019-12-08 DIAGNOSIS — D367 Benign neoplasm of other specified sites: Secondary | ICD-10-CM | POA: Diagnosis not present

## 2019-12-08 DIAGNOSIS — R55 Syncope and collapse: Secondary | ICD-10-CM | POA: Diagnosis not present

## 2019-12-08 DIAGNOSIS — G8929 Other chronic pain: Secondary | ICD-10-CM

## 2019-12-08 MED ORDER — METHYLPREDNISOLONE ACETATE 80 MG/ML IJ SUSP
80.0000 mg | Freq: Once | INTRAMUSCULAR | Status: AC
  Administered 2019-12-08: 80 mg via INTRAMUSCULAR

## 2019-12-08 NOTE — Patient Instructions (Signed)
Health Maintenance Due  Topic Date Due  . Hepatitis C Screening  Never done  . INFLUENZA VACCINE  11/20/2019    Depression screen [facility] 2/9 12/30/2017 05/28/2015 03/13/2014  Decreased Interest 0 0 0  Down, Depressed, Hopeless 0 0 0  PHQ - 2 Score 0 0 0

## 2019-12-08 NOTE — Progress Notes (Signed)
Subjective:    Patient ID: Jose Evans, male    DOB: December 23, 1945, 74 y.o.   MRN: [id number]  HPI 74 year old male who  has a past medical history of Adult acne, Cancer ([place]) (07/2017), Gout, Hypertension, and Thyroid disease.  He presents to the office today for multiple issues.  #1 he has a known history of a small cyst behind his left ear.  He wants to make sure that this is nothing that he needs to worry about at this time.  Does have some intermittent discomfort at times from the cyst but other than that does not bother him.  He has not had any redness, warmth, or drainage from the cyst.  #2 left shoulder pain.  This is a chronic issue feels as though it is getting worse lately.  Does not necessarily have any issues with of range of motion but will have pain and "cracking" sensation with range of motion.  He did receive a steroid injection in his right shoulder sometime ago and responded very well to this.  He is wondering if he can get another injection into his left shoulder.  #3 intermittently for the last 8 years he has had some confusing symptoms.  Per patient report from time to time he will experience "the feeling of brain freeze all over my body".  This is preceded by the sensation of a "light chlorine smell".  He also developed tunnel vision and worsening tinnitus with these flares.  He denies dizziness or blurred vision.  Most recently last week he had this sensation come over him when he was at the refrigerator.  At this time his vision went black and he fell to the ground but did not lose consciousness.  After a few moments his vision returned and he was able to get up off the floor.  This has never happened to him.   Review of Systems See HPI   Past Medical History:  Diagnosis Date   Adult acne    Cancer ([place]) 07/2017   skin cancer   Gout    Hypertension    Thyroid disease     Social History   Socioeconomic History   Marital status: Married    Spouse name:  Not on file   Number of children: Not on file   Years of education: Not on file   Highest education level: Not on file  Occupational History   Not on file  Tobacco Use   Smoking status: Former Smoker    Packs/day: 0.50    Years: 30.00    Pack years: 15.00    Types: Cigarettes    Quit date: 09/07/2005    Years since quitting: 14.2   Smokeless tobacco: Never Used  Vaping Use   Vaping Use: Never used  Substance and Sexual Activity   Alcohol use: Yes    Comment: FEW SHOTS A WEEK   Drug use: No   Sexual activity: Not on file  Other Topics Concern   Not on file  Social History Narrative   Works for [employer]    Married   m   Social Determinants of Health   Financial Resource Strain:    Difficulty of Paying Living Expenses: Not on file  Food Insecurity:    Worried About Charity fundraiser in the Last Year: Not on file   [employer] of Food in the Last Year: Not on file  Transportation Needs:    Lack of Transportation (Medical): Not  on file   Lack of Transportation (Non-Medical): Not on file  Physical Activity:    Days of Exercise per Week: Not on file   Minutes of Exercise per Session: Not on file  Stress:    Feeling of Stress : Not on file  Social Connections:    Frequency of Communication with Friends and Family: Not on file   Frequency of Social Gatherings with Friends and Family: Not on file   Attends Religious Services: Not on file   Active Member of Clubs or Organizations: Not on file   Attends Archivist Meetings: Not on file   Marital Status: Not on file  Intimate Partner Violence:    Fear of Current or Ex-Partner: Not on file   Emotionally Abused: Not on file   Physically Abused: Not on file   Sexually Abused: Not on file    Past Surgical History:  Procedure Laterality Date   HERNIA REPAIR     SHOULDER SURGERY     TONSILLECTOMY      Family History  Problem Relation Age of Onset   Hypertension Other      No Known Allergies  Current Outpatient Medications on File Prior to Visit  Medication Sig Dispense Refill   allopurinol (ZYLOPRIM) 300 MG tablet Take 1 tablet (300 mg total) by mouth daily. 100 tablet 3   amphetamine-dextroamphetamine (ADDERALL) 10 MG tablet Take 1 tablet (10 mg total) by mouth daily. 30 tablet 0   amphetamine-dextroamphetamine (ADDERALL) 10 MG tablet Take 1 tablet (10 mg total) by mouth daily. 30 tablet 0   doxycycline (VIBRAMYCIN) 100 MG capsule TAKE 1 CAPSULE (100 MG TOTAL) BY MOUTH 2 (TWO) TIMES DAILY. 30 capsule 3   finasteride (PROSCAR) 5 MG tablet One by mouth daily 100 tablet 3   levothyroxine (SYNTHROID) 150 MCG tablet Take 1 tablet (150 mcg total) by mouth daily. 90 tablet 3   lisinopril (ZESTRIL) 5 MG tablet TAKE 1 TABLET BY MOUTH EVERY DAY 90 tablet 1   meloxicam (MOBIC) 7.5 MG tablet Take 1 tablet by mouth daily as needed.  2   metoprolol tartrate (LOPRESSOR) 50 MG tablet Take 1 tablet (50 mg total) by mouth daily. 90 tablet 3   sildenafil (REVATIO) 20 MG tablet TAKE 1 TO 2 TABLETS BY MOUTH 2 TO 3 HRS BEFORE SEX 30 tablet 5   triamcinolone cream (KENALOG) 0.1 % Apply 1 application topically 2 (two) times daily. 30 g 0   triamterene-hydrochlorothiazide (MAXZIDE) 75-50 MG tablet TAKE 1 TABLET BY MOUTH EVERY DAY 90 tablet 0   amphetamine-dextroamphetamine (ADDERALL) 10 MG tablet Take 1 tablet (10 mg total) by mouth daily. 30 tablet 0   No current facility-administered medications on file prior to visit.    BP 138/80    Pulse (!) 54    Temp 98 F (36.7 C) (Oral)    Wt 193 lb (87.5 kg)    SpO2 97%    BMI 28.09 kg/m       Objective:   Physical Exam Vitals and nursing note reviewed.  Constitutional:      Appearance: Normal appearance.  Eyes:     Extraocular Movements: Extraocular movements intact.     Pupils: Pupils are equal, round, and reactive to light.  Cardiovascular:     Rate and Rhythm: Normal rate and regular rhythm.     Pulses:  Normal pulses.     Heart sounds: Normal heart sounds.  Pulmonary:     Effort: Pulmonary effort is normal.  Breath sounds: Normal breath sounds.  Musculoskeletal:        General: Normal range of motion.     Left shoulder: Bony tenderness and crepitus present. No tenderness. Normal range of motion. Normal strength. Normal pulse.  Skin:    General: Skin is warm and dry.     Capillary Refill: Capillary refill takes less than 2 seconds.  Neurological:     General: No focal deficit present.     Mental Status: He is alert and oriented to person, place, and time.     Cranial Nerves: No cranial nerve deficit.     Sensory: No sensory deficit.     Motor: No weakness.     Coordination: Coordination normal.     Gait: Gait normal.     Deep Tendon Reflexes: Reflexes normal.  Psychiatric:        Mood and Affect: Mood normal.        Thought Content: Thought content normal.        Judgment: Judgment normal.        Assessment & Plan:  1. Chronic left shoulder pain Shoulder injection Verbal consent obtained and verified. Sterile betadine prep. Furthur cleansed with alcohol. Topical analgesic spray: Ethyl chloride. Joint: left subacromial injection Approached in typical fashion with: posterior approach Completed without difficulty Meds: 3 cc lidocaine 2% no epi, 1 cc depomedrol 80mg /cc Needle:1.5 inch 25 gauge Aftercare instructions and Red flags advised. Immediate improvement in pain noted  - methylPREDNISolone acetate (DEPO-MEDROL) injection 80 mg  2. Dermoid cyst of neck - Watchful waiting   3. Syncope, unspecified syncope type -I advised work-up including MRI of head and neck due to unknown cause.  Consider carotid ultrasound and stress test.  Patient would like to hold off at this time and see if anything happens before his physical in October.  He was advised to follow-up sooner if needed and we can order these tests.  [name], NP

## 2020-01-06 ENCOUNTER — Encounter: Payer: Self-pay | Admitting: Adult Health

## 2020-01-09 ENCOUNTER — Other Ambulatory Visit: Payer: Self-pay | Admitting: Adult Health

## 2020-01-09 DIAGNOSIS — F988 Other specified behavioral and emotional disorders with onset usually occurring in childhood and adolescence: Secondary | ICD-10-CM

## 2020-01-09 NOTE — Telephone Encounter (Signed)
Please advise 

## 2020-01-10 MED ORDER — AMPHETAMINE-DEXTROAMPHETAMINE 10 MG PO TABS: 10.0000 mg | ORAL_TABLET | Freq: Every day | ORAL | 0 refills | Status: DC

## 2020-01-10 MED ORDER — TRIAMTERENE-HCTZ 75-50 MG PO TABS: 1.0000 | ORAL_TABLET | Freq: Every day | ORAL | 0 refills | Status: DC

## 2020-01-26 ENCOUNTER — Encounter: Payer: Self-pay | Admitting: Adult Health

## 2020-01-26 ENCOUNTER — Other Ambulatory Visit: Payer: Self-pay

## 2020-01-26 ENCOUNTER — Telehealth: Payer: Self-pay | Admitting: *Deleted

## 2020-01-26 ENCOUNTER — Ambulatory Visit (INDEPENDENT_AMBULATORY_CARE_PROVIDER_SITE_OTHER): Payer: Medicare HMO | Admitting: Adult Health

## 2020-01-26 VITALS — BP 124/80 | HR 47 | Temp 98.0°F | Ht 68.0 in | Wt 192.1 lb

## 2020-01-26 DIAGNOSIS — E039 Hypothyroidism, unspecified: Secondary | ICD-10-CM

## 2020-01-26 DIAGNOSIS — M25512 Pain in left shoulder: Secondary | ICD-10-CM

## 2020-01-26 DIAGNOSIS — G8929 Other chronic pain: Secondary | ICD-10-CM

## 2020-01-26 DIAGNOSIS — Z23 Encounter for immunization: Secondary | ICD-10-CM | POA: Diagnosis not present

## 2020-01-26 DIAGNOSIS — G479 Sleep disorder, unspecified: Secondary | ICD-10-CM

## 2020-01-26 DIAGNOSIS — R7309 Other abnormal glucose: Secondary | ICD-10-CM

## 2020-01-26 DIAGNOSIS — N401 Enlarged prostate with lower urinary tract symptoms: Secondary | ICD-10-CM | POA: Diagnosis not present

## 2020-01-26 DIAGNOSIS — Z1211 Encounter for screening for malignant neoplasm of colon: Secondary | ICD-10-CM | POA: Diagnosis not present

## 2020-01-26 DIAGNOSIS — R351 Nocturia: Secondary | ICD-10-CM | POA: Diagnosis not present

## 2020-01-26 DIAGNOSIS — I10 Essential (primary) hypertension: Secondary | ICD-10-CM | POA: Diagnosis not present

## 2020-01-26 DIAGNOSIS — Z0000 Encounter for general adult medical examination without abnormal findings: Secondary | ICD-10-CM | POA: Diagnosis not present

## 2020-01-26 MED ORDER — TRAZODONE HCL 50 MG PO TABS: 50.0000 mg | ORAL_TABLET | Freq: Every evening | ORAL | 0 refills | Status: DC | PRN

## 2020-01-26 NOTE — Patient Instructions (Signed)
It was great seeing you today   We will follow up with you regarding your blood work   I have sent in a prescription for Trazodone to help with sleep and shoulder pain   Someone from sports medicine will call you to schedule your appointment

## 2020-01-26 NOTE — Progress Notes (Signed)
Subjective:    Patient ID: Jose Evans, male    DOB: Jul 04, 1945, 74 y.o.   MRN: [id number]  HPI Patient presents for yearly preventative medicine examination. He is a pleasant 74 year old male who  has a past medical history of Adult acne, Cancer ([place]) (07/2017), Gout, Hypertension, and Thyroid disease.  Hypothyroidism - Takes Synthroid 150 mg. Feels well controlled on that.  Lab Results  Component Value Date   TSH 5.33 (H) 06/23/2019   Hypertension - Takes lisinopril 5 mg, Maxide 75-50 and lopressor 50 mg. He denies dizziness, lightheadedness, shortness of breath BP Readings from Last 3 Encounters:  01/26/20 124/80  12/08/19 138/80  07/26/19 (!) 147/84   Gout - takes Allopurinol 300 mg daily. He denies any recent gout flares  ADHD - Controlled with Adderall 10 mg daily.   BPH - feels well controlled with Proscar 5 mg. Denies symptoms   Sleep Disturbance -has not had any trouble falling asleep but as of recently reports that he wakes up 2-hour intervals. He has been having a hard time getting comfortable due to chronic left shoulder pain.  Chronic Left Shoulder Pain was last seen in August 2021 as he felt as though his shoulder pain was getting worse. He did not necessarily have any issues with range of motion but would have some pain and cracking sensation with different motions. We decided to do a steroid injection which he reports helped but only for a short period of time. He continues to have issues with pain when he is sleeping and when he is using his hands over his head.  All immunizations and health maintenance protocols were reviewed with the patient and needed orders were placed.  Appropriate screening laboratory values were ordered for the patient including screening of hyperlipidemia, renal function and hepatic function. If indicated by BPH, a PSA was ordered.  Medication reconciliation,  past medical history, social history, problem list and allergies were reviewed  in detail with the patient  Goals were established with regard to weight loss, exercise, and  diet in compliance with medications. He does not exercise on a routine basis but stays active. He does eat out quite often.  Past he has refused Cologuard and a colonoscopy. Today he is willing to do Cologuard but refuses a colonoscopy   Review of Systems  Constitutional: Negative.   HENT: Negative.   Eyes: Negative.   Respiratory: Negative.   Cardiovascular: Negative.   Gastrointestinal: Negative.   Endocrine: Negative.   Genitourinary: Negative.   Musculoskeletal: Positive for arthralgias.  Skin: Positive for pallor.  Allergic/Immunologic: Negative.   Neurological: Negative.   Hematological: Negative.   Psychiatric/Behavioral: Negative.   All other systems reviewed and are negative.  Past Medical History:  Diagnosis Date  . Adult acne   . Cancer ([place]) 07/2017   skin cancer  . Gout   . Hypertension   . Thyroid disease     Social History   Socioeconomic History  . Marital status: Married    Spouse name: Not on file  . Number of children: Not on file  . Years of education: Not on file  . Highest education level: Not on file  Occupational History  . Not on file  Tobacco Use  . Smoking status: Former Smoker    Packs/day: 0.50    Years: 30.00    Pack years: 15.00    Types: Cigarettes    Quit date: 09/07/2005    Years since quitting: 14.3  . Smokeless  tobacco: Never Used  Vaping Use  . Vaping Use: Never used  Substance and Sexual Activity  . Alcohol use: Yes    Comment: FEW SHOTS A WEEK  . Drug use: No  . Sexual activity: Not on file  Other Topics Concern  . Not on file  Social History Narrative   Works for [employer]    Married   m   Social Determinants of Health   Financial Resource Strain:   . Difficulty of Paying Living Expenses: Not on file  Food Insecurity:   . Worried About Charity fundraiser in the Last Year: Not on file  . Ran Out of  Food in the Last Year: Not on file  Transportation Needs:   . Lack of Transportation (Medical): Not on file  . Lack of Transportation (Non-Medical): Not on file  Physical Activity:   . Days of Exercise per Week: Not on file  . Minutes of Exercise per Session: Not on file  Stress:   . Feeling of Stress : Not on file  Social Connections:   . Frequency of Communication with Friends and Family: Not on file  . Frequency of Social Gatherings with Friends and Family: Not on file  . Attends Religious Services: Not on file  . Active Member of Clubs or Organizations: Not on file  . Attends Archivist Meetings: Not on file  . Marital Status: Not on file  Intimate Partner Violence:   . Fear of Current or Ex-Partner: Not on file  . Emotionally Abused: Not on file  . Physically Abused: Not on file  . Sexually Abused: Not on file    Past Surgical History:  Procedure Laterality Date  . HERNIA REPAIR    . SHOULDER SURGERY    . TONSILLECTOMY      Family History  Problem Relation Age of Onset  . Hypertension Other     No Known Allergies  Current Outpatient Medications on File Prior to Visit  Medication Sig Dispense Refill  . allopurinol (ZYLOPRIM) 300 MG tablet Take 1 tablet (300 mg total) by mouth daily. 100 tablet 3  . amphetamine-dextroamphetamine (ADDERALL) 10 MG tablet Take 1 tablet (10 mg total) by mouth daily. 30 tablet 0  . amphetamine-dextroamphetamine (ADDERALL) 10 MG tablet Take 1 tablet (10 mg total) by mouth daily. 30 tablet 0  . amphetamine-dextroamphetamine (ADDERALL) 10 MG tablet Take 1 tablet (10 mg total) by mouth daily. 30 tablet 0  . doxycycline (VIBRAMYCIN) 100 MG capsule TAKE 1 CAPSULE (100 MG TOTAL) BY MOUTH 2 (TWO) TIMES DAILY. 30 capsule 3  . finasteride (PROSCAR) 5 MG tablet One by mouth daily 100 tablet 3  . levothyroxine (SYNTHROID) 150 MCG tablet Take 1 tablet (150 mcg total) by mouth daily. 90 tablet 3  . lisinopril (ZESTRIL) 5 MG tablet TAKE 1  TABLET BY MOUTH EVERY DAY 90 tablet 1  . meloxicam (MOBIC) 7.5 MG tablet Take 1 tablet by mouth daily as needed.  2  . metoprolol tartrate (LOPRESSOR) 50 MG tablet Take 1 tablet (50 mg total) by mouth daily. 90 tablet 3  . sildenafil (REVATIO) 20 MG tablet TAKE 1 TO 2 TABLETS BY MOUTH 2 TO 3 HRS BEFORE SEX 30 tablet 5  . triamcinolone cream (KENALOG) 0.1 % Apply 1 application topically 2 (two) times daily. 30 g 0  . triamterene-hydrochlorothiazide (MAXZIDE) 75-50 MG tablet Take 1 tablet by mouth daily. 90 tablet 0   No current facility-administered medications on file prior to visit.  BP 124/80 (BP Location: Left Arm, Patient Position: Sitting, Cuff Size: Large)   Pulse (!) 47   Temp 98 F (36.7 C) (Oral)   Ht 5' 8"  (1.727 m)   Wt 192 lb 1.6 oz (87.1 kg)   BMI 29.21 kg/m       Objective:   Physical Exam Vitals and nursing note reviewed.  Constitutional:      General: He is not in acute distress.    Appearance: Normal appearance. He is well-developed and normal weight.  HENT:     Head: Normocephalic and atraumatic.     Right Ear: Tympanic membrane, ear canal and external ear normal. There is no impacted cerumen.     Left Ear: Tympanic membrane, ear canal and external ear normal. There is no impacted cerumen.     Nose: Nose normal. No congestion or rhinorrhea.     Mouth/Throat:     Mouth: Mucous membranes are moist.     Pharynx: Oropharynx is clear. No oropharyngeal exudate or posterior oropharyngeal erythema.  Eyes:     General:        Right eye: No discharge.        Left eye: No discharge.     Extraocular Movements: Extraocular movements intact.     Conjunctiva/sclera: Conjunctivae normal.     Pupils: Pupils are equal, round, and reactive to light.  Neck:     Vascular: No carotid bruit.     Trachea: No tracheal deviation.  Cardiovascular:     Rate and Rhythm: Normal rate and regular rhythm.     Pulses: Normal pulses.     Heart sounds: Normal heart sounds. No murmur  heard.  No friction rub. No gallop.   Pulmonary:     Effort: Pulmonary effort is normal. No respiratory distress.     Breath sounds: Normal breath sounds. No stridor. No wheezing, rhonchi or rales.  Chest:     Chest wall: No tenderness.  Abdominal:     General: Bowel sounds are normal. There is no distension.     Palpations: Abdomen is soft. There is no mass.     Tenderness: There is no abdominal tenderness. There is no right CVA tenderness, left CVA tenderness, guarding or rebound.     Hernia: No hernia is present.  Musculoskeletal:        General: No swelling, tenderness, deformity or signs of injury. Normal range of motion.     Right lower leg: No edema.     Left lower leg: No edema.  Lymphadenopathy:     Cervical: No cervical adenopathy.  Skin:    General: Skin is warm and dry.     Capillary Refill: Capillary refill takes less than 2 seconds.     Coloration: Skin is not jaundiced or pale.     Findings: No bruising, erythema, lesion or rash.  Neurological:     General: No focal deficit present.     Mental Status: He is alert and oriented to person, place, and time.     Cranial Nerves: No cranial nerve deficit.     Sensory: No sensory deficit.     Motor: No weakness.     Coordination: Coordination normal.     Gait: Gait normal.     Deep Tendon Reflexes: Reflexes normal.  Psychiatric:        Mood and Affect: Mood normal.        Behavior: Behavior normal.        Thought Content: Thought content normal.  Judgment: Judgment normal.       Assessment & Plan:  1. Routine general medical examination at a health care facility - Encouraged diet and exercise - Follow up in one year or sooner if needed - CBC with Differential/Platelet; Future - Hemoglobin A1c; Future - Lipid panel; Future - TSH; Future - CMP with eGFR([facility]); Future - CMP with eGFR([facility]) - TSH - Lipid panel - Hemoglobin A1c - CBC with Differential/Platelet  2. Hypothyroidism, unspecified type -  Consider dose increase in synthroid  - CBC with Differential/Platelet; Future - Hemoglobin A1c; Future - Lipid panel; Future - TSH; Future - CMP with eGFR([facility]); Future - CMP with eGFR([facility]) - TSH - Lipid panel - Hemoglobin A1c - CBC with Differential/Platelet  3. Essential hypertension - Well controlled. No change in medications  - CBC with Differential/Platelet; Future - Hemoglobin A1c; Future - Lipid panel; Future - TSH; Future - CMP with eGFR([facility]); Future - CMP with eGFR([facility]) - TSH - Lipid panel - Hemoglobin A1c - CBC with Differential/Platelet  4. Elevated glucose level  - CBC with Differential/Platelet; Future - Hemoglobin A1c; Future - Lipid panel; Future - TSH; Future - CMP with eGFR([facility]); Future - CMP with eGFR([facility]) - TSH - Lipid panel - Hemoglobin A1c - CBC with Differential/Platelet  5. BPH associated with nocturia  - PSA; Future - PSA  6. Need for immunization against influenza  - Flu Vaccine QUAD High Dose(Fluad)  7. Colon cancer screening -We will order Cologuard  8. Chronic left shoulder pain -We discussed various treatment options and he is interested in seeing sports medicine. Will refer over there. I am hopeful that prescribing trazodone to help him sleep will also help with his chronic left shoulder pain - Ambulatory referral to Sports Medicine  9. Sleep disturbance  - traZODone (DESYREL) 50 MG tablet; Take 1 tablet (50 mg total) by mouth at bedtime as needed for sleep.  Dispense: 90 tablet; Refill: 0  [name], NP

## 2020-01-26 NOTE — Telephone Encounter (Signed)
Per PCP order completed for a Cologuard.  Order form, along with demographics info was faxed to [employer] at [phone].

## 2020-01-27 ENCOUNTER — Telehealth: Payer: Self-pay | Admitting: Adult Health

## 2020-01-27 LAB — COMPLETE METABOLIC PANEL WITH GFR
AG Ratio: 2.2 (calc) (ref 1.0–2.5)
ALT: 35 U/L (ref 9–46)
AST: 31 U/L (ref 10–35)
Albumin: 4.8 g/dL (ref 3.6–5.1)
Alkaline phosphatase (APISO): 70 U/L (ref 35–144)
BUN/Creatinine Ratio: 25 (calc) — ABNORMAL HIGH (ref 6–22)
BUN: 29 mg/dL — ABNORMAL HIGH (ref 7–25)
CO2: 23 mmol/L (ref 20–32)
Calcium: 10.1 mg/dL (ref 8.6–10.3)
Chloride: 102 mmol/L (ref 98–110)
Creat: 1.14 mg/dL (ref 0.70–1.18)
GFR, Est African American: 74 mL/min/{1.73_m2} (ref >=60)
GFR, Est Non African American: 64 mL/min/{1.73_m2} (ref >=60)
Globulin: 2.2 g/dL (calc) (ref 1.9–3.7)
Glucose, Bld: 122 mg/dL — ABNORMAL HIGH (ref 65–99)
Potassium: 4.3 mmol/L (ref 3.5–5.3)
Sodium: 140 mmol/L (ref 135–146)
Total Bilirubin: 0.9 mg/dL (ref 0.2–1.2)
Total Protein: 7 g/dL (ref 6.1–8.1)

## 2020-01-27 LAB — LIPID PANEL
Cholesterol: 192 mg/dL (ref <200)
HDL: 38 mg/dL — ABNORMAL LOW (ref >=40)
LDL Cholesterol (Calc): 122 mg/dL (calc) — ABNORMAL HIGH
Non-HDL Cholesterol (Calc): 154 mg/dL (calc) — ABNORMAL HIGH (ref <130)
Total CHOL/HDL Ratio: 5.1 (calc) — ABNORMAL HIGH (ref <5.0)
Triglycerides: 202 mg/dL — ABNORMAL HIGH (ref <150)

## 2020-01-27 LAB — CBC WITH DIFFERENTIAL/PLATELET
Absolute Monocytes: 714 cells/uL (ref 200–950)
Basophils Absolute: 86 cells/uL (ref 0–200)
Basophils Relative: 1 %
Eosinophils Absolute: 189 cells/uL (ref 15–500)
Eosinophils Relative: 2.2 %
HCT: 47.4 % (ref 38.5–50.0)
Hemoglobin: 16.2 g/dL (ref 13.2–17.1)
Lymphs Abs: 1548 cells/uL (ref 850–3900)
MCH: 31.7 pg (ref 27.0–33.0)
MCHC: 34.2 g/dL (ref 32.0–36.0)
MCV: 92.8 fL (ref 80.0–100.0)
MPV: 10.3 fL (ref 7.5–12.5)
Monocytes Relative: 8.3 %
Neutro Abs: 6063 cells/uL (ref 1500–7800)
Neutrophils Relative %: 70.5 %
Platelets: 236 10*3/uL (ref 140–400)
RBC: 5.11 10*6/uL (ref 4.20–5.80)
RDW: 13.1 % (ref 11.0–15.0)
Total Lymphocyte: 18 %
WBC: 8.6 10*3/uL (ref 3.8–10.8)

## 2020-01-27 LAB — HEMOGLOBIN A1C
Hgb A1c MFr Bld: 6.4 % of total Hgb — ABNORMAL HIGH (ref <5.7)
Mean Plasma Glucose: 137 (calc)
eAG (mmol/L): 7.6 (calc)

## 2020-01-27 LAB — TSH: TSH: 9.42 mIU/L — ABNORMAL HIGH (ref 0.40–4.50)

## 2020-01-27 LAB — PSA: PSA: 1.38 ng/mL (ref <=4.0)

## 2020-01-27 MED ORDER — LEVOTHYROXINE SODIUM 175 MCG PO TABS: 175.0000 ug | ORAL_TABLET | Freq: Every day | ORAL | 1 refills | Status: DC

## 2020-01-27 MED ORDER — METFORMIN HCL 500 MG PO TABS: 500.0000 mg | ORAL_TABLET | Freq: Every day | ORAL | 1 refills | Status: DC

## 2020-01-27 NOTE — Telephone Encounter (Signed)
Spoke to the patient and informed him of his labs.  His TSH was elevated I am going to increase his Synthroid dose.  His A1c was also 6.4, will put him on Metformin 500 mg daily and have him work on lifestyle modifications, follow-up in 6 months.  Cholesterol panel was also elevated over the last year, but he did have breakfast prior to coming to the office.  We will retest that in 6 months

## 2020-02-07 ENCOUNTER — Telehealth: Payer: Self-pay | Admitting: Adult Health

## 2020-02-07 NOTE — Telephone Encounter (Signed)
Left message to get patient scheduled for Medicare Wellness visit with me.

## 2020-02-08 ENCOUNTER — Encounter: Payer: Self-pay | Admitting: Family Medicine

## 2020-02-08 ENCOUNTER — Ambulatory Visit: Payer: Self-pay

## 2020-02-08 ENCOUNTER — Ambulatory Visit (INDEPENDENT_AMBULATORY_CARE_PROVIDER_SITE_OTHER): Payer: Medicare HMO

## 2020-02-08 ENCOUNTER — Ambulatory Visit: Payer: Medicare HMO | Admitting: Family Medicine

## 2020-02-08 ENCOUNTER — Other Ambulatory Visit: Payer: Self-pay

## 2020-02-08 VITALS — BP 130/80 | HR 34 | Ht 68.0 in | Wt 191.8 lb

## 2020-02-08 DIAGNOSIS — G8929 Other chronic pain: Secondary | ICD-10-CM | POA: Diagnosis not present

## 2020-02-08 DIAGNOSIS — M25512 Pain in left shoulder: Secondary | ICD-10-CM

## 2020-02-08 DIAGNOSIS — R001 Bradycardia, unspecified: Secondary | ICD-10-CM | POA: Diagnosis not present

## 2020-02-08 NOTE — Patient Instructions (Signed)
Thank you for coming in today.  I've referred you to Physical Therapy.  Let us know if you don't hear from them in one week.  Call or go to the ER if you develop a large red swollen joint with extreme pain or oozing puss.   Recheck in 6-8 weeks.   Let me know if you have a problem.   I will send Tommi Rumps a message about your HR and BP.    Shoulder Impingement Syndrome Rehab Ask your health care provider which exercises are safe for you. Do exercises exactly as told by your health care provider and adjust them as directed. It is normal to feel mild stretching, pulling, tightness, or discomfort as you do these exercises. Stop right away if you feel sudden pain or your pain gets worse. Do not begin these exercises until told by your health care provider. Stretching and range-of-motion exercise This exercise warms up your muscles and joints and improves the movement and flexibility of your shoulder. This exercise also helps to relieve pain and stiffness. Passive horizontal adduction In passive adduction, you use your other hand to move the injured arm toward your body. The injured arm does not move on its own. In this movement, your arm is moved across your body in the horizontal plane (horizontal adduction). 1. Sit or stand and pull your left / right elbow across your chest, toward your other shoulder. Stop when you feel a gentle stretch in the back of your shoulder and upper arm. ? Keep your arm at shoulder height. ? Keep your arm as close to your body as you comfortably can. 2. Hold for __________ seconds. 3. Slowly return to the starting position. Repeat __________ times. Complete this exercise __________ times a day. Strengthening exercises These exercises build strength and endurance in your shoulder. Endurance is the ability to use your muscles for a long time, even after they get tired. External rotation, isometric This is an exercise in which you press the back of your wrist against a  door frame without moving your shoulder joint (isometric). 1. Stand or sit in a doorway, facing the door frame. 2. Bend your left / right elbow and place the back of your wrist against the door frame. Only the back of your wrist should be touching the frame. Keep your upper arm at your side. 3. Gently press your wrist against the door frame, as if you are trying to push your arm away from your abdomen (external rotation). Press as hard as you are able without pain. ? Avoid shrugging your shoulder while you press your wrist against the door frame. Keep your shoulder blade tucked down toward the middle of your back. 4. Hold for __________ seconds. 5. Slowly release the tension, and relax your muscles completely before you repeat the exercise. Repeat __________ times. Complete this exercise __________ times a day. Internal rotation, isometric This is an exercise in which you press your palm against a door frame without moving your shoulder joint (isometric). 1. Stand or sit in a doorway, facing the door frame. 2. Bend your left / right elbow and place the palm of your hand against the door frame. Only your palm should be touching the frame. Keep your upper arm at your side. 3. Gently press your hand against the door frame, as if you are trying to push your arm toward your abdomen (internal rotation). Press as hard as you are able without pain. ? Avoid shrugging your shoulder while you press your hand  against the door frame. Keep your shoulder blade tucked down toward the middle of your back. 4. Hold for __________ seconds. 5. Slowly release the tension, and relax your muscles completely before you repeat the exercise. Repeat __________ times. Complete this exercise __________ times a day. Scapular protraction, supine  1. Lie on your back on a firm surface (supine position). Hold a __________ weight in your left / right hand. 2. Raise your left / right arm straight into the air so your hand is  directly above your shoulder joint. 3. Push the weight into the air so your shoulder (scapula) lifts off the surface that you are lying on. The scapula will push up or forward (protraction). Do not move your head, neck, or back. 4. Hold for __________ seconds. 5. Slowly return to the starting position. Let your muscles relax completely before you repeat this exercise. Repeat __________ times. Complete this exercise __________ times a day. Scapular retraction  1. Sit in a stable chair without armrests, or stand up. 2. Secure an exercise band to a stable object in front of you so the band is at shoulder height. 3. Hold one end of the exercise band in each hand. Your palms should face down. 4. Squeeze your shoulder blades together (retraction) and move your elbows slightly behind you. Do not shrug your shoulders upward while you do this. 5. Hold for __________ seconds. 6. Slowly return to the starting position. Repeat __________ times. Complete this exercise __________ times a day. Shoulder extension  1. Sit in a stable chair without armrests, or stand up. 2. Secure an exercise band to a stable object in front of you so the band is above shoulder height. 3. Hold one end of the exercise band in each hand. 4. Straighten your elbows and lift your hands up to shoulder height. 5. Squeeze your shoulder blades together and pull your hands down to the sides of your thighs (extension). Stop when your hands are straight down by your sides. Do not let your hands go behind your body. 6. Hold for __________ seconds. 7. Slowly return to the starting position. Repeat __________ times. Complete this exercise __________ times a day. This information is not intended to replace advice given to you by your health care provider. Make sure you discuss any questions you have with your health care provider. Document Revised: 07/30/2018 Document Reviewed: 05/03/2018 Elsevier Patient Education  [place].

## 2020-02-08 NOTE — Progress Notes (Signed)
Subjective:    CC: L shoulder pain  I, Jose Evans, LAT, ATC, am serving as scribe for Dr. Lynne Leader.  HPI: Pt is a 74 y/o male presenting w/ c/o L shoulder pain x approximately 10 years w/ no known MOI.  He was seen by his PCP in Aug 2021 about these c/o and had a L shoulder injection.  He locates his pain to his L anterior shoulder.  He has been doing some overhead motion and activity and notes pain with these activities somewhat.  His biggest issue is pain with sleeping the does interfere with sleep.  Additionally he notes that he has been told he has a low heart rate previously.  He denies frank syncope currently but does note some dizziness at times with prolonged standing.  Chest pain or significant palpitations.  Radiating pain: yes into his L proximal upper arm L shoulder mechanical symptoms: yes occasionally Aggravating factors: L shoulder overhead AROM; L sidelying position Treatments tried: L shoulder corticosteroid injection; Voltraen gel  Pertinent review of Systems: No fevers or chills  Relevant historical information: Hypertension, hypothyroidism,   Objective:    Vitals:   02/08/20 0846  BP: 130/80  Pulse: (!) 34  SpO2: 96%   General: Well Developed, well nourished, and in no acute distress.  CV: Bradycardia palpated manually at 40-45 bpm MSK: Left shoulder sits higher than right with some scapular protraction.  Otherwise normal-appearing Nontender. Normal motion with excellent abduction internal rotation and external rotation. Strength diminished to abduction and external rotation 4/5.  Normal internal rotation 5/5. Positive empty can test. Positive Hawkins and Neer's test. Negative Yergason's and speeds test. Pulses cap refill and sensation are intact distally.  Lab and Radiology Results  Procedure: Real-time Ultrasound Guided Injection of left shoulder subacromial bursa Device: Philips Affiniti 50G Images permanently stored and available for  review in PACS Ultrasound examination of the shoulder prior to injection reveals thinned rotator cuff tendons at subscapularis supraspinatus and infraspinatus with no clear full-thickness rotator cuff tear with retraction.  Subacromial bursitis also present. Verbal informed consent obtained.  Discussed risks and benefits of procedure. Warned about infection bleeding damage to structures skin hypopigmentation and fat atrophy among others. Patient expresses understanding and agreement Time-out conducted.   Noted no overlying erythema, induration, or other signs of local infection.   Skin prepped in a sterile fashion.   Local anesthesia: Topical Ethyl chloride.   With sterile technique and under real time ultrasound guidance:  40 mg of Kenalog and 2 mL of Marcaine injected into subacromial bursa. Fluid seen entering the bursa.   Completed without difficulty   Pain immediately resolved suggesting accurate placement of the medication.   Advised to call if fevers/chills, erythema, induration, drainage, or persistent bleeding.   Images permanently stored and available for review in the ultrasound unit.  Impression: Technically successful ultrasound guided injection.   X-ray images left shoulder obtained today personally and independently interpreted. No severe glenohumeral DJD.  High riding humeral head compared to glenoid. Mild AC DJD present. No acute fractures. Await formal radiology review.   EKG September 2019 personally independently interpreted today.  Sinus bradycardia heart rate 51.  Impression and Recommendations:    Assessment and Plan: 74 y.o. male with left shoulder pain ongoing for years.  Suspect partial-thickness rotator cuff tear but no full-thickness retracted tear is seen on ultrasound examination today.  Patient does have evidence of scapular dyskinesis as well on exam.  Plan for injection today and referral to  physical therapy.  If not improving next step would likely be MRI  to further characterize pain and for potential surgical planning.  Patient would like to avoid surgery I think is reasonable.    Bradycardia: Not a new issue today and patient is not very symptomatic today.  However this should be addressed in the near future with his PCP.  He does take metoprolol 50 mg daily which could potentially be decreased.  Sent message to PCP and will defer to his judgment from here.[name]  PDMP not reviewed this encounter. Orders Placed This Encounter  Procedures  . [place] LIMITED JOINT SPACE STRUCTURES UP LEFT(NO LINKED CHARGES)    Order Specific Question:   Reason for Exam (SYMPTOM  OR DIAGNOSIS REQUIRED)    Answer:   L shoulder pain    Order Specific Question:   Preferred imaging location?    Answer:   [facility] [place]  . DG Shoulder Left    Standing Status:   Future    Number of Occurrences:   1    Standing Expiration Date:   03/10/2020    Order Specific Question:   Reason for Exam (SYMPTOM  OR DIAGNOSIS REQUIRED)    Answer:   L shoulder pain    Order Specific Question:   Preferred imaging location?    Answer:   [name]  . Ambulatory referral to Physical Therapy    Referral Priority:   Routine    Referral Type:   Physical Medicine    Referral Reason:   Specialty Services Required    Requested Specialty:   Physical Therapy   No orders of the defined types were placed in this encounter.   Discussed warning signs or symptoms. Please see discharge instructions. Patient expresses understanding.   The above documentation has been reviewed and is accurate and complete Lynne Leader, M.D.

## 2020-02-08 NOTE — Addendum Note (Signed)
Addended by: Douglass Rivers T on: 02/08/2020 10:46 AM   Modules accepted: Orders

## 2020-02-09 ENCOUNTER — Encounter (HOSPITAL_COMMUNITY): Payer: Self-pay

## 2020-02-09 ENCOUNTER — Ambulatory Visit (HOSPITAL_COMMUNITY): Payer: Medicare HMO | Attending: Family Medicine

## 2020-02-09 DIAGNOSIS — G8929 Other chronic pain: Secondary | ICD-10-CM | POA: Diagnosis not present

## 2020-02-09 DIAGNOSIS — R29898 Other symptoms and signs involving the musculoskeletal system: Secondary | ICD-10-CM | POA: Diagnosis not present

## 2020-02-09 DIAGNOSIS — M25512 Pain in left shoulder: Secondary | ICD-10-CM | POA: Insufficient documentation

## 2020-02-09 DIAGNOSIS — M25612 Stiffness of left shoulder, not elsewhere classified: Secondary | ICD-10-CM | POA: Insufficient documentation

## 2020-02-09 NOTE — Addendum Note (Signed)
Addended by: Ailene Ravel D on: 02/09/2020 03:52 PM   Modules accepted: Orders

## 2020-02-09 NOTE — Progress Notes (Signed)
Left shoulder x-ray shows some narrowing of the space for the rotator cuff has to be through but otherwise looks pretty normal to radiology

## 2020-02-09 NOTE — Patient Instructions (Signed)
Complete the following exercises 2-3 times a day. Hold each stretch for at least 5-10 seconds.  pec corner stretch  Find a comfortable position for your hands and lightly lean forward into the corner until you feel a good stretch in your pecs.     Posterior Capsule Stretch   Stand or sit, one arm across body so hand rests over opposite shoulder. Gently push on crossed elbow with other hand until stretch is felt in shoulder of crossed arm.    Scalene Stretch, Sitting    Copyright  VHI. All rights reserved.  Scalene Stretch, Sitting   Sit, one hand tucked under hip on side to be stretched, other hand over top of head. Gently pull head to side and backwards. For more stretch, lean body in direction of head pull.

## 2020-02-09 NOTE — Therapy (Addendum)
[place], [place], [address] Phone: [phone]   Fax:  [phone]  Occupational Therapy Evaluation  Patient Details  Name: Jose Evans MRN: [id number] Date of Birth: 01-25-1946 Referring Provider (OT): Lynne Leader, MD   Encounter Date: 02/09/2020   OT End of Session - 02/09/20 1132    Visit Number 1    Number of Visits 8    Date for OT Re-Evaluation 03/08/20    Authorization Type [employer] HMO    Authorization Time Period Copay $35, No visit limit    Progress Note Due on Visit 10    OT Start Time 0900    OT Stop Time 0950    OT Time Calculation (min) 50 min    Activity Tolerance Patient tolerated treatment well    Behavior During Therapy [facility] for tasks assessed/performed           Past Medical History:  Diagnosis Date  . Adult acne   . Cancer ([place]) 07/2017   skin cancer  . Gout   . Hypertension   . Thyroid disease     Past Surgical History:  Procedure Laterality Date  . HERNIA REPAIR    . SHOULDER SURGERY    . TONSILLECTOMY      There were no vitals filed for this visit.   Subjective Assessment - 02/09/20 0903    Subjective  S: I've been dealing with this for a long time, just now got sick of it    Pertinent History Patient is a 74 y/o male s/p left chronic shoulder pain who is referred by Dr. Georgina Snell for OT evaluation and treatment. Patient reports the pain as an ongoing problem for the past 10 years and just now decided to do something about it. He reports that he went to the doctor yesterday with Dr. Georgina Snell indicating he suspects a partial thickness RC tear, as well as identified irregular alignment of shoudler joint. Patient reports he received a steroid injection, which has seemed to help with the pain, and desires to try therapy to alleviate the shoulder pain versus jumping straight to surgery.    Special Tests complete next session    Patient Stated Goals Get some strength back, cut  the pain down    Currently in Pain? Yes    Pain Score 1    1 right now, 7 or 8 at it's worst   Pain Location Shoulder    Pain Orientation Left    Pain Descriptors / Indicators Sore;Sharp;Dull    Pain Type Chronic pain    Pain Radiating Towards Down towards elbow    Pain Onset More than a month ago    Pain Frequency Intermittent    Aggravating Factors  Movement especially overhead. overuse    Pain Relieving Factors Stretching, topical cream    Effect of Pain on Daily Activities mod effect    Multiple Pain Sites No             [facility] OT Assessment - 02/09/20 0910      Assessment   Medical Diagnosis Chronic left shoulder pain, with scapular dyskinesis and partial thickness RC tear    Referring Provider (OT) Lynne Leader, MD    Onset Date/Surgical Date --   pt reports about 10 years ago   Hand Dominance Right    Next MD Visit 03/28/20    Prior Therapy None for this shoulder      Precautions   Precautions None  Restrictions   Weight Bearing Restrictions No      Balance Screen   Has the patient fallen in the past 6 months Yes    How many times? 1    Has the patient had a decrease in activity level because of a fear of falling?  No    Is the patient reluctant to leave their home because of a fear of falling?  No      Home  Environment   Lives With Spouse      Prior Function   Level of Independence Independent    Vocation Retired    Leisure working on [employer], antique work, gardening      ADL   ADL comments Patient reports difficulty with sleeping throughout the night, overhead movements, manual labor around the house      ROM / Strength   AROM / PROM / Strength AROM;PROM;Strength      Palpation   Palpation comment Min-mod fascial restrictions palpated on LUE trapezius and scapularis regions      AROM   Overall AROM Comments Assessed seated, IR/er adducted    AROM Assessment Site Shoulder    Right/Left Shoulder Left    Left Shoulder Flexion 144 Degrees    Left  Shoulder ABduction 147 Degrees    Left Shoulder Internal Rotation 90 Degrees    Left Shoulder External Rotation 25 Degrees      PROM   Overall PROM Comments Assessed supine, IR/er adducted    PROM Assessment Site Shoulder    Right/Left Shoulder Left    Left Shoulder Flexion 160 Degrees    Left Shoulder ABduction 155 Degrees    Left Shoulder Internal Rotation 90 Degrees    Left Shoulder External Rotation 60 Degrees      Strength   Overall Strength Comments Assessed seated, IR/er adducted    Strength Assessment Site Shoulder    Right/Left Shoulder Left    Left Shoulder Flexion 4+/5    Left Shoulder ABduction 4+/5    Left Shoulder Internal Rotation 5/5    Left Shoulder External Rotation 4-/5                           OT Education - 02/09/20 1155    Education Details Stretches for shoulder, pectoralis, and trapezius.    Person(s) Educated Patient    Methods Explanation;Verbal cues;Handout    Comprehension Verbalized understanding;Returned demonstration;Verbal cues required            OT Short Term Goals - 02/09/20 1230      OT SHORT TERM GOAL #1   Title Patient will be provided and educated and independent with HEP in order to improve LUE function in desired daily tasks.    Time 4    Period Weeks    Status New    Target Date 03/08/20      OT SHORT TERM GOAL #2   Title Patient will decrease fascial restrictions of the left trapezius and scapular regions to a trace amount in order to promote mobility in a pain free zone.    Time 4    Period Weeks    Status New      OT SHORT TERM GOAL #3   Title Patient will decrease pain in LUE to at least a 3/10 or less in order to allow patient to sleep uninterrupted for at least 4 hours consecutively throughout the night.    Time 4    Period Weeks  Status New      OT SHORT TERM GOAL #4   Title Patient will improve ROM, both actively and passively during ER of LUE to improve mobility needed for completing tasks  at home involving manual labor that requires ER the shoulder.    Time 4    Period Weeks    Status New      OT SHORT TERM GOAL #5   Title Patient will improve strength of LUE to at least a 5/5 in order to allow patient to complete repetitive overhead movements with less instability and pain reported.    Time 4    Period Weeks    Status New      Additional Short Term Goals   Additional Short Term Goals Yes      OT SHORT TERM GOAL #6   Title --    Time --    Period --    Status --                    Plan - 02/09/20 1138    Clinical Impression Statement A: Patient is a 74 y/o male, s/p left chronic shoulder pain who has increased fascial restrictions causing increased pain and decreased P/ROM, A/ROM and strength, which affects patient's ability to participate in overhead movements and house work activites pain free, as well as to sleep uninterrupted throughout the night.    OT Occupational Profile and History Problem Focused Assessment - Including review of records relating to presenting problem    Occupational performance deficits (Please refer to evaluation for details): IADL's;Rest and Sleep;Leisure;Other;ADL's    Body Structure / Function / Physical Skills Endurance;Muscle spasms;UE functional use;Fascial restriction;Body mechanics;Pain;ROM;IADL;Strength;ADL    Rehab Potential Good    Clinical Decision Making Several treatment options, min-mod task modification necessary    Comorbidities Affecting Occupational Performance: May have comorbidities impacting occupational performance    Modification or Assistance to Complete Evaluation  Min-Moderate modification of tasks or assist with assess necessary to complete eval    OT Frequency 2x / week    OT Duration 4 weeks    OT Treatment/Interventions Ultrasound;Iontophoresis;Patient/family education;Passive range of motion;Traction;Cryotherapy;Electrical Stimulation;Moist Heat;Therapeutic exercise;Manual Therapy;Therapeutic  activities    Plan P: Patient will benefit from skilled OT intervention to decrease fascial restrictions, decrease pain, and increase P/ROM in a pain free zone, as well as increasing A/ROM and strength. Treatment plan: manual techniques, passive stretches, A/ROM, scapular strengthening, focus on correcting imbalances of the trapezius muscles through trapezius mobility and strengthening, decreasing stiffness of pectoralis muscle, modalities prn. Next session: FOTO, test middle trapezius strength while prone, t-spine mobility, pectoralis stretching, isometrics    OT Home Exercise Plan Eval 10/21: Corner and cross body stretch, lateral flexion neck stretch,    Consulted and Agree with Plan of Care Patient           Patient will benefit from skilled therapeutic intervention in order to improve the following deficits and impairments:   Body Structure / Function / Physical Skills: Endurance, Muscle spasms, UE functional use, Fascial restriction, Body mechanics, Pain, ROM, IADL, Strength, ADL       Visit Diagnosis: Chronic left shoulder pain  Other symptoms and signs involving the musculoskeletal system  Stiffness of left shoulder, not elsewhere classified    Problem List Patient Active Problem List   Diagnosis Date Noted  . Peripheral neuropathy 07/26/2019  . Generalized abdominal pain 12/30/2017  . Chronic right shoulder pain 07/13/2017  . Spinal stenosis of lumbar region with neurogenic  claudication 01/13/2017  . Routine general medical examination at a health care facility 05/28/2015  . Capillary hemangioma of skin 03/13/2014  . Dysplastic nevus of face 07/01/2011  . SEBORRHEIC KERATOSIS, INFLAMED 05/06/2010  . Attention deficit disorder 11/21/2009  . Hypothyroidism 12/01/2006  . Gout 12/01/2006  . Essential hypertension 12/01/2006    [place], [place] Student [phone]   02/09/2020, 3:28 PM  [place], [place], [address] Phone: [phone]   Fax:  [phone]  Name: Jose Evans MRN: [id number] Date of Birth: Feb 13, 1946

## 2020-02-14 ENCOUNTER — Ambulatory Visit (HOSPITAL_COMMUNITY): Payer: Medicare HMO

## 2020-02-20 ENCOUNTER — Ambulatory Visit (HOSPITAL_COMMUNITY): Payer: Medicare HMO | Attending: Family Medicine

## 2020-02-20 ENCOUNTER — Other Ambulatory Visit: Payer: Self-pay

## 2020-02-20 ENCOUNTER — Encounter (HOSPITAL_COMMUNITY): Payer: Self-pay

## 2020-02-20 ENCOUNTER — Other Ambulatory Visit: Payer: Self-pay | Admitting: Adult Health

## 2020-02-20 DIAGNOSIS — M25612 Stiffness of left shoulder, not elsewhere classified: Secondary | ICD-10-CM | POA: Diagnosis not present

## 2020-02-20 DIAGNOSIS — G8929 Other chronic pain: Secondary | ICD-10-CM | POA: Diagnosis not present

## 2020-02-20 DIAGNOSIS — M25512 Pain in left shoulder: Secondary | ICD-10-CM | POA: Diagnosis not present

## 2020-02-20 DIAGNOSIS — R29898 Other symptoms and signs involving the musculoskeletal system: Secondary | ICD-10-CM | POA: Insufficient documentation

## 2020-02-20 DIAGNOSIS — R351 Nocturia: Secondary | ICD-10-CM

## 2020-02-20 DIAGNOSIS — N401 Enlarged prostate with lower urinary tract symptoms: Secondary | ICD-10-CM

## 2020-02-20 NOTE — Patient Instructions (Signed)
Complete at least once a day, 10 reps.   Open book stretch    Lay down on one side with both knees bent up towards the chest at a 90 degree angle. Open up the top arm and reach back until you feel a stretch, forming a "T" shape with your arms. Hold for 10 seconds then close back. Follow your arm with your head as you open and close. Repeat on other side as well.   Genie stretch     Bring arm across body at chest height and with other hand grab elbow from the top and pull arm across chest.   Scapular pinches    Tuck chin back as you pinch shoulder blades together.  Hold 5 seconds.

## 2020-02-20 NOTE — Therapy (Signed)
[place], [place], [address] Phone: [phone]   Fax:  [phone]  Occupational Therapy Treatment  Patient Details  Name: Jose Evans MRN: [id number] Date of Birth: 03/23/1946 Referring Provider (OT): Lynne Leader, MD   Encounter Date: 02/20/2020   OT End of Session - 02/20/20 1035    Visit Number 2    Number of Visits 8    Date for OT Re-Evaluation 03/08/20    Authorization Type [employer] Medicare HMO    Authorization Time Period Copay $35, No visit limit    Progress Note Due on Visit 10    OT Start Time 1030    OT Stop Time 1113    OT Time Calculation (min) 43 min    Activity Tolerance Patient tolerated treatment well    Behavior During Therapy [facility] for tasks assessed/performed           Past Medical History:  Diagnosis Date  . Adult acne   . Cancer ([place]) 07/2017   skin cancer  . Gout   . Hypertension   . Thyroid disease     Past Surgical History:  Procedure Laterality Date  . HERNIA REPAIR    . SHOULDER SURGERY    . TONSILLECTOMY      There were no vitals filed for this visit.   Subjective Assessment - 02/20/20 1032    Subjective  S: My shoulder popped in the middle of the night and it caused a lot of pain              [facility] OT Assessment - 02/20/20 1047      Assessment   Medical Diagnosis Chronic left shoulder pain, with scapular dyskinesis and partial thickness RC tear      Precautions   Precautions None      Observation/Other Assessments   Focus on Therapeutic Outcomes (FOTO)  66/100      ROM / Strength   AROM / PROM / Strength Strength      Strength   Overall Strength Comments Assessed prone. Middle trapezius 4-/5                    OT Treatments/Exercises (OP) - 02/20/20 1058      Exercises   Exercises Shoulder      Shoulder Exercises: Standing   Retraction AROM;10 reps   5" holds     Shoulder Exercises: Stretch   Other Shoulder Stretches Supine open  book stretch, 3" hold, 10X each    Other Shoulder Stretches Cross body stretch on left, using right hand to support weight of left elbow. 5" hold, 10X on left side                  OT Education - 02/20/20 1152    Education Details Reviewed goals, open book and genie stretches    Person(s) Educated Patient    Methods Explanation;Handout;Verbal cues    Comprehension Verbalized understanding;Returned demonstration;Verbal cues required            OT Short Term Goals - 02/20/20 1037      OT SHORT TERM GOAL #1   Title Patient will be provided and educated and independent with HEP in order to improve LUE function in desired daily tasks.    Time 4    Period Weeks    Status On-going    Target Date 03/08/20      OT SHORT TERM GOAL #2   Title Patient will decrease  fascial restrictions of the left trapezius and scapular regions to a trace amount in order to promote mobility in a pain free zone.    Time 4    Period Weeks    Status On-going      OT SHORT TERM GOAL #3   Title Patient will decrease pain in LUE to at least a 3/10 or less in order to allow patient to sleep uninterrupted for at least 4 hours consecutively throughout the night.    Time 4    Period Weeks    Status On-going      OT SHORT TERM GOAL #4   Title Patient will improve ROM, both actively and passively during ER of LUE to improve mobility needed for completing tasks at home involving manual labor that requires ER the shoulder.    Time 4    Period Weeks    Status On-going      OT SHORT TERM GOAL #5   Title Patient will improve strength of LUE to at least a 5/5 in order to allow patient to complete repetitive overhead movements with less instability and pain reported.    Time 4    Period Weeks    Status On-going                    Plan - 02/20/20 1140    Clinical Impression Statement A: Patient presents with middle trapezius weakness while tested prone. Focus of session on scapular and thoracic  spine mobility to correct imbalances of the scapular and trapezius regions. Completed open book stretch supine, scapular stretch and scapular squeezes. VC required for form and technique.    Body Structure / Function / Physical Skills Endurance;Muscle spasms;UE functional use;Fascial restriction;Body mechanics;Pain;ROM;IADL;Strength;ADL    Plan P: Follow up on HEP, continue t-spine mobility, ret/dep, begin light strengthening    Consulted and Agree with Plan of Care Patient           Patient will benefit from skilled therapeutic intervention in order to improve the following deficits and impairments:   Body Structure / Function / Physical Skills: Endurance, Muscle spasms, UE functional use, Fascial restriction, Body mechanics, Pain, ROM, IADL, Strength, ADL       Visit Diagnosis: Chronic left shoulder pain  Other symptoms and signs involving the musculoskeletal system  Stiffness of left shoulder, not elsewhere classified    Problem List Patient Active Problem List   Diagnosis Date Noted  . Peripheral neuropathy 07/26/2019  . Generalized abdominal pain 12/30/2017  . Chronic right shoulder pain 07/13/2017  . Spinal stenosis of lumbar region with neurogenic claudication 01/13/2017  . Routine general medical examination at a health care facility 05/28/2015  . Capillary hemangioma of skin 03/13/2014  . Dysplastic nevus of face 07/01/2011  . SEBORRHEIC KERATOSIS, INFLAMED 05/06/2010  . Attention deficit disorder 11/21/2009  . Hypothyroidism 12/01/2006  . Gout 12/01/2006  . Essential hypertension 12/01/2006    [place], [place] Student [phone]    02/20/2020, 11:54 AM  [place] [address] [place], [place], [address] Phone: [phone]   Fax:  [phone]  Name: Jose Evans MRN: [id number] Date of Birth: 1945/05/11

## 2020-02-22 ENCOUNTER — Telehealth (HOSPITAL_COMMUNITY): Payer: Self-pay

## 2020-02-22 ENCOUNTER — Ambulatory Visit (HOSPITAL_COMMUNITY): Payer: Medicare HMO

## 2020-02-22 NOTE — Telephone Encounter (Signed)
Called patient regarding no show appointment. Patient expressed that he cancelled via the telephone calling system when he received a phone call regarding his upcoming appointment. Automated system informed him that someone would call him regarding cancellation. Informed patient that I would talk with the front desk staff and see if they missed the notification.  Patient was reminded of his next therapy appointment and will notify use if he is unable to make it.   Ailene Ravel, OTR/L,CBIS  [phone]

## 2020-02-27 ENCOUNTER — Telehealth (HOSPITAL_COMMUNITY): Payer: Self-pay

## 2020-02-27 NOTE — Telephone Encounter (Signed)
pt stated that he wants to cx all of his appts due to he is going back to work and does not know his schedule.

## 2020-02-29 ENCOUNTER — Ambulatory Visit (HOSPITAL_COMMUNITY): Payer: Medicare HMO

## 2020-03-01 ENCOUNTER — Other Ambulatory Visit: Payer: Self-pay

## 2020-03-01 ENCOUNTER — Encounter: Payer: Self-pay | Admitting: Adult Health

## 2020-03-01 ENCOUNTER — Ambulatory Visit (INDEPENDENT_AMBULATORY_CARE_PROVIDER_SITE_OTHER): Payer: Medicare HMO | Admitting: Adult Health

## 2020-03-01 VITALS — BP 140/80 | HR 64 | Temp 98.5°F | Ht 68.0 in | Wt 186.4 lb

## 2020-03-01 DIAGNOSIS — K047 Periapical abscess without sinus: Secondary | ICD-10-CM

## 2020-03-01 DIAGNOSIS — L739 Follicular disorder, unspecified: Secondary | ICD-10-CM | POA: Diagnosis not present

## 2020-03-01 DIAGNOSIS — R001 Bradycardia, unspecified: Secondary | ICD-10-CM

## 2020-03-01 DIAGNOSIS — E039 Hypothyroidism, unspecified: Secondary | ICD-10-CM

## 2020-03-01 DIAGNOSIS — A630 Anogenital (venereal) warts: Secondary | ICD-10-CM

## 2020-03-01 DIAGNOSIS — R69 Illness, unspecified: Secondary | ICD-10-CM | POA: Diagnosis not present

## 2020-03-01 LAB — TSH: TSH: 1.01 mIU/L (ref 0.40–4.50)

## 2020-03-01 LAB — T3, FREE: T3, Free: 3.1 pg/mL (ref 2.3–4.2)

## 2020-03-01 LAB — T4, FREE: Free T4: 1.9 ng/dL — ABNORMAL HIGH (ref 0.8–1.8)

## 2020-03-01 MED ORDER — METOPROLOL TARTRATE 25 MG PO TABS: 25.0000 mg | ORAL_TABLET | Freq: Every day | ORAL | 3 refills | Status: DC

## 2020-03-01 MED ORDER — AMOXICILLIN 500 MG PO CAPS: 500.0000 mg | ORAL_CAPSULE | Freq: Two times a day (BID) | ORAL | 0 refills | Status: AC

## 2020-03-01 MED ORDER — PODOFILOX 0.5 % EX SOLN: Freq: Two times a day (BID) | CUTANEOUS | 1 refills | Status: DC

## 2020-03-01 MED ORDER — DOXYCYCLINE HYCLATE 100 MG PO CAPS: ORAL_CAPSULE | ORAL | 3 refills | Status: DC

## 2020-03-01 NOTE — Progress Notes (Signed)
Subjective:    Patient ID: Jose Evans, male    DOB: 1945-05-04, 74 y.o.   MRN: [id number]  HPI  74 year old male who  has a past medical history of Adult acne, Cancer ([place]) (07/2017), Gout, Hypertension, and Thyroid disease.  Presents to the office today for follow-up regarding bradycardia.  He was recently seen at sports medicine at which time it was noted that his pulse was 34.  He does have a history of bradycardia and is on metoprolol 50 mg daily.  He did deny any episodes of syncope but noted to orthopedics that he was having some issues with dizziness at times especially with prolonged standing.  Denied chest pain or palpitations.  Sports medicine sent me a note and I called the patient and had him decrease his metoprolol to 25 mg daily and follow-up in the office in roughly 3 weeks.  He reports that since decreasing the the metoprolol he no longer feels dizzy. He denies chest pain or palpitations.  He has not really started the lisinopril just yet as he took it for 3 days when he is on metoprolol 2 mg and felt like his blood pressure was dropping so he has not taken it recently.  He is not checking his blood pressures at home either  Pulse Readings from Last 3 Encounters:  03/01/20 64  02/08/20 (!) 34  01/26/20 (!) 47   BP Readings from Last 3 Encounters:  03/01/20 140/80  02/08/20 130/80  01/26/20 124/80   We also need to recheck his TSH today as we increased his dose from 150 mcg to 175 mcg approximately 1 month ago.  Additionally, he reports that he has cracked multiple teeth recently and is started to notice some pain at the site of the one of the cracked tooth.  He is worried that it is starting to get infected.  Denies foul taste or odor currently.  Has a follow-up appointment with his dentist on November 18  He also needs a refill of doxycycline that he uses periodically for folliculitis  He also has a history of genital warts and has used Condylox external solution  which works well for him.  He has not an outbreak multiple years but currently has 1 wart on his penis.    Review of Systems See HPI   Past Medical History:  Diagnosis Date  . Adult acne   . Cancer ([place]) 07/2017   skin cancer  . Gout   . Hypertension   . Thyroid disease     Social History   Socioeconomic History  . Marital status: Married    Spouse name: Not on file  . Number of children: Not on file  . Years of education: Not on file  . Highest education level: Not on file  Occupational History  . Not on file  Tobacco Use  . Smoking status: Former Smoker    Packs/day: 0.50    Years: 30.00    Pack years: 15.00    Types: Cigarettes    Quit date: 09/07/2005    Years since quitting: 14.4  . Smokeless tobacco: Never Used  Vaping Use  . Vaping Use: Never used  Substance and Sexual Activity  . Alcohol use: Yes    Comment: FEW SHOTS A WEEK  . Drug use: No  . Sexual activity: Not on file  Other Topics Concern  . Not on file  Social History Narrative   Works for [employer]    Married  m   Social Determinants of Health   Financial Resource Strain:   . Difficulty of Paying Living Expenses: Not on file  Food Insecurity:   . Worried About Charity fundraiser in the Last Year: Not on file  . Ran Out of Food in the Last Year: Not on file  Transportation Needs:   . Lack of Transportation (Medical): Not on file  . Lack of Transportation (Non-Medical): Not on file  Physical Activity:   . Days of Exercise per Week: Not on file  . Minutes of Exercise per Session: Not on file  Stress:   . Feeling of Stress : Not on file  Social Connections:   . Frequency of Communication with Friends and Family: Not on file  . Frequency of Social Gatherings with Friends and Family: Not on file  . Attends Religious Services: Not on file  . Active Member of Clubs or Organizations: Not on file  . Attends Archivist Meetings: Not on file  . Marital Status: Not on  file  Intimate Partner Violence:   . Fear of Current or Ex-Partner: Not on file  . Emotionally Abused: Not on file  . Physically Abused: Not on file  . Sexually Abused: Not on file    Past Surgical History:  Procedure Laterality Date  . HERNIA REPAIR    . SHOULDER SURGERY    . TONSILLECTOMY      Family History  Problem Relation Age of Onset  . Hypertension Other     No Known Allergies  Current Outpatient Medications on File Prior to Visit  Medication Sig Dispense Refill  . allopurinol (ZYLOPRIM) 300 MG tablet TAKE 1 TABLET BY MOUTH EVERY DAY 90 tablet 1  . amphetamine-dextroamphetamine (ADDERALL) 10 MG tablet Take 1 tablet (10 mg total) by mouth daily. 30 tablet 0  . amphetamine-dextroamphetamine (ADDERALL) 10 MG tablet Take 1 tablet (10 mg total) by mouth daily. 30 tablet 0  . doxycycline (VIBRAMYCIN) 100 MG capsule TAKE 1 CAPSULE (100 MG TOTAL) BY MOUTH 2 (TWO) TIMES DAILY. 30 capsule 3  . finasteride (PROSCAR) 5 MG tablet TAKE 1 TABLET BY MOUTH EVERY DAY 90 tablet 1  . levothyroxine (SYNTHROID) 150 MCG tablet TAKE 1 TABLET BY MOUTH EVERY DAY 90 tablet 1  . levothyroxine (SYNTHROID) 175 MCG tablet Take 1 tablet (175 mcg total) by mouth daily. 90 tablet 1  . lisinopril (ZESTRIL) 5 MG tablet TAKE 1 TABLET BY MOUTH EVERY DAY 90 tablet 1  . meloxicam (MOBIC) 7.5 MG tablet Take 1 tablet by mouth daily as needed.  2  . metFORMIN (GLUCOPHAGE) 500 MG tablet Take 1 tablet (500 mg total) by mouth daily with breakfast. 90 tablet 1  . metoprolol tartrate (LOPRESSOR) 50 MG tablet TAKE 1 TABLET BY MOUTH EVERY DAY 90 tablet 1  . sildenafil (REVATIO) 20 MG tablet TAKE 1 TO 2 TABLETS BY MOUTH 2 TO 3 HRS BEFORE SEX 30 tablet 5  . traZODone (DESYREL) 50 MG tablet Take 1 tablet (50 mg total) by mouth at bedtime as needed for sleep. 90 tablet 0  . triamcinolone cream (KENALOG) 0.1 % Apply 1 application topically 2 (two) times daily. 30 g 0  . triamterene-hydrochlorothiazide (MAXZIDE) 75-50 MG  tablet Take 1 tablet by mouth daily. 90 tablet 0  . amphetamine-dextroamphetamine (ADDERALL) 10 MG tablet Take 1 tablet (10 mg total) by mouth daily. 30 tablet 0   No current facility-administered medications on file prior to visit.    BP 140/80 (BP Location:  Left Arm, Patient Position: Sitting, Cuff Size: Normal)   Pulse 64   Temp 98.5 F (36.9 C) (Oral)   Ht 5\' 8"  (1.727 m)   Wt 186 lb 6.4 oz (84.6 kg)   SpO2 98%   BMI 28.34 kg/m       Objective:   Physical Exam Vitals and nursing note reviewed.  Constitutional:      Appearance: Normal appearance.  HENT:     Head: Normocephalic and atraumatic.     Mouth/Throat:     Mouth: Mucous membranes are dry.     Dentition: Abnormal dentition. Dental abscesses present.  Eyes:     Extraocular Movements: Extraocular movements intact.  Cardiovascular:     Rate and Rhythm: Normal rate and regular rhythm.     Pulses: Normal pulses.     Heart sounds: Normal heart sounds.  Pulmonary:     Effort: Pulmonary effort is normal.     Breath sounds: Normal breath sounds.  Musculoskeletal:        General: Normal range of motion.  Skin:    General: Skin is warm and dry.     Capillary Refill: Capillary refill takes less than 2 seconds.  Neurological:     General: No focal deficit present.     Mental Status: He is alert and oriented to person, place, and time.  Psychiatric:        Mood and Affect: Mood normal.        Behavior: Behavior normal.        Thought Content: Thought content normal.        Judgment: Judgment normal.       Assessment & Plan:  1. Bradycardia -Pulse has improved to the 60s and he is no longer symptomatic.  We will keep him on Lopressor 25 mg.  He was advised to monitor his blood pressure at home and if above 902 systolic then he can start his lisinopril 5 mg - metoprolol tartrate (LOPRESSOR) 25 MG tablet; Take 1 tablet (25 mg total) by mouth daily.  Dispense: 90 tablet; Refill: 3  2. Folliculitis  - doxycycline  (VIBRAMYCIN) 100 MG capsule; TAKE 1 CAPSULE (100 MG TOTAL) BY MOUTH 2 (TWO) TIMES DAILY.  Dispense: 30 capsule; Refill: 3  3. Hypothyroidism, unspecified type - Consider further dose adjustment  - TSH; Future - T3, Free; Future - T4, Free; Future - T4, Free - T3, Free - TSH  4. Dental abscess  - amoxicillin (AMOXIL) 500 MG capsule; Take 1 capsule (500 mg total) by mouth 2 (two) times daily for 10 days.  Dispense: 20 capsule; Refill: 0  5. Genital warts  - podofilox (CONDYLOX) 0.5 % external solution; Apply topically 2 (two) times daily. Twice a day for 3 days and then stop for four days. Can be repeated 4 times  Dispense: 3.5 mL; Refill: 1   [name], NP

## 2020-03-02 ENCOUNTER — Ambulatory Visit (HOSPITAL_COMMUNITY): Payer: Medicare HMO

## 2020-03-06 ENCOUNTER — Encounter (HOSPITAL_COMMUNITY): Payer: Medicare HMO

## 2020-03-08 ENCOUNTER — Encounter (HOSPITAL_COMMUNITY): Payer: Medicare HMO

## 2020-03-08 DIAGNOSIS — R69 Illness, unspecified: Secondary | ICD-10-CM | POA: Diagnosis not present

## 2020-03-13 ENCOUNTER — Telehealth: Payer: Self-pay

## 2020-03-13 ENCOUNTER — Other Ambulatory Visit: Payer: Self-pay | Admitting: Adult Health

## 2020-03-13 DIAGNOSIS — F988 Other specified behavioral and emotional disorders with onset usually occurring in childhood and adolescence: Secondary | ICD-10-CM

## 2020-03-13 MED ORDER — AMPHETAMINE-DEXTROAMPHETAMINE 10 MG PO TABS: 10.0000 mg | ORAL_TABLET | Freq: Every day | ORAL | 0 refills | Status: DC

## 2020-03-13 NOTE — Telephone Encounter (Signed)
Patient called in requesting a refill on his medication   amphetamine-dextroamphetamine (ADDERALL) 10 MG tablet  [id number] - [place], [place] [address]

## 2020-03-13 NOTE — Telephone Encounter (Signed)
Pt LOV was 03/01/2020 and Last refill was done on 01/10/2020 for a 90 days, Please advise

## 2020-03-14 DIAGNOSIS — H5212 Myopia, left eye: Secondary | ICD-10-CM | POA: Diagnosis not present

## 2020-03-26 ENCOUNTER — Other Ambulatory Visit: Payer: Self-pay | Admitting: Adult Health

## 2020-03-26 MED ORDER — TRIAMTERENE-HCTZ 75-50 MG PO TABS: 1.0000 | ORAL_TABLET | Freq: Every day | ORAL | 0 refills | Status: DC

## 2020-03-27 NOTE — Progress Notes (Signed)
   I, [name], LAT, ATC, am serving as scribe for Dr. Lynne Leader.  Jose Evans is a 74 y.o. male who presents to [facility] today for f/u of chronic L shoulder pain.  He was last seen by Dr. Georgina Snell on 02/08/20 and had a L shoulder subacromial injection.  He was referred to PT/OT and has completed 2 sessions.  Since his last visit, pt reports that his L shoulder is doing "a lot better" and rates his improvement at 75%.  He states that the biggest improvement is that he is now able to sleep which was a big problem previously.  He has been doing his HEP as prescribed by PT/OT.  Diagnostic imaging: L shoulder XR- 02/08/20  Pertinent review of systems: No fevers or chills  Relevant historical information: Hypertension   Exam:  BP (!) 148/88 (BP Location: Right Arm, Patient Position: Sitting, Cuff Size: Normal)   Pulse (!) 57   Ht 5\' 8"  (1.727 m)   Wt 186 lb (84.4 kg)   SpO2 98%   BMI 28.28 kg/m  General: Well Developed, well nourished, and in no acute distress.   MSK: Left shoulder normal-appearing Nontender. Normal motion. Strength diminished 4/5 abduction and external rotation 5/5 internal rotation. Mildly positive Hawkins and Neer's test.      Assessment and Plan: 74 y.o. male with left shoulder pain thought to be due to partial rotator cuff tear and tendinopathy significantly improved with a bit of time, home exercise program, limited PT, and subacromial injection.  He rates his improvement at about 75% and is very happy with how things are going.  Although he continues to have some pain and weakness he does not want to consider surgery at this time nor is interested in further interventions.  Plan to continue home exercise program and watchful waiting.  Recheck as needed in the future.  Neck step would be likely MRI to further characterize cause of pain if needed.     Discussed warning signs or symptoms. Please see discharge instructions.  Patient expresses understanding.   The above documentation has been reviewed and is accurate and complete Lynne Leader, M.D.   Total encounter time 20 minutes including face-to-face time with the patient and, reviewing past medical record, and charting on the date of service.

## 2020-03-28 ENCOUNTER — Ambulatory Visit: Payer: Medicare HMO | Admitting: Family Medicine

## 2020-03-28 ENCOUNTER — Encounter: Payer: Self-pay | Admitting: Family Medicine

## 2020-03-28 ENCOUNTER — Other Ambulatory Visit: Payer: Self-pay | Admitting: Adult Health

## 2020-03-28 ENCOUNTER — Other Ambulatory Visit: Payer: Self-pay

## 2020-03-28 VITALS — BP 148/88 | HR 57 | Ht 68.0 in | Wt 186.0 lb

## 2020-03-28 DIAGNOSIS — M25512 Pain in left shoulder: Secondary | ICD-10-CM

## 2020-03-28 DIAGNOSIS — G8929 Other chronic pain: Secondary | ICD-10-CM

## 2020-03-28 NOTE — Patient Instructions (Addendum)
Thank you for coming in today.  Plan to continue home exercise plan.  If worsening could do MRI.   Recheck as needed.   Let me know if we need to do more.

## 2020-04-02 MED ORDER — TRIAMTERENE-HCTZ 75-50 MG PO TABS
1.0000 | ORAL_TABLET | Freq: Every day | ORAL | 0 refills | Status: DC
Start: 2020-04-02 — End: 2020-09-11

## 2020-04-11 ENCOUNTER — Telehealth: Payer: Self-pay | Admitting: Adult Health

## 2020-04-11 ENCOUNTER — Encounter: Payer: Self-pay | Admitting: Adult Health

## 2020-04-11 NOTE — Progress Notes (Signed)
°  Chronic Care Management   Outreach Note  04/11/2020 Name: Moses Odoherty MRN: [id number] DOB: [birth year]  Referred by: [name], NP Reason for referral : No chief complaint on file.   An unsuccessful telephone outreach was attempted today. The patient was referred to the pharmacist for assistance with care management and care coordination.   Follow Up Plan:   Carley Perdue [employer] Scheduler

## 2020-04-26 ENCOUNTER — Other Ambulatory Visit: Payer: Self-pay | Admitting: Adult Health

## 2020-04-26 ENCOUNTER — Encounter: Payer: Self-pay | Admitting: Adult Health

## 2020-04-26 DIAGNOSIS — G479 Sleep disorder, unspecified: Secondary | ICD-10-CM

## 2020-04-26 DIAGNOSIS — G8929 Other chronic pain: Secondary | ICD-10-CM

## 2020-04-26 MED ORDER — TRAZODONE HCL 50 MG PO TABS: 50.0000 mg | ORAL_TABLET | Freq: Every evening | ORAL | 1 refills | Status: DC | PRN

## 2020-05-04 ENCOUNTER — Telehealth: Payer: Self-pay | Admitting: Adult Health

## 2020-05-04 NOTE — Progress Notes (Signed)
  Chronic Care Management   Outreach Note  05/04/2020 Name: Niccolas Loeper MRN: [id number] DOB: 07-31-1945  Referred by: [name], NP Reason for referral : No chief complaint on file.   A second unsuccessful telephone outreach was attempted today. The patient was referred to pharmacist for assistance with care management and care coordination.  Follow Up Plan:   Carley Perdue [employer] Scheduler

## 2020-05-17 ENCOUNTER — Encounter (HOSPITAL_COMMUNITY): Payer: Self-pay

## 2020-05-17 NOTE — Therapy (Signed)
[facility] [place], [place], [address] Phone: [phone]   Fax:  [phone]  Patient Details  Name: Jose Evans MRN: [id number] Date of Birth: February 24, 1946 Referring Provider:  No ref. provider found  Encounter Date: 05/17/2020  OCCUPATIONAL THERAPY DISCHARGE SUMMARY  Visits from Start of Care: 2  Current functional level related to goals / functional outcomes: Patient called to cancel all appointments and be discharged from therapy. Patient started new job and does not know his hours.    Remaining deficits: unknown   Education / Equipment: HEP Plan: Patient agrees to discharge.  Patient goals were not met. Patient is being discharged due to the patient's request.  ?????          Ailene Ravel, OTR/L,CBIS  [phone]  05/17/2020, 4:51 PM  [facility] [address] [place], [place], [address] Phone: [phone]   Fax:  [phone]

## 2020-06-19 ENCOUNTER — Telehealth: Payer: Self-pay | Admitting: Adult Health

## 2020-06-19 NOTE — Progress Notes (Signed)
  Chronic Care Management   Outreach Note  06/19/2020 Name: Lashon Hillier MRN: [id number] DOB: 1945-06-10  Referred by: [name], NP Reason for referral : No chief complaint on file.   Third unsuccessful telephone outreach was attempted today. The patient was referred to the pharmacist for assistance with care management and care coordination.   Follow Up Plan:   Carley Perdue [employer] Scheduler

## 2020-07-13 ENCOUNTER — Other Ambulatory Visit: Payer: Self-pay | Admitting: Adult Health

## 2020-07-13 DIAGNOSIS — F988 Other specified behavioral and emotional disorders with onset usually occurring in childhood and adolescence: Secondary | ICD-10-CM

## 2020-07-13 MED ORDER — AMPHETAMINE-DEXTROAMPHETAMINE 10 MG PO TABS: 10.0000 mg | ORAL_TABLET | Freq: Every day | ORAL | 0 refills | Status: DC

## 2020-07-13 NOTE — Telephone Encounter (Signed)
Last filled 03/18/2020 Last OV 03/01/2020  Ok to fill?

## 2020-07-19 ENCOUNTER — Encounter: Payer: Self-pay | Admitting: Adult Health

## 2020-07-19 ENCOUNTER — Other Ambulatory Visit: Payer: Self-pay | Admitting: Adult Health

## 2020-07-19 ENCOUNTER — Ambulatory Visit (INDEPENDENT_AMBULATORY_CARE_PROVIDER_SITE_OTHER): Payer: Medicare HMO

## 2020-07-19 ENCOUNTER — Other Ambulatory Visit: Payer: Self-pay

## 2020-07-19 ENCOUNTER — Ambulatory Visit (INDEPENDENT_AMBULATORY_CARE_PROVIDER_SITE_OTHER): Payer: Medicare HMO | Admitting: Adult Health

## 2020-07-19 VITALS — BP 150/80 | HR 60 | Temp 97.5°F | Ht 68.0 in | Wt 189.2 lb

## 2020-07-19 DIAGNOSIS — M25512 Pain in left shoulder: Secondary | ICD-10-CM | POA: Diagnosis not present

## 2020-07-19 DIAGNOSIS — G8929 Other chronic pain: Secondary | ICD-10-CM

## 2020-07-19 DIAGNOSIS — D492 Neoplasm of unspecified behavior of bone, soft tissue, and skin: Secondary | ICD-10-CM

## 2020-07-19 DIAGNOSIS — E039 Hypothyroidism, unspecified: Secondary | ICD-10-CM

## 2020-07-19 DIAGNOSIS — M19012 Primary osteoarthritis, left shoulder: Secondary | ICD-10-CM | POA: Diagnosis not present

## 2020-07-19 DIAGNOSIS — R002 Palpitations: Secondary | ICD-10-CM

## 2020-07-19 MED ORDER — LEVOTHYROXINE SODIUM 175 MCG PO TABS: 175.0000 ug | ORAL_TABLET | Freq: Every day | ORAL | 1 refills | Status: DC
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
## 2020-07-24 ENCOUNTER — Telehealth: Payer: Self-pay | Admitting: Dermatology

## 2020-07-24 NOTE — Telephone Encounter (Signed)
Notes documented and referral routed back to referring office. 

## 2020-07-24 NOTE — Telephone Encounter (Signed)
Patient is calling for a referral appointment from [facility], [place].  Patient does not want to wait until 12/2020 for an appointment so wants referral sent back to Dr. Carlisle Cater.

## 2020-08-06 DIAGNOSIS — L570 Actinic keratosis: Secondary | ICD-10-CM | POA: Diagnosis not present

## 2020-08-06 DIAGNOSIS — L821 Other seborrheic keratosis: Secondary | ICD-10-CM | POA: Diagnosis not present

## 2020-08-06 DIAGNOSIS — C44329 Squamous cell carcinoma of skin of other parts of face: Secondary | ICD-10-CM | POA: Diagnosis not present

## 2020-08-06 DIAGNOSIS — D485 Neoplasm of uncertain behavior of skin: Secondary | ICD-10-CM | POA: Diagnosis not present

## 2020-08-14 ENCOUNTER — Other Ambulatory Visit: Payer: Self-pay | Admitting: Adult Health

## 2020-08-14 DIAGNOSIS — G8929 Other chronic pain: Secondary | ICD-10-CM

## 2020-08-14 DIAGNOSIS — G479 Sleep disorder, unspecified: Secondary | ICD-10-CM

## 2020-08-14 DIAGNOSIS — M25512 Pain in left shoulder: Secondary | ICD-10-CM

## 2020-08-21 DIAGNOSIS — L570 Actinic keratosis: Secondary | ICD-10-CM | POA: Diagnosis not present

## 2020-08-21 DIAGNOSIS — C44329 Squamous cell carcinoma of skin of other parts of face: Secondary | ICD-10-CM | POA: Diagnosis not present

## 2020-08-26 ENCOUNTER — Other Ambulatory Visit: Payer: Self-pay | Admitting: Adult Health

## 2020-08-26 DIAGNOSIS — N401 Enlarged prostate with lower urinary tract symptoms: Secondary | ICD-10-CM

## 2020-08-27 ENCOUNTER — Encounter: Payer: Self-pay | Admitting: Adult Health

## 2020-08-28 ENCOUNTER — Other Ambulatory Visit: Payer: Self-pay | Admitting: Adult Health

## 2020-08-28 DIAGNOSIS — D492 Neoplasm of unspecified behavior of bone, soft tissue, and skin: Secondary | ICD-10-CM

## 2020-09-05 ENCOUNTER — Encounter: Payer: Self-pay | Admitting: Adult Health

## 2020-09-06 ENCOUNTER — Other Ambulatory Visit: Payer: Self-pay | Admitting: Adult Health

## 2020-09-06 NOTE — Telephone Encounter (Signed)
Noted  

## 2020-09-10 DIAGNOSIS — H6123 Impacted cerumen, bilateral: Secondary | ICD-10-CM | POA: Diagnosis not present

## 2020-09-10 DIAGNOSIS — K118 Other diseases of salivary glands: Secondary | ICD-10-CM | POA: Diagnosis not present

## 2020-09-10 DIAGNOSIS — Z77122 Contact with and (suspected) exposure to noise: Secondary | ICD-10-CM | POA: Diagnosis not present

## 2020-09-27 ENCOUNTER — Other Ambulatory Visit: Payer: Self-pay | Admitting: Otolaryngology

## 2020-09-27 DIAGNOSIS — K118 Other diseases of salivary glands: Secondary | ICD-10-CM

## 2020-10-02 DIAGNOSIS — Z85828 Personal history of other malignant neoplasm of skin: Secondary | ICD-10-CM | POA: Diagnosis not present

## 2020-10-02 DIAGNOSIS — L905 Scar conditions and fibrosis of skin: Secondary | ICD-10-CM | POA: Diagnosis not present

## 2020-10-05 ENCOUNTER — Encounter: Payer: Self-pay | Admitting: Adult Health

## 2020-10-15 ENCOUNTER — Ambulatory Visit
Admission: RE | Admit: 2020-10-15 | Discharge: 2020-10-15 | Disposition: A | Discharge status: Home / Self Care | Payer: Medicare HMO | Source: Ambulatory Visit | Attending: Otolaryngology | Admitting: Otolaryngology

## 2020-10-15 ENCOUNTER — Other Ambulatory Visit: Payer: Self-pay

## 2020-10-15 DIAGNOSIS — K118 Other diseases of salivary glands: Secondary | ICD-10-CM | POA: Diagnosis not present

## 2020-10-15 DIAGNOSIS — R221 Localized swelling, mass and lump, neck: Secondary | ICD-10-CM | POA: Diagnosis not present

## 2020-10-15 DIAGNOSIS — R220 Localized swelling, mass and lump, head: Secondary | ICD-10-CM | POA: Diagnosis not present

## 2020-10-15 MED ORDER — IOPAMIDOL (ISOVUE-300) INJECTION 61%
75.0000 mL | Freq: Once | INTRAVENOUS | Status: AC | PRN
  Administered 2020-10-15: 75 mL via INTRAVENOUS

## 2020-10-17 ENCOUNTER — Other Ambulatory Visit: Payer: Self-pay | Admitting: Adult Health

## 2020-10-17 DIAGNOSIS — F988 Other specified behavioral and emotional disorders with onset usually occurring in childhood and adolescence: Secondary | ICD-10-CM

## 2020-10-17 MED ORDER — AMPHETAMINE-DEXTROAMPHETAMINE 10 MG PO TABS
10.0000 mg | ORAL_TABLET | Freq: Every day | ORAL | 0 refills | Status: DC
Start: 2020-10-17 — End: 2021-01-17

## 2020-10-17 MED ORDER — AMPHETAMINE-DEXTROAMPHETAMINE 10 MG PO TABS: 10.0000 mg | ORAL_TABLET | Freq: Every day | ORAL | 0 refills | Status: DC

## 2020-10-18 ENCOUNTER — Telehealth: Payer: Self-pay | Admitting: Adult Health

## 2020-10-18 NOTE — Chronic Care Management (AMB) (Signed)
  Chronic Care Management   Note  10/18/2020 Name: Jose Evans MRN: [id number] DOB: [birth year]  Jose Evans is a 75 y.o. year old male who is a primary care patient of [name], NP. I reached out to Cristela Blue by phone today in response to a referral sent by Mr. Jose Evans PCP, [name], NP.   [name] was given information about Chronic Care Management services today including:  CCM service includes personalized support from designated clinical staff supervised by his physician, including individualized plan of care and coordination with other care providers 24/7 contact phone numbers for assistance for urgent and routine care needs. Service will only be billed when office clinical staff spend 20 minutes or more in a month to coordinate care. Only one practitioner may furnish and bill the service in a calendar month. The patient may stop CCM services at any time (effective at the end of the month) by phone call to the office staff.   Patient agreed to services and verbal consent obtained.   Follow up plan:   Tatjana Secretary/administrator

## 2020-10-29 ENCOUNTER — Encounter: Payer: Self-pay | Admitting: Adult Health

## 2020-10-29 DIAGNOSIS — K118 Other diseases of salivary glands: Secondary | ICD-10-CM | POA: Diagnosis not present

## 2020-10-30 ENCOUNTER — Other Ambulatory Visit: Payer: Self-pay | Admitting: Adult Health

## 2020-10-30 DIAGNOSIS — G8929 Other chronic pain: Secondary | ICD-10-CM

## 2020-10-30 DIAGNOSIS — G479 Sleep disorder, unspecified: Secondary | ICD-10-CM

## 2020-10-30 MED ORDER — TRAZODONE HCL 50 MG PO TABS: 50.0000 mg | ORAL_TABLET | Freq: Every evening | ORAL | 1 refills | Status: DC | PRN

## 2020-12-09 ENCOUNTER — Other Ambulatory Visit: Payer: Self-pay | Admitting: Adult Health

## 2020-12-17 ENCOUNTER — Ambulatory Visit: Payer: Medicare HMO

## 2021-01-02 ENCOUNTER — Other Ambulatory Visit: Payer: Self-pay

## 2021-01-02 ENCOUNTER — Ambulatory Visit (INDEPENDENT_AMBULATORY_CARE_PROVIDER_SITE_OTHER): Payer: Medicare HMO

## 2021-01-02 ENCOUNTER — Encounter: Payer: Self-pay | Admitting: Adult Health

## 2021-01-02 ENCOUNTER — Ambulatory Visit (INDEPENDENT_AMBULATORY_CARE_PROVIDER_SITE_OTHER): Payer: Medicare HMO | Admitting: Adult Health

## 2021-01-02 VITALS — BP 138/90 | HR 89 | Temp 98.0°F | Wt 190.0 lb

## 2021-01-02 DIAGNOSIS — R000 Tachycardia, unspecified: Secondary | ICD-10-CM

## 2021-01-02 NOTE — Patient Instructions (Addendum)
I am going to put the order in for you to have a heart monitor placed - please have it done this time   Once I get the results back I will let you know

## 2021-01-02 NOTE — Progress Notes (Unsigned)
Patient enrolled for [employer] to mail a 14 day ZIO XT monitor to his address on file.

## 2021-01-02 NOTE — Progress Notes (Signed)
Subjective:    Patient ID: Jose Evans, male    DOB: [birth year], 75 y.o.   MRN: [id number]  HPI  75 year old male who  has a past medical history of Adult acne, Cancer ([place]) (07/2017), Gout, Hypertension, and Thyroid disease.  He presents to the office today for follow up regarding palpitations. When he was last seen for this issue back in 06/2020 a heart monitor was ordered  but never went to get it placed.  His symptoms have been intermittent as far back as 2012.  His symptoms for about 2 hours and happen about once a week.  During these episodes he noticed intense tinnitus, tachycardia upwards of 130s at which time he can have some painful chest discomfort.  Never noticed any rhyme or reason.  When this happens at home he takes an extra dose of metoprolol 25 mg and a muscle relaxer which seems to help.   He would like to get the heart monitor placed Review of Systems See HPI   Past Medical History:  Diagnosis Date  . Adult acne   . Cancer ([place]) 07/2017   skin cancer  . Gout   . Hypertension   . Thyroid disease     Social History   Socioeconomic History  . Marital status: Married    Spouse name: Not on file  . Number of children: Not on file  . Years of education: Not on file  . Highest education level: Not on file  Occupational History  . Not on file  Tobacco Use  . Smoking status: Former    Packs/day: 0.50    Years: 30.00    Pack years: 15.00    Types: Cigarettes    Quit date: 09/07/2005    Years since quitting: 15.3  . Smokeless tobacco: Never  Vaping Use  . Vaping Use: Never used  Substance and Sexual Activity  . Alcohol use: Yes    Comment: FEW SHOTS A WEEK  . Drug use: No  . Sexual activity: Not on file  Other Topics Concern  . Not on file  Social History Narrative   Works for [employer]    Married   m   Social Determinants of Radio broadcast assistant Strain: Not on file  Food Insecurity: Not on file  Transportation Needs: Not on  file  Physical Activity: Not on file  Stress: Not on file  Social Connections: Not on file  Intimate Partner Violence: Not on file    Past Surgical History:  Procedure Laterality Date  . HERNIA REPAIR    . SHOULDER SURGERY    . TONSILLECTOMY      Family History  Problem Relation Age of Onset  . Hypertension Other     No Known Allergies  Current Outpatient Medications on File Prior to Visit  Medication Sig Dispense Refill  . allopurinol (ZYLOPRIM) 300 MG tablet TAKE 1 TABLET BY MOUTH EVERY DAY 90 tablet 1  . amphetamine-dextroamphetamine (ADDERALL) 10 MG tablet Take 1 tablet (10 mg total) by mouth daily. 30 tablet 0  . amphetamine-dextroamphetamine (ADDERALL) 10 MG tablet Take 1 tablet (10 mg total) by mouth daily. 30 tablet 0  . amphetamine-dextroamphetamine (ADDERALL) 10 MG tablet Take 1 tablet (10 mg total) by mouth daily. 30 tablet 0  . finasteride (PROSCAR) 5 MG tablet TAKE 1 TABLET BY MOUTH EVERY DAY 90 tablet 1  . levothyroxine (SYNTHROID) 175 MCG tablet Take 1 tablet (175 mcg total) by mouth daily. 90 tablet 1  .  lisinopril (ZESTRIL) 5 MG tablet TAKE 1 TABLET BY MOUTH EVERY DAY (Patient not taking: Reported on 07/19/2020) 90 tablet 1  . meloxicam (MOBIC) 7.5 MG tablet Take 1 tablet by mouth daily as needed.  2  . metFORMIN (GLUCOPHAGE) 500 MG tablet Take 1 tablet (500 mg total) by mouth daily with breakfast. 90 tablet 1  . metoprolol tartrate (LOPRESSOR) 25 MG tablet Take 1 tablet (25 mg total) by mouth daily. 90 tablet 1  . podofilox (CONDYLOX) 0.5 % external solution Apply topically 2 (two) times daily. Twice a day for 3 days and then stop for four days. Can be repeated 4 times 3.5 mL 1  . traZODone (DESYREL) 50 MG tablet Take 1 tablet (50 mg total) by mouth at bedtime as needed for sleep. 90 tablet 1  . triamcinolone cream (KENALOG) 0.1 % Apply 1 application topically 2 (two) times daily. 30 g 0  . triamterene-hydrochlorothiazide (MAXZIDE) 75-50 MG tablet TAKE 1 TABLET BY  MOUTH EVERY DAY 90 tablet 0   No current facility-administered medications on file prior to visit.    BP 138/90   Pulse 89   Temp 98 F (36.7 C)   Wt 190 lb (86.2 kg)   SpO2 96%   BMI 28.89 kg/m       Objective:   Physical Exam Vitals and nursing note reviewed.  Constitutional:      Appearance: Normal appearance.  Cardiovascular:     Rate and Rhythm: Normal rate and regular rhythm.     Pulses: Normal pulses.     Heart sounds: Normal heart sounds.  Pulmonary:     Effort: Pulmonary effort is normal.     Breath sounds: Normal breath sounds.  Musculoskeletal:        General: Normal range of motion.  Skin:    General: Skin is warm and dry.  Neurological:     General: No focal deficit present.     Mental Status: He is alert and oriented to person, place, and time.  Psychiatric:        Mood and Affect: Mood normal.        Behavior: Behavior normal.        Thought Content: Thought content normal.        Judgment: Judgment normal.      Assessment & Plan:   1. Tachycardia  - LONG TERM MONITOR (3-14 DAYS); Future - Consider referral to cardiology   [name], NP

## 2021-01-04 ENCOUNTER — Telehealth: Payer: Self-pay | Admitting: Pharmacist

## 2021-01-04 NOTE — Chronic Care Management (AMB) (Signed)
Chronic Care Management Pharmacy Assistant   Name: Jose Evans  MRN: [id number] DOB: 11/01/1945  Jose Evans is an 75 y.o. year old male who presents for his initial CCM visit with the clinical pharmacist.  Reason for Encounter: Chart prep for initial visit with Jeni Salles the Clinical Pharmacist on 01/07/2021 at 3:30.   Conditions to be addressed/monitored: HTN and Hypothyroidism and Gout  Recent office visits:  01/02/2021 [name], NP (PCP) - Patient was seen for tachycardia. No medication changes. Order placed for a heart monitor. No follow up noted. 07/19/2020 Nafziger, Tommi Rumps, NP (PCP) - Patient was seen for left should pain and 3 additional issues. Discontinued Sildenafil '20mg'$ . Referral to dermatology. No follow up noted.   Recent consult visits:  10/29/2020 Lynn Ito, MD (ENT) - Patient was seen for a mass of left parotid gland. No medication changes. Follow up sometime in the next several months to discuss surgery.  10/02/2020 Darrick Meigs (Dermatology) - Patient was seen for Scar condition, fibrosis and malignant neoplasm of skin. No medication changes, No follow up noted.  09/10/2020 Lynn Ito, MD (ENT) - Patient was seen for a mass of left parotid gland. No medication changes. Referral for CT scan, follow up after scan.  Hospital visits:  None in previous 6 months  Medications: Outpatient Encounter Medications as of 01/04/2021  Medication Sig   allopurinol (ZYLOPRIM) 300 MG tablet TAKE 1 TABLET BY MOUTH EVERY DAY   amphetamine-dextroamphetamine (ADDERALL) 10 MG tablet Take 1 tablet (10 mg total) by mouth daily.   amphetamine-dextroamphetamine (ADDERALL) 10 MG tablet Take 1 tablet (10 mg total) by mouth daily.   amphetamine-dextroamphetamine (ADDERALL) 10 MG tablet Take 1 tablet (10 mg total) by mouth daily.   finasteride (PROSCAR) 5 MG tablet TAKE 1 TABLET BY MOUTH EVERY DAY   levothyroxine (SYNTHROID) 175 MCG tablet Take 1 tablet  (175 mcg total) by mouth daily.   lisinopril (ZESTRIL) 5 MG tablet TAKE 1 TABLET BY MOUTH EVERY DAY (Patient not taking: Reported on 07/19/2020)   meloxicam (MOBIC) 7.5 MG tablet Take 1 tablet by mouth daily as needed.   metFORMIN (GLUCOPHAGE) 500 MG tablet Take 1 tablet (500 mg total) by mouth daily with breakfast.   metoprolol tartrate (LOPRESSOR) 25 MG tablet Take 1 tablet (25 mg total) by mouth daily.   podofilox (CONDYLOX) 0.5 % external solution Apply topically 2 (two) times daily. Twice a day for 3 days and then stop for four days. Can be repeated 4 times   traZODone (DESYREL) 50 MG tablet Take 1 tablet (50 mg total) by mouth at bedtime as needed for sleep.   triamcinolone cream (KENALOG) 0.1 % Apply 1 application topically 2 (two) times daily.   triamterene-hydrochlorothiazide (MAXZIDE) 75-50 MG tablet TAKE 1 TABLET BY MOUTH EVERY DAY   No facility-administered encounter medications on file as of 01/04/2021.   Fill History:  ALLOPURINOL 300 MG TABLET 12/02/2020 90   FINASTERIDE 5 MG TABLET 12/02/2020 90   LEVOTHYROXINE 175 MCG TABLET 10/21/2020 90   METOPROLOL TARTRATE 25 MG TAB 11/22/2020 90   TRIAMTERENE-HCTZ 75-50 MG TAB 12/10/2020 90   TRAZODONE 50 MG TABLET 10/30/2020 90   Have you seen any other providers since your last visit? **no  Any changes in your medications or health? no  Any side effects from any medications?  Yes.  Patient has discontinued Lisinopril and Metformin.  He is concerned with medications possibly causing him to have a low heart rate.  Do you have an  symptoms or problems not managed by your medications? no  Any concerns about your health right now? no  Has your provider asked that you check blood pressure, blood sugar, or follow special diet at home?  No.  Patient states his eating habits are well rounded but eats whatever he wants.  Do you get any type of exercise on a regular basis?  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  Can you think of a goal you would like to reach for your health? None  Do you have any problems getting your medications? No  Is there anything that you would like to discuss during the appointment? Yes. Tachycardia, patient states he has had this for over 10 years and would like to discuss this.  Patient would like to discuss medications prescribed by different doctors, he is concerned about interactions of mixing all the meds.  Please bring medications and supplements to appointment  Care Gaps:  AWV - message sent to Ramond Craver to schedule AWV Hepatitis C screening - never done Zoster vaccine - never doen Colonoscopy - never done Flu vaccine  - due  Star Rating Drugs: None  [facility] [phone]

## 2021-01-06 DIAGNOSIS — R000 Tachycardia, unspecified: Secondary | ICD-10-CM

## 2021-01-07 ENCOUNTER — Ambulatory Visit (INDEPENDENT_AMBULATORY_CARE_PROVIDER_SITE_OTHER): Payer: Medicare HMO | Admitting: Pharmacist

## 2021-01-07 ENCOUNTER — Other Ambulatory Visit: Payer: Self-pay

## 2021-01-07 VITALS — BP 150/90

## 2021-01-07 DIAGNOSIS — I10 Essential (primary) hypertension: Secondary | ICD-10-CM

## 2021-01-07 DIAGNOSIS — E039 Hypothyroidism, unspecified: Secondary | ICD-10-CM

## 2021-01-07 NOTE — Progress Notes (Signed)
Chronic Care Management Pharmacy Note  01/18/2021 Name:  Jose Evans MRN:  [id number] DOB:  Mar 27, 1946  Summary: Pt is concerned with his medications causing these freezing episodes BP is not at goal < 140/90  Recommendations/Changes made from today's visit: -Recommend tapering Adderall and switching to non-stimulant such as Atomoxetine -Recommend repeat lipid panel and if LDL > 100, start moderate intensity statin therapy -Recommended routine BP monitoring at home  Plan: Follow up BP assessment in 1-2 months   Subjective: Jose Evans is an 76 y.o. year old male who is a primary patient of [name], NP.  The CCM team was consulted for assistance with disease management and care coordination needs.    Engaged with patient face to face for initial visit in response to provider referral for pharmacy case management and/or care coordination services.   Consent to Services:  The patient was given the following information about Chronic Care Management services today, agreed to services, and gave verbal consent: 1. CCM service includes personalized support from designated clinical staff supervised by the primary care provider, including individualized plan of care and coordination with other care providers 2. 24/7 contact phone numbers for assistance for urgent and routine care needs. 3. Service will only be billed when office clinical staff spend 20 minutes or more in a month to coordinate care. 4. Only one practitioner may furnish and bill the service in a calendar month. 5.The patient may stop CCM services at any time (effective at the end of the month) by phone call to the office staff. 6. The patient will be responsible for cost sharing (co-pay) of up to 20% of the service fee (after annual deductible is met). Patient agreed to services and consent obtained.  Patient Care Team: [name], NP as PCP - General (Family Medicine) Viona Gilmore, [facility] as Pharmacist  (Pharmacist)  Recent office visits: 01/02/2021 [name], NP (PCP) - Patient was seen for tachycardia. No medication changes. Order placed for a heart monitor. No follow up noted.  07/19/2020 Nafziger, Tommi Rumps, NP (PCP) - Patient was seen for left should pain and 3 additional issues. Discontinued Sildenafil 77m. Referral to dermatology. No follow up noted.  Recent consult visits: 10/29/2020 SLynn Ito MD (ENT) - Patient was seen for a mass of left parotid gland. No medication changes. Follow up sometime in the next several months to discuss surgery.   10/02/2020 SDarrick Meigs(Dermatology) - Patient was seen for Scar condition, fibrosis and malignant neoplasm of skin. No medication changes, No follow up noted.   09/10/2020 SLynn Ito MD (ENT) - Patient was seen for a mass of left parotid gland. No medication changes. Referral for CT scan, follow up after scan.  Hospital visits: None in previous 6 months   Objective:  Lab Results  Component Value Date   CREATININE 1.14 01/26/2020   BUN 29 (H) 01/26/2020   GFR 73.60 01/25/2019   GFRNONAA 64 01/26/2020   GFRAA 74 01/26/2020   NA 140 01/26/2020   K 4.3 01/26/2020   CALCIUM 10.1 01/26/2020   CO2 23 01/26/2020   GLUCOSE 122 (H) 01/26/2020    Lab Results  Component Value Date/Time   HGBA1C 6.4 (H) 01/26/2020 01:38 PM   HGBA1C 6.2 06/23/2019 09:59 AM   GFR 73.60 01/25/2019 09:36 AM   GFR 86.39 12/30/2017 12:21 PM    Last diabetic Eye exam: No results found for: HMDIABEYEEXA  Last diabetic Foot exam: No results found for: HMDIABFOOTEX   Lab Results  Component Value Date   CHOL 192 01/26/2020   HDL 38 (L) 01/26/2020   LDLCALC 122 (H) 01/26/2020   TRIG 202 (H) 01/26/2020   CHOLHDL 5.1 (H) 01/26/2020    Hepatic Function Latest Ref Rng & Units 01/26/2020 01/25/2019 12/30/2017  Total Protein 6.1 - 8.1 g/dL 7.0 7.0 6.6  Albumin 3.5 - 5.2 g/dL - 4.9 4.5  AST 10 - 35 U/L _0 ALT 9 - 46 U/L 35 23  24  Alk Phosphatase 39 - 117 U/L - 57 55  Total Bilirubin 0.2 - 1.2 mg/dL 0.9 0.9 1.0  Bilirubin, Direct 0.0 - 0.3 mg/dL - - 0.2    Lab Results  Component Value Date/Time   TSH 1.01 03/01/2020 10:23 AM   TSH 9.42 (H) 01/26/2020 01:38 PM   FREET4 1.9 (H) 03/01/2020 10:23 AM   FREET4 1.07 06/23/2019 09:59 AM    CBC Latest Ref Rng & Units 01/26/2020 01/25/2019 12/30/2017  WBC 3.8 - 10.8 Thousand/uL 8.6 6.1 7.0  Hemoglobin 13.2 - 17.1 g/dL 16.2 15.7 15.8  Hematocrit 38.5 - 50.0 % 47.4 45.8 45.1  Platelets 140 - 400 Thousand/uL 236 220.0 197.0    No results found for: VD25OH  Clinical ASCVD: No  The 10-year ASCVD risk score (Arnett DK, et al., 2019) is: 30.6%   Values used to calculate the score:     Age: 41 years     Sex: Male     Is Non-Hispanic African American: No     Diabetic: No     Tobacco smoker: No     Systolic Blood Pressure: 400 mmHg     Is BP treated: Yes     HDL Cholesterol: 38 mg/dL     Total Cholesterol: 192 mg/dL    Depression screen [facility] 2/9 01/17/2021 01/17/2021 01/26/2020  Decreased Interest 0 0 0  Down, Depressed, Hopeless 0 0 0  PHQ - 2 Score 0 0 0      Social History   Tobacco Use  Smoking Status Former   Packs/day: 0.50   Years: 30.00   Pack years: 15.00   Types: Cigarettes   Quit date: 09/07/2005   Years since quitting: 15.3  Smokeless Tobacco Never   BP Readings from Last 3 Encounters:  01/17/21 138/78  01/02/21 138/90  07/19/20 (!) 150/80   Pulse Readings from Last 3 Encounters:  01/17/21 72  01/02/21 89  07/19/20 60   Wt Readings from Last 3 Encounters:  01/17/21 192 lb (87.1 kg)  01/02/21 190 lb (86.2 kg)  07/19/20 189 lb 3.2 oz (85.8 kg)   BMI Readings from Last 3 Encounters:  01/17/21 28.35 kg/m  01/02/21 28.89 kg/m  07/19/20 28.77 kg/m    Assessment/Interventions: Review of patient past medical history, allergies, medications, health status, including review of consultants reports, laboratory and other test data, was  performed as part of comprehensive evaluation and provision of chronic care management services.   SDOH:  (Social Determinants of Health) assessments and interventions performed: Yes SDOH Interventions    Flowsheet Row Most Recent Value  SDOH Interventions   Financial Strain Interventions Intervention Not Indicated  Transportation Interventions Intervention Not Indicated      SDOH Screenings   Alcohol Screen: Low Risk    Last Alcohol Screening Score (AUDIT): 1  Depression (PHQ2-9): Low Risk    PHQ-2 Score: 0  Financial Resource Strain: Low Risk    Difficulty of Paying Living Expenses: Not hard at all  Food Insecurity: No Food Insecurity  Worried About Charity fundraiser in the Last Year: Never true   Ran Out of Food in the Last Year: Never true  Housing: Low Risk    Last Housing Risk Score: 0  Physical Activity: Insufficiently Active   Days of Exercise per Week: 3 days   Minutes of Exercise per Session: 40 min  Social Connections: Moderately Isolated   Frequency of Communication with Friends and Family: Twice a week   Frequency of Social Gatherings with Friends and Family: Twice a week   Attends Religious Services: Never   Marine scientist or Organizations: No   Attends Music therapist: Never   Marital Status: Married  Stress: No Stress Concern Present   Feeling of Stress : Not at all  Tobacco Use: Medium Risk   Smoking Tobacco Use: Former   Smokeless Tobacco Use: Never  Transportation Needs: No Data processing manager (Medical): No   Lack of Transportation (Non-Medical): No   He retired after [place] hit but 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 He usually works 4-5 hours per shift and 3-4 times a week for a total of 20-24 hours. He does do a lot of climbing ladders and stairs with his job and doesn't note any shortness of breath.  When he isn't working, he does stuff around the house and in the  yard. He lives with his wife and has a daughter in [place] and 3 children in [place]. He is originally from [place] and moved up to [place] and then down to [facility]. 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 He still has a brother living in [place].  His biggest health concern is with his episodes of tachycardia and what he describes as a "brain freeze" feeling all over his body. It started 10 years ago when he was driving and had this feeling and thought he was hypoglycemic. This was around the time he started on Adderall. He does note that when he runs out of this, he feels lethargic in about 3 days time. He recently stopping lisinopril and metformin to make sure they weren't causing any problems.  Patient reports him and his wife haven't been eating out since moving to [place]. He usually grills at home and they eat a lot of fish, especially salmon and don't eat much red meat. His appetite has gone down overall. He isn't big on vegetables and does admit that he probably eats more desserts than he should. He drinks diet coke and [employer] and is working on trying to drink more water.  CCM Care Plan  No Known Allergies  Medications Reviewed Today     Reviewed by Randel Pigg, LPN (Licensed Practical Nurse) on 01/17/21 at 1317  Med List Status: <None>   Medication Order Taking? Sig Documenting Provider Last Dose Status Informant  allopurinol (ZYLOPRIM) 300 MG tablet [id number] Yes TAKE 1 TABLET BY MOUTH EVERY DAY Nafziger, Tommi Rumps, NP Taking Active   amphetamine-dextroamphetamine (ADDERALL) 10 MG tablet [id number]  Take 1 tablet (10 mg total) by mouth daily. Nafziger, Tommi Rumps, NP  Active   amphetamine-dextroamphetamine (ADDERALL) 10 MG tablet [id number] Yes Take 1 tablet (10 mg total) by mouth daily. Nafziger, Tommi Rumps, NP Taking Active   amphetamine-dextroamphetamine (ADDERALL) 10 MG tablet [id number] Yes Take 1 tablet (10 mg total) by mouth daily. Nafziger, Tommi Rumps, NP Taking Active    finasteride (PROSCAR) 5 MG tablet [id number] Yes TAKE 1 TABLET BY MOUTH EVERY DAY  Nafziger, Tommi Rumps, NP Taking Active   fluoruracil [facility]) 0.5 % cream [id number] Yes Apply 1 application topically daily as needed. [provider] Taking Active   levothyroxine (SYNTHROID) 175 MCG tablet [id number] Yes TAKE 1 TABLET BY MOUTH EVERY DAY Nafziger, Tommi Rumps, NP Taking Active   lisinopril (ZESTRIL) 5 MG tablet [id number] No TAKE 1 TABLET BY MOUTH EVERY DAY  Patient not taking: No sig reported   [name], NP Not Taking Active   meloxicam (MOBIC) 7.5 MG tablet [id number] Yes Take 1 tablet by mouth daily as needed. [provider] Taking Active   metFORMIN (GLUCOPHAGE) 500 MG tablet [id number]  Take 1 tablet (500 mg total) by mouth daily with breakfast.  Patient not taking: Reported on 01/07/2021   [name], NP  Expired 04/26/20 2359   metoprolol tartrate (LOPRESSOR) 25 MG tablet [id number] Yes Take 1 tablet (25 mg total) by mouth daily. Nafziger, Tommi Rumps, NP Taking Active   naproxen sodium (ALEVE) 220 MG tablet [id number] Yes Take 220 mg by mouth. [provider] Taking Active   podofilox (CONDYLOX) 0.5 % external solution [id number] No Apply topically 2 (two) times daily. Twice a day for 3 days and then stop for four days. Can be repeated 4 times  Patient not taking: No sig reported   [name], NP Not Taking Active   traZODone (DESYREL) 50 MG tablet [id number] Yes Take 1 tablet (50 mg total) by mouth at bedtime as needed for sleep. Nafziger, Tommi Rumps, NP Taking Active   triamcinolone cream (KENALOG) 0.1 % [id number] Yes Apply 1 application topically 2 (two) times daily. Nafziger, Tommi Rumps, NP Taking Active   triamterene-hydrochlorothiazide (MAXZIDE) 75-50 MG tablet [id number] Yes TAKE 1 TABLET BY MOUTH EVERY DAY [name], NP Taking Active             Patient Active Problem List   Diagnosis Date Noted   Peripheral neuropathy 07/26/2019   Generalized abdominal pain 12/30/2017    Chronic right shoulder pain 07/13/2017   Spinal stenosis of lumbar region with neurogenic claudication 01/13/2017   Routine general medical examination at a health care facility 05/28/2015   Capillary hemangioma of skin 03/13/2014   Dysplastic nevus of face 07/01/2011   SEBORRHEIC KERATOSIS, INFLAMED 05/06/2010   Attention deficit disorder 11/21/2009   Hypothyroidism 12/01/2006   Gout 12/01/2006   Essential hypertension 12/01/2006    Immunization History  Administered Date(s) Administered   Fluad Quad(high Dose 65+) 12/09/2018, 01/26/2020   Influenza Whole 03/05/2010   Influenza, High Dose Seasonal PF 03/13/2014, 05/28/2015, 12/30/2017   Influenza,inj,Quad PF,6+ Mos 02/09/2013   PFIZER Comirnaty(Gray Top)Covid-19 Tri-Sucrose Vaccine 09/06/2020   PFIZER(Purple Top)SARS-COV-2 Vaccination 06/17/2019, 07/12/2019, 01/20/2020   Pneumococcal Conjugate-13 05/28/2015   Pneumococcal Polysaccharide-23 06/25/2016   Tdap 06/16/2011    Conditions to be addressed/monitored:  Hypertension, Hyperlipidemia, Diabetes, Hypothyroidism, BPH, Gout, and Insomnia and ADHD  Care Plan : [employer]  Updates made by Viona Gilmore, [place] since 01/18/2021 12:00 AM     Problem: Problem: Hypertension, Hyperlipidemia, Diabetes, Hypothyroidism, BPH, Gout, and Insomnia and ADHD      Long-Range Goal: Patient-Specific Goal   Start Date: 01/07/2021  Expected End Date: 01/07/2022  This Visit's Progress: On track  Priority: High  Note:   Current Barriers:  Unable to independently monitor therapeutic efficacy Unable to achieve control of cholesterol and blood pressure   Pharmacist Clinical Goal(s):  Patient will achieve adherence to monitoring guidelines and medication adherence to achieve therapeutic efficacy achieve control of cholesterol as evidenced by  next lipid panel maintain control of blood pressure as evidenced by BP readings  through collaboration with PharmD and provider.    Interventions: 1:1 collaboration with [name], NP regarding development and update of comprehensive plan of care as evidenced by provider attestation and co-signature Inter-disciplinary care team collaboration (see longitudinal plan of care) Comprehensive medication review performed; medication list updated in electronic medical record  Hypertension (BP goal <140/90) -Uncontrolled -Current treatment: Metoprolol tartrate 25 mg 1 tablet daily Triamterene-HCTZ 75-50 mg 1 tablet daily -Medications previously tried: lisinopril  -Current home readings: has an arm cuff at home but does not use -Current dietary habits: never been a salt user; cooks with salt some; wife looks at package labels -Current exercise habits: active at work but no formal exercise -Denies hypotensive/hypertensive symptoms -Educated on Exercise goal of 150 minutes per week; Importance of home blood pressure monitoring; Proper BP monitoring technique; Symptoms of hypotension and importance of maintaining adequate hydration; -Counseled to monitor BP at home weekly, document, and provide log at future appointments -Counseled on diet and exercise extensively Recommended to continue current medication  Hyperlipidemia: (LDL goal < 100) -Uncontrolled -Current treatment: No medications -Medications previously tried: none  -Current dietary patterns: grills and eats a lot of fish -Current exercise habits: active at work -Educated on Cholesterol goals;  Benefits of statin for ASCVD risk reduction; Importance of limiting foods high in cholesterol; Exercise goal of 150 minutes per week; -Recommended repeat lipid panel and moderate intensity statin therapy if LDL > 100  Diabetes (A1c goal <7%) -Controlled -Current medications: Metformin 500 mg 1 tablet daily with breakfast - on hold -Medications previously tried: none  -Current home glucose readings fasting glucose: does not check post prandial glucose: does  not check -Denies hypoglycemic/hyperglycemic symptoms -Current meal patterns:  breakfast: n/a  lunch: n/a  dinner: n/a snacks: does eat some sweets drinks: diet soda; perrier and water -Current exercise: active at work -Educated on A1c and blood sugar goals; Exercise goal of 150 minutes per week; Carbohydrate counting and/or plate method -Counseled to check feet daily and get yearly eye exams -Counseled on diet and exercise extensively  Gout (Goal: uric acid < 6 and prevent flare ups) -Not ideally controlled -Current treatment  Allopurinol 300 mg 1 tablet daily -Medications previously tried: none  -Counseled on foods that can increase risk for gout such as red meat, alcohol, sugary drinks, and some seafood Recommended repeat uric acid level.  Hypothyroidism (Goal: 0.35-4.5) -Controlled -Current treatment  Levothyroxine 175 mcg 1 tablet daily -Medications previously tried: none  -Recommended to continue current medication Counseled on the importance of taking this on an empty stomach with water and 30 minutes prior to other medications and food.  Insomnia (Goal: improve quality and quantity of sleep) -Not ideally controlled -Current treatment  Trazodone 50 mg 1 tablet at bedtime as needed -Medications previously tried: none  -Counseled on practicing good sleep hygiene by setting a sleep schedule and maintaining it, avoid excessive napping, following a nightly routine, avoiding screen time for 30-60 minutes before going to bed, and making the bedroom a cool, quiet and dark space  Hair growth (Goal: improve hair growth) -Controlled -Current treatment  Finasteride 5 mg 1 tablet daily -Medications previously tried: none -Recommended to continue current medication  ADHD (Goal: improve attention) -Not ideally controlled -Current treatment  Adderall 10 mg 1 tablet daily -Medications previously tried: none  -Counseled on other medications that are non stimulants. Recommended  taper off Adderall and switching to [facility]  Maintenance -Vaccine gaps: shingrix, influenza, COVID booster -Current therapy:  Triamcinolone 0.1% cream as needed Podofilox 0.5% external solution Aleve 220 mg as needed Tylenol 500 mg as needed -Educated on Cost vs benefit of each product must be carefully weighed by individual consumer -Patient is satisfied with current therapy and denies issues -Recommended to continue current medication  Patient Goals/Self-Care Activities Patient will:  - take medications as prescribed check blood pressure weekly, document, and provide at future appointments target a minimum of 150 minutes of moderate intensity exercise weekly engage in dietary modifications by increasing intake of vegetables and other dietary fiber  Follow Up Plan: The care management team will reach out to the patient again over the next 30 days.       Medication Assistance: None required.  Patient affirms current coverage meets needs.  Compliance/Adherence/Medication fill history: Care Gaps: Hep C screening, shingrix, colonscopy, influenza, COVID booster  Star-Rating Drugs: Metformin 500 mg - [facility]  [facility]:  [id number]Phone: [phone]: [phone] [id number] [place]: [phone]: [phone] pill box? No - uses an old antique box and one side on in the top drawer of his chest  Pt endorses 100% compliance  We discussed: Current pharmacy is preferred with insurance plan and patient is satisfied with pharmacy services Patient decided to: Continue current medication management strategy  Care Plan and Follow Up Patient Decision:  Patient agrees to Care Plan and Follow-up.  Plan: The care management team will reach out  to the patient again over the next 30 days.  MaJeni SallesPharmD, BCSwainharmacist [place]

## 2021-01-14 ENCOUNTER — Telehealth: Payer: Self-pay | Admitting: Adult Health

## 2021-01-14 ENCOUNTER — Ambulatory Visit: Payer: Medicare HMO

## 2021-01-14 NOTE — Telephone Encounter (Signed)
Left message for patient to call back and schedule Medicare Annual Wellness Visit (AWV) either virtually or in office. Left  my Jose Evans number [phone]   awvi per palmentto 06/20/14 please schedule at anytime with [facility] Nurse Health Advisor 1 or 2   This should be a 45 minute visit.

## 2021-01-16 ENCOUNTER — Other Ambulatory Visit: Payer: Self-pay | Admitting: Adult Health

## 2021-01-16 DIAGNOSIS — E039 Hypothyroidism, unspecified: Secondary | ICD-10-CM

## 2021-01-17 ENCOUNTER — Ambulatory Visit (INDEPENDENT_AMBULATORY_CARE_PROVIDER_SITE_OTHER): Payer: Medicare HMO

## 2021-01-17 ENCOUNTER — Other Ambulatory Visit: Payer: Self-pay | Admitting: Adult Health

## 2021-01-17 ENCOUNTER — Other Ambulatory Visit: Payer: Self-pay

## 2021-01-17 ENCOUNTER — Encounter: Payer: Self-pay | Admitting: Adult Health

## 2021-01-17 VITALS — BP 138/78 | HR 72 | Temp 98.4°F | Ht 69.0 in | Wt 192.0 lb

## 2021-01-17 DIAGNOSIS — F988 Other specified behavioral and emotional disorders with onset usually occurring in childhood and adolescence: Secondary | ICD-10-CM

## 2021-01-17 DIAGNOSIS — Z0000 Encounter for general adult medical examination without abnormal findings: Secondary | ICD-10-CM | POA: Diagnosis not present

## 2021-01-17 MED ORDER — AMPHETAMINE-DEXTROAMPHETAMINE 10 MG PO TABS: 10.0000 mg | ORAL_TABLET | Freq: Every day | ORAL | 0 refills | Status: DC

## 2021-01-17 MED ORDER — AMPHETAMINE-DEXTROAMPHETAMINE 10 MG PO TABS
10.0000 mg | ORAL_TABLET | Freq: Every day | ORAL | 0 refills | Status: DC
Start: 2021-01-17 — End: 2021-04-16

## 2021-01-17 NOTE — Patient Instructions (Signed)
Jose Evans , Thank you for taking time to come for your Medicare Wellness Visit. I appreciate your ongoing commitment to your health goals. Please review the following plan we discussed and let me know if I can assist you in the future.   Screening recommendations/referrals: Colonoscopy: patient declined  Recommended yearly ophthalmology/optometry visit for glaucoma screening and checkup Recommended yearly dental visit for hygiene and checkup  Vaccinations: Influenza vaccine: due in fall 2022  Pneumococcal vaccine: completed series  Tdap vaccine: 06/16/2011 Shingles vaccine: will consider     Advanced directives: none   Conditions/risks identified: none   Next appointment: CPE 02/27/2021  830am Jose Evans  Preventive Care 73 Years and Older, Male Preventive care refers to lifestyle choices and visits with your health care provider that can promote health and wellness. What does preventive care include? A yearly physical exam. This is also called an annual well check. Dental exams once or twice a year. Routine eye exams. Ask your health care provider how often you should have your eyes checked. Personal lifestyle choices, including: Daily care of your teeth and gums. Regular physical activity. Eating a healthy diet. Avoiding tobacco and drug use. Limiting alcohol use. Practicing safe sex. Taking low doses of aspirin every day. Taking vitamin and mineral supplements as recommended by your health care provider. What happens during an annual well check? The services and screenings done by your health care provider during your annual well check will depend on your age, overall health, lifestyle risk factors, and family history of disease. Counseling  Your health care provider may ask you questions about your: Alcohol use. Tobacco use. Drug use. Emotional well-being. Home and relationship well-being. Sexual activity. Eating habits. History of falls. Memory and ability to  understand (cognition). Work and work Statistician. Screening  You may have the following tests or measurements: Height, weight, and BMI. Blood pressure. Lipid and cholesterol levels. These may be checked every 5 years, or more frequently if you are over 5 years old. Skin check. Lung cancer screening. You may have this screening every year starting at age 81 if you have a 30-pack-year history of smoking and currently smoke or have quit within the past 15 years. Fecal occult blood test (FOBT) of the stool. You may have this test every year starting at age 28. Flexible sigmoidoscopy or colonoscopy. You may have a sigmoidoscopy every 5 years or a colonoscopy every 10 years starting at age 72. Prostate cancer screening. Recommendations will vary depending on your family history and other risks. Hepatitis C blood test. Hepatitis B blood test. Sexually transmitted disease (STD) testing. Diabetes screening. This is done by checking your blood sugar (glucose) after you have not eaten for a while (fasting). You may have this done every 1-3 years. Abdominal aortic aneurysm (AAA) screening. You may need this if you are a current or former smoker. Osteoporosis. You may be screened starting at age 38 if you are at high risk. Talk with your health care provider about your test results, treatment options, and if necessary, the need for more tests. Vaccines  Your health care provider may recommend certain vaccines, such as: Influenza vaccine. This is recommended every year. Tetanus, diphtheria, and acellular pertussis (Tdap, Td) vaccine. You may need a Td booster every 10 years. Zoster vaccine. You may need this after age 62. Pneumococcal 13-valent conjugate (PCV13) vaccine. One dose is recommended after age 15. Pneumococcal polysaccharide (PPSV23) vaccine. One dose is recommended after age 27. Talk to your health care provider about  which screenings and vaccines you need and how often you need them. This  information is not intended to replace advice given to you by your health care provider. Make sure you discuss any questions you have with your health care provider. Document Released: 05/04/2015 Document Revised: 12/26/2015 Document Reviewed: 02/06/2015 Elsevier Interactive Patient Education  2017 [employer] Prevention in the Home Falls can cause injuries. They can happen to people of all ages. There are many things you can do to make your home safe and to help prevent falls. What can I do on the outside of my home? Regularly fix the edges of walkways and driveways and fix any cracks. Remove anything that might make you trip as you walk through a door, such as a raised step or threshold. Trim any bushes or trees on the path to your home. Use bright outdoor lighting. Clear any walking paths of anything that might make someone trip, such as rocks or tools. Regularly check to see if handrails are loose or broken. Make sure that both sides of any steps have handrails. Any raised decks and porches should have guardrails on the edges. Have any leaves, snow, or ice cleared regularly. Use sand or salt on walking paths during winter. Clean up any spills in your garage right away. This includes oil or grease spills. What can I do in the bathroom? Use night lights. Install grab bars by the toilet and in the tub and shower. Do not use towel bars as grab bars. Use non-skid mats or decals in the tub or shower. If you need to sit down in the shower, use a plastic, non-slip stool. Keep the floor dry. Clean up any water that spills on the floor as soon as it happens. Remove soap buildup in the tub or shower regularly. Attach bath mats securely with double-sided non-slip rug tape. Do not have throw rugs and other things on the floor that can make you trip. What can I do in the bedroom? Use night lights. Make sure that you have a light by your bed that is easy to reach. Do not use any sheets or  blankets that are too big for your bed. They should not hang down onto the floor. Have a firm chair that has side arms. You can use this for support while you get dressed. Do not have throw rugs and other things on the floor that can make you trip. What can I do in the kitchen? Clean up any spills right away. Avoid walking on wet floors. Keep items that you use a lot in easy-to-reach places. If you need to reach something above you, use a strong step stool that has a grab bar. Keep electrical cords out of the way. Do not use floor polish or wax that makes floors slippery. If you must use wax, use non-skid floor wax. Do not have throw rugs and other things on the floor that can make you trip. What can I do with my stairs? Do not leave any items on the stairs. Make sure that there are handrails on both sides of the stairs and use them. Fix handrails that are broken or loose. Make sure that handrails are as long as the stairways. Check any carpeting to make sure that it is firmly attached to the stairs. Fix any carpet that is loose or worn. Avoid having throw rugs at the top or bottom of the stairs. If you do have throw rugs, attach them to the floor with  carpet tape. Make sure that you have a light switch at the top of the stairs and the bottom of the stairs. If you do not have them, ask someone to add them for you. What else can I do to help prevent falls? Wear shoes that: Do not have high heels. Have rubber bottoms. Are comfortable and fit you well. Are closed at the toe. Do not wear sandals. If you use a stepladder: Make sure that it is fully opened. Do not climb a closed stepladder. Make sure that both sides of the stepladder are locked into place. Ask someone to hold it for you, if possible. Clearly mark and make sure that you can see: Any grab bars or handrails. First and last steps. Where the edge of each step is. Use tools that help you move around (mobility aids) if they are  needed. These include: Canes. Walkers. Scooters. Crutches. Turn on the lights when you go into a dark area. Replace any light bulbs as soon as they burn out. Set up your furniture so you have a clear path. Avoid moving your furniture around. If any of your floors are uneven, fix them. If there are any pets around you, be aware of where they are. Review your medicines with your doctor. Some medicines can make you feel dizzy. This can increase your chance of falling. Ask your doctor what other things that you can do to help prevent falls. This information is not intended to replace advice given to you by your health care provider. Make sure you discuss any questions you have with your health care provider. Document Released: 02/01/2009 Document Revised: 09/13/2015 Document Reviewed: 05/12/2014 Elsevier Interactive Patient Education  2017 [employer].

## 2021-01-17 NOTE — Progress Notes (Signed)
Subjective:   Jose Evans is a 75 y.o. male who presents for an Initial Medicare Annual Wellness Visit.  Review of Systems    N/a Cardiac Risk Factors include: advanced age (>26men, >38 women);dyslipidemia;hypertension;male gender     Objective:    Today's Vitals   01/17/21 1303  BP: 138/78  Pulse: 72  Temp: 98.4 F (36.9 C)  SpO2: 98%  Weight: 192 lb (87.1 kg)  Height: 5\' 9"  (1.753 m)   Body mass index is 28.35 kg/m.  Advanced Directives 01/17/2021 02/09/2020 09/08/2017  Does Patient Have a Medical Advance Directive? No No -  Would patient like information on creating a medical advance directive? - No - Patient declined No - Patient declined    Current Medications (verified) Outpatient Encounter Medications as of 01/17/2021  Medication Sig   allopurinol (ZYLOPRIM) 300 MG tablet TAKE 1 TABLET BY MOUTH EVERY DAY   amphetamine-dextroamphetamine (ADDERALL) 10 MG tablet Take 1 tablet (10 mg total) by mouth daily.   amphetamine-dextroamphetamine (ADDERALL) 10 MG tablet Take 1 tablet (10 mg total) by mouth daily.   finasteride (PROSCAR) 5 MG tablet TAKE 1 TABLET BY MOUTH EVERY DAY   fluoruracil (CARAC) 0.5 % cream Apply 1 application topically daily as needed.   levothyroxine (SYNTHROID) 175 MCG tablet TAKE 1 TABLET BY MOUTH EVERY DAY   meloxicam (MOBIC) 7.5 MG tablet Take 1 tablet by mouth daily as needed.   metoprolol tartrate (LOPRESSOR) 25 MG tablet Take 1 tablet (25 mg total) by mouth daily.   naproxen sodium (ALEVE) 220 MG tablet Take 220 mg by mouth.   traZODone (DESYREL) 50 MG tablet Take 1 tablet (50 mg total) by mouth at bedtime as needed for sleep.   triamcinolone cream (KENALOG) 0.1 % Apply 1 application topically 2 (two) times daily.   triamterene-hydrochlorothiazide (MAXZIDE) 75-50 MG tablet TAKE 1 TABLET BY MOUTH EVERY DAY   amphetamine-dextroamphetamine (ADDERALL) 10 MG tablet Take 1 tablet (10 mg total) by mouth daily.   lisinopril (ZESTRIL) 5 MG tablet TAKE  1 TABLET BY MOUTH EVERY DAY (Patient not taking: No sig reported)   metFORMIN (GLUCOPHAGE) 500 MG tablet Take 1 tablet (500 mg total) by mouth daily with breakfast. (Patient not taking: Reported on 01/07/2021)   podofilox (CONDYLOX) 0.5 % external solution Apply topically 2 (two) times daily. Twice a day for 3 days and then stop for four days. Can be repeated 4 times (Patient not taking: No sig reported)   No facility-administered encounter medications on file as of 01/17/2021.    Allergies (verified) Patient has no known allergies.   History: Past Medical History:  Diagnosis Date   Adult acne    Cancer ([place]) 07/2017   skin cancer   Gout    Hypertension    Thyroid disease    Past Surgical History:  Procedure Laterality Date   HERNIA REPAIR     SHOULDER SURGERY     TONSILLECTOMY     Family History  Problem Relation Age of Onset   Hypertension Other    Social History   Socioeconomic History   Marital status: Married    Spouse name: Not on file   Number of children: Not on file   Years of education: Not on file   Highest education level: Not on file  Occupational History   Not on file  Tobacco Use   Smoking status: Former    Packs/day: 0.50    Years: 30.00    Pack years: 15.00    Types: Cigarettes  Quit date: 09/07/2005    Years since quitting: 15.3   Smokeless tobacco: Never  Vaping Use   Vaping Use: Never used  Substance and Sexual Activity   Alcohol use: Yes    Comment: FEW SHOTS A WEEK   Drug use: No   Sexual activity: Not on file  Other Topics Concern   Not on file  Social History Narrative   Works for [employer]    Married   m   Social Determinants of Radio broadcast assistant Strain: Low Risk    Difficulty of Paying Living Expenses: Not hard at all  Food Insecurity: No Food Insecurity   Worried About Charity fundraiser in the Last Year: Never true   Arboriculturist in the Last Year: Never true  Transportation Needs: No  Transportation Needs   Lack of Transportation (Medical): No   Lack of Transportation (Non-Medical): No  Physical Activity: Insufficiently Active   Days of Exercise per Week: 3 days   Minutes of Exercise per Session: 40 min  Stress: No Stress Concern Present   Feeling of Stress : Not at all  Social Connections: Moderately Isolated   Frequency of Communication with Friends and Family: Twice a week   Frequency of Social Gatherings with Friends and Family: Twice a week   Attends Religious Services: Never   Printmaker: No   Attends Music therapist: Never   Marital Status: Married    Tobacco Counseling Counseling given: Not Answered   Clinical Intake:  Pre-visit preparation completed: Yes  Pain : No/denies pain     Nutritional Risks: None Diabetes: No  How often do you need to have someone help you when you read instructions, pamphlets, or other written materials from your doctor or pharmacy?: 1 - Never What is the last grade level you completed in school?: college  Diabetic?no  Interpreter Needed?: No  Information entered by :: [place] of Daily Living In your present state of health, do you have any difficulty performing the following activities: 01/17/2021  Hearing? N  Vision? N  Difficulty concentrating or making decisions? N  Walking or climbing stairs? N  Dressing or bathing? N  Doing errands, shopping? N  Preparing Food and eating ? N  Using the Toilet? N  In the past six months, have you accidently leaked urine? N  Do you have problems with loss of bowel control? N  Managing your Medications? N  Managing your Finances? N  Housekeeping or managing your Housekeeping? N  Some recent data might be hidden    Patient Care Team: Jose Peng, NP as PCP - General (Family Medicine) Jose Evans, [facility] as Pharmacist (Pharmacist)  Indicate any recent Medical Services you may have received from other  than [facility] providers in the past year (date may be approximate).     Assessment:   This is a routine wellness examination for [name].  Hearing/Vision screen No results found.  Dietary issues and exercise activities discussed: Current Exercise Habits: Home exercise routine, Type of exercise: walking, Time (Minutes): 40, Frequency (Times/Week): 3, Weekly Exercise (Minutes/Week): 120, Intensity: Mild, Exercise limited by: None identified   Goals Addressed             This Visit's Progress    Develop a Weight Loss Readiness Plan       Timeframe:  Long-Range Goal Priority:  Low Start Date:  Expected End Date:                       Follow Up Date 01/17/2022   - begin a weight loss diary    Why is this important?   Losing weight requires time to prepare your mind and your home.  Identify your eating habits and the emotions attached to eating.  Think about ways to be successful.  When you are not ready, you could lose motivation and give up on your plan.     Notes:        Depression Screen PHQ 2/9 Scores 01/17/2021 01/17/2021 01/26/2020 12/30/2017 05/28/2015 03/13/2014  PHQ - 2 Score 0 0 0 0 0 0    Fall Risk Fall Risk  01/17/2021 01/26/2020 12/30/2017 05/28/2015 03/13/2014  Falls in the past year? 0 0 No No No  Number falls in past yr: 0 0 - - -  Injury with Fall? 0 - - - -  Follow up Falls evaluation completed - - - -    FALL RISK PREVENTION PERTAINING TO THE HOME:  Any stairs in or around the home? Yes  If so, are there any without handrails? No  Home free of loose throw rugs in walkways, pet beds, electrical cords, etc? Yes  Adequate lighting in your home to reduce risk of falls? Yes   ASSISTIVE DEVICES UTILIZED TO PREVENT FALLS:  Life alert? No  Use of a cane, walker or w/c? No  Grab bars in the bathroom? No  Shower chair or bench in shower? Yes  Elevated toilet seat or a handicapped toilet? No   TIMED UP AND GO:  Was the test  performed? Yes .  Length of time to ambulate 10 feet: 8 sec.   Gait steady and fast without use of assistive device  Cognitive Function:  Normal cognitive status assessed by direct observation by this Nurse Health Advisor. No abnormalities found.          Immunizations Immunization History  Administered Date(s) Administered   Fluad Quad(high Dose 65+) 12/09/2018, 01/26/2020   Influenza Whole 03/05/2010   Influenza, High Dose Seasonal PF 03/13/2014, 05/28/2015, 12/30/2017   Influenza,inj,Quad PF,6+ Mos 02/09/2013   PFIZER Comirnaty(Gray Top)Covid-19 Tri-Sucrose Vaccine 09/06/2020   PFIZER(Purple Top)SARS-COV-2 Vaccination 06/17/2019, 07/12/2019, 01/20/2020   Pneumococcal Conjugate-13 05/28/2015   Pneumococcal Polysaccharide-23 06/25/2016   Tdap 06/16/2011    TDAP status: Up to date  Flu Vaccine status: Up to date  Pneumococcal vaccine status: Up to date  Covid-19 vaccine status: Completed vaccines  Qualifies for Shingles Vaccine? Yes   Zostavax completed No   Shingrix Completed?: No.    Education has been provided regarding the importance of this vaccine. Patient has been advised to call insurance company to determine out of pocket expense if they have not yet received this vaccine. Advised may also receive vaccine at local pharmacy or Health Dept. Verbalized acceptance and understanding.  Screening Tests Health Maintenance  Topic Date Due   Hepatitis C Screening  Never done   Zoster Vaccines- Shingrix (1 of 2) Never done   COLONOSCOPY (Pts 45-55yrs Insurance coverage will need to be confirmed)  Never done   INFLUENZA VACCINE  11/19/2020   COVID-19 Vaccine (5 - Booster for Pfizer series) 01/07/2021   TETANUS/TDAP  06/15/2021   HPV VACCINES  Aged Out    Health Maintenance  Health Maintenance Due  Topic Date Due   Hepatitis C Screening  Never done   Zoster Vaccines- Shingrix (1  of 2) Never done   COLONOSCOPY (Pts 45-38yrs Insurance coverage will need to be  confirmed)  Never done   INFLUENZA VACCINE  11/19/2020   COVID-19 Vaccine (5 - Booster for [employer] series) 01/07/2021    Colorectal cancer screening: Referral to GI placed patient declined . Pt aware the office will call re: appt.  Lung Cancer Screening: (Low Dose CT Chest recommended if Age 10-80 years, 30 pack-year currently smoking OR have quit w/in 15years.) does not qualify.   Lung Cancer Screening Referral: n/a  Additional Screening:  Hepatitis C Screening: does qualify;   Vision Screening: Recommended annual ophthalmology exams for early detection of glaucoma and other disorders of the eye. Is the patient up to date with their annual eye exam?  Yes  Who is the provider or what is the name of the office in which the patient attends annual eye exams? [facility]  If pt is not established with a provider, would they like to be referred to a provider to establish care? No .   Dental Screening: Recommended annual dental exams for proper oral hygiene  Community Resource Referral / Chronic Care Management: CRR required this visit?  No   CCM required this visit?  No      Plan:     I have personally reviewed and noted the following in the patient's chart:   Medical and social history Use of alcohol, tobacco or illicit drugs  Current medications and supplements including opioid prescriptions. Patient is not currently taking opioid prescriptions. Functional ability and status Nutritional status Physical activity Advanced directives List of other physicians Hospitalizations, surgeries, and ER visits in previous 12 months Vitals Screenings to include cognitive, depression, and falls Referrals and appointments  In addition, I have reviewed and discussed with patient certain preventive protocols, quality metrics, and best practice recommendations. A written personalized care plan for preventive services as well as general preventive health recommendations were provided to  patient.     Randel Pigg, LPN   12/07/2991   Nurse Notes: none

## 2021-01-18 DIAGNOSIS — E039 Hypothyroidism, unspecified: Secondary | ICD-10-CM

## 2021-01-18 DIAGNOSIS — I10 Essential (primary) hypertension: Secondary | ICD-10-CM | POA: Diagnosis not present

## 2021-01-18 NOTE — Patient Instructions (Signed)
Hi Jose Evans,  It was great to get to meet you in person! Below is a summary of some of the topics we discussed.  I will reach out to you once I make a plan with [facility].  Please reach out to me if you have any questions or need anything before our follow up!  Best, Maddie  Jeni Salles, PharmD, [facility]   Visit Information   Goals Addressed             This Visit's Progress    Track and Manage My Blood Pressure-Hypertension       Timeframe:  Long-Range Goal Priority:  Medium Start Date:                             Expected End Date:                       Follow Up Date 04/19/21    - check blood pressure weekly - choose a place to take my blood pressure (home, clinic or office, retail store) - write blood pressure results in a log or diary    Why is this important?   You won't feel high blood pressure, but it can still hurt your blood vessels.  High blood pressure can cause heart or kidney problems. It can also cause a stroke.  Making lifestyle changes like losing a little weight or eating less salt will help.  Checking your blood pressure at home and at different times of the day can help to control blood pressure.  If the doctor prescribes medicine remember to take it the way the doctor ordered.  Call the office if you cannot afford the medicine or if there are questions about it.     Notes:        Patient Care Plan: CCM Pharmacy Care Plan     Problem Identified: Problem: Hypertension, Hyperlipidemia, Diabetes, Hypothyroidism, BPH, Gout, and Insomnia and ADHD      Long-Range Goal: Patient-Specific Goal   Start Date: 01/07/2021  Expected End Date: 01/07/2022  This Visit's Progress: On track  Priority: High  Note:   Current Barriers:  Unable to independently monitor therapeutic efficacy Unable to achieve control of cholesterol and blood pressure   Pharmacist Clinical Goal(s):  Patient will achieve  adherence to monitoring guidelines and medication adherence to achieve therapeutic efficacy achieve control of cholesterol as evidenced by next lipid panel maintain control of blood pressure as evidenced by BP readings  through collaboration with PharmD and provider.   Interventions: 1:1 collaboration with [name], NP regarding development and update of comprehensive plan of care as evidenced by provider attestation and co-signature Inter-disciplinary care team collaboration (see longitudinal plan of care) Comprehensive medication review performed; medication list updated in electronic medical record  Hypertension (BP goal <140/90) -Uncontrolled -Current treatment: Metoprolol tartrate 25 mg 1 tablet daily Triamterene-HCTZ 75-50 mg 1 tablet daily -Medications previously tried: lisinopril  -Current home readings: has an arm cuff at home but does not use -Current dietary habits: never been a salt user; cooks with salt some; wife looks at package labels -Current exercise habits: active at work but no formal exercise -Denies hypotensive/hypertensive symptoms -Educated on Exercise goal of 150 minutes per week; Importance of home blood pressure monitoring; Proper BP monitoring technique; Symptoms of hypotension and importance of maintaining adequate hydration; -Counseled to monitor BP at home weekly, document, and provide  log at future appointments -Counseled on diet and exercise extensively Recommended to continue current medication  Hyperlipidemia: (LDL goal < 100) -Uncontrolled -Current treatment: No medications -Medications previously tried: none  -Current dietary patterns: grills and eats a lot of fish -Current exercise habits: active at work -Educated on Cholesterol goals;  Benefits of statin for ASCVD risk reduction; Importance of limiting foods high in cholesterol; Exercise goal of 150 minutes per week; -Recommended repeat lipid panel and moderate intensity statin  therapy if LDL > 100  Diabetes (A1c goal <7%) -Controlled -Current medications: Metformin 500 mg 1 tablet daily with breakfast - on hold -Medications previously tried: none  -Current home glucose readings fasting glucose: does not check post prandial glucose: does not check -Denies hypoglycemic/hyperglycemic symptoms -Current meal patterns:  breakfast: n/a  lunch: n/a  dinner: n/a snacks: does eat some sweets drinks: diet soda; perrier and water -Current exercise: active at work -Educated on A1c and blood sugar goals; Exercise goal of 150 minutes per week; Carbohydrate counting and/or plate method -Counseled to check feet daily and get yearly eye exams -Counseled on diet and exercise extensively  Gout (Goal: uric acid < 6 and prevent flare ups) -Not ideally controlled -Current treatment  Allopurinol 300 mg 1 tablet daily -Medications previously tried: none  -Counseled on foods that can increase risk for gout such as red meat, alcohol, sugary drinks, and some seafood Recommended repeat uric acid level.  Hypothyroidism (Goal: 0.35-4.5) -Controlled -Current treatment  Levothyroxine 175 mcg 1 tablet daily -Medications previously tried: none  -Recommended to continue current medication Counseled on the importance of taking this on an empty stomach with water and 30 minutes prior to other medications and food.  Insomnia (Goal: improve quality and quantity of sleep) -Not ideally controlled -Current treatment  Trazodone 50 mg 1 tablet at bedtime as needed -Medications previously tried: none  -Counseled on practicing good sleep hygiene by setting a sleep schedule and maintaining it, avoid excessive napping, following a nightly routine, avoiding screen time for 30-60 minutes before going to bed, and making the bedroom a cool, quiet and dark space  Hair growth (Goal: improve hair growth) -Controlled -Current treatment  Finasteride 5 mg 1 tablet daily -Medications previously  tried: none -Recommended to continue current medication  ADHD (Goal: improve attention) -Not ideally controlled -Current treatment  Adderall 10 mg 1 tablet daily -Medications previously tried: none  -Counseled on other medications that are non stimulants. Recommended taper off Adderall and switching to Strattera  Health Maintenance -Vaccine gaps: shingrix, influenza, COVID booster -Current therapy:  Triamcinolone 0.1% cream as needed Podofilox 0.5% external solution Aleve 220 mg as needed Tylenol 500 mg as needed -Educated on Cost vs benefit of each product must be carefully weighed by individual consumer -Patient is satisfied with current therapy and denies issues -Recommended to continue current medication  Patient Goals/Self-Care Activities Patient will:  - take medications as prescribed check blood pressure weekly, document, and provide at future appointments target a minimum of 150 minutes of moderate intensity exercise weekly engage in dietary modifications by increasing intake of vegetables and other dietary fiber  Follow Up Plan: The care management team will reach out to the patient again over the next 30 days.       Mr. Loveall was given information about Chronic Care Management services today including:  CCM service includes personalized support from designated clinical staff supervised by his physician, including individualized plan of care and coordination with other care providers 24/7 contact phone numbers for assistance  for urgent and routine care needs. Standard insurance, coinsurance, copays and deductibles apply for chronic care management only during months in which we provide at least 20 minutes of these services. Most insurances cover these services at 100%, however patients may be responsible for any copay, coinsurance and/or deductible if applicable. This service may help you avoid the need for more expensive face-to-face services. Only one practitioner may  furnish and bill the service in a calendar month. The patient may stop CCM services at any time (effective at the end of the month) by phone call to the office staff.  Patient agreed to services and verbal consent obtained.   Patient verbalizes understanding of instructions provided today and agrees to view in [place].  The pharmacy team will reach out to the patient again over the next 30 days.   Viona Gilmore, [facility]

## 2021-01-21 ENCOUNTER — Ambulatory Visit: Payer: Medicare HMO

## 2021-01-24 DIAGNOSIS — R000 Tachycardia, unspecified: Secondary | ICD-10-CM | POA: Diagnosis not present

## 2021-01-25 ENCOUNTER — Telehealth: Payer: Self-pay | Admitting: Adult Health

## 2021-01-25 LAB — COLOGUARD

## 2021-01-25 NOTE — Telephone Encounter (Signed)
Left VM to call back regarding results from cardiac monitor

## 2021-01-27 ENCOUNTER — Other Ambulatory Visit: Payer: Self-pay

## 2021-01-27 ENCOUNTER — Encounter (HOSPITAL_BASED_OUTPATIENT_CLINIC_OR_DEPARTMENT_OTHER): Payer: Self-pay | Admitting: Emergency Medicine

## 2021-01-27 ENCOUNTER — Emergency Department (HOSPITAL_BASED_OUTPATIENT_CLINIC_OR_DEPARTMENT_OTHER): Payer: Medicare HMO | Admitting: Radiology

## 2021-01-27 ENCOUNTER — Emergency Department (HOSPITAL_BASED_OUTPATIENT_CLINIC_OR_DEPARTMENT_OTHER)
Admission: EM | Admit: 2021-01-27 | Discharge: 2021-01-27 | Disposition: A | Discharge status: Home / Self Care | Payer: Medicare HMO | Attending: Emergency Medicine | Admitting: Emergency Medicine

## 2021-01-27 DIAGNOSIS — R0781 Pleurodynia: Secondary | ICD-10-CM | POA: Diagnosis not present

## 2021-01-27 DIAGNOSIS — Y93F2 Activity, caregiving, lifting: Secondary | ICD-10-CM | POA: Diagnosis not present

## 2021-01-27 DIAGNOSIS — S4992XA Unspecified injury of left shoulder and upper arm, initial encounter: Secondary | ICD-10-CM | POA: Diagnosis present

## 2021-01-27 DIAGNOSIS — W108XXA Fall (on) (from) other stairs and steps, initial encounter: Secondary | ICD-10-CM | POA: Diagnosis not present

## 2021-01-27 DIAGNOSIS — Z85828 Personal history of other malignant neoplasm of skin: Secondary | ICD-10-CM | POA: Insufficient documentation

## 2021-01-27 DIAGNOSIS — E039 Hypothyroidism, unspecified: Secondary | ICD-10-CM | POA: Diagnosis not present

## 2021-01-27 DIAGNOSIS — Z79899 Other long term (current) drug therapy: Secondary | ICD-10-CM | POA: Diagnosis not present

## 2021-01-27 DIAGNOSIS — I10 Essential (primary) hypertension: Secondary | ICD-10-CM | POA: Insufficient documentation

## 2021-01-27 DIAGNOSIS — M25522 Pain in left elbow: Secondary | ICD-10-CM | POA: Diagnosis not present

## 2021-01-27 DIAGNOSIS — W19XXXA Unspecified fall, initial encounter: Secondary | ICD-10-CM

## 2021-01-27 DIAGNOSIS — M79602 Pain in left arm: Secondary | ICD-10-CM | POA: Diagnosis not present

## 2021-01-27 DIAGNOSIS — Z87891 Personal history of nicotine dependence: Secondary | ICD-10-CM | POA: Diagnosis not present

## 2021-01-27 DIAGNOSIS — S20212A Contusion of left front wall of thorax, initial encounter: Secondary | ICD-10-CM | POA: Insufficient documentation

## 2021-01-27 DIAGNOSIS — S40022A Contusion of left upper arm, initial encounter: Secondary | ICD-10-CM | POA: Diagnosis not present

## 2021-01-27 DIAGNOSIS — S41112A Laceration without foreign body of left upper arm, initial encounter: Secondary | ICD-10-CM | POA: Insufficient documentation

## 2021-01-27 MED ORDER — LIDOCAINE HCL URETHRAL/MUCOSAL 2 % EX GEL
1.0000 "application " | Freq: Once | CUTANEOUS | Status: AC
  Administered 2021-01-27: 1 via TOPICAL
  Filled 2021-01-27: qty 11

## 2021-01-27 MED ORDER — HYDROCODONE-ACETAMINOPHEN 5-325 MG PO TABS: 1.0000 | ORAL_TABLET | ORAL | 0 refills | Status: DC | PRN

## 2021-01-27 MED ORDER — IBUPROFEN 400 MG PO TABS
600.0000 mg | ORAL_TABLET | Freq: Once | ORAL | Status: AC
  Administered 2021-01-27: 600 mg via ORAL
  Filled 2021-01-27: qty 1

## 2021-01-27 MED ORDER — HYDROCODONE-ACETAMINOPHEN 5-325 MG PO TABS
1.0000 | ORAL_TABLET | Freq: Once | ORAL | Status: AC
  Administered 2021-01-27: 1 via ORAL
  Filled 2021-01-27: qty 1

## 2021-01-27 MED ORDER — BACITRACIN ZINC 500 UNIT/GM EX OINT
TOPICAL_OINTMENT | Freq: Once | CUTANEOUS | Status: AC
  Administered 2021-01-27: 1 via TOPICAL
  Filled 2021-01-27: qty 28.35

## 2021-01-27 NOTE — ED Provider Notes (Signed)
[facility] EMERGENCY DEPT Provider Note   CSN: [id number] Arrival date & time: 01/27/21  1607     History Chief Complaint  Patient presents with   Arm Injury    Jose Evans is a 75 y.o. male.  Pt presents to the ED today with a fall.  Pt said he and his family member were carrying a cast iron sink up some stairs.  He slipped and fell down 10 carpeted stairs.  He has pain to his left rib and left arm.  He does not think he hit his head. He had no loc.  No n/v or dizziness.  He is not on blood thinners.      Past Medical History:  Diagnosis Date   Adult acne    Cancer ([place]) 07/2017   skin cancer   Gout    Hypertension    Thyroid disease     Patient Active Problem List   Diagnosis Date Noted   Peripheral neuropathy 07/26/2019   Generalized abdominal pain 12/30/2017   Chronic right shoulder pain 07/13/2017   Spinal stenosis of lumbar region with neurogenic claudication 01/13/2017   Routine general medical examination at a health care facility 05/28/2015   Capillary hemangioma of skin 03/13/2014   Dysplastic nevus of face 07/01/2011   SEBORRHEIC KERATOSIS, INFLAMED 05/06/2010   Attention deficit disorder 11/21/2009   Hypothyroidism 12/01/2006   Gout 12/01/2006   Essential hypertension 12/01/2006    Past Surgical History:  Procedure Laterality Date   HERNIA REPAIR     SHOULDER SURGERY     TONSILLECTOMY         Family History  Problem Relation Age of Onset   Hypertension Other     Social History   Tobacco Use   Smoking status: Former    Packs/day: 0.50    Years: 30.00    Pack years: 15.00    Types: Cigarettes    Quit date: 09/07/2005    Years since quitting: 15.4   Smokeless tobacco: Never  Vaping Use   Vaping Use: Never used  Substance Use Topics   Alcohol use: Yes    Comment: FEW SHOTS A WEEK   Drug use: No    Home Medications Prior to Admission medications   Medication Sig Start Date End Date Taking? Authorizing Provider   HYDROcodone-acetaminophen (NORCO/VICODIN) 5-325 MG tablet Take 1 tablet by mouth every 4 (four) hours as needed. 01/27/21  Yes Isla Pence, MD  allopurinol (ZYLOPRIM) 300 MG tablet TAKE 1 TABLET BY MOUTH EVERY DAY 08/28/20   Nafziger, Tommi Rumps, NP  amphetamine-dextroamphetamine (ADDERALL) 10 MG tablet Take 1 tablet (10 mg total) by mouth daily. 01/17/21 02/17/21  Nafziger, Tommi Rumps, NP  amphetamine-dextroamphetamine (ADDERALL) 10 MG tablet Take 1 tablet (10 mg total) by mouth daily. 01/17/21 01/17/22  Nafziger, Tommi Rumps, NP  amphetamine-dextroamphetamine (ADDERALL) 10 MG tablet Take 1 tablet (10 mg total) by mouth daily. 01/17/21 01/17/22  Nafziger, Tommi Rumps, NP  finasteride (PROSCAR) 5 MG tablet TAKE 1 TABLET BY MOUTH EVERY DAY 08/28/20   Nafziger, Tommi Rumps, NP  fluoruracil [facility]) 0.5 % cream Apply 1 application topically daily as needed.    [provider]  levothyroxine (SYNTHROID) 175 MCG tablet TAKE 1 TABLET BY MOUTH EVERY DAY 01/16/21   Nafziger, Tommi Rumps, NP  lisinopril (ZESTRIL) 5 MG tablet TAKE 1 TABLET BY MOUTH EVERY DAY Patient not taking: No sig reported 07/12/19   [name], NP  meloxicam (MOBIC) 7.5 MG tablet Take 1 tablet by mouth daily as needed. 06/05/17   [provider]  metFORMIN (GLUCOPHAGE) 500 MG tablet Take 1 tablet (500 mg total) by mouth daily with breakfast. Patient not taking: Reported on 01/07/2021 01/27/20 04/26/20  [name], NP  metoprolol tartrate (LOPRESSOR) 25 MG tablet Take 1 tablet (25 mg total) by mouth daily. 08/28/20   Nafziger, Tommi Rumps, NP  naproxen sodium (ALEVE) 220 MG tablet Take 220 mg by mouth.    [provider]  podofilox (CONDYLOX) 0.5 % external solution Apply topically 2 (two) times daily. Twice a day for 3 days and then stop for four days. Can be repeated 4 times Patient not taking: No sig reported 03/01/20   [name], NP  traZODone (DESYREL) 50 MG tablet Take 1 tablet (50 mg total) by mouth at bedtime as needed for sleep. 10/30/20    Nafziger, Tommi Rumps, NP  triamcinolone cream (KENALOG) 0.1 % Apply 1 application topically 2 (two) times daily. 12/09/18   Nafziger, Tommi Rumps, NP  triamterene-hydrochlorothiazide (MAXZIDE) 75-50 MG tablet TAKE 1 TABLET BY MOUTH EVERY DAY 12/10/20   [name], NP    Allergies    Patient has no known allergies.  Review of Systems   Review of Systems  Musculoskeletal:        Left rib pain, left upper arm and elbow pain  Skin:  Positive for wound.  All other systems reviewed and are negative.  Physical Exam Updated Vital Signs BP (!) 153/91 (BP Location: Right Arm)   Pulse (!) 56   Temp (!) 97.4 F (36.3 C) (Temporal)   Resp 18   Ht 5\' 9"  (1.753 m)   Wt 82.1 kg   SpO2 100%   BMI 26.73 kg/m   Physical Exam Vitals and nursing note reviewed.  Constitutional:      Appearance: Normal appearance.  HENT:     Head: Normocephalic and atraumatic.     Right Ear: External ear normal.     Left Ear: External ear normal.     Nose: Nose normal.     Mouth/Throat:     Mouth: Mucous membranes are moist.     Pharynx: Oropharynx is clear.  Eyes:     Extraocular Movements: Extraocular movements intact.     Conjunctiva/sclera: Conjunctivae normal.     Pupils: Pupils are equal, round, and reactive to light.  Cardiovascular:     Rate and Rhythm: Normal rate and regular rhythm.     Pulses: Normal pulses.     Heart sounds: Normal heart sounds.  Pulmonary:     Effort: Pulmonary effort is normal.     Breath sounds: Normal breath sounds.  Chest:     Comments: Contusion to lateral chest wall Abdominal:     General: Abdomen is flat. Bowel sounds are normal.     Palpations: Abdomen is soft.  Musculoskeletal:        General: Normal range of motion.     Cervical back: Normal range of motion and neck supple.  Skin:    Capillary Refill: Capillary refill takes less than 2 seconds.     Comments: Large skin tear to left upper arm  Neurological:     General: No focal deficit present.     Mental Status:  He is alert and oriented to person, place, and time.  Psychiatric:        Mood and Affect: Mood normal.        Behavior: Behavior normal.    ED Results / Procedures / Treatments   Labs (all labs ordered are listed, but only abnormal results  are displayed) Labs Reviewed - No data to display  EKG None  Radiology DG Ribs Unilateral W/Chest Left  Result Date: 01/27/2021 CLINICAL DATA:  Fall down stairs.  Left rib pain EXAM: LEFT RIBS AND CHEST - 3+ VIEW COMPARISON:  None. FINDINGS: No fracture or other bone lesions are seen involving the ribs. There is no evidence of pneumothorax or pleural effusion. Both lungs are clear. Heart size and mediastinal contours are within normal limits. IMPRESSION: Negative. Electronically Signed   By: Rolm Baptise M.D.   On: 01/27/2021 18:01   DG Elbow Complete Left  Result Date: 01/27/2021 CLINICAL DATA:  Fall, left elbow pain. EXAM: LEFT ELBOW - COMPLETE 3+ VIEW COMPARISON:  None. FINDINGS: There is no evidence of fracture, dislocation, or joint effusion. There is no evidence of arthropathy or other focal bone abnormality. Soft tissues are unremarkable. IMPRESSION: Negative. Electronically Signed   By: Rolm Baptise M.D.   On: 01/27/2021 18:02   DG Humerus Left  Result Date: 01/27/2021 CLINICAL DATA:  Fall, left arm pain EXAM: LEFT HUMERUS - 2+ VIEW COMPARISON:  None. FINDINGS: There is no evidence of fracture or other focal bone lesions. Soft tissues are unremarkable. IMPRESSION: Negative. Electronically Signed   By: Rolm Baptise M.D.   On: 01/27/2021 18:02    Procedures Procedures   Medications Ordered in ED Medications  lidocaine (XYLOCAINE) 2 % jelly 1 application (1 application Topical Given 01/27/21 1920)  HYDROcodone-acetaminophen (NORCO/VICODIN) 5-325 MG per tablet 1 tablet (1 tablet Oral Given 01/27/21 1918)  ibuprofen (ADVIL) tablet 600 mg (600 mg Oral Given 01/27/21 1918)  bacitracin ointment (1 application Topical Given 01/27/21 1920)    ED  Course  I have reviewed the triage vital signs and the nursing notes.  Pertinent labs & imaging results that were available during my care of the patient were reviewed by me and considered in my medical decision making (see chart for details).    MDM Rules/Calculators/A&P                           X-rays do not show fx.  Skin tear debrided and dressed.  Pt is stable for d/c.  Return if worse.  F/u with pcp. Final Clinical Impression(s) / ED Diagnoses Final diagnoses:  Skin tear of left upper arm without complication, initial encounter  Fall, initial encounter  Contusion of rib on left side, initial encounter  Contusion of left chest wall, initial encounter    Rx / DC Orders ED Discharge Orders          Ordered    HYDROcodone-acetaminophen (NORCO/VICODIN) 5-325 MG tablet  Every 4 hours PRN        01/27/21 2001             Isla Pence, MD 01/27/21 2001

## 2021-01-27 NOTE — ED Notes (Signed)
Dessing applied to left arm, bacitracin applied, telfa applied, wrapped with Kerlix and covered with Ace wrap. Pt tolerated well

## 2021-01-27 NOTE — ED Triage Notes (Signed)
Pt tripped and fell down about 10 stairs today while carrying a sink, presents with large skin tear to left upper arm/elbow.  Clean dressing applied in triage, minimal active bleeding at present.  Pt denies LOC or head injury.  Pt also c/o left side rib pain.

## 2021-01-30 ENCOUNTER — Other Ambulatory Visit: Payer: Self-pay

## 2021-01-31 ENCOUNTER — Ambulatory Visit (INDEPENDENT_AMBULATORY_CARE_PROVIDER_SITE_OTHER): Payer: Medicare HMO | Admitting: Adult Health

## 2021-01-31 ENCOUNTER — Encounter: Payer: Self-pay | Admitting: Adult Health

## 2021-01-31 VITALS — BP 130/86 | HR 64 | Temp 98.3°F | Ht 69.0 in | Wt 190.0 lb

## 2021-01-31 DIAGNOSIS — S41112D Laceration without foreign body of left upper arm, subsequent encounter: Secondary | ICD-10-CM | POA: Diagnosis not present

## 2021-01-31 DIAGNOSIS — I4891 Unspecified atrial fibrillation: Secondary | ICD-10-CM

## 2021-01-31 MED ORDER — ASPIRIN 325 MG PO CAPS: 325.0000 mg | ORAL_CAPSULE | Freq: Every day | ORAL | 0 refills | Status: DC

## 2021-01-31 NOTE — Progress Notes (Signed)
Pt was seen today.   No further action needed

## 2021-01-31 NOTE — Patient Instructions (Addendum)
It was great seeing you today   Your cardiac monitor showed a fast heart rate and episodes of Atrial Fibrillation   I am going to put you on Aspirin 325 mg daily and refer you to the afib clinic - they will call you to schedule your appointment

## 2021-01-31 NOTE — Progress Notes (Signed)
Subjective:    Patient ID: Jose Evans, male    DOB: 06/21/1945, 75 y.o.   MRN: [id number]  HPI 75 year old male who  has a past medical history of Adult acne, Cancer ([place]) (07/2017), Gout, Hypertension, and Thyroid disease.  He presents to the office today after being seen in the emergency room, 4 days ago on 01/27/2021.  He presented after his family member and himself were carrying a cast iron sink up some stairs.  He slipped and fell down 10 carpeted stairs.  He had pain to his left rib and left arm.  Did not believe he hit his head.  He had no LOC.  Had a contusion to lateral chest wall as well as a large skin tear on the left upper arm  X-ray did not show fracture.  Skin tear was debrided and dressed.  He was discharged with follow-up to PCP  Today he reports that he continues to be sore throughout his body but pain is not severe.  He has been buying antibiotic ointment to his skin tear and wrapping with a nonstick gauze.  Reports no signs of infection.  Additionally, we received the results of his long-term cardiac monitor which showed  6.7% supraventricular ectopy Less than 1% ventricular ectopy 3% atrial fibrillation burden Multiple SVT episodes, longest 25 seconds Triggered episodes associated with both SVT and atrial fibrillation   Review of Systems See HPI   Past Medical History:  Diagnosis Date   Adult acne    Cancer ([place]) 07/2017   skin cancer   Gout    Hypertension    Thyroid disease     Social History   Socioeconomic History   Marital status: Married    Spouse name: Not on file   Number of children: Not on file   Years of education: Not on file   Highest education level: Not on file  Occupational History   Not on file  Tobacco Use   Smoking status: Former    Packs/day: 0.50    Years: 30.00    Pack years: 15.00    Types: Cigarettes    Quit date: 09/07/2005    Years since quitting: 15.4   Smokeless tobacco: Never  Vaping Use   Vaping Use: Never  used  Substance and Sexual Activity   Alcohol use: Yes    Comment: FEW SHOTS A WEEK   Drug use: No   Sexual activity: Not on file  Other Topics Concern   Not on file  Social History Narrative   Works for [employer]    Married   m   Social Determinants of Radio broadcast assistant Strain: Low Risk    Difficulty of Paying Living Expenses: Not hard at all  Food Insecurity: No Food Insecurity   Worried About Charity fundraiser in the Last Year: Never true   Arboriculturist in the Last Year: Never true  Transportation Needs: No Transportation Needs   Lack of Transportation (Medical): No   Lack of Transportation (Non-Medical): No  Physical Activity: Insufficiently Active   Days of Exercise per Week: 3 days   Minutes of Exercise per Session: 40 min  Stress: No Stress Concern Present   Feeling of Stress : Not at all  Social Connections: Moderately Isolated   Frequency of Communication with Friends and Family: Twice a week   Frequency of Social Gatherings with Friends and Family: Twice a week   Attends Religious Services: Never  Active Member of Clubs or Organizations: No   Attends Archivist Meetings: Never   Marital Status: Married  Human resources officer Violence: Not At Risk   Fear of Current or Ex-Partner: No   Emotionally Abused: No   Physically Abused: No   Sexually Abused: No    Past Surgical History:  Procedure Laterality Date   HERNIA REPAIR     SHOULDER SURGERY     TONSILLECTOMY      Family History  Problem Relation Age of Onset   Hypertension Other     No Known Allergies  Current Outpatient Medications on File Prior to Visit  Medication Sig Dispense Refill   allopurinol (ZYLOPRIM) 300 MG tablet TAKE 1 TABLET BY MOUTH EVERY DAY 90 tablet 1   amphetamine-dextroamphetamine (ADDERALL) 10 MG tablet Take 1 tablet (10 mg total) by mouth daily. 30 tablet 0   amphetamine-dextroamphetamine (ADDERALL) 10 MG tablet Take 1 tablet (10 mg total) by  mouth daily. 30 tablet 0   amphetamine-dextroamphetamine (ADDERALL) 10 MG tablet Take 1 tablet (10 mg total) by mouth daily. 30 tablet 0   finasteride (PROSCAR) 5 MG tablet TAKE 1 TABLET BY MOUTH EVERY DAY 90 tablet 1   fluoruracil (CARAC) 0.5 % cream Apply 1 application topically daily as needed.     HYDROcodone-acetaminophen (NORCO/VICODIN) 5-325 MG tablet Take 1 tablet by mouth every 4 (four) hours as needed. 10 tablet 0   levothyroxine (SYNTHROID) 175 MCG tablet TAKE 1 TABLET BY MOUTH EVERY DAY 90 tablet 1   lisinopril (ZESTRIL) 5 MG tablet TAKE 1 TABLET BY MOUTH EVERY DAY 90 tablet 1   meloxicam (MOBIC) 7.5 MG tablet Take 1 tablet by mouth daily as needed.  2   metoprolol tartrate (LOPRESSOR) 25 MG tablet Take 1 tablet (25 mg total) by mouth daily. 90 tablet 1   naproxen sodium (ALEVE) 220 MG tablet Take 220 mg by mouth.     podofilox (CONDYLOX) 0.5 % external solution Apply topically 2 (two) times daily. Twice a day for 3 days and then stop for four days. Can be repeated 4 times 3.5 mL 1   traZODone (DESYREL) 50 MG tablet Take 1 tablet (50 mg total) by mouth at bedtime as needed for sleep. 90 tablet 1   triamcinolone cream (KENALOG) 0.1 % Apply 1 application topically 2 (two) times daily. 30 g 0   triamterene-hydrochlorothiazide (MAXZIDE) 75-50 MG tablet TAKE 1 TABLET BY MOUTH EVERY DAY 90 tablet 0   No current facility-administered medications on file prior to visit.    BP 130/86   Pulse 64   Temp 98.3 F (36.8 C) (Oral)   Ht 5\' 9"  (1.753 m)   Wt 190 lb (86.2 kg)   SpO2 96%   BMI 28.06 kg/m       Objective:   Physical Exam Vitals and nursing note reviewed.  Constitutional:      Appearance: Normal appearance.  Cardiovascular:     Rate and Rhythm: Normal rate and regular rhythm.     Pulses: Normal pulses.     Heart sounds: Normal heart sounds.  Pulmonary:     Effort: Pulmonary effort is normal.     Breath sounds: Normal breath sounds.  Musculoskeletal:        General:  Normal range of motion.  Skin:    General: Skin is warm and dry.     Capillary Refill: Capillary refill takes less than 2 seconds.     Comments: Well-healing skin tear on left upper arm  Neurological:     General: No focal deficit present.     Mental Status: He is alert and oriented to person, place, and time.  Psychiatric:        Mood and Affect: Mood normal.        Behavior: Behavior normal.        Thought Content: Thought content normal.        Judgment: Judgment normal.      Assessment & Plan:  1. Skin tear of left upper arm without complication, subsequent encounter -Continue with current treatment for the next week, at that time he should be able to remove dressings and keep open to air.  Advise follow-up if pains and symptoms of infection present.  Does not want any additional medication for pain relief at this point in time.  2. Atrial fibrillation, unspecified type ([place]) -Have him take a 325 aspirin daily.  Continue taking metoprolol.  Will refer to A. fib clinic - Amb Referral to [place], NP

## 2021-02-19 ENCOUNTER — Telehealth: Payer: Self-pay | Admitting: Pharmacist

## 2021-02-19 NOTE — Chronic Care Management (AMB) (Signed)
Chronic Care Management Pharmacy Assistant   Name: Jose Evans  MRN: [id number] DOB: 06-30-1945  Reason for Encounter: Disease State / Hypertension Assessment Call   Conditions to be addressed/monitored: HTN  Recent office visits:  01/31/2021 [name] NP (PCP) - Patient was seen for a skin teal or left upper arm and an additional issue. Referral to AFIB clinic. Started Aspirin 325 mg daily. Discontinued Metformin. No follow up noted.  01/17/2021 Randel Pigg LPN - Medicare annual wellness visit.  Recent consult visits:  None  Hospital visits:  Medication Reconciliation was completed by discussion with patient.  Patient was seen at [facility] ED on 01/27/2021 due to skin tear of left upper arm. Time spent at ED was 1 hour.  New?Medications Started at [facility] Discharge:?? -started Hydrocodone Acetaminophen 5/325 mg 1 tablet every 4 hours as needed.   Medication Changes at Hospital Discharge: -None  Medications Discontinued at Hospital Discharge: -None ??  -All other medications will remain the same.    Medications: Outpatient Encounter Medications as of 02/19/2021  Medication Sig   allopurinol (ZYLOPRIM) 300 MG tablet TAKE 1 TABLET BY MOUTH EVERY DAY   amphetamine-dextroamphetamine (ADDERALL) 10 MG tablet Take 1 tablet (10 mg total) by mouth daily.   amphetamine-dextroamphetamine (ADDERALL) 10 MG tablet Take 1 tablet (10 mg total) by mouth daily.   amphetamine-dextroamphetamine (ADDERALL) 10 MG tablet Take 1 tablet (10 mg total) by mouth daily.   Aspirin 325 MG CAPS Take 325 mg by mouth daily.   finasteride (PROSCAR) 5 MG tablet TAKE 1 TABLET BY MOUTH EVERY DAY   fluoruracil (CARAC) 0.5 % cream Apply 1 application topically daily as needed.   HYDROcodone-acetaminophen (NORCO/VICODIN) 5-325 MG tablet Take 1 tablet by mouth every 4 (four) hours as needed.   levothyroxine (SYNTHROID) 175 MCG tablet TAKE 1 TABLET BY MOUTH EVERY DAY   lisinopril (ZESTRIL) 5  MG tablet TAKE 1 TABLET BY MOUTH EVERY DAY   meloxicam (MOBIC) 7.5 MG tablet Take 1 tablet by mouth daily as needed.   metoprolol tartrate (LOPRESSOR) 25 MG tablet Take 1 tablet (25 mg total) by mouth daily.   naproxen sodium (ALEVE) 220 MG tablet Take 220 mg by mouth.   podofilox (CONDYLOX) 0.5 % external solution Apply topically 2 (two) times daily. Twice a day for 3 days and then stop for four days. Can be repeated 4 times   traZODone (DESYREL) 50 MG tablet Take 1 tablet (50 mg total) by mouth at bedtime as needed for sleep.   triamcinolone cream (KENALOG) 0.1 % Apply 1 application topically 2 (two) times daily.   triamterene-hydrochlorothiazide (MAXZIDE) 75-50 MG tablet TAKE 1 TABLET BY MOUTH EVERY DAY   No facility-administered encounter medications on file as of 02/19/2021.   Fill History: ALLOPURINOL 300 MG TABLET 12/02/2020 90   DEXTROAMP-AMPHETAMIN 10 MG TAB 02/14/2021 30   FINASTERIDE 5 MG TABLET 12/02/2020 90   01/31/2021 100   LEVOTHYROXINE 175 MCG TABLET 01/16/2021 90   METOPROLOL TARTRATE 25 MG TAB 11/22/2020 90   TRIAMTERENE-HCTZ 75-50 MG TAB 12/10/2020 90   TRAZODONE 50 MG TABLET 01/24/2021 90   Reviewed chart prior to disease state call. Spoke with patient regarding BP  Recent Office Vitals: BP Readings from Last 3 Encounters:  01/31/21 130/86  01/27/21 (!) 153/91  01/17/21 138/78   Pulse Readings from Last 3 Encounters:  01/31/21 64  01/27/21 (!) 56  01/17/21 72    Wt Readings from Last 3 Encounters:  01/31/21 190 lb (86.2 kg)  01/27/21 181 lb (82.1 kg)  01/17/21 192 lb (87.1 kg)     Kidney Function Lab Results  Component Value Date/Time   CREATININE 1.14 01/26/2020 01:38 PM   CREATININE 1.00 01/25/2019 09:36 AM   CREATININE 0.92 12/30/2017 12:21 PM   GFR 73.60 01/25/2019 09:36 AM   GFRNONAA 64 01/26/2020 01:38 PM   GFRAA 74 01/26/2020 01:38 PM    BMP Latest Ref Rng & Units 01/26/2020 01/25/2019 12/30/2017  Glucose 65 - 99 mg/dL 122(H) 124(H)  115(H)  BUN 7 - 25 mg/dL 29(H) 24(H) 26(H)  Creatinine 0.70 - 1.18 mg/dL 1.14 1.00 0.92  BUN/Creat Ratio 6 - 22 (calc) 25(H) - -  Sodium 135 - 146 mmol/L 140 138 140  Potassium 3.5 - 5.3 mmol/L 4.3 4.1 3.7  Chloride 98 - 110 mmol/L 102 100 103  CO2 20 - 32 mmol/L 23 29 28   Calcium 8.6 - 10.3 mg/dL 10.1 10.0 9.9    Current antihypertensive regimen:  Lisinopril 5 mg daily? Last filled in 2021 Metoprolol 25 mg daily Maxzide 75/50 mg daily  How often are you checking your Blood Pressure?   Current home BP readings:   What recent interventions/DTPs have been made by any provider to improve Blood Pressure control since last CPP Visit:   Any recent hospitalizations or ED visits since last visit with CPP? Yes  What diet changes have been made to improve Blood Pressure Control?    What exercise is being done to improve your Blood Pressure Control?   Adherence Review: Is the patient currently on ACE/ARB medication?   Does the patient have >5 day gap between last estimated fill dates? No  Care Gaps: AWV - completed 01/17/2021 Last BP - 130/86 on 01/31/2021 Last A1C - 6.4 on 01/26/2020 Hep C Screen - never done Shingrix - never done Colonoscopy - never done Covid vaccine - overdue Flu - due  Star Rating Drugs: None  [place] Pharmacist Assistant [phone]

## 2021-02-27 ENCOUNTER — Other Ambulatory Visit: Payer: Self-pay | Admitting: Adult Health

## 2021-02-27 ENCOUNTER — Other Ambulatory Visit: Payer: Self-pay

## 2021-02-27 ENCOUNTER — Encounter: Payer: Self-pay | Admitting: Adult Health

## 2021-02-27 ENCOUNTER — Ambulatory Visit (INDEPENDENT_AMBULATORY_CARE_PROVIDER_SITE_OTHER): Payer: Medicare HMO | Admitting: Adult Health

## 2021-02-27 VITALS — BP 140/90 | HR 52 | Temp 98.2°F | Ht 69.0 in | Wt 189.0 lb

## 2021-02-27 DIAGNOSIS — M1A20X0 Drug-induced chronic gout, unspecified site, without tophus (tophi): Secondary | ICD-10-CM

## 2021-02-27 DIAGNOSIS — Z1211 Encounter for screening for malignant neoplasm of colon: Secondary | ICD-10-CM

## 2021-02-27 DIAGNOSIS — I10 Essential (primary) hypertension: Secondary | ICD-10-CM

## 2021-02-27 DIAGNOSIS — R69 Illness, unspecified: Secondary | ICD-10-CM | POA: Diagnosis not present

## 2021-02-27 DIAGNOSIS — F988 Other specified behavioral and emotional disorders with onset usually occurring in childhood and adolescence: Secondary | ICD-10-CM

## 2021-02-27 DIAGNOSIS — Z1159 Encounter for screening for other viral diseases: Secondary | ICD-10-CM

## 2021-02-27 DIAGNOSIS — I4891 Unspecified atrial fibrillation: Secondary | ICD-10-CM | POA: Diagnosis not present

## 2021-02-27 DIAGNOSIS — Z0000 Encounter for general adult medical examination without abnormal findings: Secondary | ICD-10-CM

## 2021-02-27 DIAGNOSIS — E039 Hypothyroidism, unspecified: Secondary | ICD-10-CM | POA: Diagnosis not present

## 2021-02-27 DIAGNOSIS — N401 Enlarged prostate with lower urinary tract symptoms: Secondary | ICD-10-CM

## 2021-02-27 DIAGNOSIS — R351 Nocturia: Secondary | ICD-10-CM

## 2021-02-27 DIAGNOSIS — G479 Sleep disorder, unspecified: Secondary | ICD-10-CM

## 2021-02-27 MED ORDER — ALLOPURINOL 300 MG PO TABS: 300.0000 mg | ORAL_TABLET | Freq: Every day | ORAL | 3 refills | Status: DC

## 2021-02-27 MED ORDER — METOPROLOL TARTRATE 25 MG PO TABS
25.0000 mg | ORAL_TABLET | Freq: Every day | ORAL | 3 refills | Status: DC
Start: 2021-02-27 — End: 2021-04-16

## 2021-02-27 MED ORDER — TRIAMTERENE-HCTZ 75-50 MG PO TABS: 1.0000 | ORAL_TABLET | Freq: Every day | ORAL | 3 refills | Status: DC

## 2021-02-27 MED ORDER — LISINOPRIL 5 MG PO TABS: 5.0000 mg | ORAL_TABLET | Freq: Every day | ORAL | 3 refills | Status: DC

## 2021-02-27 MED ORDER — FINASTERIDE 5 MG PO TABS: 5.0000 mg | ORAL_TABLET | Freq: Every day | ORAL | 3 refills | Status: DC

## 2021-02-27 NOTE — Progress Notes (Signed)
Subjective:    Patient ID: Jose Evans, male    DOB: 06-Apr-1946, 75 y.o.   MRN: [id number]  HPI Patient presents for yearly preventative medicine examination. He is a pleasant 75 year old male who  has a past medical history of Adult acne, Cancer ([place]) (07/2017), Gout, Hypertension, and Thyroid disease.  Hypothyroidism-takes Synthroid 150 mcg every morning.  Feels controlled on this medication Lab Results  Component Value Date   TSH 1.01 03/01/2020   Hypertension-managed with lisinopril 5 mg, Lopressor 25 mg daily, and Maxide 75-25 mg.  Denies chest pain, shortness of breath, or headaches  BP Readings from Last 3 Encounters:  02/27/21 140/90  01/31/21 130/86  01/27/21 (!) 153/91   A fib -had palpitations intermittently as far back as 2012.  Symptoms last for about 2 hours and happen about once a week.  During these episodes he noticed intense tinnitus, tachycardia towards 130s at which time he will have some painful chest discomfort.  Cardiac monitor showed  6.7% supraventricular ectopy Less than 1% ventricular ectopy 3% atrial fibrillation burden Multiple SVT episodes, longest 25 seconds Triggered episodes associated with both SVT and atrial fibrillation  He was subsequently referred over to cardiology but has not had an appointment yet.  He is taking a full 825 mg aspirin.  Gout-takes allopurinol 300 mg daily.  He denies any recent gout flares  ADHD-controlled with Adderall 10 mg daily  BPH-well controlled with Proscar 5 mg daily.  He denies symptoms  Insomnia-well-controlled with trazodone 50 mg daily.   All immunizations and health maintenance protocols were reviewed with the patient and needed orders were placed.  Appropriate screening laboratory values were ordered for the patient including screening of hyperlipidemia, renal function and hepatic function. If indicated by BPH, a PSA was ordered.  Medication reconciliation,  past medical history, social history,  problem list and allergies were reviewed in detail with the patient  Goals were established with regard to weight loss, exercise, and  diet in compliance with medications  In the past he has refused Cologuard and having a colonoscopy done.  Last year we sent in Cologuard for him as he was agreeable, this was never done. Will send in again this year   Has no acute issues   Review of Systems  Constitutional: Negative.   HENT: Negative.    Eyes: Negative.   Respiratory: Negative.    Cardiovascular: Negative.   Gastrointestinal: Negative.   Endocrine: Negative.   Genitourinary: Negative.   Musculoskeletal: Negative.   Skin: Negative.   Allergic/Immunologic: Negative.   Neurological: Negative.   Hematological: Negative.   Psychiatric/Behavioral: Negative.    All other systems reviewed and are negative.  Past Medical History:  Diagnosis Date   Adult acne    Cancer ([place]) 07/2017   skin cancer   Gout    Hypertension    Thyroid disease     Social History   Socioeconomic History   Marital status: Married    Spouse name: Not on file   Number of children: Not on file   Years of education: Not on file   Highest education level: Not on file  Occupational History   Not on file  Tobacco Use   Smoking status: Former    Packs/day: 0.50    Years: 30.00    Pack years: 15.00    Types: Cigarettes    Quit date: 09/07/2005    Years since quitting: 15.4   Smokeless tobacco: Never  Vaping Use   Vaping  Use: Never used  Substance and Sexual Activity   Alcohol use: Yes    Comment: FEW SHOTS A WEEK   Drug use: No   Sexual activity: Not on file  Other Topics Concern   Not on file  Social History Narrative   Works for [employer]    Married   m   Social Determinants of Radio broadcast assistant Strain: Low Risk    Difficulty of Paying Living Expenses: Not hard at all  Food Insecurity: No Food Insecurity   Worried About Charity fundraiser in the Last Year: Never  true   Arboriculturist in the Last Year: Never true  Transportation Needs: No Transportation Needs   Lack of Transportation (Medical): No   Lack of Transportation (Non-Medical): No  Physical Activity: Insufficiently Active   Days of Exercise per Week: 3 days   Minutes of Exercise per Session: 40 min  Stress: No Stress Concern Present   Feeling of Stress : Not at all  Social Connections: Moderately Isolated   Frequency of Communication with Friends and Family: Twice a week   Frequency of Social Gatherings with Friends and Family: Twice a week   Attends Religious Services: Never   Marine scientist or Organizations: No   Attends Music therapist: Never   Marital Status: Married  Human resources officer Violence: Not At Risk   Fear of Current or Ex-Partner: No   Emotionally Abused: No   Physically Abused: No   Sexually Abused: No    Past Surgical History:  Procedure Laterality Date   HERNIA REPAIR     SHOULDER SURGERY     TONSILLECTOMY      Family History  Problem Relation Age of Onset   Hypertension Other     No Known Allergies  Current Outpatient Medications on File Prior to Visit  Medication Sig Dispense Refill   allopurinol (ZYLOPRIM) 300 MG tablet TAKE 1 TABLET BY MOUTH EVERY DAY 90 tablet 1   amphetamine-dextroamphetamine (ADDERALL) 10 MG tablet Take 1 tablet (10 mg total) by mouth daily. 30 tablet 0   amphetamine-dextroamphetamine (ADDERALL) 10 MG tablet Take 1 tablet (10 mg total) by mouth daily. 30 tablet 0   amphetamine-dextroamphetamine (ADDERALL) 10 MG tablet Take 1 tablet (10 mg total) by mouth daily. 30 tablet 0   Aspirin 325 MG CAPS Take 325 mg by mouth daily. 90 capsule 0   finasteride (PROSCAR) 5 MG tablet TAKE 1 TABLET BY MOUTH EVERY DAY 90 tablet 1   fluoruracil (CARAC) 0.5 % cream Apply 1 application topically daily as needed.     HYDROcodone-acetaminophen (NORCO/VICODIN) 5-325 MG tablet Take 1 tablet by mouth every 4 (four) hours as needed.  10 tablet 0   levothyroxine (SYNTHROID) 175 MCG tablet TAKE 1 TABLET BY MOUTH EVERY DAY 90 tablet 1   lisinopril (ZESTRIL) 5 MG tablet TAKE 1 TABLET BY MOUTH EVERY DAY 90 tablet 1   meloxicam (MOBIC) 7.5 MG tablet Take 1 tablet by mouth daily as needed.  2   metoprolol tartrate (LOPRESSOR) 25 MG tablet Take 1 tablet (25 mg total) by mouth daily. 90 tablet 1   naproxen sodium (ALEVE) 220 MG tablet Take 220 mg by mouth.     podofilox (CONDYLOX) 0.5 % external solution Apply topically 2 (two) times daily. Twice a day for 3 days and then stop for four days. Can be repeated 4 times 3.5 mL 1   traZODone (DESYREL) 50 MG tablet Take 1  tablet (50 mg total) by mouth at bedtime as needed for sleep. 90 tablet 1   triamcinolone cream (KENALOG) 0.1 % Apply 1 application topically 2 (two) times daily. 30 g 0   triamterene-hydrochlorothiazide (MAXZIDE) 75-50 MG tablet TAKE 1 TABLET BY MOUTH EVERY DAY 90 tablet 0   No current facility-administered medications on file prior to visit.    BP 140/90   Pulse (!) 52   Temp 98.2 F (36.8 C) (Oral)   Ht 5\' 9"  (1.753 m)   Wt 189 lb (85.7 kg)   SpO2 96%   BMI 27.91 kg/m        Objective:   Physical Exam Vitals and nursing note reviewed.  Constitutional:      General: He is not in acute distress.    Appearance: Normal appearance. He is well-developed and normal weight.  HENT:     Head: Normocephalic and atraumatic.     Right Ear: Tympanic membrane, ear canal and external ear normal. There is no impacted cerumen.     Left Ear: Tympanic membrane, ear canal and external ear normal. There is no impacted cerumen.     Nose: Nose normal. No congestion or rhinorrhea.     Mouth/Throat:     Mouth: Mucous membranes are moist.     Pharynx: Oropharynx is clear. No oropharyngeal exudate or posterior oropharyngeal erythema.  Eyes:     General:        Right eye: No discharge.        Left eye: No discharge.     Extraocular Movements: Extraocular movements intact.      Conjunctiva/sclera: Conjunctivae normal.     Pupils: Pupils are equal, round, and reactive to light.  Neck:     Vascular: No carotid bruit.     Trachea: No tracheal deviation.  Cardiovascular:     Rate and Rhythm: Normal rate and regular rhythm.     Pulses: Normal pulses.     Heart sounds: Normal heart sounds. No murmur heard.   No friction rub. No gallop.  Pulmonary:     Effort: Pulmonary effort is normal. No respiratory distress.     Breath sounds: Normal breath sounds. No stridor. No wheezing, rhonchi or rales.  Chest:     Chest wall: No tenderness.  Abdominal:     General: Bowel sounds are normal. There is no distension.     Palpations: Abdomen is soft. There is no mass.     Tenderness: There is no abdominal tenderness. There is no right CVA tenderness, left CVA tenderness, guarding or rebound.     Hernia: No hernia is present.  Musculoskeletal:        General: No swelling, tenderness, deformity or signs of injury. Normal range of motion.     Right lower leg: No edema.     Left lower leg: No edema.  Lymphadenopathy:     Cervical: No cervical adenopathy.  Skin:    General: Skin is warm and dry.     Capillary Refill: Capillary refill takes less than 2 seconds.     Coloration: Skin is not jaundiced or pale.     Findings: No bruising, erythema, lesion or rash.  Neurological:     General: No focal deficit present.     Mental Status: He is alert and oriented to person, place, and time.     Cranial Nerves: No cranial nerve deficit.     Sensory: No sensory deficit.     Motor: No weakness.  Coordination: Coordination normal.     Gait: Gait normal.     Deep Tendon Reflexes: Reflexes normal.  Psychiatric:        Mood and Affect: Mood normal.        Behavior: Behavior normal.        Thought Content: Thought content normal.        Judgment: Judgment normal.      Assessment & Plan:  1. Routine general medical examination at a health care facility - Encouraged lifestyle  modifications  - Follow up in one year or sooner if needed - CBC with Differential/Platelet; Future - Comprehensive metabolic panel; Future - Lipid panel; Future - TSH; Future  2. Atrial fibrillation, unspecified type [facility]) - Phone number given for Cardiology. He will call and schedule - CBC with Differential/Platelet; Future - Comprehensive metabolic panel; Future - Lipid panel; Future - TSH; Future  3. Attention deficit disorder (ADD) without hyperactivity - Continue Adderall  - CBC with Differential/Platelet; Future - Comprehensive metabolic panel; Future - Lipid panel; Future - TSH; Future  4. Essential hypertension - Controlled. No change in medications  - CBC with Differential/Platelet; Future - Comprehensive metabolic panel; Future - Lipid panel; Future - TSH; Future - lisinopril (ZESTRIL) 5 MG tablet; Take 1 tablet (5 mg total) by mouth daily.  Dispense: 90 tablet; Refill: 3 - metoprolol tartrate (LOPRESSOR) 25 MG tablet; Take 1 tablet (25 mg total) by mouth daily.  Dispense: 90 tablet; Refill: 3 - triamterene-hydrochlorothiazide (MAXZIDE) 75-50 MG tablet; Take 1 tablet by mouth daily.  Dispense: 90 tablet; Refill: 3  5. Hypothyroidism, unspecified type - Consider increase in synthroid  - CBC with Differential/Platelet; Future - Comprehensive metabolic panel; Future - Lipid panel; Future - TSH; Future  6. Sleep disturbance - Continue Trazodone  - CBC with Differential/Platelet; Future - Comprehensive metabolic panel; Future - Lipid panel; Future - TSH; Future  7. BPH associated with nocturia  - PSA; Future - finasteride (PROSCAR) 5 MG tablet; Take 1 tablet (5 mg total) by mouth daily.  Dispense: 90 tablet; Refill: 3  8. Colon cancer screening  - Cologuard  9. 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  Dispense: 90 tablet; Refill: [place]

## 2021-02-27 NOTE — Patient Instructions (Signed)
It was great seeing you today   We will follow up with you regarding your labs   Please call Cardiology and get scheduled   [place], [address] [facility] Address: [address], [place], [place] [address] Phone: [phone]

## 2021-02-28 ENCOUNTER — Other Ambulatory Visit: Payer: Self-pay | Admitting: Adult Health

## 2021-02-28 ENCOUNTER — Other Ambulatory Visit (INDEPENDENT_AMBULATORY_CARE_PROVIDER_SITE_OTHER): Payer: Medicare HMO

## 2021-02-28 DIAGNOSIS — I4891 Unspecified atrial fibrillation: Secondary | ICD-10-CM

## 2021-02-28 DIAGNOSIS — Z1159 Encounter for screening for other viral diseases: Secondary | ICD-10-CM

## 2021-02-28 DIAGNOSIS — N401 Enlarged prostate with lower urinary tract symptoms: Secondary | ICD-10-CM | POA: Diagnosis not present

## 2021-02-28 DIAGNOSIS — F988 Other specified behavioral and emotional disorders with onset usually occurring in childhood and adolescence: Secondary | ICD-10-CM | POA: Diagnosis not present

## 2021-02-28 DIAGNOSIS — E039 Hypothyroidism, unspecified: Secondary | ICD-10-CM | POA: Diagnosis not present

## 2021-02-28 DIAGNOSIS — R7309 Other abnormal glucose: Secondary | ICD-10-CM | POA: Diagnosis not present

## 2021-02-28 DIAGNOSIS — Z0000 Encounter for general adult medical examination without abnormal findings: Secondary | ICD-10-CM

## 2021-02-28 DIAGNOSIS — R351 Nocturia: Secondary | ICD-10-CM

## 2021-02-28 DIAGNOSIS — I10 Essential (primary) hypertension: Secondary | ICD-10-CM

## 2021-02-28 DIAGNOSIS — G479 Sleep disorder, unspecified: Secondary | ICD-10-CM | POA: Diagnosis not present

## 2021-02-28 DIAGNOSIS — R69 Illness, unspecified: Secondary | ICD-10-CM | POA: Diagnosis not present

## 2021-02-28 LAB — COMPREHENSIVE METABOLIC PANEL
ALT: 29 U/L (ref 0–53)
AST: 21 U/L (ref 0–37)
Albumin: 4.7 g/dL (ref 3.5–5.2)
Alkaline Phosphatase: 65 U/L (ref 39–117)
BUN: 25 mg/dL — ABNORMAL HIGH (ref 6–23)
CO2: 29 mEq/L (ref 19–32)
Calcium: 9.8 mg/dL (ref 8.4–10.5)
Chloride: 100 mEq/L (ref 96–112)
Creatinine, Ser: 1.04 mg/dL (ref 0.40–1.50)
GFR: 71.3 mL/min (ref >60.00)
Glucose, Bld: 133 mg/dL — ABNORMAL HIGH (ref 70–99)
Potassium: 4 mEq/L (ref 3.5–5.1)
Sodium: 138 mEq/L (ref 135–145)
Total Bilirubin: 0.8 mg/dL (ref 0.2–1.2)
Total Protein: 6.7 g/dL (ref 6.0–8.3)

## 2021-02-28 LAB — PSA: PSA: 1.89 ng/mL (ref 0.10–4.00)

## 2021-02-28 LAB — CBC WITH DIFFERENTIAL/PLATELET
Basophils Absolute: 0.1 10*3/uL (ref 0.0–0.1)
Basophils Relative: 1.3 % (ref 0.0–3.0)
Eosinophils Absolute: 0.1 10*3/uL (ref 0.0–0.7)
Eosinophils Relative: 2.2 % (ref 0.0–5.0)
HCT: 43.1 % (ref 39.0–52.0)
Hemoglobin: 15.1 g/dL (ref 13.0–17.0)
Lymphocytes Relative: 22 % (ref 12.0–46.0)
Lymphs Abs: 1.4 10*3/uL (ref 0.7–4.0)
MCHC: 35 g/dL (ref 30.0–36.0)
MCV: 90.5 fl (ref 78.0–100.0)
Monocytes Absolute: 0.5 10*3/uL (ref 0.1–1.0)
Monocytes Relative: 7.6 % (ref 3.0–12.0)
Neutro Abs: 4.2 10*3/uL (ref 1.4–7.7)
Neutrophils Relative %: 66.9 % (ref 43.0–77.0)
Platelets: 218 10*3/uL (ref 150.0–400.0)
RBC: 4.76 Mil/uL (ref 4.22–5.81)
RDW: 13.8 % (ref 11.5–15.5)
WBC: 6.2 10*3/uL (ref 4.0–10.5)

## 2021-02-28 LAB — LIPID PANEL
Cholesterol: 155 mg/dL (ref 0–200)
HDL: 39.2 mg/dL (ref >39.00)
LDL Cholesterol: 88 mg/dL (ref 0–99)
NonHDL: 115.56
Total CHOL/HDL Ratio: 4
Triglycerides: 138 mg/dL (ref 0.0–149.0)
VLDL: 27.6 mg/dL (ref 0.0–40.0)

## 2021-02-28 LAB — TSH: TSH: 0.43 u[IU]/mL (ref 0.35–5.50)

## 2021-02-28 LAB — HEMOGLOBIN A1C: Hgb A1c MFr Bld: 6.5 % (ref 4.6–6.5)

## 2021-02-28 NOTE — Addendum Note (Signed)
Addended by: Rosalyn Gess D on: 02/28/2021 08:33 AM   Modules accepted: Orders

## 2021-03-01 ENCOUNTER — Telehealth: Payer: Self-pay | Admitting: Adult Health

## 2021-03-01 DIAGNOSIS — R7303 Prediabetes: Secondary | ICD-10-CM

## 2021-03-01 LAB — HEPATITIS C ANTIBODY
Hepatitis C Ab: NONREACTIVE
SIGNAL TO CUT-OFF: 0.02 (ref <1.00)

## 2021-03-01 MED ORDER — METFORMIN HCL 500 MG PO TABS: 500.0000 mg | ORAL_TABLET | Freq: Two times a day (BID) | ORAL | 0 refills | Status: DC

## 2021-03-01 NOTE — Telephone Encounter (Signed)
Spoke to patient and informed him of his labs.  All of his labs are normal except for his A1c which was 6.5.  He does report not taking his metformin that he was prescribed about a year ago for glucose intolerance.  We will send in a new prescription of metformin 500 mg twice daily, he was advised to start exercising and cut back on sugars and carbs.  We will follow-up in 3 months.

## 2021-03-21 ENCOUNTER — Telehealth: Payer: Self-pay | Admitting: Pharmacist

## 2021-03-21 NOTE — Chronic Care Management (AMB) (Signed)
Chronic Care Management Pharmacy Assistant   Name: Jose Evans  MRN: [id number] DOB: 07/02/1945  Reason for Encounter: Disease State  Hypertension Assessment Call   Conditions to be addressed/monitored: HTN  Recent office visits:  02/27/2021 [name] NP - Patient was seen for routine general medical exam and additional issues. No medication changes. No follow up noted.  Recent consult visits:  None  Hospital visits:  None  Medications: Outpatient Encounter Medications as of 03/21/2021  Medication Sig   allopurinol (ZYLOPRIM) 300 MG tablet Take 1 tablet (300 mg total) by mouth daily.   amphetamine-dextroamphetamine (ADDERALL) 10 MG tablet Take 1 tablet (10 mg total) by mouth daily.   amphetamine-dextroamphetamine (ADDERALL) 10 MG tablet Take 1 tablet (10 mg total) by mouth daily.   amphetamine-dextroamphetamine (ADDERALL) 10 MG tablet Take 1 tablet (10 mg total) by mouth daily.   Aspirin 325 MG CAPS Take 325 mg by mouth daily.   finasteride (PROSCAR) 5 MG tablet Take 1 tablet (5 mg total) by mouth daily.   fluoruracil (CARAC) 0.5 % cream Apply 1 application topically daily as needed.   HYDROcodone-acetaminophen (NORCO/VICODIN) 5-325 MG tablet Take 1 tablet by mouth every 4 (four) hours as needed.   levothyroxine (SYNTHROID) 175 MCG tablet TAKE 1 TABLET BY MOUTH EVERY DAY   lisinopril (ZESTRIL) 5 MG tablet Take 1 tablet (5 mg total) by mouth daily.   meloxicam (MOBIC) 7.5 MG tablet Take 1 tablet by mouth daily as needed.   metFORMIN (GLUCOPHAGE) 500 MG tablet Take 1 tablet (500 mg total) by mouth 2 (two) times daily with a meal.   metoprolol tartrate (LOPRESSOR) 25 MG tablet Take 1 tablet (25 mg total) by mouth daily.   naproxen sodium (ALEVE) 220 MG tablet Take 220 mg by mouth.   podofilox (CONDYLOX) 0.5 % external solution Apply topically 2 (two) times daily. Twice a day for 3 days and then stop for four days. Can be repeated 4 times   traZODone (DESYREL) 50 MG tablet  Take 1 tablet (50 mg total) by mouth at bedtime as needed for sleep.   triamcinolone cream (KENALOG) 0.1 % Apply 1 application topically 2 (two) times daily.   triamterene-hydrochlorothiazide (MAXZIDE) 75-50 MG tablet Take 1 tablet by mouth daily.   No facility-administered encounter medications on file as of 03/21/2021.  Fill History: ALLOPURINOL 300 MG TABLET 02/27/2021 90    DEXTROAMP-AMPHETAMIN 10 MG TAB 02/14/2021 30    FINASTERIDE 5 MG TABLET 12/02/2020 90   01/31/2021 100    LEVOTHYROXINE 175 MCG TABLET 01/16/2021 90    METOPROLOL TARTRATE 25 MG TAB 02/20/2021 90    TRIAMTERENE-HCTZ 75-50 MG TAB 011/12/2020 90    TRAZODONE 50 MG TABLET 01/24/2021 90   LISINOPRIL 5 MG TABLET 02/27/2021 90   Reviewed chart prior to disease state call. Spoke with patient regarding BP  Recent Office Vitals: BP Readings from Last 3 Encounters:  02/27/21 140/90  01/31/21 130/86  01/27/21 (!) 153/91   Pulse Readings from Last 3 Encounters:  02/27/21 (!) 52  01/31/21 64  01/27/21 (!) 56    Wt Readings from Last 3 Encounters:  02/27/21 189 lb (85.7 kg)  01/31/21 190 lb (86.2 kg)  01/27/21 181 lb (82.1 kg)     Kidney Function Lab Results  Component Value Date/Time   CREATININE 1.04 02/28/2021 08:33 AM   CREATININE 1.14 01/26/2020 01:38 PM   CREATININE 1.00 01/25/2019 09:36 AM   GFR 71.30 02/28/2021 08:33 AM   GFRNONAA 64 01/26/2020 01:38 PM  GFRAA 74 01/26/2020 01:38 PM    BMP Latest Ref Rng & Units 02/28/2021 01/26/2020 01/25/2019  Glucose 70 - 99 mg/dL 133(H) 122(H) 124(H)  BUN 6 - 23 mg/dL 25(H) 29(H) 24(H)  Creatinine 0.40 - 1.50 mg/dL 1.04 1.14 1.00  BUN/Creat Ratio 6 - 22 (calc) - 25(H) -  Sodium 135 - 145 mEq/L 138 140 138  Potassium 3.5 - 5.1 mEq/L 4.0 4.3 4.1  Chloride 96 - 112 mEq/L 100 102 100  CO2 19 - 32 mEq/L 29 23 29   Calcium 8.4 - 10.5 mg/dL 9.8 10.1 10.0   Current antihypertensive regimen:  Metoprolol 25 mg daily Maxzide 75/50 mg daily Lisinopril 5 mg  daily  How often are you checking your Blood Pressure?   Current home BP readings:   What recent interventions/DTPs have been made by any provider to improve Blood Pressure control since last CPP Visit: None  Any recent hospitalizations or ED visits since last visit with CPP? No  What diet changes have been made to improve Blood Pressure Control?    What exercise is being done to improve your Blood Pressure Control?    Adherence Review: Is the patient currently on ACE/ARB medication? Yes Does the patient have >5 day gap between last estimated fill dates? No  Unable to reach patient after several attempts.  Care Gaps: AWV - completed 01/17/2021 Last BP - 130/86 on 01/31/2021 Last A1C - 6.4 on 01/26/2020 Hep C Screen - never done Shingrix - never done Colonoscopy - never done Covid vaccine - overdue Flu - due  Star Rating Drugs: Lisinopril 5 mg - last filled 02/27/2021 90 DS at [facility] Pharmacist Assistant [phone]

## 2021-03-26 DIAGNOSIS — Z1211 Encounter for screening for malignant neoplasm of colon: Secondary | ICD-10-CM | POA: Diagnosis not present

## 2021-03-30 LAB — COLOGUARD: COLOGUARD: POSITIVE — AB

## 2021-04-02 ENCOUNTER — Encounter: Payer: Self-pay | Admitting: Adult Health

## 2021-04-02 ENCOUNTER — Telehealth: Payer: Self-pay | Admitting: Adult Health

## 2021-04-02 DIAGNOSIS — R195 Other fecal abnormalities: Secondary | ICD-10-CM

## 2021-04-02 NOTE — Telephone Encounter (Signed)
Updated patient on his cologuard which was positive. Will refer him to GI

## 2021-04-15 ENCOUNTER — Other Ambulatory Visit: Payer: Self-pay | Admitting: Adult Health

## 2021-04-15 ENCOUNTER — Encounter: Payer: Self-pay | Admitting: Adult Health

## 2021-04-15 DIAGNOSIS — I10 Essential (primary) hypertension: Secondary | ICD-10-CM

## 2021-04-16 ENCOUNTER — Ambulatory Visit (HOSPITAL_COMMUNITY)
Admission: RE | Admit: 2021-04-16 | Discharge: 2021-04-16 | Disposition: A | Discharge status: Home / Self Care | Payer: Medicare HMO | Source: Ambulatory Visit | Attending: Nurse Practitioner | Admitting: Nurse Practitioner

## 2021-04-16 ENCOUNTER — Other Ambulatory Visit: Payer: Self-pay | Admitting: Adult Health

## 2021-04-16 ENCOUNTER — Encounter: Payer: Self-pay | Admitting: Gastroenterology

## 2021-04-16 ENCOUNTER — Other Ambulatory Visit: Payer: Self-pay

## 2021-04-16 VITALS — BP 144/76 | HR 109 | Ht 69.0 in | Wt 184.0 lb

## 2021-04-16 DIAGNOSIS — I471 Supraventricular tachycardia: Secondary | ICD-10-CM | POA: Insufficient documentation

## 2021-04-16 DIAGNOSIS — I480 Paroxysmal atrial fibrillation: Secondary | ICD-10-CM | POA: Insufficient documentation

## 2021-04-16 DIAGNOSIS — Z7982 Long term (current) use of aspirin: Secondary | ICD-10-CM | POA: Insufficient documentation

## 2021-04-16 DIAGNOSIS — F988 Other specified behavioral and emotional disorders with onset usually occurring in childhood and adolescence: Secondary | ICD-10-CM

## 2021-04-16 DIAGNOSIS — Z79899 Other long term (current) drug therapy: Secondary | ICD-10-CM | POA: Diagnosis not present

## 2021-04-16 DIAGNOSIS — I10 Essential (primary) hypertension: Secondary | ICD-10-CM | POA: Diagnosis not present

## 2021-04-16 MED ORDER — RIVAROXABAN 20 MG PO TABS: 20.0000 mg | ORAL_TABLET | Freq: Every day | ORAL | 3 refills | Status: DC

## 2021-04-16 MED ORDER — METOPROLOL SUCCINATE ER 25 MG PO TB24: 25.0000 mg | ORAL_TABLET | Freq: Every day | ORAL | 3 refills | Status: DC

## 2021-04-16 MED ORDER — AMPHETAMINE-DEXTROAMPHETAMINE 10 MG PO TABS: 10.0000 mg | ORAL_TABLET | Freq: Every day | ORAL | 0 refills | Status: DC

## 2021-04-16 MED ORDER — DOXYCYCLINE HYCLATE 100 MG PO CAPS: 100.0000 mg | ORAL_CAPSULE | Freq: Two times a day (BID) | ORAL | 3 refills | Status: DC

## 2021-04-16 NOTE — Patient Instructions (Signed)
Stop metoprolol tartrate   Start metoprolol succinate 25mg  once a day  Hold off on starting Xarelto 20mg  once a day until we call you (after speaking with Tommi Rumps) - we will stop aspirin once xarelto is started.

## 2021-04-16 NOTE — Progress Notes (Signed)
Primary Care Physician: [name], NP Referring Physician: same   Jose Evans is a 75 y.o. male with a h/o of paroxysmal afib in the afib clinic for evaluation. He wore a monitor in October that showed 3% afib burden as well SVT. He was placed on ASA and referred to afib clinic.  His EKG today shows atrial flutter with variable block at 109 bpm. He is unaware. He has had episodes for years of having heart pain associated with racing, bilateral arm pain, pain at base of head and then a warm flush usually signals that it is breaking. At one time, he was having 5-6 episodes  a month. Now 1-2 a month. He takes prn metoprolol tartrate with episodes. He has a CHA2DS2VASc score of 2. No tobacco, caffeine or alcohol. Denies snoring.   Pt has had a recent positive cologuard and is pending colonoscopy 05/27/21. He has not noted any blood in stool.   Today, he denies symptoms of palpitations, chest pain, shortness of breath, orthopnea, PND, lower extremity edema, dizziness, presyncope, syncope, or neurologic sequela. The patient is tolerating medications without difficulties and is otherwise without complaint today.   Past Medical History:  Diagnosis Date   Adult acne    Cancer ([place]) 07/2017   skin cancer   Gout    Hypertension    Thyroid disease    Past Surgical History:  Procedure Laterality Date   HERNIA REPAIR     SHOULDER SURGERY     TONSILLECTOMY      Current Outpatient Medications  Medication Sig Dispense Refill   allopurinol (ZYLOPRIM) 300 MG tablet Take 1 tablet (300 mg total) by mouth daily. 90 tablet 3   amphetamine-dextroamphetamine (ADDERALL) 10 MG tablet Take 1 tablet (10 mg total) by mouth daily. 30 tablet 0   amphetamine-dextroamphetamine (ADDERALL) 10 MG tablet Take 1 tablet (10 mg total) by mouth daily. 30 tablet 0   amphetamine-dextroamphetamine (ADDERALL) 10 MG tablet Take 1 tablet (10 mg total) by mouth daily. 30 tablet 0   Aspirin 325 MG CAPS Take 325 mg by mouth  daily. 90 capsule 0   doxycycline (VIBRAMYCIN) 100 MG capsule Take 1 capsule (100 mg total) by mouth 2 (two) times daily. 60 capsule 3   finasteride (PROSCAR) 5 MG tablet Take 1 tablet (5 mg total) by mouth daily. 90 tablet 3   fluoruracil (CARAC) 0.5 % cream Apply 1 application topically daily as needed.     HYDROcodone-acetaminophen (NORCO/VICODIN) 5-325 MG tablet Take 1 tablet by mouth every 4 (four) hours as needed. 10 tablet 0   levothyroxine (SYNTHROID) 175 MCG tablet TAKE 1 TABLET BY MOUTH EVERY DAY 90 tablet 1   lisinopril (ZESTRIL) 5 MG tablet Take 1 tablet (5 mg total) by mouth daily. 90 tablet 3   meloxicam (MOBIC) 7.5 MG tablet Take 1 tablet by mouth daily as needed.  2   metFORMIN (GLUCOPHAGE) 500 MG tablet Take 1 tablet (500 mg total) by mouth 2 (two) times daily with a meal. 180 tablet 0   metoprolol tartrate (LOPRESSOR) 25 MG tablet Take 1 tablet (25 mg total) by mouth daily. 90 tablet 3   naproxen sodium (ALEVE) 220 MG tablet Take 220 mg by mouth.     podofilox (CONDYLOX) 0.5 % external solution Apply topically 2 (two) times daily. Twice a day for 3 days and then stop for four days. Can be repeated 4 times 3.5 mL 1   traZODone (DESYREL) 50 MG tablet Take 1 tablet (50 mg total)  by mouth at bedtime as needed for sleep. 90 tablet 1   triamcinolone cream (KENALOG) 0.1 % Apply 1 application topically 2 (two) times daily. 30 g 0   triamterene-hydrochlorothiazide (MAXZIDE) 75-50 MG tablet Take 1 tablet by mouth daily. 90 tablet 3   No current facility-administered medications for this encounter.    No Known Allergies  Social History   Socioeconomic History   Marital status: Married    Spouse name: Not on file   Number of children: Not on file   Years of education: Not on file   Highest education level: Not on file  Occupational History   Not on file  Tobacco Use   Smoking status: Former    Packs/day: 0.50    Years: 30.00    Pack years: 15.00    Types: Cigarettes    Quit  date: 09/07/2005    Years since quitting: 15.6   Smokeless tobacco: Never  Vaping Use   Vaping Use: Never used  Substance and Sexual Activity   Alcohol use: Yes    Comment: FEW SHOTS A WEEK   Drug use: No   Sexual activity: Not on file  Other Topics Concern   Not on file  Social History Narrative   Works for [employer]    Married   m   Social Determinants of Radio broadcast assistant Strain: Low Risk    Difficulty of Paying Living Expenses: Not hard at all  Food Insecurity: No Food Insecurity   Worried About Charity fundraiser in the Last Year: Never true   Arboriculturist in the Last Year: Never true  Transportation Needs: No Transportation Needs   Lack of Transportation (Medical): No   Lack of Transportation (Non-Medical): No  Physical Activity: Insufficiently Active   Days of Exercise per Week: 3 days   Minutes of Exercise per Session: 40 min  Stress: No Stress Concern Present   Feeling of Stress : Not at all  Social Connections: Moderately Isolated   Frequency of Communication with Friends and Family: Twice a week   Frequency of Social Gatherings with Friends and Family: Twice a week   Attends Religious Services: Never   Marine scientist or Organizations: No   Attends Music therapist: Never   Marital Status: Married  Human resources officer Violence: Not At Risk   Fear of Current or Ex-Partner: No   Emotionally Abused: No   Physically Abused: No   Sexually Abused: No    Family History  Problem Relation Age of Onset   Hypertension Other     ROS- All systems are reviewed and negative except as per the HPI above  Physical Exam: There were no vitals filed for this visit. Wt Readings from Last 3 Encounters:  02/27/21 85.7 kg  01/31/21 86.2 kg  01/27/21 82.1 kg    Labs: Lab Results  Component Value Date   NA 138 02/28/2021   K 4.0 02/28/2021   CL 100 02/28/2021   CO2 29 02/28/2021   GLUCOSE 133 (H) 02/28/2021   BUN 25 (H)  02/28/2021   CREATININE 1.04 02/28/2021   CALCIUM 9.8 02/28/2021   No results found for: INR Lab Results  Component Value Date   CHOL 155 02/28/2021   HDL 39.20 02/28/2021   LDLCALC 88 02/28/2021   TRIG 138.0 02/28/2021     GEN- The patient is well appearing, alert and oriented x 3 today.   Head- normocephalic, atraumatic Eyes-  Sclera  clear, conjunctiva pink Ears- hearing intact Oropharynx- clear Neck- supple, no JVP Lymph- no cervical lymphadenopathy Lungs- Clear to ausculation bilaterally, normal work of breathing Heart- Regular rate and rhythm, no murmurs, rubs or gallops, PMI not laterally displaced GI- soft, NT, ND, + BS Extremities- no clubbing, cyanosis, or edema MS- no significant deformity or atrophy Skin- no rash or lesion Psych- euthymic mood, full affect Neuro- strength and sensation are intact  EKG-atrial flutter at 109 bpm, qrs int 90 ms, qtc 463 ms  Epic records reviewed    Assessment and Plan:  1. Paroxysmal afib/flutter   General education re afib/flutter  Triggers discussed  I will change metoprolol tartrate that pt is taking only once daily to metoprolol succinate 25 mg daily for better coverage  Echo ordered   2. CHA2DS2VASc  score of 2 Discussed stroke risk No bleeding history Will start xarelto 20 mg daily at supper Bleeding precautions discussed He will need to stop 2 days before upcoming coloscopy in February  Stop asa   3. HTN Stable   F/u will be based after  echo results reviewed   Butch Penny C. Hersel Mcmeen, [facility] [address] [place], [place] [address] [phone]

## 2021-04-17 ENCOUNTER — Encounter (HOSPITAL_COMMUNITY): Payer: Self-pay | Admitting: Nurse Practitioner

## 2021-04-17 ENCOUNTER — Ambulatory Visit (HOSPITAL_COMMUNITY)
Admission: RE | Admit: 2021-04-17 | Discharge: 2021-04-17 | Disposition: A | Discharge status: Home / Self Care | Payer: Medicare HMO | Source: Ambulatory Visit | Attending: Nurse Practitioner | Admitting: Nurse Practitioner

## 2021-04-17 DIAGNOSIS — I10 Essential (primary) hypertension: Secondary | ICD-10-CM | POA: Diagnosis not present

## 2021-04-17 DIAGNOSIS — I480 Paroxysmal atrial fibrillation: Secondary | ICD-10-CM | POA: Diagnosis not present

## 2021-04-17 LAB — ECHOCARDIOGRAM COMPLETE
Area-P 1/2: 2.99 cm2
Calc EF: 54.1 %
S' Lateral: 3.2 cm
Single Plane A2C EF: 58.9 %
Single Plane A4C EF: 55.4 %

## 2021-04-17 NOTE — Progress Notes (Signed)
°  Echocardiogram 2D Echocardiogram has been performed.  Jose Evans 04/17/2021, 3:45 PM

## 2021-04-20 ENCOUNTER — Other Ambulatory Visit: Payer: Self-pay | Admitting: Adult Health

## 2021-04-20 DIAGNOSIS — G479 Sleep disorder, unspecified: Secondary | ICD-10-CM

## 2021-04-20 DIAGNOSIS — G8929 Other chronic pain: Secondary | ICD-10-CM

## 2021-05-02 ENCOUNTER — Telehealth: Payer: Self-pay | Admitting: Gastroenterology

## 2021-05-02 NOTE — Telephone Encounter (Signed)
Patient have a procedure on 2/6 with Dr. Ardis Hughs. He is now on Xarelto and need an OV. Could I schedule him a virtual OV with APP in order to keep his procedure date the same? I have already cancelled his PV

## 2021-05-02 NOTE — Telephone Encounter (Signed)
Yes if there is a virtual opening with an appt that will be fine.

## 2021-05-08 ENCOUNTER — Telehealth: Payer: Medicare HMO | Admitting: Physician Assistant

## 2021-05-08 ENCOUNTER — Telehealth: Payer: Self-pay | Admitting: Physician Assistant

## 2021-05-08 ENCOUNTER — Encounter: Payer: Self-pay | Admitting: Physician Assistant

## 2021-05-08 VITALS — Ht 69.0 in | Wt 179.0 lb

## 2021-05-08 DIAGNOSIS — R195 Other fecal abnormalities: Secondary | ICD-10-CM | POA: Diagnosis not present

## 2021-05-08 DIAGNOSIS — R1084 Generalized abdominal pain: Secondary | ICD-10-CM

## 2021-05-08 DIAGNOSIS — I4891 Unspecified atrial fibrillation: Secondary | ICD-10-CM

## 2021-05-08 DIAGNOSIS — Z7901 Long term (current) use of anticoagulants: Secondary | ICD-10-CM

## 2021-05-08 NOTE — Patient Instructions (Signed)
Jose Evans,  As you know you are already scheduled for colonoscopy 05/27/2021 with Dr. Ardis Hughs at [phone].  Please arrive 1 hour prior to your scheduled visit time.  As we discussed our nursing staff will be calling you within the next week to discuss bowel prep instructions and your bowel prep will be sent to the pharmacy at that time.  I have already sent communication to the A. fib clinic to ensure that holding your Xarelto is acceptable for you.  Again this is typically 2 days prior to time of the procedure.  This will be communicated with you by our staff when discussing bowel prep for the colonoscopy.  If you would like further work-up of these abdominal pain episodes, pending results from colonoscopy would recommend that you are seen in clinic for further discussion.  Sincerely, Ellouise Newer, PA-C

## 2021-05-08 NOTE — Telephone Encounter (Signed)
[facility] Pre-operative Risk Assessment     Request for surgical clearance:     Endoscopy Procedure  What type of surgery is being performed?     Colonoscopy  When is this surgery scheduled?     05/27/21  What type of clearance is required ?   Pharmacy  Are there any medications that need to be held prior to surgery and how long? Xarelto, 2 days  Practice name and name of physician performing surgery?      [facility]  What is your office phone and fax number?      Phone- [phone]  [phone]  Anesthesia type (None, local, MAC, general) ?       MAC

## 2021-05-08 NOTE — Progress Notes (Signed)
I agree with the above note, plan 

## 2021-05-08 NOTE — Telephone Encounter (Signed)
Clinical pharmacist to review Xarelto 

## 2021-05-08 NOTE — Progress Notes (Addendum)
I connected with  Jose Evans on 05/08/21 by a video enabled telemedicine application and verified that I am speaking with the correct person using two identifiers.   I discussed the limitations of evaluation and management by telemedicine. The patient expressed understanding and agreed to proceed.   Patient located at home Provider located at [place]  Chief Complaint: Positive Cologuard, abdominal pains  HPI:    Jose Evans is a 76 year old male with a past medical history as listed below including hypertension and A. fib on Xarelto (04/17/2021 LVEF 60-65%), hypothyroidism, gout and ADHD, who is seen today for a virtual visit to discuss a recently positive Cologuard test and some abdominal pains.    02/28/2021 CMP with an elevated glucose at 133 and otherwise normal, CBC and hemoglobin A1c normal.    03/26/2021 positive Cologuard.    Patient is already scheduled for a colonoscopy 05/27/2021 with Dr. Ardis Hughs.    Today, patient is seen virtually to discuss his positive Cologuard test.  He is aware that this means that there are likely polyps growing in his colon.  He is willing to have his colonoscopy.  Tells me he has been avoiding this for 73 years because he had a colonoscopy back in his 1s which was done unsedated in the office per him and was very uncomfortable.  Describes it at that time he was having some rectal bleeding but does not know the cause and eventually took some probiotics and this got better.    Today, the patient also describes that over the past 10 to 12 years he has had "attacks", where he develops a lower abdominal pain around his waist that moves up and feels like a "brain freeze" and goes into his teeth and chest and is very painful and feels like a "upper body cramp".  About 4 to 5 years ago this was happening daily and he spoke with his PCP about it but no one could ever figure anything out.  Most recently he has been diagnosed with A. fib and has been told there may  be a correlation between this feeling and episodes of Afib.  Currently if he feels like he is going to have an attack he will use a Metoprolol and an antacid which seems to help and this will pass and ease without getting severe.  Typically his episodes can last 4 to 5 hours.  He has noticed some specific triggers including "whiskey".    Denies fever, chills, weight loss, continued blood in his stool or change in bowel habits.     Past Medical History:  Diagnosis Date   Adult acne    Cancer ([place]) 07/2017   skin cancer   Gout    Hypertension    Thyroid disease     Past Surgical History:  Procedure Laterality Date   HERNIA REPAIR     SHOULDER SURGERY     TONSILLECTOMY      Current Outpatient Medications  Medication Sig Dispense Refill   allopurinol (ZYLOPRIM) 300 MG tablet Take 1 tablet (300 mg total) by mouth daily. 90 tablet 3   amphetamine-dextroamphetamine (ADDERALL) 10 MG tablet Take 1 tablet (10 mg total) by mouth daily. 30 tablet 0   amphetamine-dextroamphetamine (ADDERALL) 10 MG tablet Take 1 tablet (10 mg total) by mouth daily. 30 tablet 0   amphetamine-dextroamphetamine (ADDERALL) 10 MG tablet Take 1 tablet (10 mg total) by mouth daily. 30 tablet 0   Aspirin 325 MG CAPS Take 325 mg by mouth daily.  90 capsule 0   doxycycline (VIBRAMYCIN) 100 MG capsule Take 1 capsule (100 mg total) by mouth 2 (two) times daily. (Patient not taking: Reported on 04/16/2021) 60 capsule 3   finasteride (PROSCAR) 5 MG tablet Take 1 tablet (5 mg total) by mouth daily. 90 tablet 3   fluoruracil (CARAC) 0.5 % cream Apply 1 application topically daily as needed.     HYDROcodone-acetaminophen (NORCO/VICODIN) 5-325 MG tablet Take 1 tablet by mouth every 4 (four) hours as needed. 10 tablet 0   levothyroxine (SYNTHROID) 175 MCG tablet TAKE 1 TABLET BY MOUTH EVERY DAY 90 tablet 1   lisinopril (ZESTRIL) 5 MG tablet Take 1 tablet (5 mg total) by mouth daily. 90 tablet 3   meloxicam (MOBIC) 7.5 MG tablet Take  1 tablet by mouth daily as needed.  2   metFORMIN (GLUCOPHAGE) 500 MG tablet Take 1 tablet (500 mg total) by mouth 2 (two) times daily with a meal. (Patient taking differently: Take 500 mg by mouth daily with breakfast.) 180 tablet 0   metoprolol succinate (TOPROL XL) 25 MG 24 hr tablet Take 1 tablet (25 mg total) by mouth daily. 30 tablet 3   podofilox (CONDYLOX) 0.5 % external solution Apply topically 2 (two) times daily. Twice a day for 3 days and then stop for four days. Can be repeated 4 times 3.5 mL 1   rivaroxaban (XARELTO) 20 MG TABS tablet Take 1 tablet (20 mg total) by mouth daily with supper. 30 tablet 3   traZODone (DESYREL) 50 MG tablet TAKE 1 TABLET BY MOUTH AT BEDTIME AS NEEDED FOR SLEEP. 90 tablet 1   triamcinolone cream (KENALOG) 0.1 % Apply 1 application topically 2 (two) times daily. 30 g 0   triamterene-hydrochlorothiazide (MAXZIDE) 75-50 MG tablet Take 1 tablet by mouth daily. 90 tablet 3   No current facility-administered medications for this visit.    Allergies as of 05/08/2021   (No Known Allergies)    Family History  Problem Relation Age of Onset   Hypertension Other     Social History   Socioeconomic History   Marital status: Married    Spouse name: Not on file   Number of children: Not on file   Years of education: Not on file   Highest education level: Not on file  Occupational History   Not on file  Tobacco Use   Smoking status: Former    Packs/day: 0.50    Years: 30.00    Pack years: 15.00    Types: Cigarettes    Quit date: 09/07/2005    Years since quitting: 15.6   Smokeless tobacco: Never  Vaping Use   Vaping Use: Never used  Substance and Sexual Activity   Alcohol use: Yes    Comment: FEW SHOTS A WEEK   Drug use: No   Sexual activity: Not on file  Other Topics Concern   Not on file  Social History Narrative   Works for [employer]    Married   m   Social Determinants of Radio broadcast assistant Strain: Low Risk     Difficulty of Paying Living Expenses: Not hard at all  Food Insecurity: No Food Insecurity   Worried About Charity fundraiser in the Last Year: Never true   Arboriculturist in the Last Year: Never true  Transportation Needs: No Transportation Needs   Lack of Transportation (Medical): No   Lack of Transportation (Non-Medical): No  Physical Activity: Insufficiently Active  Days of Exercise per Week: 3 days   Minutes of Exercise per Session: 40 min  Stress: No Stress Concern Present   Feeling of Stress : Not at all  Social Connections: Moderately Isolated   Frequency of Communication with Friends and Family: Twice a week   Frequency of Social Gatherings with Friends and Family: Twice a week   Attends Religious Services: Never   Marine scientist or Organizations: No   Attends Music therapist: Never   Marital Status: Married  Human resources officer Violence: Not At Risk   Fear of Current or Ex-Partner: No   Emotionally Abused: No   Physically Abused: No   Sexually Abused: No    Review of Systems:    Constitutional: No weight loss, fever or chills Skin: No rash  Cardiovascular: No chest pain Respiratory: No SOB Gastrointestinal: See HPI and otherwise negative Genitourinary: No dysuria  Neurological: No headache, dizziness or syncope Musculoskeletal: No new muscle or joint pain Hematologic: No bleeding  Psychiatric: No history of depression or anxiety   Physical Exam:  Vital signs:  Virtual visit.  Reported height 5\' 9" and weight 179 pounds, Ht 5\' 9"  (1.753 m)    Wt 179 lb (81.2 kg)    BMI 26.43 kg/m    Constitutional:   Pleasant Caucasian male appears to be in NAD, Well developed, Well nourished, alert and cooperative Head:  Normocephalic and atraumatic. Eyes:    No icterus. Conjunctiva pink. Ears:  Normal auditory acuity. Psychiatric:  Demonstrates good judgement and reason without abnormal affect or behaviors.  MOST RECENT LABS AND IMAGING: CBC     Component Value Date/Time   WBC 6.2 02/28/2021 0833   RBC 4.76 02/28/2021 0833   HGB 15.1 02/28/2021 0833   HCT 43.1 02/28/2021 0833   PLT 218.0 02/28/2021 0833   MCV 90.5 02/28/2021 0833   MCH 31.7 01/26/2020 1338   MCHC 35.0 02/28/2021 0833   RDW 13.8 02/28/2021 0833   LYMPHSABS 1.4 02/28/2021 0833   MONOABS 0.5 02/28/2021 0833   EOSABS 0.1 02/28/2021 0833   BASOSABS 0.1 02/28/2021 0833    CMP     Component Value Date/Time   NA 138 02/28/2021 0833   K 4.0 02/28/2021 0833   CL 100 02/28/2021 0833   CO2 29 02/28/2021 0833   GLUCOSE 133 (H) 02/28/2021 0833   BUN 25 (H) 02/28/2021 0833   CREATININE 1.04 02/28/2021 0833   CREATININE 1.14 01/26/2020 1338   CALCIUM 9.8 02/28/2021 0833   PROT 6.7 02/28/2021 0833   ALBUMIN 4.7 02/28/2021 0833   AST 21 02/28/2021 0833   ALT 29 02/28/2021 0833   ALKPHOS 65 02/28/2021 0833   BILITOT 0.8 02/28/2021 0833   GFRNONAA 64 01/26/2020 1338   GFRAA 74 01/26/2020 1338    Assessment: 1.  Positive Cologuard: 03/26/2021 positive Cologuard, patient has been scheduled for colonoscopy 05/27/2021, currently on Xarelto for A. fib 2.  Abdominal pain: Describes pain episodes that start in his lower abdomen and radiate up into his chest and even jaw, does correlate with attacks of A. Fib; consider A. fib less likely 3.  Chronic anticoagulation: On Xarelto for A. fib  Plan: 1.  Patient is already scheduled for colonoscopy 05/27/2021 with Dr. Ardis Hughs in the [facility].  He will be contacted by one of our nursing staff within the next week to discuss bowel prep instructions.  Explained that a bowel prep will also be sent in for him at that time. 2.  I have  sent communication to the [place] clinic and Roderic Palau in regards to holding his Xarelto for 2 days prior to time of procedure.  We will make sure this is acceptable for him. 3.  Discussed with patient that most likely his pain episodes are stemming from A. fib.  He should continue doing what  he is doing with the Metoprolol and an antacid as this seems to be helping him.  If he would like further evaluation of this in the future would need to be seen in clinic. 4.  Patient follow in clinic per recommendations from Dr. Ardis Hughs after time of procedure.  Jose Newer, PA-C [facility] 05/08/2021, 9:08 AM  Cc: [name], NP

## 2021-05-08 NOTE — Telephone Encounter (Signed)
Mr. keondre, markson are scheduled for a virtual visit with your provider today.    Just as we do with appointments in the office, we must obtain your consent to participate.  Your consent will be active for this visit and any virtual visit you may have with one of our providers in the next 365 days.    If you have a MyChart account, I can also send a copy of this consent to you electronically.  All virtual visits are billed to your insurance company just like a traditional visit in the office.  As this is a virtual visit, video technology does not allow for your provider to perform a traditional examination.  This may limit your provider's ability to fully assess your condition.  If your provider identifies any concerns that need to be evaluated in person or the need to arrange testing such as labs, EKG, etc, we will make arrangements to do so.    Although advances in technology are sophisticated, we cannot ensure that it will always work on either your end or our end.  If the connection with a video visit is poor, we may have to switch to a telephone visit.  With either a video or telephone visit, we are not always able to ensure that we have a secure connection.   I need to obtain your verbal consent now.   Are you willing to proceed with your visit today?   Erickson Yamashiro has provided verbal consent on 05/08/2021 for a virtual visit (video or telephone).   [place], [place] 05/08/2021  9:16 AM

## 2021-05-08 NOTE — Telephone Encounter (Signed)
Patient with diagnosis of afib on Xarelto for anticoagulation.    Procedure: colonoscopy Date of procedure: 05/27/21  CHA2DS2-VASc Score = 3  This indicates a 3.2% annual risk of stroke. The patient's score is based upon: CHF History: 0 HTN History: 1 Diabetes History: 1 Stroke History: 0 Vascular Disease History: 0 Age Score: 1 Gender Score: 0   Doesn't have DM listed on PMH but most recent A1c from 2 months ago is 6.5% and pt is on metformin.  CrCl 5mL/min Platelet count 218K  Per office protocol, patient can hold Xarelto for 2 days prior to procedure.

## 2021-05-09 NOTE — Telephone Encounter (Signed)
Placed call to the pt and he has been advised to hold xarelto 2 days prior to his upcoming procedure.  The pt has been advised of the information and verbalized understanding.

## 2021-05-13 DIAGNOSIS — L814 Other melanin hyperpigmentation: Secondary | ICD-10-CM | POA: Diagnosis not present

## 2021-05-13 DIAGNOSIS — Z85828 Personal history of other malignant neoplasm of skin: Secondary | ICD-10-CM | POA: Diagnosis not present

## 2021-05-13 DIAGNOSIS — L570 Actinic keratosis: Secondary | ICD-10-CM | POA: Diagnosis not present

## 2021-05-13 DIAGNOSIS — D1801 Hemangioma of skin and subcutaneous tissue: Secondary | ICD-10-CM | POA: Diagnosis not present

## 2021-05-13 DIAGNOSIS — Z08 Encounter for follow-up examination after completed treatment for malignant neoplasm: Secondary | ICD-10-CM | POA: Diagnosis not present

## 2021-05-13 DIAGNOSIS — L821 Other seborrheic keratosis: Secondary | ICD-10-CM | POA: Diagnosis not present

## 2021-05-15 ENCOUNTER — Other Ambulatory Visit: Payer: Self-pay | Admitting: Adult Health

## 2021-05-22 ENCOUNTER — Telehealth: Payer: Self-pay | Admitting: Pharmacist

## 2021-05-22 NOTE — Chronic Care Management (AMB) (Signed)
Chronic Care Management Pharmacy Assistant   Name: Jose Evans  MRN: [id number] DOB: 1945/10/25  Reason for Encounter: Disease State / Hypertension Assessment Call   Conditions to be addressed/monitored: HTN   Recent office visits:  None  Recent consult visits:  05/08/2021 Ellouise Newer PA (GI) - Patient was seen for Positive colorectal cancer screening using Cologuard test and additional issues. No medication changes. Follow up for colonoscopy.  Hospital visits:  None  Medications: Outpatient Encounter Medications as of 05/22/2021  Medication Sig   allopurinol (ZYLOPRIM) 300 MG tablet Take 1 tablet (300 mg total) by mouth daily.   amphetamine-dextroamphetamine (ADDERALL) 10 MG tablet Take 1 tablet (10 mg total) by mouth daily.   amphetamine-dextroamphetamine (ADDERALL) 10 MG tablet Take 1 tablet (10 mg total) by mouth daily.   amphetamine-dextroamphetamine (ADDERALL) 10 MG tablet Take 1 tablet (10 mg total) by mouth daily.   Aspirin 325 MG CAPS Take 325 mg by mouth daily.   finasteride (PROSCAR) 5 MG tablet Take 1 tablet (5 mg total) by mouth daily.   fluoruracil (CARAC) 0.5 % cream Apply 1 application topically daily as needed.   HYDROcodone-acetaminophen (NORCO/VICODIN) 5-325 MG tablet Take 1 tablet by mouth every 4 (four) hours as needed.   levothyroxine (SYNTHROID) 175 MCG tablet TAKE 1 TABLET BY MOUTH EVERY DAY   lisinopril (ZESTRIL) 5 MG tablet Take 1 tablet (5 mg total) by mouth daily.   meloxicam (MOBIC) 7.5 MG tablet Take 1 tablet by mouth daily as needed.   metFORMIN (GLUCOPHAGE) 500 MG tablet Take 1 tablet (500 mg total) by mouth 2 (two) times daily with a meal. (Patient taking differently: Take 500 mg by mouth daily with breakfast.)   metoprolol succinate (TOPROL XL) 25 MG 24 hr tablet Take 1 tablet (25 mg total) by mouth daily.   podofilox (CONDYLOX) 0.5 % external solution Apply topically 2 (two) times daily. Twice a day for 3 days and then stop for four days.  Can be repeated 4 times   rivaroxaban (XARELTO) 20 MG TABS tablet Take 1 tablet (20 mg total) by mouth daily with supper.   traZODone (DESYREL) 50 MG tablet TAKE 1 TABLET BY MOUTH AT BEDTIME AS NEEDED FOR SLEEP.   triamcinolone cream (KENALOG) 0.1 % Apply 1 application topically 2 (two) times daily.   triamterene-hydrochlorothiazide (MAXZIDE) 75-50 MG tablet Take 1 tablet by mouth daily.   No facility-administered encounter medications on file as of 05/22/2021.  Fill History: ALLOPURINOL 300 MG TABLET 02/27/2021 90   AMPHET/DEXTR 10MG  TAB 05/13/2021 30   ASPIRIN EC 325 MG TABLET 01/31/2021 100   FINASTERIDE 5 MG TABLET 02/27/2021 90   LEVOTHYROXINE 175 MCG TABLET 04/15/2021 90   LISINOPRIL 5 MG TABLET 02/27/2021 90   METOPROLOL SUCC ER 25 MG TAB 04/16/2021 90   XARELTO 20 MG TABLET 04/16/2021 30   TRIAMTERENE-HCTZ 75-50 MG TAB 02/27/2021 90   METFORMIN HCL 500 MG TABLET 03/20/2021 90   TRAZODONE 50 MG TABLET 04/23/2021 90   Reviewed chart prior to disease state call. Spoke with patient regarding BP  Recent Office Vitals: BP Readings from Last 3 Encounters:  04/16/21 (!) 144/76  02/27/21 140/90  01/31/21 130/86   Pulse Readings from Last 3 Encounters:  04/16/21 (!) 109  02/27/21 (!) 52  01/31/21 64    Wt Readings from Last 3 Encounters:  05/08/21 179 lb (81.2 kg)  04/16/21 184 lb (83.5 kg)  02/27/21 189 lb (85.7 kg)     Kidney Function Lab Results  Component Value Date/Time   CREATININE 1.04 02/28/2021 08:33 AM   CREATININE 1.14 01/26/2020 01:38 PM   CREATININE 1.00 01/25/2019 09:36 AM   GFR 71.30 02/28/2021 08:33 AM   GFRNONAA 64 01/26/2020 01:38 PM   GFRAA 74 01/26/2020 01:38 PM    BMP Latest Ref Rng & Units 02/28/2021 01/26/2020 01/25/2019  Glucose 70 - 99 mg/dL 133(H) 122(H) 124(H)  BUN 6 - 23 mg/dL 25(H) 29(H) 24(H)  Creatinine 0.40 - 1.50 mg/dL 1.04 1.14 1.00  BUN/Creat Ratio 6 - 22 (calc) - 25(H) -  Sodium 135 - 145 mEq/L 138 140 138  Potassium 3.5  - 5.1 mEq/L 4.0 4.3 4.1  Chloride 96 - 112 mEq/L 100 102 100  CO2 19 - 32 mEq/L 29 23 29   Calcium 8.4 - 10.5 mg/dL 9.8 10.1 10.0    Current antihypertensive regimen:  Metoprolol 25 mg daily Maxzide 75/50 mg daily Lisinopril 5 mg daily  How often are you checking your Blood Pressure?   Current home BP readings:   What recent interventions/DTPs have been made by any provider to improve Blood Pressure control since last CPP Visit: No recent interventions.   Any recent hospitalizations or ED visits since last visit with CPP? No recent hospital visits.   What diet changes have been made to improve Blood Pressure Control?    What exercise is being done to improve your Blood Pressure Control?    Adherence Review: Is the patient currently on ACE/ARB medication? Yes Does the patient have >5 day gap between last estimated fill dates? No   Care Gaps: AWV - completed 01/17/2021 Last BP - 130/86 on 01/31/2021 Last A1C - 6.4 on 01/26/2020 Shingrix - never done Colonoscopy - scheduled Covid vaccine - overdue  Star Rating Drugs: Lisinopril 5 mg - last filled 02/27/2021 90 DS at [facility] Metformin 500 mg - last filled 03/20/2021 90 DS at [facility] Pharmacist Assistant [phone]

## 2021-05-23 ENCOUNTER — Encounter: Payer: Self-pay | Admitting: Gastroenterology

## 2021-05-23 DIAGNOSIS — R195 Other fecal abnormalities: Secondary | ICD-10-CM

## 2021-05-23 MED ORDER — GOLYTELY 236 G PO SOLR: 4000.0000 mL | Freq: Once | ORAL | 0 refills | Status: AC

## 2021-05-23 NOTE — Telephone Encounter (Unsigned)
Golytely prep sent to pt's pharmacy. Instructions sent to patient via my chart. Ambulatory referral to GI in epic.   I called and spoke with patient. I did apologize about the delay in sending his prep. I told him that his instructions will be available in my chart for his review. He reports that he is on Metformin also. I reminded him to start holding his Xarelto on 05/25/21, he knows that he will be instructed when to resume his Xarelto after the procedure. Pt verbalized understanding of all information and had no concerns at the end of the call.

## 2021-05-25 ENCOUNTER — Other Ambulatory Visit: Payer: Self-pay | Admitting: Adult Health

## 2021-05-25 DIAGNOSIS — M1A20X0 Drug-induced chronic gout, unspecified site, without tophus (tophi): Secondary | ICD-10-CM

## 2021-05-27 ENCOUNTER — Ambulatory Visit: Payer: Medicare HMO | Admitting: Gastroenterology

## 2021-05-27 ENCOUNTER — Emergency Department (HOSPITAL_COMMUNITY)
Admission: EM | Admit: 2021-05-27 | Discharge: 2021-05-27 | Disposition: A | Discharge status: Home / Self Care | Payer: Medicare HMO | Attending: Emergency Medicine | Admitting: Emergency Medicine

## 2021-05-27 ENCOUNTER — Encounter: Payer: Self-pay | Admitting: Gastroenterology

## 2021-05-27 ENCOUNTER — Encounter (HOSPITAL_COMMUNITY): Payer: Self-pay

## 2021-05-27 VITALS — BP 161/95 | HR 149 | Temp 97.5°F | Resp 12 | Ht 69.0 in | Wt 189.0 lb

## 2021-05-27 DIAGNOSIS — R000 Tachycardia, unspecified: Secondary | ICD-10-CM | POA: Diagnosis present

## 2021-05-27 DIAGNOSIS — I10 Essential (primary) hypertension: Secondary | ICD-10-CM | POA: Insufficient documentation

## 2021-05-27 DIAGNOSIS — Z79899 Other long term (current) drug therapy: Secondary | ICD-10-CM | POA: Diagnosis not present

## 2021-05-27 DIAGNOSIS — R195 Other fecal abnormalities: Secondary | ICD-10-CM

## 2021-05-27 DIAGNOSIS — Z7901 Long term (current) use of anticoagulants: Secondary | ICD-10-CM | POA: Insufficient documentation

## 2021-05-27 DIAGNOSIS — I4891 Unspecified atrial fibrillation: Secondary | ICD-10-CM | POA: Diagnosis not present

## 2021-05-27 LAB — CBC
HCT: 52.5 % — ABNORMAL HIGH (ref 39.0–52.0)
Hemoglobin: 18.3 g/dL — ABNORMAL HIGH (ref 13.0–17.0)
MCH: 31.3 pg (ref 26.0–34.0)
MCHC: 34.9 g/dL (ref 30.0–36.0)
MCV: 89.9 fL (ref 80.0–100.0)
Platelets: 252 K/uL (ref 150–400)
RBC: 5.84 MIL/uL — ABNORMAL HIGH (ref 4.22–5.81)
RDW: 13.1 % (ref 11.5–15.5)
WBC: 8.4 K/uL (ref 4.0–10.5)
nRBC: 0 % (ref 0.0–0.2)

## 2021-05-27 LAB — BASIC METABOLIC PANEL
Anion gap: 14 (ref 5–15)
BUN: 19 mg/dL (ref 8–23)
CO2: 26 mmol/L (ref 22–32)
Calcium: 9.8 mg/dL (ref 8.9–10.3)
Chloride: 101 mmol/L (ref 98–111)
Creatinine, Ser: 0.92 mg/dL (ref 0.61–1.24)
GFR, Estimated: >60 mL/min (ref >60)
Glucose, Bld: 135 mg/dL — ABNORMAL HIGH (ref 70–99)
Potassium: 3.9 mmol/L (ref 3.5–5.1)
Sodium: 141 mmol/L (ref 135–145)

## 2021-05-27 LAB — MAGNESIUM: Magnesium: 1.7 mg/dL (ref 1.7–2.4)

## 2021-05-27 LAB — TROPONIN I (HIGH SENSITIVITY): Troponin I (High Sensitivity): 11 ng/L (ref <18)

## 2021-05-27 MED ORDER — DILTIAZEM HCL 30 MG PO TABS
60.0000 mg | ORAL_TABLET | Freq: Once | ORAL | Status: AC
  Administered 2021-05-27: 60 mg via ORAL
  Filled 2021-05-27: qty 2

## 2021-05-27 MED ORDER — RIVAROXABAN 20 MG PO TABS
20.0000 mg | ORAL_TABLET | Freq: Once | ORAL | Status: AC
  Administered 2021-05-27: 20 mg via ORAL
  Filled 2021-05-27: qty 1

## 2021-05-27 MED ORDER — RIVAROXABAN 15 MG PO TABS: 15.0000 mg | ORAL_TABLET | Freq: Once | ORAL | Status: DC

## 2021-05-27 MED ORDER — SODIUM CHLORIDE 0.9 % IV SOLN: 500.0000 mL | Freq: Once | INTRAVENOUS | Status: DC

## 2021-05-27 MED ORDER — DILTIAZEM HCL ER 120 MG PO CP12: 120.0000 mg | ORAL_CAPSULE | Freq: Every day | ORAL | 0 refills | Status: DC

## 2021-05-27 MED ORDER — DILTIAZEM HCL 25 MG/5ML IV SOLN
20.0000 mg | Freq: Once | INTRAVENOUS | Status: AC
Start: 2021-05-27 — End: 2021-05-27
  Administered 2021-05-27: 20 mg via INTRAVENOUS
  Filled 2021-05-27: qty 5

## 2021-05-27 NOTE — Discharge Instructions (Addendum)
You have been seen and discharged from the emergency department.  You were given an extra dose of IV medication for rate control.  Atrial fibrillation clinic was consulted and recommends adding Cardizem to your daily regimen.  You were given a dose of Xarelto here in the department.  Resume your Xarelto and start your Cardizem tomorrow 05/28/2021.  Continue your metoprolol as well.  Follow-up with your appointment at the atrial fibrillation clinic for further evaluation and further care. Take home medications as prescribed. If you have any worsening symptoms or further concerns for your health please return to an emergency department for further evaluation.

## 2021-05-27 NOTE — ED Notes (Signed)
Pt NAD I standing in room, a/ox4. Pt verbalizes no complaints at this time, denying CP, SOB, palpitations or dizziness.

## 2021-05-27 NOTE — Progress Notes (Signed)
°  The recent H&P (dated 05/08/2021) was reviewed, the patient was examined and there is no change in the patients condition since that H&P was completed.   Milus Banister  05/27/2021, 10:57 AM   In endoscopy room he went into rapid afib (130s to 150s). This has been happening for years probably but only proven 5-6 weeks ago.  He has no chest pains.  No SOB. Does feel it in the back of his neck which is usually the only place he 'feels' the HR.  We gave him 100mg  esmolol over about 10 minutes and this did not improve his rapid afib.  I spoke with a provider at Afib clinic and they agreed with decision to not proceed with colonoscoyp. They recommended he take another of his home metoprolol now or when he gets home. They will reach out to him later today and he is already planning to see thme in 3 days.  He took his metoprolol 25mg .  I went back out to recovery and he was still in rapid afib, up to 160s now.  His wife and I were increasingly uncomfortable with him at that rate and so we decided he would go to ER at [facility] for HR control.   I alerted afib clinic about the change in plans

## 2021-05-27 NOTE — ED Triage Notes (Signed)
Pt presents with c/o tachycardia. Pt reports he was set to have a colonoscopy this morning and was sent over here reference his HR. Pt denies any chest pain or shortness of breath. Pt's wife reports that she gave him 25 mg of metoprolol approx 15 minutes ago.

## 2021-05-27 NOTE — ED Provider Notes (Signed)
[facility] DEPT Provider Note   CSN: [id number] Arrival date & time: 05/27/21  1201     History  Chief Complaint  Patient presents with   Tachycardia    Jose Evans is a 76 y.o. male.  HPI  76 year old male with past medical history of atrial fibrillation anticoagulated on Xarelto, HTN presents to the emergency department with concern for tachycardia.  Patient was scheduled for colonoscopy this morning, during induction he started having tachycardia.  Patient was discharged prior to the colonoscopy and encouraged to come to the ER.  Patient states he frequently has episodes of tachycardia even on his rate control medication.  Usually they are self resolved, limited.  This episode has been going on since last night.  He feels fatigued and like he has "brain freeze" but denies any chest pain, shortness of breath, back pain, swelling of his lower extremities.  He has held his Xarelto for the past 2 days for the colonoscopy but has otherwise been compliant.  Home Medications Prior to Admission medications   Medication Sig Start Date End Date Taking? Authorizing Provider  acetaminophen (TYLENOL) 500 MG tablet Take 1,000 mg by mouth every 6 (six) hours as needed for mild pain.   Yes [provider]  allopurinol (ZYLOPRIM) 300 MG tablet TAKE 1 TABLET BY MOUTH EVERY DAY Patient taking differently: Take 300 mg by mouth daily. 05/27/21  Yes Nafziger, Tommi Rumps, NP  amphetamine-dextroamphetamine (ADDERALL) 10 MG tablet Take 1 tablet (10 mg total) by mouth daily. 04/16/21 04/16/22 Yes Nafziger, Tommi Rumps, NP  diltiazem (CARDIZEM SR) 120 MG 12 hr capsule Take 1 capsule (120 mg total) by mouth daily. 05/27/21  Yes Izrael Peak, Alvin Critchley, DO  finasteride (PROSCAR) 5 MG tablet Take 1 tablet (5 mg total) by mouth daily. 02/27/21  Yes Nafziger, Tommi Rumps, NP  fluoruracil (CARAC) 0.5 % cream Apply 1 application topically daily as needed.   Yes [provider]  levothyroxine  (SYNTHROID) 175 MCG tablet TAKE 1 TABLET BY MOUTH EVERY DAY Patient taking differently: Take 175 mcg by mouth daily before breakfast. 01/16/21  Yes Nafziger, Tommi Rumps, NP  meloxicam (MOBIC) 7.5 MG tablet Take 1 tablet by mouth daily as needed. 06/05/17  Yes [provider]  metFORMIN (GLUCOPHAGE) 500 MG tablet Take 1 tablet (500 mg total) by mouth 2 (two) times daily with a meal. Patient taking differently: Take 500 mg by mouth daily with breakfast. 03/01/21  Yes Nafziger, Tommi Rumps, NP  metoprolol succinate (TOPROL XL) 25 MG 24 hr tablet Take 1 tablet (25 mg total) by mouth daily. 04/16/21 04/16/22 Yes [name], NP  podofilox (CONDYLOX) 0.5 % external solution Apply topically 2 (two) times daily. Twice a day for 3 days and then stop for four days. Can be repeated 4 times Patient taking differently: Apply 1 application topically 2 (two) times daily as needed (skin irritation). Twice a day for 3 days and then stop for four days. Can be repeated 4 times 03/01/20  Yes Nafziger, Tommi Rumps, NP  rivaroxaban (XARELTO) 20 MG TABS tablet Take 1 tablet (20 mg total) by mouth daily with supper. 04/16/21  Yes [name], NP  traZODone (DESYREL) 50 MG tablet TAKE 1 TABLET BY MOUTH AT BEDTIME AS NEEDED FOR SLEEP. Patient taking differently: Take 50 mg by mouth at bedtime as needed for sleep. 04/23/21  Yes Nafziger, Tommi Rumps, NP  triamcinolone cream (KENALOG) 0.1 % Apply 1 application topically 2 (two) times daily. 12/09/18  Yes Nafziger, Tommi Rumps, NP  triamterene-hydrochlorothiazide (MAXZIDE) 75-50  MG tablet Take 1 tablet by mouth daily. 02/27/21  Yes Nafziger, Tommi Rumps, NP  amphetamine-dextroamphetamine (ADDERALL) 10 MG tablet Take 1 tablet (10 mg total) by mouth daily. 04/16/21 05/17/21  Nafziger, Tommi Rumps, NP  lisinopril (ZESTRIL) 5 MG tablet Take 1 tablet (5 mg total) by mouth daily. Patient not taking: Reported on 05/27/2021 02/27/21   [name], NP      Allergies    Patient has no known allergies.    Review of  Systems   Review of Systems  Constitutional:  Negative for fatigue and fever.  Respiratory:  Negative for chest tightness and shortness of breath.   Cardiovascular:  Positive for palpitations. Negative for chest pain.  Gastrointestinal:  Negative for abdominal pain, diarrhea and vomiting.  Skin:  Negative for rash.  Neurological:  Negative for dizziness and headaches.   Physical Exam Updated Vital Signs BP (!) 125/93    Pulse 80    Resp 15    Ht 5\' 9"  (1.753 m)    Wt 80.3 kg    SpO2 95%    BMI 26.14 kg/m  Physical Exam Vitals and nursing note reviewed.  Constitutional:      General: He is not in acute distress.    Appearance: Normal appearance. He is not diaphoretic.  HENT:     Head: Normocephalic.     Mouth/Throat:     Mouth: Mucous membranes are moist.  Cardiovascular:     Rate and Rhythm: Tachycardia present. Rhythm irregular.  Pulmonary:     Effort: Pulmonary effort is normal. No respiratory distress.  Abdominal:     Palpations: Abdomen is soft.     Tenderness: There is no abdominal tenderness.  Musculoskeletal:        General: No swelling.  Skin:    General: Skin is warm.  Neurological:     Mental Status: He is alert and oriented to person, place, and time. Mental status is at baseline.  Psychiatric:        Mood and Affect: Mood normal.    ED Results / Procedures / Treatments   Labs (all labs ordered are listed, but only abnormal results are displayed) Labs Reviewed  BASIC METABOLIC PANEL - Abnormal; Notable for the following components:      Result Value   Glucose, Bld 135 (*)    All other components within normal limits  CBC - Abnormal; Notable for the following components:   RBC 5.84 (*)    Hemoglobin 18.3 (*)    HCT 52.5 (*)    All other components within normal limits  MAGNESIUM  TSH  TROPONIN I (HIGH SENSITIVITY)  TROPONIN I (HIGH SENSITIVITY)    EKG None  Radiology No results found.  Procedures .Critical Care Performed by: Lorelle Gibbs, DO Authorized by: Lorelle Gibbs, DO   Critical care provider statement:    Critical care time (minutes):  45   Critical care was necessary to treat or prevent imminent or life-threatening deterioration of the following conditions:  Circulatory failure   Critical care was time spent personally by me on the following activities:  Development of treatment plan with patient or surrogate, discussions with consultants, evaluation of patient's response to treatment, examination of patient, ordering and review of laboratory studies, ordering and review of radiographic studies, ordering and performing treatments and interventions, pulse oximetry, re-evaluation of patient's condition and review of old charts   I assumed direction of critical care for this patient from another provider in my specialty: no  Medications Ordered in ED Medications  diltiazem (CARDIZEM) tablet 60 mg (has no administration in time range)  diltiazem (CARDIZEM) injection 20 mg (20 mg Intravenous Given 05/27/21 1431)  rivaroxaban (XARELTO) tablet 20 mg (20 mg Oral Given 05/27/21 1430)    ED Course/ Medical Decision Making/ A&P                           Medical Decision Making Amount and/or Complexity of Data Reviewed Labs: ordered.  Risk Prescription drug management.   This patient presents to the ED for concern of palpitations, tachycardia, this involves an extensive number of treatment options, and is a complaint that carries with it a high risk of complications and morbidity.  The differential diagnosis includes atrial fibrillation with RVR, arrhythmia, PE, electrolyte unreality, ACS, dehydration   Additional history obtained: -Additional history obtained from wife at bedside -External records from outside source obtained and reviewed including: Chart review including previous notes, labs, imaging, consultation notes   Lab Tests: -I ordered, reviewed, and interpreted labs.  The pertinent results include:  Baseline blood work including normal magnesium and troponin, TSH is pending   EKG -Atrial fibrillation with RVR   Medicines ordered and prescription drug management: -I ordered medication including IV Cardizem for atrial fibrillation with RVR -Reevaluation of the patient after these medicines showed that the patient improved -I have reviewed the patients home medicines and have made adjustments as needed   Consultations Obtained: I requested consultation with the atrial fibrillation clinic provider,  and discussed lab and imaging findings as well as pertinent plan - they recommend: Adding Cardizem to his outpatient regimen and follow-up this Thursday in the office   ED Course: 76 year old male presents emergency department with palpitations/racing heartbeat that started this morning when he was being evaluated for colonoscopy.  He took an extra dose of his metoprolol without any improvement.  On arrival patient has atrial fibrillation with RVR, blood pressure is stable. Patient looks well. Blood work is normal, troponin is negative. No signs of ACS or heart failure.  Doubt PE and infection. After dose of cardizem patient is rate controlled.  Consulted with the atrial fibrillation clinic and they have given his recommendations for medication adjustment.  Patient has remained within normal rates and stable blood pressure.  Continues to be asymptomatic and well-appearing.  Plan for outpatient follow-up with atrial fibrillation clinic in 2 days.   Critical Interventions: IV antiarrhythmics   Cardiac Monitoring: The patient was maintained on a cardiac monitor.  I personally viewed and interpreted the cardiac monitored which showed an underlying rhythm of: Atrial fibrillation   Reevaluation: After the interventions noted above, I reevaluated the patient and found that they have :improved   Dispostion: Patient at this time appears safe and stable for discharge and close outpatient follow  up. Discharge plan and strict return to ED precautions discussed, patient verbalizes understanding and agreement.        Final Clinical Impression(s) / ED Diagnoses Final diagnoses:  Atrial fibrillation with RVR ([place])    Rx / DC Orders ED Discharge Orders          Ordered    diltiazem (CARDIZEM SR) 120 MG 12 hr capsule  Daily        05/27/21 1452              Shaunee Mulkern, Alvin Critchley, DO 05/27/21 1525

## 2021-05-27 NOTE — Progress Notes (Signed)
Patient admitted to recovery room.  Dr Ardis Hughs to come to speak to patient.  He does have rapid a fib at this time.  Josh Monday, CRNA obtained a strip.  Wife at bedside and requests that CRNA give the patient medication.   RN went to get the CRNA to discuss.  Verbal order from DR. Ardis Hughs to allow wife to give patient his metoprolol 25mg  po.   1140  Dr. Ardis Hughs speaking with the wife and patient.  Wife states that the pt has an appointment with the cardiologist in 3 days.  1155 Patient taken to [facility] for cardiac evaluation. Dr. Ardis Hughs called report into ED.  Wife to take pt per her car per her request.  Patient denies chest pain, shortness of breath.   Hr remained at 165/min.  See strip.

## 2021-05-27 NOTE — Progress Notes (Signed)
Hooked up patient in procedure room.  Pt in RVR.  Dr Ardis Hughs in room.  Pt feels fine but still try and give pt esmolol to decrease HR.  100mg  given total in 10mg  increments over approx. 15 minutes.  Little to no effect on HR and BP.  Procedure cancelled.

## 2021-05-27 NOTE — ED Notes (Signed)
Pt NAD, a/ox4. Pt verbalizes understanding of all DC and f/u instructions. All questions answered. Pt walks with steady gait to lobby at DC.  ? ?

## 2021-05-29 ENCOUNTER — Telehealth: Payer: Self-pay

## 2021-05-29 NOTE — Telephone Encounter (Signed)
Follow up call placed, VM obtained and message left. ?SChaplin, RN,BSN ? ?

## 2021-05-30 ENCOUNTER — Ambulatory Visit (HOSPITAL_COMMUNITY)
Admission: RE | Admit: 2021-05-30 | Discharge: 2021-05-30 | Disposition: A | Discharge status: Home / Self Care | Payer: Medicare HMO | Source: Ambulatory Visit | Attending: Nurse Practitioner | Admitting: Nurse Practitioner

## 2021-05-30 ENCOUNTER — Other Ambulatory Visit: Payer: Self-pay

## 2021-05-30 ENCOUNTER — Encounter (HOSPITAL_COMMUNITY): Payer: Self-pay | Admitting: Nurse Practitioner

## 2021-05-30 VITALS — BP 156/84 | HR 74 | Ht 69.0 in | Wt 185.4 lb

## 2021-05-30 DIAGNOSIS — Z7901 Long term (current) use of anticoagulants: Secondary | ICD-10-CM | POA: Diagnosis not present

## 2021-05-30 DIAGNOSIS — D6869 Other thrombophilia: Secondary | ICD-10-CM

## 2021-05-30 DIAGNOSIS — Z7982 Long term (current) use of aspirin: Secondary | ICD-10-CM | POA: Insufficient documentation

## 2021-05-30 DIAGNOSIS — I4892 Unspecified atrial flutter: Secondary | ICD-10-CM | POA: Diagnosis not present

## 2021-05-30 DIAGNOSIS — R7303 Prediabetes: Secondary | ICD-10-CM | POA: Diagnosis not present

## 2021-05-30 DIAGNOSIS — I10 Essential (primary) hypertension: Secondary | ICD-10-CM | POA: Diagnosis not present

## 2021-05-30 DIAGNOSIS — I480 Paroxysmal atrial fibrillation: Secondary | ICD-10-CM | POA: Diagnosis not present

## 2021-05-30 DIAGNOSIS — I471 Supraventricular tachycardia: Secondary | ICD-10-CM | POA: Insufficient documentation

## 2021-05-30 DIAGNOSIS — Z79899 Other long term (current) drug therapy: Secondary | ICD-10-CM | POA: Insufficient documentation

## 2021-05-30 DIAGNOSIS — I491 Atrial premature depolarization: Secondary | ICD-10-CM | POA: Diagnosis not present

## 2021-05-30 MED ORDER — METFORMIN HCL 500 MG PO TABS: 500.0000 mg | ORAL_TABLET | Freq: Every day | ORAL | Status: DC

## 2021-05-30 MED ORDER — DILTIAZEM HCL ER 120 MG PO CP12: 120.0000 mg | ORAL_CAPSULE | Freq: Every day | ORAL | 6 refills | Status: DC

## 2021-05-30 NOTE — Progress Notes (Signed)
Primary Care Physician: Jose Peng, NP Referring Physician: same GI: Dr. Kashden Evans is a 76 y.o. male with a h/o of paroxysmal afib in the afib clinic for evaluation. He wore a monitor in October that showed 3% afib burden as well SVT. He was placed on ASA and referred to afib clinic.  His EKG today shows atrial flutter with variable block at 109 bpm. He is unaware. He has had episodes for years of having heart pain associated with racing, bilateral arm pain, pain at base of head and then a warm flush usually signals that it is breaking. At one time, he was having 5-6 episodes  a month. Now 1-2 a month. He takes prn metoprolol tartrate with episodes. He has a CHA2DS2VASc score of 2. No tobacco, caffeine or alcohol. Denies snoring.   Pt has had a recent positive cologuard and is pending colonoscopy 05/27/21. He has not noted any blood in stool.   F/u in afib clinic, 05/30/21. Unfortunately, he presented for colonoscopy 2/6 and was in afib with RVR. He was sent to the ER for further treatment. He was given diltiazem and sent home with rx for 120 mg daily along with his metoprolol. He was in rate controlled afib at time of d/c from  ER but is in SR today. He feels he flipped on his own later that day, he could not be cardioverted in the ER as he had been off his xarelto for 2 days for colonoscopy. He has been holding his lisinopril since Cardizem was added. BP elevated here but encouraged to follow at home and if BP is still running over 403 systolic would encourage him to  take daily.   Today, he denies symptoms of palpitations, chest pain, shortness of breath, orthopnea, PND, lower extremity edema, dizziness, presyncope, syncope, or neurologic sequela. The patient is tolerating medications without difficulties and is otherwise without complaint today.   Past Medical History:  Diagnosis Date   Adult acne    Cancer ([place]) 07/2017   skin cancer   Gout    Hypertension    Thyroid  disease    Past Surgical History:  Procedure Laterality Date   HERNIA REPAIR     SHOULDER SURGERY     TONSILLECTOMY     UPPER GASTROINTESTINAL ENDOSCOPY      Current Outpatient Medications  Medication Sig Dispense Refill   acetaminophen (TYLENOL) 500 MG tablet Take 1,000 mg by mouth every 6 (six) hours as needed for mild pain.     allopurinol (ZYLOPRIM) 300 MG tablet TAKE 1 TABLET BY MOUTH EVERY DAY (Patient taking differently: Take 300 mg by mouth daily.) 90 tablet 3   amphetamine-dextroamphetamine (ADDERALL) 10 MG tablet Take 1 tablet (10 mg total) by mouth daily. 30 tablet 0   amphetamine-dextroamphetamine (ADDERALL) 10 MG tablet Take 1 tablet (10 mg total) by mouth daily. 30 tablet 0   diltiazem (CARDIZEM SR) 120 MG 12 hr capsule Take 1 capsule (120 mg total) by mouth daily. 30 capsule 0   finasteride (PROSCAR) 5 MG tablet Take 1 tablet (5 mg total) by mouth daily. 90 tablet 3   fluoruracil (CARAC) 0.5 % cream Apply 1 application topically daily as needed.     levothyroxine (SYNTHROID) 175 MCG tablet TAKE 1 TABLET BY MOUTH EVERY DAY (Patient taking differently: Take 175 mcg by mouth daily before breakfast.) 90 tablet 1   meloxicam (MOBIC) 7.5 MG tablet Take 1 tablet by mouth daily as needed.  2  metoprolol succinate (TOPROL XL) 25 MG 24 hr tablet Take 1 tablet (25 mg total) by mouth daily. 30 tablet 3   podofilox (CONDYLOX) 0.5 % external solution Apply topically 2 (two) times daily. Twice a day for 3 days and then stop for four days. Can be repeated 4 times (Patient taking differently: Apply 1 application topically 2 (two) times daily as needed (skin irritation). Twice a day for 3 days and then stop for four days. Can be repeated 4 times) 3.5 mL 1   rivaroxaban (XARELTO) 20 MG TABS tablet Take 1 tablet (20 mg total) by mouth daily with supper. 30 tablet 3   traZODone (DESYREL) 50 MG tablet TAKE 1 TABLET BY MOUTH AT BEDTIME AS NEEDED FOR SLEEP. (Patient taking differently: Take 50 mg by  mouth at bedtime as needed for sleep.) 90 tablet 1   triamcinolone cream (KENALOG) 0.1 % Apply 1 application topically 2 (two) times daily. 30 g 0   triamterene-hydrochlorothiazide (MAXZIDE) 75-50 MG tablet Take 1 tablet by mouth daily. 90 tablet 3   lisinopril (ZESTRIL) 5 MG tablet Take 1 tablet (5 mg total) by mouth daily. (Patient not taking: Reported on 05/30/2021) 90 tablet 3   metFORMIN (GLUCOPHAGE) 500 MG tablet Take 1 tablet (500 mg total) by mouth daily with breakfast.     Current Facility-Administered Medications  Medication Dose Route Frequency Provider Last Rate Last Admin   0.9 %  sodium chloride infusion  500 mL Intravenous Once Milus Banister, MD        No Known Allergies  Social History   Socioeconomic History   Marital status: Married    Spouse name: Not on file   Number of children: Not on file   Years of education: Not on file   Highest education level: Not on file  Occupational History   Not on file  Tobacco Use   Smoking status: Former    Packs/day: 0.50    Years: 30.00    Pack years: 15.00    Types: Cigarettes    Quit date: 09/07/2005    Years since quitting: 15.7   Smokeless tobacco: Never  Vaping Use   Vaping Use: Never used  Substance and Sexual Activity   Alcohol use: Yes    Comment: FEW SHOTS A WEEK   Drug use: No   Sexual activity: Not on file  Other Topics Concern   Not on file  Social History Narrative   Works for [employer]    Married   m   Social Determinants of Radio broadcast assistant Strain: Low Risk    Difficulty of Paying Living Expenses: Not hard at all  Food Insecurity: No Food Insecurity   Worried About Charity fundraiser in the Last Year: Never true   Arboriculturist in the Last Year: Never true  Transportation Needs: No Transportation Needs   Lack of Transportation (Medical): No   Lack of Transportation (Non-Medical): No  Physical Activity: Insufficiently Active   Days of Exercise per Week: 3 days    Minutes of Exercise per Session: 40 min  Stress: No Stress Concern Present   Feeling of Stress : Not at all  Social Connections: Moderately Isolated   Frequency of Communication with Friends and Family: Twice a week   Frequency of Social Gatherings with Friends and Family: Twice a week   Attends Religious Services: Never   Marine scientist or Organizations: No   Attends Archivist Meetings: Never  Marital Status: Married  Human resources officer Violence: Not At Risk   Fear of Current or Ex-Partner: No   Emotionally Abused: No   Physically Abused: No   Sexually Abused: No    Family History  Problem Relation Age of Onset   Hypertension Other    Colon cancer Neg Hx    Rectal cancer Neg Hx    Stomach cancer Neg Hx    Esophageal cancer Neg Hx     ROS- All systems are reviewed and negative except as per the HPI above  Physical Exam: Vitals:   05/30/21 1029  BP: (!) 156/84  Pulse: 74  Weight: 84.1 kg  Height: 5\' 9"  (1.753 m)   Wt Readings from Last 3 Encounters:  05/30/21 84.1 kg  05/27/21 80.3 kg  05/27/21 85.7 kg    Labs: Lab Results  Component Value Date   NA 141 05/27/2021   K 3.9 05/27/2021   CL 101 05/27/2021   CO2 26 05/27/2021   GLUCOSE 135 (H) 05/27/2021   BUN 19 05/27/2021   CREATININE 0.92 05/27/2021   CALCIUM 9.8 05/27/2021   MG 1.7 05/27/2021   No results found for: INR Lab Results  Component Value Date   CHOL 155 02/28/2021   HDL 39.20 02/28/2021   LDLCALC 88 02/28/2021   TRIG 138.0 02/28/2021     GEN- The patient is well appearing, alert and oriented x 3 today.   Head- normocephalic, atraumatic Eyes-  Sclera clear, conjunctiva pink Ears- hearing intact Oropharynx- clear Neck- supple, no JVP Lymph- no cervical lymphadenopathy Lungs- Clear to ausculation bilaterally, normal work of breathing Heart- Regular rate and rhythm, no murmurs, rubs or gallops, PMI not laterally displaced GI- soft, NT, ND, + BS Extremities- no  clubbing, cyanosis, or edema MS- no significant deformity or atrophy Skin- no rash or lesion Psych- euthymic mood, full affect Neuro- strength and sensation are intact  EKG sinus rhythm at 74 bpm, pr int 186 ms, qrs int 88 ms, qtc 430 ms   Epic records reviewed   Assessment and Plan:  1. Paroxysmal afib/flutter  Present at time of colonoscopy  with v rates in the 160's so procedure was cancelled He was seen in the ER and Cardizem drip was used and then transitioned to po Diltiazem  120 mg daily  was added  He returned to SR later that  day  Continue metoprolol succinate 25 mg daily   He can try colonoscopy again but make sure to stay on metoprolol and  diltiazem peri procedurally  If  ERAF, will need to discuss antiarrythmic drugs   2. CHA2DS2VASc  score of 2 Continue xarelto 20 mg daily   He will need to stop 2 days before repeat  coloscopy    3. HTN  Elevated today at 156/84 He has been holding lisinopril since diltiazem was added  I asked him to check BP 2x a day for one week and if BP is consistently over 532 systolic, add lisinopril back   F/u  in 3 months   Jose Evans, [facility] [address] [place], [place] [address] [phone]

## 2021-05-30 NOTE — Addendum Note (Signed)
Encounter addended by: Enid Derry, CMA on: 05/30/2021 4:41 PM  Actions taken: Order Reconciliation Section accessed, Pharmacy for encounter modified, Order list changed

## 2021-05-31 ENCOUNTER — Telehealth: Payer: Self-pay

## 2021-05-31 NOTE — Telephone Encounter (Signed)
I spoke with the pt and he tells me that he does not wish to schedule at this time.  He wants to speak with his wife and call back when he is ready.  I will put in a 2 month recall so he is not missed.

## 2021-05-31 NOTE — Telephone Encounter (Signed)
-----   Message from Milus Banister, MD sent at 05/31/2021  7:16 AM EST ----- Hey, thank you for your note.   Lisvet Rasheed, Can you contact him about rescheduling the colonoscopy.  Cardiology has already [facility] a 2-day hold again for his blood thinner.  Make it about 4 to 6 weeks from now to make sure that his rate has been controlled for a while before another attempt at colonoscopy.  Thanks   ----- Message ----- From: [name], NP Sent: 05/30/2021  11:35 AM EST To: Milus Banister, MD, [name], NP

## 2021-06-18 ENCOUNTER — Encounter: Payer: Self-pay | Admitting: Adult Health

## 2021-06-18 ENCOUNTER — Ambulatory Visit (INDEPENDENT_AMBULATORY_CARE_PROVIDER_SITE_OTHER): Payer: Medicare HMO | Admitting: Adult Health

## 2021-06-18 VITALS — BP 140/80 | HR 62 | Temp 98.7°F | Ht 69.0 in | Wt 187.0 lb

## 2021-06-18 DIAGNOSIS — M159 Polyosteoarthritis, unspecified: Secondary | ICD-10-CM | POA: Diagnosis not present

## 2021-06-18 MED ORDER — TRAMADOL HCL 50 MG PO TABS: 50.0000 mg | ORAL_TABLET | Freq: Three times a day (TID) | ORAL | 0 refills | Status: AC | PRN

## 2021-06-18 NOTE — Progress Notes (Signed)
Subjective:    Patient ID: Gor Vestal, male    DOB: Oct 09, 1945, 76 y.o.   MRN: [id number]  HPI 76 year old male who is being evaluated today for a chronic issue of multiple joint pain due to osteoarthritis.  He has been diagnosed with atrial fibrillation and started on Xarelto.  He can no longer take Mobic which is worked well for him in the past.  He reports that on bad days Tylenol does not touch the pain that he experiences in his joints.  And other days when the pain is so intense Tylenol does work.  He is wondering if there is anything that he can take as needed for when he has really bad days with his chronic arthritic pain   Review of Systems See HPI   Past Medical History:  Diagnosis Date   Adult acne    Cancer ([place]) 07/2017   skin cancer   Gout    Hypertension    Thyroid disease     Social History   Socioeconomic History   Marital status: Married    Spouse name: Not on file   Number of children: Not on file   Years of education: Not on file   Highest education level: Not on file  Occupational History   Not on file  Tobacco Use   Smoking status: Former    Packs/day: 0.50    Years: 30.00    Pack years: 15.00    Types: Cigarettes    Quit date: 09/07/2005    Years since quitting: 15.7   Smokeless tobacco: Never  Vaping Use   Vaping Use: Never used  Substance and Sexual Activity   Alcohol use: Yes    Comment: FEW SHOTS A WEEK   Drug use: No   Sexual activity: Not on file  Other Topics Concern   Not on file  Social History Narrative   Works for [employer]    Married   m   Social Determinants of Radio broadcast assistant Strain: Low Risk    Difficulty of Paying Living Expenses: Not hard at all  Food Insecurity: No Food Insecurity   Worried About Charity fundraiser in the Last Year: Never true   Arboriculturist in the Last Year: Never true  Transportation Needs: No Transportation Needs   Lack of Transportation (Medical): No   Lack of  Transportation (Non-Medical): No  Physical Activity: Insufficiently Active   Days of Exercise per Week: 3 days   Minutes of Exercise per Session: 40 min  Stress: No Stress Concern Present   Feeling of Stress : Not at all  Social Connections: Moderately Isolated   Frequency of Communication with Friends and Family: Twice a week   Frequency of Social Gatherings with Friends and Family: Twice a week   Attends Religious Services: Never   Marine scientist or Organizations: No   Attends Music therapist: Never   Marital Status: Married  Human resources officer Violence: Not At Risk   Fear of Current or Ex-Partner: No   Emotionally Abused: No   Physically Abused: No   Sexually Abused: No    Past Surgical History:  Procedure Laterality Date   HERNIA REPAIR     SHOULDER SURGERY     TONSILLECTOMY     UPPER GASTROINTESTINAL ENDOSCOPY      Family History  Problem Relation Age of Onset   Hypertension Other    Colon cancer Neg Hx    Rectal  cancer Neg Hx    Stomach cancer Neg Hx    Esophageal cancer Neg Hx     No Known Allergies  Current Outpatient Medications on File Prior to Visit  Medication Sig Dispense Refill   acetaminophen (TYLENOL) 500 MG tablet Take 1,000 mg by mouth every 6 (six) hours as needed for mild pain.     allopurinol (ZYLOPRIM) 300 MG tablet TAKE 1 TABLET BY MOUTH EVERY DAY (Patient taking differently: Take 300 mg by mouth daily.) 90 tablet 3   amphetamine-dextroamphetamine (ADDERALL) 10 MG tablet Take 1 tablet (10 mg total) by mouth daily. 30 tablet 0   diltiazem (CARDIZEM SR) 120 MG 12 hr capsule Take 1 capsule (120 mg total) by mouth daily. 30 capsule 6   finasteride (PROSCAR) 5 MG tablet Take 1 tablet (5 mg total) by mouth daily. 90 tablet 3   fluoruracil (CARAC) 0.5 % cream Apply 1 application topically daily as needed.     levothyroxine (SYNTHROID) 175 MCG tablet TAKE 1 TABLET BY MOUTH EVERY DAY (Patient taking differently: Take 175 mcg by mouth  daily before breakfast.) 90 tablet 1   lisinopril (ZESTRIL) 5 MG tablet Take 1 tablet (5 mg total) by mouth daily. 90 tablet 3   meloxicam (MOBIC) 7.5 MG tablet Take 1 tablet by mouth daily as needed.  2   metFORMIN (GLUCOPHAGE) 500 MG tablet Take 1 tablet (500 mg total) by mouth daily with breakfast.     metoprolol succinate (TOPROL XL) 25 MG 24 hr tablet Take 1 tablet (25 mg total) by mouth daily. 30 tablet 3   podofilox (CONDYLOX) 0.5 % external solution Apply topically 2 (two) times daily. Twice a day for 3 days and then stop for four days. Can be repeated 4 times (Patient taking differently: Apply 1 application topically 2 (two) times daily as needed (skin irritation). Twice a day for 3 days and then stop for four days. Can be repeated 4 times) 3.5 mL 1   rivaroxaban (XARELTO) 20 MG TABS tablet Take 1 tablet (20 mg total) by mouth daily with supper. 30 tablet 3   traZODone (DESYREL) 50 MG tablet TAKE 1 TABLET BY MOUTH AT BEDTIME AS NEEDED FOR SLEEP. (Patient taking differently: Take 50 mg by mouth at bedtime as needed for sleep.) 90 tablet 1   triamcinolone cream (KENALOG) 0.1 % Apply 1 application topically 2 (two) times daily. 30 g 0   triamterene-hydrochlorothiazide (MAXZIDE) 75-50 MG tablet Take 1 tablet by mouth daily. 90 tablet 3   amphetamine-dextroamphetamine (ADDERALL) 10 MG tablet Take 1 tablet (10 mg total) by mouth daily. 30 tablet 0   Current Facility-Administered Medications on File Prior to Visit  Medication Dose Route Frequency Provider Last Rate Last Admin   0.9 %  sodium chloride infusion  500 mL Intravenous Once Milus Banister, MD        BP 140/80    Pulse 62    Temp 98.7 F (37.1 C) (Oral)    Ht 5\' 9"  (1.753 m)    Wt 187 lb (84.8 kg)    SpO2 98%    BMI 27.62 kg/m       Objective:   Physical Exam Vitals and nursing note reviewed.  Constitutional:      Appearance: Normal appearance.  Cardiovascular:     Rate and Rhythm: Normal rate and regular rhythm.      Pulses: Normal pulses.     Heart sounds: Normal heart sounds.  Pulmonary:     Breath sounds: Normal  breath sounds.  Musculoskeletal:        General: Normal range of motion.  Skin:    General: Skin is warm.     Capillary Refill: Capillary refill takes less than 2 seconds.  Neurological:     General: No focal deficit present.     Mental Status: He is oriented to person, place, and time.  Psychiatric:        Mood and Affect: Mood normal.        Behavior: Behavior normal.        Thought Content: Thought content normal.        Judgment: Judgment normal.      Assessment & Plan:  1. Primary osteoarthritis involving multiple joints - traMADol (ULTRAM) 50 MG tablet; Take 1 tablet (50 mg total) by mouth every 8 (eight) hours as needed.  Dispense: 30 tablet; Refill: 0  [name], NP

## 2021-06-19 ENCOUNTER — Encounter: Payer: Self-pay | Admitting: Gastroenterology

## 2021-06-19 ENCOUNTER — Other Ambulatory Visit: Payer: Self-pay | Admitting: Adult Health

## 2021-06-19 DIAGNOSIS — R7303 Prediabetes: Secondary | ICD-10-CM

## 2021-07-07 ENCOUNTER — Other Ambulatory Visit (HOSPITAL_COMMUNITY): Payer: Self-pay | Admitting: Nurse Practitioner

## 2021-07-15 ENCOUNTER — Other Ambulatory Visit: Payer: Self-pay | Admitting: Adult Health

## 2021-07-15 DIAGNOSIS — E039 Hypothyroidism, unspecified: Secondary | ICD-10-CM

## 2021-07-22 ENCOUNTER — Encounter: Payer: Self-pay | Admitting: Adult Health

## 2021-07-23 ENCOUNTER — Other Ambulatory Visit: Payer: Self-pay | Admitting: Adult Health

## 2021-07-23 MED ORDER — TRAMADOL HCL 50 MG PO TABS: 50.0000 mg | ORAL_TABLET | Freq: Every day | ORAL | 2 refills | Status: AC | PRN

## 2021-08-02 ENCOUNTER — Ambulatory Visit (INDEPENDENT_AMBULATORY_CARE_PROVIDER_SITE_OTHER): Payer: Medicare HMO

## 2021-08-02 ENCOUNTER — Ambulatory Visit: Payer: Self-pay

## 2021-08-02 ENCOUNTER — Encounter: Payer: Self-pay | Admitting: Family Medicine

## 2021-08-02 ENCOUNTER — Ambulatory Visit (INDEPENDENT_AMBULATORY_CARE_PROVIDER_SITE_OTHER): Payer: Medicare HMO | Admitting: Family Medicine

## 2021-08-02 ENCOUNTER — Telehealth: Payer: Self-pay | Admitting: Pharmacist

## 2021-08-02 VITALS — BP 120/72 | HR 67 | Ht 69.0 in | Wt 183.6 lb

## 2021-08-02 DIAGNOSIS — M25512 Pain in left shoulder: Secondary | ICD-10-CM

## 2021-08-02 DIAGNOSIS — S46012A Strain of muscle(s) and tendon(s) of the rotator cuff of left shoulder, initial encounter: Secondary | ICD-10-CM | POA: Diagnosis not present

## 2021-08-02 DIAGNOSIS — M791 Myalgia, unspecified site: Secondary | ICD-10-CM

## 2021-08-02 MED ORDER — LORAZEPAM 0.5 MG PO TABS: ORAL_TABLET | ORAL | 0 refills | Status: DC

## 2021-08-02 NOTE — Patient Instructions (Addendum)
Good to see you today. ? ?I've ordered an MRI of your L shoulder.  That facility will call you to schedule but please let us know if you haven't heard from them in one week regarding scheduling. ? ?I've prescribed you Ativan for your MRI. ? ?Follow-up: after MRI to review results or may refer directly to Orthopedics depending on MRI results ?

## 2021-08-02 NOTE — Progress Notes (Signed)
? ?I, Jose Evans, LAT, ATC, am serving as scribe for Dr. Lynne Leader. ? ?Jose Evans is a 76 y.o. male who presents to [place] [facility] today for L shoulder pain and bruising.  He was last seen by Dr. Georgina Snell on 03/28/20 for f/u of chronic L shoulder pain after having a L subacromial steroid injection on 02/08/20.  Today, pt reports L shoulder pain and upper arm bruising for approximately one week-10 days.  He locates his pain to .  Pt reports in October 2022 he fell down some stairs in his house and "split" his L upper arm open.  He went to the [place] ED at that time.  Since that injury, he's noted at least 2 episodes of L upper arm pain and bruising. ? ?He also mentions that since he got started on Xarelto he has been having a lot of muscle soreness. ? ?Radiating pain: yes into L arm to the L forearm ?Shoulder mechanical symptoms: yes ?Aggravating factors: L arm AROM of any type ?Treatments tried: Tylenol; Tramadol ? ?Diagnostic testing: L shoulder XR- 07/19/20, 02/08/20 ? ?Pertinent review of systems: No fevers or chills ? ?Relevant historical information: Atrial fibrillation on Xarelto ? ? ?Exam:  ?BP 120/72 (BP Location: Right Arm, Patient Position: Sitting, Cuff Size: Normal)   Pulse 67   Ht '5\' 9"'$  (1.753 m)   Wt 183 lb 9.6 oz (83.3 kg)   SpO2 96%   BMI 27.11 kg/m?  ?General: Well Developed, well nourished, and in no acute distress.  ? ?MSK: Left arm: Bruising present anterior upper arm.  Decreased deltoid muscle bulk. ?Range of motion: Abduction active range of motion to about 80 degrees.  Passive range of motion is full but painful. ?External rotation full.  Functional internal rotation lumbar spine. ?Strength: Abduction 3+/5. ?External rotation 4/5.  Internal rotation 4+/5. ?Positive Hawkins and Neer's test. ?Mildly positive Yergason's and speeds test. ?Pulses capillary refill and sensation are intact distally. ? ? ? ? ?Lab and Radiology Results ? ?X-ray images left  shoulder obtained today personally and independently interpreted ?No acute fractures are visible. ?Humeral head is elevated compared to the glenoid indicating likely chronic rotator cuff tear. ?No severe glenohumeral DJD present. ?Mild AC DJD present. ?Await formal radiology review ? ?Diagnostic Limited MSK Ultrasound of: Left shoulder ?Biceps tendon difficult to visualize but is eventually present Saraiva hypoechoic fluid.  This could indicate a partial biceps tendon tear. ?Subscapularis tendon is thin appearing indicating possible tear however I can visualize at least parts of the subscapularis tendon. ?Supraspinatus tendon is impossible or very difficult to visualize indicating very likely full-thickness retracted supraspinatus tear. ?Infraspinatus tendon is also thin but parts of it are visible indicating that it is unlikely that he has a full-thickness retracted infraspinatus tendon tear. ?Impression: Likely full-thickness retracted supraspinatus tear.  Question partial rotator cuff tears of the subscapularis and infraspinatus tendon. ? ? ? ? ? ?Assessment and Plan: ?76 y.o. male with left shoulder pain and dysfunction and weakness after a fall occurring October 2022.  I think it is very likely that he suffered a full-thickness rotator cuff tear during that injury.  That was about 6 months ago.  He has been pretty dysfunctional at this point with a lot of weakness and pain.  If he does have a full-thickness rotator cuff tear it may or may not be repairable at this point.  Ultimately the amount of fat atrophy visible on MRI will be determined at of however repairable a  rotator cuff tear is.  He is willing to proceed with surgery if possible.  Plan for MRI of the left shoulder to evaluate for rotator cuff tear and fatty atrophy and potential for surgery. ?If surgery indicated refer directly to orthopedic surgery.  If surgery not indicated will refer to physical therapy. ? ? ?Muscle soreness: This is not a common  side effect of Xarelto.  Although he does not have other medications that are also likely to cause muscle soreness or myalgia.  I recommend that he contact his cardiology [name] clinic and see if he can switch to Eliquis.  It is worthwhile trying a different anticoagulant.  He may try other medications as well stopping or starting or switching to see if we can pinpoint if any of his medicines may be causing his myalgia.  He has a follow-up appointment scheduled with Roderic Palau nurse practitioner on May 10.  I will send a message to her discussing this. ? ?PDMP not reviewed this encounter. ?Orders Placed This Encounter  ?Procedures  ? DG Shoulder Left  ?  Standing Status:   Future  ?  Number of Occurrences:   1  ?  Standing Expiration Date:   09/01/2021  ?  Order Specific Question:   Reason for Exam (SYMPTOM  OR DIAGNOSIS REQUIRED)  ?  Answer:   L shoulder pain  ?  Order Specific Question:   Preferred imaging location?  ?  Answer:   [name]  ? [place] LIMITED JOINT SPACE STRUCTURES UP LEFT(NO LINKED CHARGES)  ?  Order Specific Question:   Reason for Exam (SYMPTOM  OR DIAGNOSIS REQUIRED)  ?  Answer:   L shoulder pain  ?  Order Specific Question:   Preferred imaging location?  ?  Answer:   [place]  ? MR SHOULDER LEFT WO CONTRAST  ?  Standing Status:   Future  ?  Standing Expiration Date:   08/03/2022  ?  Order Specific Question:   What is the patient's sedation requirement?  ?  Answer:   Anti-anxiety  ?  Order Specific Question:   Does the patient have a pacemaker or implanted devices?  ?  Answer:   No  ?  Order Specific Question:   Preferred imaging location?  ?  Answer:   Product/process development scientist (table limit-350lbs)  ? ?Meds ordered this encounter  ?Medications  ? LORazepam (ATIVAN) 0.5 MG tablet  ?  Sig: 1-2 tabs 30 - 60 min prior to MRI. Do not drive with this medicine.  ?  Dispense:  4 tablet  ?  Refill:  0  ? ? ? ?Discussed warning signs or symptoms. Please see discharge  instructions. Patient expresses understanding. ? ? ?The above documentation has been reviewed and is accurate and complete Lynne Leader, M.D. ? ? ?

## 2021-08-02 NOTE — Chronic Care Management (AMB) (Signed)
? ? ?  Chronic Care Management ?Pharmacy Assistant  ? ?Name: Jose Evans  MRN: [id number] DOB: Sep 07, 1945 ? ?Reason for Encounter: Schedule follow up (jul or aug) ? ?Unable to reach patient after several attempts and multiple attempts to reach patient since Nov. 2022 ?  ? ?Gennie Alma CMA  ?Clinical Pharmacist Assistant ?[phone] ? ?

## 2021-08-05 ENCOUNTER — Ambulatory Visit: Payer: Medicare HMO | Admitting: Family Medicine

## 2021-08-05 NOTE — Progress Notes (Signed)
Left shoulder x-ray shows evidence of chronic rotator cuff tear.

## 2021-08-06 ENCOUNTER — Telehealth: Payer: Self-pay | Admitting: Family Medicine

## 2021-08-06 NOTE — Telephone Encounter (Signed)
I called Jose Evans. After discussion plan for MRI at [facility] with Ativan.  ?

## 2021-08-08 ENCOUNTER — Other Ambulatory Visit: Payer: Self-pay | Admitting: Adult Health

## 2021-08-08 DIAGNOSIS — G479 Sleep disorder, unspecified: Secondary | ICD-10-CM

## 2021-08-08 DIAGNOSIS — G8929 Other chronic pain: Secondary | ICD-10-CM

## 2021-08-14 ENCOUNTER — Encounter: Payer: Self-pay | Admitting: Adult Health

## 2021-08-14 NOTE — Telephone Encounter (Signed)
Ok for refill? 

## 2021-08-15 ENCOUNTER — Other Ambulatory Visit: Payer: Self-pay | Admitting: Adult Health

## 2021-08-15 DIAGNOSIS — F988 Other specified behavioral and emotional disorders with onset usually occurring in childhood and adolescence: Secondary | ICD-10-CM

## 2021-08-15 MED ORDER — AMPHETAMINE-DEXTROAMPHETAMINE 10 MG PO TABS: 10.0000 mg | ORAL_TABLET | Freq: Every day | ORAL | 0 refills | Status: DC

## 2021-08-17 ENCOUNTER — Other Ambulatory Visit: Payer: Medicare HMO

## 2021-08-22 ENCOUNTER — Other Ambulatory Visit (HOSPITAL_COMMUNITY): Payer: Self-pay | Admitting: Nurse Practitioner

## 2021-08-24 ENCOUNTER — Other Ambulatory Visit: Payer: Medicare HMO

## 2021-08-28 ENCOUNTER — Ambulatory Visit (HOSPITAL_COMMUNITY)
Admission: RE | Admit: 2021-08-28 | Discharge: 2021-08-28 | Disposition: A | Discharge status: Home / Self Care | Payer: Medicare HMO | Source: Ambulatory Visit | Attending: Nurse Practitioner | Admitting: Nurse Practitioner

## 2021-08-28 VITALS — BP 126/70 | HR 70 | Ht 69.0 in | Wt 182.6 lb

## 2021-08-28 DIAGNOSIS — D6869 Other thrombophilia: Secondary | ICD-10-CM

## 2021-08-28 DIAGNOSIS — I4892 Unspecified atrial flutter: Secondary | ICD-10-CM | POA: Diagnosis not present

## 2021-08-28 DIAGNOSIS — R7303 Prediabetes: Secondary | ICD-10-CM | POA: Diagnosis not present

## 2021-08-28 DIAGNOSIS — Z7901 Long term (current) use of anticoagulants: Secondary | ICD-10-CM | POA: Diagnosis not present

## 2021-08-28 DIAGNOSIS — I10 Essential (primary) hypertension: Secondary | ICD-10-CM | POA: Diagnosis not present

## 2021-08-28 DIAGNOSIS — I480 Paroxysmal atrial fibrillation: Secondary | ICD-10-CM | POA: Diagnosis not present

## 2021-08-28 DIAGNOSIS — M2550 Pain in unspecified joint: Secondary | ICD-10-CM | POA: Diagnosis not present

## 2021-08-28 MED ORDER — METFORMIN HCL 500 MG PO TABS: 500.0000 mg | ORAL_TABLET | Freq: Every day | ORAL | Status: DC

## 2021-08-28 MED ORDER — METOPROLOL SUCCINATE ER 50 MG PO TB24: 50.0000 mg | ORAL_TABLET | Freq: Every day | ORAL | 1 refills | Status: DC

## 2021-08-28 NOTE — Progress Notes (Signed)
? ?Primary Care Physician: [name], NP ?Referring Physician: same ?GI: Dr. Ardis Hughs  ? ? ?Jose Evans is a 76 y.o. male with a h/o of paroxysmal afib in the afib clinic for evaluation. He wore a monitor in October that showed 3% afib burden as well SVT. He was placed on ASA and referred to afib clinic.  His EKG today shows atrial flutter with variable block at 109 bpm. He is unaware. He has had episodes for years of having heart pain associated with racing, bilateral arm pain, pain at base of head and then a warm flush usually signals that it is breaking. At one time, he was having 5-6 episodes  a month. Now 1-2 a month. He takes prn metoprolol tartrate with episodes. He has a CHA2DS2VASc score of 2. No tobacco, caffeine or alcohol. Denies snoring.  ? ?Pt has had a recent positive cologuard and is pending colonoscopy 05/27/21. He has not noted any blood in stool.  ? ?F/u in afib clinic, 05/30/21. Unfortunately, he presented for colonoscopy 2/6 and was in afib with RVR. He was sent to the ER for further treatment. He was given diltiazem and sent home with rx for 120 mg daily along with his metoprolol. He was in rate controlled afib at time of d/c from  ER but is in SR today. He feels he flipped on his own later that day, he could not be cardioverted in the ER as he had been off his xarelto for 2 days for colonoscopy. He has been holding his lisinopril since Cardizem was added. BP elevated here but encouraged to follow at home and if BP is still running over 176 systolic would encourage him to  take daily.  ? ?F/u in afib clinic, 08/28/21 for 3 month f/u. He is in SR. He is c/o of arthralgia's that coincide with starting of diltiazem. He wakes up in the am, taking dilt in the am and develops aches by lunch with lack of energy and then feels better at bedtime. No afib to report.   ? ?Today, he denies symptoms of palpitations, chest pain, shortness of breath, orthopnea, PND, lower extremity edema, dizziness,  presyncope, syncope, or neurologic sequela. The patient is tolerating medications without difficulties and is otherwise without complaint today.  ? ?Past Medical History:  ?Diagnosis Date  ? Adult acne   ? Cancer ([place]) 07/2017  ? skin cancer  ? Gout   ? Hypertension   ? Thyroid disease   ? ?Past Surgical History:  ?Procedure Laterality Date  ? HERNIA REPAIR    ? SHOULDER SURGERY    ? TONSILLECTOMY    ? UPPER GASTROINTESTINAL ENDOSCOPY    ? ? ?Current Outpatient Medications  ?Medication Sig Dispense Refill  ? acetaminophen (TYLENOL) 500 MG tablet Take 1,000 mg by mouth every 6 (six) hours as needed for mild pain.    ? allopurinol (ZYLOPRIM) 300 MG tablet TAKE 1 TABLET BY MOUTH EVERY DAY (Patient taking differently: Take 300 mg by mouth daily.) 90 tablet 3  ? amphetamine-dextroamphetamine (ADDERALL) 10 MG tablet Take 1 tablet (10 mg total) by mouth daily. 30 tablet 0  ? amphetamine-dextroamphetamine (ADDERALL) 10 MG tablet Take 1 tablet (10 mg total) by mouth daily. 30 tablet 0  ? amphetamine-dextroamphetamine (ADDERALL) 10 MG tablet Take 1 tablet (10 mg total) by mouth daily with breakfast. 30 tablet 0  ? finasteride (PROSCAR) 5 MG tablet Take 1 tablet (5 mg total) by mouth daily. 90 tablet 3  ? fluoruracil (CARAC) 0.5 %  cream Apply 1 application topically daily as needed.    ? levothyroxine (SYNTHROID) 175 MCG tablet Take 1 tablet (175 mcg total) by mouth daily before breakfast. 90 tablet 1  ? lisinopril (ZESTRIL) 5 MG tablet Take 1 tablet (5 mg total) by mouth daily. 90 tablet 3  ? LORazepam (ATIVAN) 0.5 MG tablet 1-2 tabs 30 - 60 min prior to MRI. Do not drive with this medicine. 4 tablet 0  ? podofilox (CONDYLOX) 0.5 % external solution Apply topically 2 (two) times daily. Twice a day for 3 days and then stop for four days. Can be repeated 4 times (Patient taking differently: Apply 1 application. topically 2 (two) times daily as needed (skin irritation). Twice a day for 3 days and then stop for four days. Can be  repeated 4 times) 3.5 mL 1  ? traMADol (ULTRAM) 50 MG tablet Take 50 mg by mouth daily as needed.    ? traZODone (DESYREL) 50 MG tablet TAKE 1 TABLET BY MOUTH EVERY DAY AT BEDTIME AS NEEDED FOR SLEEP 90 tablet 1  ? triamcinolone cream (KENALOG) 0.1 % Apply 1 application topically 2 (two) times daily. 30 g 0  ? triamterene-hydrochlorothiazide (MAXZIDE) 75-50 MG tablet Take 1 tablet by mouth daily. 90 tablet 3  ? XARELTO 20 MG TABS tablet TAKE 1 TABLET BY MOUTH DAILY WITH SUPPER. 30 tablet 11  ? metFORMIN (GLUCOPHAGE) 500 MG tablet Take 1 tablet (500 mg total) by mouth daily with breakfast.    ? metoprolol succinate (TOPROL-XL) 50 MG 24 hr tablet Take 1 tablet (50 mg total) by mouth daily. 90 tablet 1  ? ?No current facility-administered medications for this encounter.  ? ? ?No Known Allergies ? ?Social History  ? ?Socioeconomic History  ? Marital status: Married  ?  Spouse name: Not on file  ? Number of children: Not on file  ? Years of education: Not on file  ? Highest education level: Not on file  ?Occupational History  ? Not on file  ?Tobacco Use  ? Smoking status: Former  ?  Packs/day: 0.50  ?  Years: 30.00  ?  Pack years: 15.00  ?  Types: Cigarettes  ?  Quit date: 09/07/2005  ?  Years since quitting: 15.9  ? Smokeless tobacco: Never  ?Vaping Use  ? Vaping Use: Never used  ?Substance and Sexual Activity  ? Alcohol use: Yes  ?  Comment: FEW SHOTS A WEEK  ? Drug use: No  ? Sexual activity: Not on file  ?Other Topics Concern  ? Not on file  ?Social History Narrative  ? Works for [employer]   ? Married  ? m  ? ?Social Determinants of Health  ? ?Financial Resource Strain: Low Risk   ? Difficulty of Paying Living Expenses: Not hard at all  ?Food Insecurity: No Food Insecurity  ? Worried About Charity fundraiser in the Last Year: Never true  ? Ran Out of Food in the Last Year: Never true  ?Transportation Needs: No Transportation Needs  ? Lack of Transportation (Medical): No  ? Lack of Transportation  (Non-Medical): No  ?Physical Activity: Insufficiently Active  ? Days of Exercise per Week: 3 days  ? Minutes of Exercise per Session: 40 min  ?Stress: No Stress Concern Present  ? Feeling of Stress : Not at all  ?Social Connections: Moderately Isolated  ? Frequency of Communication with Friends and Family: Twice a week  ? Frequency of Social Gatherings with Friends and Family: Twice  a week  ? Attends Religious Services: Never  ? Active Member of Clubs or Organizations: No  ? Attends Archivist Meetings: Never  ? Marital Status: Married  ?Intimate Partner Violence: Not At Risk  ? Fear of Current or Ex-Partner: No  ? Emotionally Abused: No  ? Physically Abused: No  ? Sexually Abused: No  ? ? ?Family History  ?Problem Relation Age of Onset  ? Hypertension Other   ? Colon cancer Neg Hx   ? Rectal cancer Neg Hx   ? Stomach cancer Neg Hx   ? Esophageal cancer Neg Hx   ? ? ?ROS- All systems are reviewed and negative except as per the HPI above ? ?Physical Exam: ?Vitals:  ? 08/28/21 1002  ?BP: 126/70  ?Pulse: 70  ?Weight: 82.8 kg  ?Height: '5\' 9"'$  (1.753 m)  ? ?Wt Readings from Last 3 Encounters:  ?08/28/21 82.8 kg  ?08/02/21 83.3 kg  ?06/18/21 84.8 kg  ? ? ?Labs: ?Lab Results  ?Component Value Date  ? NA 141 05/27/2021  ? K 3.9 05/27/2021  ? CL 101 05/27/2021  ? CO2 26 05/27/2021  ? GLUCOSE 135 (H) 05/27/2021  ? BUN 19 05/27/2021  ? CREATININE 0.92 05/27/2021  ? CALCIUM 9.8 05/27/2021  ? MG 1.7 05/27/2021  ? ?No results found for: INR ?Lab Results  ?Component Value Date  ? CHOL 155 02/28/2021  ? HDL 39.20 02/28/2021  ? [place] 88 02/28/2021  ? TRIG 138.0 02/28/2021  ? ? ? ?GEN- The patient is well appearing, alert and oriented x 3 today.   ?Head- normocephalic, atraumatic ?Eyes-  Sclera clear, conjunctiva pink ?Ears- hearing intact ?Oropharynx- clear ?Neck- supple, no JVP ?Lymph- no cervical lymphadenopathy ?Lungs- Clear to ausculation bilaterally, normal work of breathing ?Heart- Regular rate and rhythm, no  murmurs, rubs or gallops, PMI not laterally displaced ?GI- soft, NT, ND, + BS ?Extremities- no clubbing, cyanosis, or edema ?MS- no significant deformity or atrophy ?Skin- no rash or lesion ?Psych- euthymic mood, full affec

## 2021-08-28 NOTE — Patient Instructions (Signed)
Stop cardizem (diltiazem) ? ?Increase metoprolol to '50mg'$  once a day  ? ?Call if symptoms do not improve [phone] Marzetta Board) ?

## 2021-08-31 ENCOUNTER — Ambulatory Visit (INDEPENDENT_AMBULATORY_CARE_PROVIDER_SITE_OTHER): Payer: Medicare HMO

## 2021-08-31 DIAGNOSIS — S46012A Strain of muscle(s) and tendon(s) of the rotator cuff of left shoulder, initial encounter: Secondary | ICD-10-CM | POA: Diagnosis not present

## 2021-08-31 DIAGNOSIS — M75122 Complete rotator cuff tear or rupture of left shoulder, not specified as traumatic: Secondary | ICD-10-CM | POA: Diagnosis not present

## 2021-08-31 DIAGNOSIS — R531 Weakness: Secondary | ICD-10-CM | POA: Diagnosis not present

## 2021-08-31 DIAGNOSIS — S46112A Strain of muscle, fascia and tendon of long head of biceps, left arm, initial encounter: Secondary | ICD-10-CM | POA: Diagnosis not present

## 2021-08-31 DIAGNOSIS — S43005A Unspecified dislocation of left shoulder joint, initial encounter: Secondary | ICD-10-CM | POA: Diagnosis not present

## 2021-08-31 DIAGNOSIS — M25512 Pain in left shoulder: Secondary | ICD-10-CM

## 2021-09-03 ENCOUNTER — Encounter: Payer: Self-pay | Admitting: Family Medicine

## 2021-09-03 NOTE — Progress Notes (Signed)
MRI shows what we feared which is full-thickness rotator cuff tear of the supraspinatus and infraspinatus tendons.  Unfortunately the muscles have atrophied indicating that this is going to be hard or impossible to repair.   ?You also have arthritis present in the shoulder joint which is contributing to pain. ?I am not optimistic that surgery (unless we are talking about a total shoulder replacement) helping much.  Recommend return to clinic to go the results in full detail and discuss treatment plan and options.  Physical therapy is a good starting point as well as possible injection.  We can do an injection at the same visit.

## 2021-09-06 ENCOUNTER — Ambulatory Visit: Payer: Medicare HMO | Admitting: Family Medicine

## 2021-09-09 NOTE — Progress Notes (Unsigned)
I, [name], LAT, ATC, am serving as scribe for Dr. Lynne Leader.  Jose Evans is a 76 y.o. male who presents to [place] at [facility] today for f/u of L shoulder pain due to large full-thickness RC tear and L shoulder DJD and to review his L shoulder MRI results.  He was last seen by Dr. Georgina Snell on 08/02/21 and reported multiple bouts of L shoulder/upper arm pain and bruising after falling down some stairs at his house in Oct 2022.  He was referred for a shoulder MRI.  Today, pt reports L shoulder is feeling about the same.   Diagnostic testing: L shoulder MRI- 08/31/21; L shoulder XR- 08/02/21, 07/19/20, 02/08/20  Pertinent review of systems: No fevers or chills  Relevant historical information: Hypertension   Exam:  BP 130/86   Pulse 83   Ht '5\' 9"'$  (3.419 m)   Wt 181 lb 6.4 oz (82.3 kg)   SpO2 96%   BMI 26.79 kg/m  General: Well Developed, well nourished, and in no acute distress.   MSK: Left shoulder: Reduced strength.    Lab and Radiology Results    EXAM: MRI OF THE LEFT SHOULDER WITHOUT CONTRAST   TECHNIQUE: Multiplanar, multisequence MR imaging of the shoulder was performed. No intravenous contrast was administered.   COMPARISON:  Radiographs 08/02/2021   FINDINGS: Rotator cuff: Large full-thickness retracted rotator cuff tear. The supraspinatus and infraspinatus tendons are completely torn and retracted. Both tendons are retracted approximately 4 cm and there is associated fatty atrophy of the muscle suggesting chronic tears. The upper and mid fibers of the subscapularis tendon also torn and retracted up to 4 cm. Some of the lower fibers are still intact.   Muscles: Fatty atrophy of the supraspinatus and infraspinatus muscles.   Biceps long head: The biceps tendon is dislocated medially but appears to still be intact.   Acromioclavicular Joint: Mild degenerative changes. Type 2 acromion. No lateral downsloping or undersurface  spurring.   Glenohumeral Joint: Moderate degenerative changes. Moderate-sized joint effusion. The humeral head is riding high in the glenoid fossa.   Labrum:  Labral degenerative changes without obvious tear.   Bones:  No acute bony findings.   Other: Expected fluid in the subacromial/subdeltoid bursa.   IMPRESSION: 1. Large full-thickness retracted rotator cuff tear as described above. Associated fatty atrophy of the supraspinatus and infraspinatus muscles. 2. Medial dislocation of the long head biceps tendon. 3. Moderate glenohumeral joint degenerative changes with moderate-sized joint effusion.     Electronically Signed   By: Marijo Sanes M.D.   On: 09/03/2021 09:43   I, Lynne Leader, personally (independently) visualized and performed the interpretation of the images attached in this note.    Assessment and Plan: 76 y.o. male with chronic pain of the left shoulder due to DJD and chronic rotator cuff tears. At this point given the fatty atrophy is rotator cuff tears are repairable.  Surgical options in my understanding should be reverse total shoulder replacement especially considering his DJD and chronic pain.  He may have some benefit with conservative management including PT and injection although I think it is worthwhile for him to discuss with an orthopedic surgeon his surgical options.  He is willing to consider total shoulder replacement.  If injection and PT warranted happy to arrange that.  He lives in [place] so could use of regional location.   PDMP not reviewed this encounter. Orders Placed This Encounter  Procedures   Ambulatory referral to Orthopedic Surgery  Referral Priority:   Routine    Referral Type:   Surgical    Referral Reason:   Specialty Services Required    Referred to Provider:   Vanetta Mulders, MD    Requested Specialty:   Orthopedic Surgery    Number of Visits Requested:   1   No orders of the defined types were placed in this  encounter.    Discussed warning signs or symptoms. Please see discharge instructions. Patient expresses understanding.   The above documentation has been reviewed and is accurate and complete Lynne Leader, M.D.

## 2021-09-10 ENCOUNTER — Ambulatory Visit: Payer: Medicare HMO | Admitting: Family Medicine

## 2021-09-10 VITALS — BP 130/86 | HR 83 | Ht 69.0 in | Wt 181.4 lb

## 2021-09-10 DIAGNOSIS — G8929 Other chronic pain: Secondary | ICD-10-CM

## 2021-09-10 DIAGNOSIS — S46012D Strain of muscle(s) and tendon(s) of the rotator cuff of left shoulder, subsequent encounter: Secondary | ICD-10-CM

## 2021-09-10 DIAGNOSIS — M25512 Pain in left shoulder: Secondary | ICD-10-CM | POA: Diagnosis not present

## 2021-09-10 NOTE — Patient Instructions (Signed)
Thank you for coming in today.   I've referred you to orthopedic surgery. If you don't hear about scheduling within 1 week, please let me know.  Check back with me as needed

## 2021-09-15 ENCOUNTER — Other Ambulatory Visit: Payer: Self-pay | Admitting: Adult Health

## 2021-09-15 DIAGNOSIS — R7303 Prediabetes: Secondary | ICD-10-CM

## 2021-09-19 ENCOUNTER — Encounter: Payer: Self-pay | Admitting: Family Medicine

## 2021-09-19 ENCOUNTER — Other Ambulatory Visit: Payer: Self-pay | Admitting: Physical Therapy

## 2021-09-19 DIAGNOSIS — S46012D Strain of muscle(s) and tendon(s) of the rotator cuff of left shoulder, subsequent encounter: Secondary | ICD-10-CM

## 2021-09-19 DIAGNOSIS — G8929 Other chronic pain: Secondary | ICD-10-CM

## 2021-09-20 ENCOUNTER — Ambulatory Visit (HOSPITAL_BASED_OUTPATIENT_CLINIC_OR_DEPARTMENT_OTHER): Payer: Medicare HMO | Admitting: Orthopaedic Surgery

## 2021-09-23 DIAGNOSIS — M67912 Unspecified disorder of synovium and tendon, left shoulder: Secondary | ICD-10-CM | POA: Diagnosis not present

## 2021-09-25 ENCOUNTER — Ambulatory Visit (HOSPITAL_BASED_OUTPATIENT_CLINIC_OR_DEPARTMENT_OTHER): Payer: Medicare HMO | Admitting: Orthopaedic Surgery

## 2021-09-26 ENCOUNTER — Other Ambulatory Visit: Payer: Self-pay | Admitting: Adult Health

## 2021-09-26 DIAGNOSIS — R7303 Prediabetes: Secondary | ICD-10-CM

## 2021-10-03 ENCOUNTER — Encounter: Payer: Self-pay | Admitting: Adult Health

## 2021-10-03 ENCOUNTER — Ambulatory Visit (INDEPENDENT_AMBULATORY_CARE_PROVIDER_SITE_OTHER): Payer: Medicare HMO | Admitting: Adult Health

## 2021-10-03 ENCOUNTER — Ambulatory Visit (INDEPENDENT_AMBULATORY_CARE_PROVIDER_SITE_OTHER): Payer: Medicare HMO

## 2021-10-03 VITALS — BP 110/70 | HR 117 | Temp 98.3°F | Ht 69.0 in | Wt 178.0 lb

## 2021-10-03 DIAGNOSIS — J988 Other specified respiratory disorders: Secondary | ICD-10-CM | POA: Diagnosis not present

## 2021-10-03 DIAGNOSIS — F325 Major depressive disorder, single episode, in full remission: Secondary | ICD-10-CM

## 2021-10-03 DIAGNOSIS — R059 Cough, unspecified: Secondary | ICD-10-CM | POA: Diagnosis not present

## 2021-10-03 DIAGNOSIS — R7303 Prediabetes: Secondary | ICD-10-CM

## 2021-10-03 DIAGNOSIS — M159 Polyosteoarthritis, unspecified: Secondary | ICD-10-CM

## 2021-10-03 DIAGNOSIS — R69 Illness, unspecified: Secondary | ICD-10-CM | POA: Diagnosis not present

## 2021-10-03 DIAGNOSIS — R0602 Shortness of breath: Secondary | ICD-10-CM | POA: Diagnosis not present

## 2021-10-03 LAB — CBC WITH DIFFERENTIAL/PLATELET
Basophils Absolute: 0.1 10*3/uL (ref 0.0–0.1)
Basophils Relative: 0.7 % (ref 0.0–3.0)
Eosinophils Absolute: 0.3 10*3/uL (ref 0.0–0.7)
Eosinophils Relative: 2.5 % (ref 0.0–5.0)
HCT: 43.3 % (ref 39.0–52.0)
Hemoglobin: 14.7 g/dL (ref 13.0–17.0)
Lymphocytes Relative: 12.8 % (ref 12.0–46.0)
Lymphs Abs: 1.4 10*3/uL (ref 0.7–4.0)
MCHC: 34 g/dL (ref 30.0–36.0)
MCV: 92.4 fl (ref 78.0–100.0)
Monocytes Absolute: 0.9 10*3/uL (ref 0.1–1.0)
Monocytes Relative: 7.8 % (ref 3.0–12.0)
Neutro Abs: 8.6 10*3/uL — ABNORMAL HIGH (ref 1.4–7.7)
Neutrophils Relative %: 76.2 % (ref 43.0–77.0)
Platelets: 312 10*3/uL (ref 150.0–400.0)
RBC: 4.68 Mil/uL (ref 4.22–5.81)
RDW: 13.6 % (ref 11.5–15.5)
WBC: 11.3 10*3/uL — ABNORMAL HIGH (ref 4.0–10.5)

## 2021-10-03 LAB — BASIC METABOLIC PANEL
BUN: 37 mg/dL — ABNORMAL HIGH (ref 6–23)
CO2: 29 mEq/L (ref 19–32)
Calcium: 10.6 mg/dL — ABNORMAL HIGH (ref 8.4–10.5)
Chloride: 96 mEq/L (ref 96–112)
Creatinine, Ser: 1.32 mg/dL (ref 0.40–1.50)
GFR: 53.34 mL/min — ABNORMAL LOW (ref >60.00)
Glucose, Bld: 178 mg/dL — ABNORMAL HIGH (ref 70–99)
Potassium: 3.9 mEq/L (ref 3.5–5.1)
Sodium: 134 mEq/L — ABNORMAL LOW (ref 135–145)

## 2021-10-03 LAB — HEMOGLOBIN A1C: Hgb A1c MFr Bld: 6.1 % (ref 4.6–6.5)

## 2021-10-03 MED ORDER — CITALOPRAM HYDROBROMIDE 10 MG PO TABS: 10.0000 mg | ORAL_TABLET | Freq: Every day | ORAL | 1 refills | Status: DC

## 2021-10-03 MED ORDER — TRAMADOL HCL 50 MG PO TABS: 50.0000 mg | ORAL_TABLET | Freq: Four times a day (QID) | ORAL | 1 refills | Status: DC | PRN

## 2021-10-03 NOTE — Patient Instructions (Addendum)
It was great seeing you today   We will follow up with you regarding your blood work and chest xray   I have sent in celexa to help with depression  Please follow up with me in a month

## 2021-10-03 NOTE — Progress Notes (Signed)
Subjective:    Patient ID: Jose Evans, male    DOB: [birth year], 76 y.o.   MRN: [id number]  HPI 76 year old male who  has a past medical history of Adult acne, Cancer ([place]) (07/2017), Gout, Hypertension, and Thyroid disease.  He presents to the office today for 48-monthfollow-up regarding glucose intolerance.  During his physical in November 2022 his A1c was 6.5.  At this time we initiated him on metformin 500 mg twice daily.  He does report that he has been tolerating this medication well.  Additionally, he has some other issues he would like to discuss today   He has been having an ongoing issue with his left shoulder for the last 6 months or so.  He does have a complete tear of the left rotator cuff and is scheduled for surgery in less than a month.  He is in chronic pain which he uses prescribed tramadol for and this seems to help but with the constant discomfort and inability to do things that he once enjoyed he has become depressed.  He is currently on trazodone 50 mg for sleep but does not feel as though this helps with his depression.  Additionally, he reports that over the last 3 months then chest congestion, semiproductive cough normal fatigue, nasal congestion, and occasional fever.  He also feels short of breath with walking to the mailbox ( this is improving) as well as climbing stairs.  Denies shortness of breath while at rest.   Review of Systems See HPI   Past Medical History:  Diagnosis Date   Adult acne    Cancer ([place] 07/2017   skin cancer   Gout    Hypertension    Thyroid disease     Social History   Socioeconomic History   Marital status: Married    Spouse name: Not on file   Number of children: Not on file   Years of education: Not on file   Highest education level: Not on file  Occupational History   Not on file  Tobacco Use   Smoking status: Former    Packs/day: 0.50    Years: 30.00    Total pack years: 15.00    Types: Cigarettes    Quit date:  09/07/2005    Years since quitting: 16.0   Smokeless tobacco: Never  Vaping Use   Vaping Use: Never used  Substance and Sexual Activity   Alcohol use: Yes    Comment: FEW SHOTS A WEEK   Drug use: No   Sexual activity: Not on file  Other Topics Concern   Not on file  Social History Narrative   Works for [employer]   Married   m   Social Determinants of Health   Financial Resource Strain: Low Risk  (01/18/2021)   Overall Financial Resource Strain (CARDIA)    Difficulty of Paying Living Expenses: Not hard at all  Food Insecurity: No Food Insecurity (01/17/2021)   Hunger Vital Sign    Worried About Running Out of Food in the Last Year: Never true    [place] the [place] Never true  Transportation Needs: No Transportation Needs (01/18/2021)   PRAPARE - THydrologist(Medical): No    Lack of Transportation (Non-Medical): No  Physical Activity: Insufficiently Active (01/17/2021)   Exercise Vital Sign    Days of Exercise per Week: 3 days    Minutes of Exercise per Session: 40 min  Stress: No Stress Concern Present (01/17/2021)   [place]    Feeling of Stress : Not at all  Social Connections: Moderately Isolated (01/17/2021)   Social Connection and Isolation Panel [NHANES]    Frequency of Communication with Friends and Family: Twice a week    Frequency of Social Gatherings with Friends and Family: Twice a week    Attends Religious Services: Never    Marine scientist or Organizations: No    Attends Archivist Meetings: Never    Marital Status: Married  Human resources officer Violence: Not At Risk (01/17/2021)   Humiliation, Afraid, Rape, and Kick questionnaire    Fear of Current or Ex-Partner: No    Emotionally Abused: No    Physically Abused: No    Sexually Abused: No    Past Surgical History:  Procedure Laterality Date   HERNIA REPAIR     SHOULDER  SURGERY     TONSILLECTOMY     UPPER GASTROINTESTINAL ENDOSCOPY      Family History  Problem Relation Age of Onset   Hypertension Other    Colon cancer Neg Hx    Rectal cancer Neg Hx    Stomach cancer Neg Hx    Esophageal cancer Neg Hx     No Known Allergies  Current Outpatient Medications on File Prior to Visit  Medication Sig Dispense Refill   acetaminophen (TYLENOL) 500 MG tablet Take 1,000 mg by mouth every 6 (six) hours as needed for mild pain.     allopurinol (ZYLOPRIM) 300 MG tablet TAKE 1 TABLET BY MOUTH EVERY DAY (Patient taking differently: Take 300 mg by mouth daily.) 90 tablet 3   amphetamine-dextroamphetamine (ADDERALL) 10 MG tablet Take 1 tablet (10 mg total) by mouth daily. 30 tablet 0   amphetamine-dextroamphetamine (ADDERALL) 10 MG tablet Take 1 tablet (10 mg total) by mouth daily. 30 tablet 0   amphetamine-dextroamphetamine (ADDERALL) 10 MG tablet Take 1 tablet (10 mg total) by mouth daily with breakfast. 30 tablet 0   finasteride (PROSCAR) 5 MG tablet Take 1 tablet (5 mg total) by mouth daily. 90 tablet 3   fluoruracil (CARAC) 0.5 % cream Apply 1 application topically daily as needed.     levothyroxine (SYNTHROID) 175 MCG tablet Take 1 tablet (175 mcg total) by mouth daily before breakfast. 90 tablet 1   lisinopril (ZESTRIL) 5 MG tablet Take 1 tablet (5 mg total) by mouth daily. 90 tablet 3   LORazepam (ATIVAN) 0.5 MG tablet 1-2 tabs 30 - 60 min prior to MRI. Do not drive with this medicine. (Patient not taking: Reported on 09/10/2021) 4 tablet 0   metFORMIN (GLUCOPHAGE) 500 MG tablet TAKE 1 TABLET BY MOUTH TWICE A DAY WITH MEALS 180 tablet 0   metoprolol succinate (TOPROL-XL) 50 MG 24 hr tablet Take 1 tablet (50 mg total) by mouth daily. 90 tablet 1   podofilox (CONDYLOX) 0.5 % external solution Apply topically 2 (two) times daily. Twice a day for 3 days and then stop for four days. Can be repeated 4 times (Patient taking differently: Apply 1 application. topically 2  (two) times daily as needed (skin irritation). Twice a day for 3 days and then stop for four days. Can be repeated 4 times) 3.5 mL 1   traZODone (DESYREL) 50 MG tablet TAKE 1 TABLET BY MOUTH EVERY DAY AT BEDTIME AS NEEDED FOR SLEEP 90 tablet 1   triamcinolone cream (KENALOG) 0.1 % Apply 1 application topically 2 (  two) times daily. 30 g 0   triamterene-hydrochlorothiazide (MAXZIDE) 75-50 MG tablet Take 1 tablet by mouth daily. 90 tablet 3   XARELTO 20 MG TABS tablet TAKE 1 TABLET BY MOUTH DAILY WITH SUPPER. 30 tablet 11   No current facility-administered medications on file prior to visit.    BP 110/70   Pulse (!) 117   Temp 98.3 F (36.8 C) (Oral)   Ht '5\' 9"'$  (1.753 m)   Wt 178 lb (80.7 kg)   SpO2 95%   BMI 26.29 kg/m       Objective:   Physical Exam Vitals and nursing note reviewed.  Constitutional:      Appearance: Normal appearance.  HENT:     Head: Atraumatic.     Right Ear: Tympanic membrane, ear canal and external ear normal.     Left Ear: Tympanic membrane, ear canal and external ear normal.     Nose: Congestion and rhinorrhea (left nostril) present. Rhinorrhea is purulent.     Right Turbinates: Enlarged and swollen.     Left Turbinates: Enlarged and swollen.     Right Sinus: No maxillary sinus tenderness or frontal sinus tenderness.     Left Sinus: No maxillary sinus tenderness or frontal sinus tenderness.  Cardiovascular:     Rate and Rhythm: Normal rate and regular rhythm.     Pulses: Normal pulses.     Heart sounds: Normal heart sounds.  Pulmonary:     Effort: Pulmonary effort is normal.     Breath sounds: Normal breath sounds.  Musculoskeletal:        General: Tenderness present. Normal range of motion.  Skin:    General: Skin is warm and dry.  Neurological:     General: No focal deficit present.     Mental Status: He is alert and oriented to person, place, and time.  Psychiatric:        Mood and Affect: Mood normal.        Behavior: Behavior normal.         Thought Content: Thought content normal.        Judgment: Judgment normal.       Assessment & Plan:  1. Pre-diabetes - Consider increase in metformin  - Hemoglobin A1c; Future - Hemoglobin A1c  2. Depression, major, single episode, complete remission [facility]) Why Visit from 10/03/2021 in [place] at [place]  PHQ-9 Total Score 14       - Will add celexa to his regimen.  I think his depression will improve/resolve once he makes it through surgery and go through physical therapy and can start enjoying things that he wants.  In the past.  We will have him follow-up in 1 month or sooner if needed - citalopram (CELEXA) 10 MG tablet; Take 1 tablet (10 mg total) by mouth daily.  Dispense: 90 tablet; Refill: 1  3. Respiratory infection - Possible sinusitis vs pneumonia. Will check chest xray and decide on antibiotic regimen  - DG Chest 2 View; Future - CBC with Differential/Platelet; Future - Basic Metabolic Panel; Future - Basic Metabolic Panel - CBC with Differential/Platelet  4. Primary osteoarthritis involving multiple joints  - traMADol (ULTRAM) 50 MG tablet; Take 1 tablet (50 mg total) by mouth every 6 (six) hours as needed for moderate pain.  Dispense: 45 tablet; Refill: 1  [name], NP  Time of total for care on the day of the encounter 42 minutes. This includes time spent in both face-to-face and non-face-to-face activities  including preparing for the visit, reviewing the chart, time spent with patient, evaluation and counseling patient/family/caregiver, coordinating care, and time spent documenting in the chart which was performed on the date of service (10/03/2021). Note: this excludes any time spent performing billable procedures or separate charges (such as time spent counseling smoking cessation); these charges are billed separately.

## 2021-10-04 ENCOUNTER — Other Ambulatory Visit: Payer: Self-pay | Admitting: Adult Health

## 2021-10-07 ENCOUNTER — Telehealth: Payer: Self-pay | Admitting: *Deleted

## 2021-10-07 ENCOUNTER — Other Ambulatory Visit: Payer: Self-pay | Admitting: Orthopedic Surgery

## 2021-10-07 NOTE — Telephone Encounter (Signed)
Patient with diagnosis of afib on Xarelto for anticoagulation.    Procedure: LEFT REVERSE TOTAL SHOULDER ARTHROPLASTY Date of procedure: 10/24/21  CHA2DS2-VASc Score = 3   This indicates a 3.2% annual risk of stroke. The patient's score is based upon: CHF History: 0 HTN History: 1 Diabetes History: 1 Stroke History: 0 Vascular Disease History: 0 Age Score: 1 Gender Score: 0      CrCl 49 ml/min  Per office protocol, patient can hold Xarelto for 3 days prior to procedure.

## 2021-10-07 NOTE — Telephone Encounter (Signed)
   Pre-operative Risk Assessment    Patient Name: Jose Evans  DOB: 1945/09/12 MRN: [id number]      Request for Surgical Clearance    Procedure:   LEFT REVERSE TOTAL SHOULDER ARTHROPLASTY  Date of Surgery:  Clearance 10/24/21                                 Surgeon:  DR. Tania Ade Surgeon's Group or Practice Name:  [facility] Phone number:  [phone] [place] Fax number:  [phone]   Type of Clearance Requested:   - Medical  - Pharmacy:  Hold Rivaroxaban (Xarelto)     Type of Anesthesia:   CHOICE    Additional requests/questions:    Jiles Prows   10/07/2021, 11:58 AM

## 2021-10-07 NOTE — Telephone Encounter (Signed)
   Primary Cardiologist: Roderic Palau NP  Chart reviewed as part of pre-operative protocol coverage. Given past medical history and time since last visit, based on ACC/AHA guidelines, Jose Evans would be at acceptable risk for the planned procedure without further cardiovascular testing.   Patient with diagnosis of afib on Xarelto for anticoagulation.     Procedure: LEFT REVERSE TOTAL SHOULDER ARTHROPLASTY Date of procedure: 10/24/21   CHA2DS2-VASc Score = 3   This indicates a 3.2% annual risk of stroke. The patient's score is based upon: CHF History: 0 HTN History: 1 Diabetes History: 1 Stroke History: 0 Vascular Disease History: 0 Age Score: 1 Gender Score: 0       CrCl 49 ml/min   Per office protocol, patient can hold Xarelto for 3 days prior to procedure.  I will route this recommendation to the requesting party via Epic fax function and remove from pre-op pool.  Please call with questions.  Jose Evans. Andee Chivers NP-C    10/07/2021, [facility] [address] Office [phone] Fax [phone]

## 2021-10-15 ENCOUNTER — Other Ambulatory Visit: Payer: Self-pay

## 2021-10-15 ENCOUNTER — Encounter (HOSPITAL_COMMUNITY): Payer: Self-pay | Admitting: Physician Assistant

## 2021-10-15 ENCOUNTER — Emergency Department (HOSPITAL_COMMUNITY): Payer: Medicare HMO

## 2021-10-15 ENCOUNTER — Emergency Department (HOSPITAL_COMMUNITY)
Admission: EM | Admit: 2021-10-15 | Discharge: 2021-10-15 | Disposition: A | Discharge status: Home / Self Care | Payer: Medicare HMO | Attending: Emergency Medicine | Admitting: Emergency Medicine

## 2021-10-15 ENCOUNTER — Encounter (HOSPITAL_COMMUNITY): Payer: Self-pay

## 2021-10-15 ENCOUNTER — Encounter (HOSPITAL_COMMUNITY)
Admission: RE | Admit: 2021-10-15 | Discharge: 2021-10-15 | Disposition: A | Discharge status: Home / Self Care | Payer: Medicare HMO | Source: Ambulatory Visit | Attending: Orthopedic Surgery | Admitting: Orthopedic Surgery

## 2021-10-15 VITALS — BP 120/94 | HR 163 | Temp 98.4°F | Resp 18 | Ht 69.0 in | Wt 176.0 lb

## 2021-10-15 DIAGNOSIS — Z7902 Long term (current) use of antithrombotics/antiplatelets: Secondary | ICD-10-CM | POA: Insufficient documentation

## 2021-10-15 DIAGNOSIS — Z87891 Personal history of nicotine dependence: Secondary | ICD-10-CM | POA: Insufficient documentation

## 2021-10-15 DIAGNOSIS — I10 Essential (primary) hypertension: Secondary | ICD-10-CM | POA: Insufficient documentation

## 2021-10-15 DIAGNOSIS — Z7984 Long term (current) use of oral hypoglycemic drugs: Secondary | ICD-10-CM | POA: Insufficient documentation

## 2021-10-15 DIAGNOSIS — E039 Hypothyroidism, unspecified: Secondary | ICD-10-CM | POA: Diagnosis not present

## 2021-10-15 DIAGNOSIS — Z79899 Other long term (current) drug therapy: Secondary | ICD-10-CM | POA: Insufficient documentation

## 2021-10-15 DIAGNOSIS — M75102 Unspecified rotator cuff tear or rupture of left shoulder, not specified as traumatic: Secondary | ICD-10-CM | POA: Insufficient documentation

## 2021-10-15 DIAGNOSIS — Z85820 Personal history of malignant melanoma of skin: Secondary | ICD-10-CM | POA: Insufficient documentation

## 2021-10-15 DIAGNOSIS — M19012 Primary osteoarthritis, left shoulder: Secondary | ICD-10-CM | POA: Insufficient documentation

## 2021-10-15 DIAGNOSIS — Z01818 Encounter for other preprocedural examination: Secondary | ICD-10-CM | POA: Insufficient documentation

## 2021-10-15 DIAGNOSIS — I4892 Unspecified atrial flutter: Secondary | ICD-10-CM | POA: Insufficient documentation

## 2021-10-15 DIAGNOSIS — E119 Type 2 diabetes mellitus without complications: Secondary | ICD-10-CM | POA: Insufficient documentation

## 2021-10-15 DIAGNOSIS — R0602 Shortness of breath: Secondary | ICD-10-CM | POA: Diagnosis not present

## 2021-10-15 DIAGNOSIS — I480 Paroxysmal atrial fibrillation: Secondary | ICD-10-CM | POA: Insufficient documentation

## 2021-10-15 DIAGNOSIS — Z7901 Long term (current) use of anticoagulants: Secondary | ICD-10-CM | POA: Diagnosis not present

## 2021-10-15 DIAGNOSIS — R000 Tachycardia, unspecified: Secondary | ICD-10-CM | POA: Diagnosis present

## 2021-10-15 HISTORY — DX: Dyspnea, unspecified: R06.00

## 2021-10-15 HISTORY — DX: Hypothyroidism, unspecified: E03.9

## 2021-10-15 HISTORY — DX: Type 2 diabetes mellitus without complications: E11.9

## 2021-10-15 LAB — GLUCOSE, CAPILLARY: Glucose-Capillary: 238 mg/dL — ABNORMAL HIGH (ref 70–99)

## 2021-10-15 LAB — CBC
HCT: 42.8 % (ref 39.0–52.0)
Hemoglobin: 14.5 g/dL (ref 13.0–17.0)
MCH: 31.7 pg (ref 26.0–34.0)
MCHC: 33.9 g/dL (ref 30.0–36.0)
MCV: 93.4 fL (ref 80.0–100.0)
Platelets: 316 10*3/uL (ref 150–400)
RBC: 4.58 MIL/uL (ref 4.22–5.81)
RDW: 13.3 % (ref 11.5–15.5)
WBC: 11.9 10*3/uL — ABNORMAL HIGH (ref 4.0–10.5)
nRBC: 0 % (ref 0.0–0.2)

## 2021-10-15 LAB — BASIC METABOLIC PANEL
Anion gap: 7 (ref 5–15)
BUN: 34 mg/dL — ABNORMAL HIGH (ref 8–23)
CO2: 23 mmol/L (ref 22–32)
Calcium: 9.6 mg/dL (ref 8.9–10.3)
Chloride: 110 mmol/L (ref 98–111)
Creatinine, Ser: 1.11 mg/dL (ref 0.61–1.24)
GFR, Estimated: >60 mL/min (ref >60)
Glucose, Bld: 150 mg/dL — ABNORMAL HIGH (ref 70–99)
Potassium: 3.8 mmol/L (ref 3.5–5.1)
Sodium: 140 mmol/L (ref 135–145)

## 2021-10-15 LAB — TSH: TSH: 0.098 u[IU]/mL — ABNORMAL LOW (ref 0.350–4.500)

## 2021-10-15 LAB — SURGICAL PCR SCREEN
MRSA, PCR: NEGATIVE
Staphylococcus aureus: POSITIVE — AB

## 2021-10-15 LAB — MAGNESIUM: Magnesium: 2.1 mg/dL (ref 1.7–2.4)

## 2021-10-15 MED ORDER — DILTIAZEM HCL ER 60 MG PO CP12
120.0000 mg | ORAL_CAPSULE | Freq: Once | ORAL | Status: AC
Start: 2021-10-15 — End: 2021-10-15
  Administered 2021-10-15: 120 mg via ORAL
  Filled 2021-10-15: qty 2

## 2021-10-15 MED ORDER — DILTIAZEM HCL-DEXTROSE 125-5 MG/125ML-% IV SOLN (PREMIX)
5.0000 mg/h | INTRAVENOUS | Status: DC
  Administered 2021-10-15: 5 mg/h via INTRAVENOUS
  Filled 2021-10-15: qty 125

## 2021-10-15 MED ORDER — DILTIAZEM HCL ER 60 MG PO CP12: 120.0000 mg | ORAL_CAPSULE | Freq: Two times a day (BID) | ORAL | Status: DC

## 2021-10-15 MED ORDER — DILTIAZEM LOAD VIA INFUSION
5.0000 mg | Freq: Once | INTRAVENOUS | Status: AC
  Administered 2021-10-15: 5 mg via INTRAVENOUS
  Filled 2021-10-15: qty 5

## 2021-10-15 MED ORDER — CHLORHEXIDINE GLUCONATE CLOTH 2 % EX PADS: 6.0000 | MEDICATED_PAD | Freq: Every day | CUTANEOUS | Status: DC

## 2021-10-15 MED ORDER — MUPIROCIN 2 % EX OINT
1.0000 | TOPICAL_OINTMENT | Freq: Two times a day (BID) | CUTANEOUS | Status: DC
  Filled 2021-10-15: qty 22

## 2021-10-15 MED ORDER — ACETAMINOPHEN 325 MG PO TABS
650.0000 mg | ORAL_TABLET | Freq: Once | ORAL | Status: AC
  Administered 2021-10-15: 650 mg via ORAL
  Filled 2021-10-15: qty 2

## 2021-10-15 MED ORDER — SODIUM CHLORIDE 0.9 % IV BOLUS
500.0000 mL | Freq: Once | INTRAVENOUS | Status: AC
Start: 2021-10-15 — End: 2021-10-15
  Administered 2021-10-15: 500 mL via INTRAVENOUS

## 2021-10-16 ENCOUNTER — Encounter (HOSPITAL_COMMUNITY): Payer: Self-pay | Admitting: Nurse Practitioner

## 2021-10-16 ENCOUNTER — Ambulatory Visit (HOSPITAL_COMMUNITY)
Admit: 2021-10-16 | Discharge: 2021-10-16 | Disposition: A | Discharge status: Home / Self Care | Payer: Medicare HMO | Attending: Nurse Practitioner | Admitting: Nurse Practitioner

## 2021-10-16 VITALS — BP 110/76 | HR 121 | Ht 69.0 in | Wt 180.4 lb

## 2021-10-16 DIAGNOSIS — I4891 Unspecified atrial fibrillation: Secondary | ICD-10-CM

## 2021-10-16 DIAGNOSIS — I480 Paroxysmal atrial fibrillation: Secondary | ICD-10-CM | POA: Insufficient documentation

## 2021-10-16 DIAGNOSIS — R69 Illness, unspecified: Secondary | ICD-10-CM | POA: Diagnosis not present

## 2021-10-16 DIAGNOSIS — I484 Atypical atrial flutter: Secondary | ICD-10-CM | POA: Diagnosis not present

## 2021-10-16 DIAGNOSIS — F988 Other specified behavioral and emotional disorders with onset usually occurring in childhood and adolescence: Secondary | ICD-10-CM | POA: Diagnosis not present

## 2021-10-16 DIAGNOSIS — D6869 Other thrombophilia: Secondary | ICD-10-CM | POA: Diagnosis not present

## 2021-10-16 DIAGNOSIS — I4892 Unspecified atrial flutter: Secondary | ICD-10-CM | POA: Diagnosis not present

## 2021-10-16 DIAGNOSIS — Z7901 Long term (current) use of anticoagulants: Secondary | ICD-10-CM | POA: Insufficient documentation

## 2021-10-16 DIAGNOSIS — I10 Essential (primary) hypertension: Secondary | ICD-10-CM | POA: Diagnosis not present

## 2021-10-16 NOTE — Progress Notes (Signed)
Primary Care Physician: [name], NP Referring Physician: same GI: Dr. Yareth Macdonnell is a 76 y.o. male with a h/o of paroxysmal afib in the afib clinic for evaluation. He wore a monitor in October that showed 3% afib burden as well SVT. He was placed on ASA and referred to afib clinic.  His EKG today shows atrial flutter with variable block at 109 bpm. He is unaware. He has had episodes for years of having heart pain associated with racing, bilateral arm pain, pain at base of head and then a warm flush usually signals that it is breaking. At one time, he was having 5-6 episodes  a month. Now 1-2 a month. He takes prn metoprolol tartrate with episodes. He has a CHA2DS2VASc score of 2. No tobacco, caffeine or alcohol. Denies snoring.   Pt has had a recent positive cologuard and is pending colonoscopy 05/27/21. He has not noted any blood in stool.   F/u in afib clinic, 05/30/21. Unfortunately, he presented for colonoscopy 2/6 and was in afib with RVR. He was sent to the ER for further treatment. He was given diltiazem and sent home with rx for 120 mg daily along with his metoprolol. He was in rate controlled afib at time of d/c from  ER but is in SR today. He feels he flipped on his own later that day, he could not be cardioverted in the ER as he had been off his xarelto for 2 days for colonoscopy. He has been holding his lisinopril since Cardizem was added. BP elevated here but encouraged to follow at home and if BP is still running over 818 systolic would encourage him to  take daily.   F/u in afib clinic, 08/28/21 for 3 month f/u. He is in SR. He is c/o of arthralgia's that coincide with starting of diltiazem. He wakes up in the am, taking dilt in the am and develops aches by lunch with lack of energy and then feels better at bedtime. No afib to report.    F/u in afib clinic, 10/16/21. He is here to f/u ER visit form yesterday. He presented to pre op visit for upcoming shoulder surgery  09/25/21. He presented fairly asymptomatic with atypical atrial flutter at 160 bpm. I advised to treat in the ED for the high v rates of 160. In the ER, since he was fairly asymptomatic so he was given iv diltiazem and sent out on 120 mg cardiam in combo with his metoprolol that was reduced to 25 mg daily. I stopped Cardizem back in early May as he felt he was having aches and pains with it. Since coming off drug, his symptoms did not go away.   In the office today, he presented with aflutter vrs coase afib in the 120'sbut went back into SR in the 50's by the end of the visit.   We discuss antiarrythmic's today vrs ablation. He would prefer ablation but then there is the question to do ablation first and delay shoulder surgery or undergo shoulder surgery with short term AAD on board  and schedule ablation  for the end of the year. He is on celexa that is contraindicated with Multaq and 1st degree AV block would be an issue with flecainide. No h/o of CAD but no stress test has been done in the past.   Today, he denies symptoms of palpitations, chest pain, shortness of breath, orthopnea, PND, lower extremity edema, dizziness, presyncope, syncope, or neurologic sequela. The patient  is tolerating medications without difficulties and is otherwise without complaint today.   Past Medical History:  Diagnosis Date   Adult acne    Cancer ([place]) 07/2017   skin cancer   Diabetes mellitus without complication ([place])    Dyspnea    Gout    Hypertension    Hypothyroidism    Thyroid disease    Past Surgical History:  Procedure Laterality Date   HERNIA REPAIR  2007   x2   SHOULDER SURGERY Right 2018   TONSILLECTOMY     age 43    Current Outpatient Medications  Medication Sig Dispense Refill   acetaminophen (TYLENOL) 500 MG tablet Take 1,000 mg by mouth every 6 (six) hours as needed for mild pain.     allopurinol (ZYLOPRIM) 300 MG tablet TAKE 1 TABLET BY MOUTH EVERY DAY (Patient taking differently: Take 300  mg by mouth daily.) 90 tablet 3   amphetamine-dextroamphetamine (ADDERALL) 10 MG tablet Take 1 tablet (10 mg total) by mouth daily. (Patient taking differently: Take 5 mg by mouth daily.) 30 tablet 0   citalopram (CELEXA) 10 MG tablet Take 1 tablet (10 mg total) by mouth daily. 90 tablet 1   diltiazem (CARDIZEM) 120 MG tablet Take 120 mg by mouth daily.     finasteride (PROSCAR) 5 MG tablet Take 1 tablet (5 mg total) by mouth daily. 90 tablet 3   levothyroxine (SYNTHROID) 175 MCG tablet Take 1 tablet (175 mcg total) by mouth daily before breakfast. 90 tablet 1   lisinopril (ZESTRIL) 5 MG tablet Take 1 tablet (5 mg total) by mouth daily. 90 tablet 3   metFORMIN (GLUCOPHAGE) 500 MG tablet TAKE 1 TABLET BY MOUTH TWICE A DAY WITH MEALS (Patient taking differently: Take 250 mg by mouth 2 (two) times daily.) 180 tablet 0   metoprolol succinate (TOPROL-XL) 50 MG 24 hr tablet Take 1 tablet (50 mg total) by mouth daily. 90 tablet 1   traMADol (ULTRAM) 50 MG tablet Take 1 tablet (50 mg total) by mouth every 6 (six) hours as needed for moderate pain. (Patient taking differently: Take 50 mg by mouth at bedtime.) 45 tablet 1   traZODone (DESYREL) 50 MG tablet TAKE 1 TABLET BY MOUTH EVERY DAY AT BEDTIME AS NEEDED FOR SLEEP (Patient taking differently: Take 50 mg by mouth at bedtime as needed for sleep.) 90 tablet 1   triamterene-hydrochlorothiazide (MAXZIDE) 75-50 MG tablet Take 1 tablet by mouth daily. 90 tablet 3   XARELTO 20 MG TABS tablet TAKE 1 TABLET BY MOUTH DAILY WITH SUPPER. (Patient taking differently: Take 20 mg by mouth daily with supper.) 30 tablet 11   No current facility-administered medications for this encounter.    Allergies  Allergen Reactions   Glucophage [Metformin] Other (See Comments)    Severe fatigue    Social History   Socioeconomic History   Marital status: Married    Spouse name: Not on file   Number of children: Not on file   Years of education: Not on file   Highest  education level: Not on file  Occupational History   Not on file  Tobacco Use   Smoking status: Former    Packs/day: 0.50    Years: 30.00    Total pack years: 15.00    Types: Cigarettes    Quit date: 09/07/2005    Years since quitting: 16.1   Smokeless tobacco: Never  Vaping Use   Vaping Use: Never used  Substance and Sexual Activity   Alcohol use: Not  Currently   Drug use: No   Sexual activity: Not on file  Other Topics Concern   Not on file  Social History Narrative   Works for [employer]    Married   m   Social Determinants of Health   Financial Resource Strain: Low Risk  (01/18/2021)   Overall Financial Resource Strain (CARDIA)    Difficulty of Paying Living Expenses: Not hard at all  Food Insecurity: No Food Insecurity (01/17/2021)   Hunger Vital Sign    Worried About Running Out of Food in the Last Year: Never true    [place] in the Last Year: Never true  Transportation Needs: No Transportation Needs (01/18/2021)   PRAPARE - Hydrologist (Medical): No    Lack of Transportation (Non-Medical): No  Physical Activity: Insufficiently Active (01/17/2021)   Exercise Vital Sign    Days of Exercise per Week: 3 days    Minutes of Exercise per Session: 40 min  Stress: No Stress Concern Present (01/17/2021)   [place]    Feeling of Stress : Not at all  Social Connections: Moderately Isolated (01/17/2021)   Social Connection and Isolation Panel [NHANES]    Frequency of Communication with Friends and Family: Twice a week    Frequency of Social Gatherings with Friends and Family: Twice a week    Attends Religious Services: Never    Marine scientist or Organizations: No    Attends Archivist Meetings: Never    Marital Status: Married  Human resources officer Violence: Not At Risk (01/17/2021)   Humiliation, Afraid, Rape, and Kick questionnaire    Fear of  Current or Ex-Partner: No    Emotionally Abused: No    Physically Abused: No    Sexually Abused: No    Family History  Problem Relation Age of Onset   Hypertension Other    Colon cancer Neg Hx    Rectal cancer Neg Hx    Stomach cancer Neg Hx    Esophageal cancer Neg Hx     ROS- All systems are reviewed and negative except as per the HPI above  Physical Exam: Vitals:   10/16/21 1050  BP: 110/76  Pulse: (!) 121  Weight: 81.8 kg  Height: '5\' 9"'$  (1.753 m)   Wt Readings from Last 3 Encounters:  10/16/21 81.8 kg  10/15/21 79.8 kg  10/03/21 80.7 kg    Labs: Lab Results  Component Value Date   NA 140 10/15/2021   K 3.8 10/15/2021   CL 110 10/15/2021   CO2 23 10/15/2021   GLUCOSE 150 (H) 10/15/2021   BUN 34 (H) 10/15/2021   CREATININE 1.11 10/15/2021   CALCIUM 9.6 10/15/2021   MG 2.1 10/15/2021   No results found for: "INR" Lab Results  Component Value Date   CHOL 155 02/28/2021   HDL 39.20 02/28/2021   LDLCALC 88 02/28/2021   TRIG 138.0 02/28/2021     GEN- The patient is well appearing, alert and oriented x 3 today.   Head- normocephalic, atraumatic Eyes-  Sclera clear, conjunctiva pink Ears- hearing intact Oropharynx- clear Neck- supple, no JVP Lymph- no cervical lymphadenopathy Lungs- Clear to ausculation bilaterally, normal work of breathing Heart- irregular rate and rhythm on presentation but then converted to SR , no murmurs, rubs or gallops, PMI not laterally displaced GI- soft, NT, ND, + BS Extremities- no clubbing, cyanosis, or edema MS- no significant deformity  or atrophy Skin- no rash or lesion Psych- euthymic mood, full affect Neuro- strength and sensation are intact  EKG- Vent. rate 109 BPM on presentation PR interval * ms QRS duration 90 ms QT/QTcB 344/463 ms P-R-T axes * 77 10 Atrial flutter with variable A-V block , new Abnormal ECG Confirmed by Dixie Dials ([id number]) on 04/17/2021 10:13:18 AM  Vent. rate 54 BPM at end of clinic visit   PR interval 218 ms QRS duration 92 ms QT/QTcB 448/424 ms P-R-T axes 56 70 57 Sinus bradycardia with 1st degree A-V block Otherwise normal ECG When compared with ECG of 16-Oct-2021 11:18, PREVIOUS ECG IS PRESENT Since last tracing rate slower and atrial flutter resolved. Confirmed by Daneen Schick [id number]) on 10/16/2021 12:29:18 PM    Epic records reviewed   Assessment and Plan:  1. Paroxysmal afib/flutter  Present at time of colonoscopy in February   with v rates in the 160's so procedure was cancelled He was seen in the ER and Cardizem drip was used and then transitioned to po Diltiazem  120 mg daily  was added  He returned to SR later that  day  He then thought  developed arthralgia's and thought it was related to Cardizem Cardizem was stopped  Increased  metoprolol succinate  to 50  mg daily   He presented yesterday to Pre op visit for shoulder surgery 7/6 and was in atypical atrial flutter at 160 bpm He was sent to ER and rate slowed with IV Cardizem and sent home on diltiazem 120 mg qd along with metoprolol 2 '5mg'$  daily He has  now returned to SR in the office  Discussion re antiarrythmic's vrs ablation and to proceed to shoulder surgery on short term AAD or have consult for ablation and this  procedure first and then shoulder surgery later in the year   He would prefer to cancel shoulder surgery and address heart issues   I have referred to EP to discuss ablation   2. CHA2DS2VASc  score of 2 Continue xarelto 20 mg daily     3. HTN Stable      Kerrion Kemppainen C. Guerin Lashomb, [facility] [address] [place], [place] [address] [phone]

## 2021-10-17 NOTE — Progress Notes (Signed)
Anesthesia Chart Review   Case: [id number] Date/Time: 10/24/21 0715   Procedure: REVERSE SHOULDER ARTHROPLASTY (Left: Shoulder)   Anesthesia type: Choice   Pre-op diagnosis: LEFT SHOULDER ROTATOR CUFF TEAR AND OSTEOARTHRITIS   Location: [facility] ROOM 07 / [facility]   Surgeons: Tania Ade, MD       DISCUSSION:76 y.o. former smoker with h/o HTN, DM II, atrial fibrillation, left shoulder rotator cuff tear and OA scheduled for above procedure 10/24/2021 with Dr. Tania Ade.   Pt found to be in a flutter with RVR, rate 150s, at PAT visit.  Pt referred to ED for further evaluation.  Discussed with a-fib clinic.    Follow up with [name] clinic 10/16/2021. Per note pt will cancel shoulder surgery to pursue ablation.   VS: BP (!) 120/94   Pulse (!) 163   Temp 36.9 C (Oral)   Resp 18   Ht '5\' 9"'$  (1.753 m)   Wt 79.8 kg   BMI 25.99 kg/m   PROVIDERS: [name], NP is PCP    LABS: Labs reviewed: Acceptable for surgery. (all labs ordered are listed, but only abnormal results are displayed)  Labs Reviewed  SURGICAL PCR SCREEN - Abnormal; Notable for the following components:      Result Value   Staphylococcus aureus POSITIVE (*)    All other components within normal limits  GLUCOSE, CAPILLARY - Abnormal; Notable for the following components:   Glucose-Capillary 238 (*)    All other components within normal limits  TYPE AND SCREEN     IMAGES:   EKG:   CV: Echo 04/17/2021 1. Left ventricular ejection fraction, by estimation, is 60 to 65%. The  left ventricle has normal function. The left ventricle has no regional  wall motion abnormalities. Left ventricular diastolic parameters are  indeterminate. Elevated left ventricular  end-diastolic pressure. The E/e' is 17.8.   2. Right ventricular systolic function is normal. The right ventricular  size is normal. There is normal pulmonary artery systolic pressure. The  estimated right ventricular systolic pressure is 34.7 mmHg.    3. The mitral valve is grossly normal. Trivial mitral valve  regurgitation. No evidence of mitral stenosis.   4. The aortic valve is tricuspid. Aortic valve regurgitation is not  visualized. No aortic stenosis is present.   5. The inferior vena cava is normal in size with greater than 50%  respiratory variability, suggesting right atrial pressure of 3 mmHg.  Past Medical History:  Diagnosis Date   Adult acne    Cancer ([place]) 07/2017   skin cancer   Diabetes mellitus without complication ([place])    Dyspnea    Gout    Hypertension    Hypothyroidism    Thyroid disease     Past Surgical History:  Procedure Laterality Date   HERNIA REPAIR  2007   x2   SHOULDER SURGERY Right 2018   TONSILLECTOMY     age 12    MEDICATIONS:  acetaminophen (TYLENOL) 500 MG tablet   allopurinol (ZYLOPRIM) 300 MG tablet   amphetamine-dextroamphetamine (ADDERALL) 10 MG tablet   citalopram (CELEXA) 10 MG tablet   diltiazem (CARDIZEM) 120 MG tablet   finasteride (PROSCAR) 5 MG tablet   levothyroxine (SYNTHROID) 175 MCG tablet   lisinopril (ZESTRIL) 5 MG tablet   metFORMIN (GLUCOPHAGE) 500 MG tablet   metoprolol succinate (TOPROL-XL) 50 MG 24 hr tablet   traMADol (ULTRAM) 50 MG tablet   traZODone (DESYREL) 50 MG tablet   triamterene-hydrochlorothiazide (MAXZIDE) 75-50 MG tablet   XARELTO  20 MG TABS tablet   No current facility-administered medications for this encounter.     [name], PA-C [facility] [phone]

## 2021-10-18 ENCOUNTER — Other Ambulatory Visit: Payer: Self-pay | Admitting: Adult Health

## 2021-10-18 DIAGNOSIS — M159 Polyosteoarthritis, unspecified: Secondary | ICD-10-CM

## 2021-10-21 ENCOUNTER — Telehealth: Payer: Self-pay | Admitting: Adult Health

## 2021-10-21 NOTE — Telephone Encounter (Signed)
Pt called to request a refill for:  traMADol (ULTRAM) 50 MG tablet  [id number] [address]:  [phone] Fax:  [phone]    Pt stated pharmacy told him they do not have anything for him.  Please advise.

## 2021-10-23 ENCOUNTER — Telehealth: Payer: Self-pay | Admitting: Pharmacist

## 2021-10-23 NOTE — Telephone Encounter (Signed)
Pt notified that it is too early yo fill. However, pt stated that he was able to pick up the older prescription from 10/18/2021.

## 2021-10-23 NOTE — Chronic Care Management (AMB) (Signed)
Done in error.

## 2021-10-23 NOTE — Chronic Care Management (AMB) (Signed)
Chronic Care Management Pharmacy Assistant   Name: Jose Evans  MRN: [id number] DOB: [birth year]  Reason for Encounter: Disease State / Hypertension Assessment Call   Conditions to be addressed/monitored: HTN  Recent office visits:  10/03/2021 [name] NP - Patient was seen for pre-diabetes and additional issues. Started Celexa 10 mg daily. Increased Tramadol 50 mg to 1 every 6 hours as needed. Follow up in 1 month.  06/18/2021 [name] NP - Patient was seen for Primary osteoarthritis involving multiple joints. Started Tramadol 50 mg every 8 hours prn. Discontinued Meloxicam. No follow up noted.   Recent consult visits:  10/16/2021 Roderic Palau NP (cardiology) - Patient was seen for Atypical atrial flutter and additional issues. No medication changes. No follow up noted.  09/10/2021 Lynne Leader MD (sport medicine) - Patient was seen for chronic left shoulder pain and an additional issue. No medication changes. No follow up noted.   08/31/2021 Kalman Jewels with [facility]. Patient was seen for weakness and additional issues. No other chart notes.   08/02/2021 Lynne Leader MD (sport medicine) - Patient was seen for Traumatic complete tear of left rotator cuff, initial encounter and additional issues. Started Lorazepam 0.5 mg 1-2 tabs 60 min prior to MRI. Follow up after MRI.   Hospital visits:  Patient was seen at [facility] ED on 10/15/2021 (6 hours) due to Atrial flutter with rapid ventricular response.    New?Medications Started at [facility] Discharge:?? No medications started Medication Changes at Hospital Discharge: No medication changes Medications Discontinued at Hospital Discharge: No medications discontinued Medications that remain the same after Hospital Discharge:??  -All other medications will remain the same.     Patient was seen at [facility] ED on 05/27/2021 (4 hours) due to Atrial flutter with  rapid ventricular response.    New?Medications Started at [facility] Discharge:?? Started Diltiazem 120 mg once daily Medication Changes at Hospital Discharge: No medication changes Medications Discontinued at Hospital Discharge: No medications discontinued Medications that remain the same after Hospital Discharge:??  -All other medications will remain the same.     Medications: Outpatient Encounter Medications as of 10/23/2021  Medication Sig Note   acetaminophen (TYLENOL) 500 MG tablet Take 1,000 mg by mouth every 6 (six) hours as needed for mild pain.    allopurinol (ZYLOPRIM) 300 MG tablet TAKE 1 TABLET BY MOUTH EVERY DAY (Patient taking differently: Take 300 mg by mouth daily.)    amphetamine-dextroamphetamine (ADDERALL) 10 MG tablet Take 1 tablet (10 mg total) by mouth daily. (Patient taking differently: Take 5 mg by mouth daily.) 10/15/2021: Took 5 mg this morning (10/15/21)   citalopram (CELEXA) 10 MG tablet Take 1 tablet (10 mg total) by mouth daily.    diltiazem (CARDIZEM) 120 MG tablet Take 120 mg by mouth daily.    finasteride (PROSCAR) 5 MG tablet Take 1 tablet (5 mg total) by mouth daily.    levothyroxine (SYNTHROID) 175 MCG tablet Take 1 tablet (175 mcg total) by mouth daily before breakfast.    lisinopril (ZESTRIL) 5 MG tablet Take 1 tablet (5 mg total) by mouth daily.    metFORMIN (GLUCOPHAGE) 500 MG tablet TAKE 1 TABLET BY MOUTH TWICE A DAY WITH MEALS (Patient taking differently: Take 250 mg by mouth 2 (two) times daily.) 10/15/2021: Pt last took night of 10/13/21, but stopped taking due to side effects.   metoprolol succinate (TOPROL-XL) 50 MG 24 hr tablet Take 1 tablet (50 mg total) by mouth daily.  traMADol (ULTRAM) 50 MG tablet Take 1 tablet (50 mg total) by mouth at bedtime.    traZODone (DESYREL) 50 MG tablet TAKE 1 TABLET BY MOUTH EVERY DAY AT BEDTIME AS NEEDED FOR SLEEP (Patient taking differently: Take 50 mg by mouth at bedtime as needed for sleep.)     triamterene-hydrochlorothiazide (MAXZIDE) 75-50 MG tablet Take 1 tablet by mouth daily.    XARELTO 20 MG TABS tablet TAKE 1 TABLET BY MOUTH DAILY WITH SUPPER. (Patient taking differently: Take 20 mg by mouth daily with supper.)    No facility-administered encounter medications on file as of 10/23/2021.  Fill History:  ALLOPURINOL 300 MG TABLET 08/12/2021 90   DEXTROAMP-AMPHETAMIN 10 MG TAB 09/23/2021 30   CITALOPRAM HBR 10 MG TABLET 10/03/2021 90   DILTIAZEM 12HR ER 120 MG CAP 06/21/2021 90   LEVOTHYROXIN 175MCG TAB 07/29/2021 90   FINASTERIDE 5 MG TABLET 08/22/2021 90   LISINOPRIL 5 MG TABLET 08/17/2021 90   METFORMIN HCL 500 MG TABLET 09/17/2021 90   METOPROLOL SUCC ER 50 MG TAB 08/28/2021 90   XARELTO 20 MG TABLET 10/18/2021 30   TRAMADOL HCL 50 MG TABLET 10/21/2021 11   TRAZODONE 50 MG TABLET 08/08/2021 90   TRIAMTERENE-HCTZ 75-50 MG TAB 09/22/2021 90   Reviewed chart prior to disease state call. Spoke with patient regarding BP  Recent Office Vitals: BP Readings from Last 3 Encounters:  10/16/21 110/76  10/15/21 105/85  10/15/21 (!) 120/94   Pulse Readings from Last 3 Encounters:  10/16/21 (!) 121  10/15/21 81  10/15/21 (!) 163    Wt Readings from Last 3 Encounters:  10/16/21 180 lb 6.4 oz (81.8 kg)  10/15/21 176 lb (79.8 kg)  10/03/21 178 lb (80.7 kg)     Kidney Function Lab Results  Component Value Date/Time   CREATININE 1.11 10/15/2021 11:00 AM   CREATININE 1.32 10/03/2021 02:11 PM   CREATININE 1.14 01/26/2020 01:38 PM   GFR 53.34 (L) 10/03/2021 02:11 PM   GFRNONAA >60 10/15/2021 11:00 AM   GFRNONAA 64 01/26/2020 01:38 PM   GFRAA 74 01/26/2020 01:38 PM       Latest Ref Rng & Units 10/15/2021   11:00 AM 10/03/2021    2:11 PM 05/27/2021   12:16 PM  BMP  Glucose 70 - 99 mg/dL 150  178  135   BUN 8 - 23 mg/dL 34  37  19   Creatinine 0.61 - 1.24 mg/dL 1.11  1.32  0.92   Sodium 135 - 145 mmol/L 140  134  141   Potassium 3.5 - 5.1 mmol/L 3.8  3.9  3.9    Chloride 98 - 111 mmol/L 110  96  101   CO2 22 - 32 mmol/L '23  29  26   '$ Calcium 8.9 - 10.3 mg/dL 9.6  10.6  9.8     Current antihypertensive regimen:  Diltiazem 120 mg daily Lisinopril 5 mg daily Metoprolol 50 mg daily Triamterene HCTZ 75/50 mg daily  How often are you checking your Blood Pressure?   Current home BP readings:   What recent interventions/DTPs have been made by any provider to improve Blood Pressure control since last CPP Visit: No recent interventions  Any recent hospitalizations or ED visits since last visit with CPP? Patient was seen at [facility] ED on 10/15/2021 (6 hours) due to Atrial flutter with rapid ventricular response.  Patient was seen at [facility] ED on 05/27/2021 (4 hours) due to Atrial flutter with rapid ventricular response.  What diet changes have been made to improve Blood Pressure Control?  Diet followed Breakfast - patient will have Lunch - patient will have [place] - patient will have  What exercise is being done to improve your Blood Pressure Control?    Adherence Review: Is the patient currently on ACE/ARB medication? Yes Does the patient have >5 day gap between last estimated fill dates? No  Unable to reach patient after several attempts.  Care Gaps: AWV - completed 01/17/2021 Last BP - 110/70 on 10/03/2021 Last A1C - 6.1 on 10/03/2021 Shingrix - never done Colonoscopy - never done Covid booster - overdue Tdap - overdue  Star Rating Drugs: Lisinopril 5 mg - [facility] Metformin 500 mg - last filled 09/17/2021 90 DS at [place] Pharmacist Assistant [phone]

## 2021-10-24 ENCOUNTER — Encounter (HOSPITAL_COMMUNITY): Admission: RE | Payer: Self-pay | Source: Home / Self Care

## 2021-10-24 ENCOUNTER — Ambulatory Visit (HOSPITAL_COMMUNITY): Admission: RE | Admit: 2021-10-24 | Payer: Medicare HMO | Source: Home / Self Care | Admitting: Orthopedic Surgery

## 2021-10-24 DIAGNOSIS — R7303 Prediabetes: Secondary | ICD-10-CM

## 2021-10-24 DIAGNOSIS — E119 Type 2 diabetes mellitus without complications: Secondary | ICD-10-CM

## 2021-10-24 DIAGNOSIS — Z01818 Encounter for other preprocedural examination: Secondary | ICD-10-CM

## 2021-10-24 DIAGNOSIS — I10 Essential (primary) hypertension: Secondary | ICD-10-CM

## 2021-10-24 LAB — TYPE AND SCREEN
ABO/RH(D): O POS
Antibody Screen: NEGATIVE

## 2021-10-24 SURGERY — ARTHROPLASTY, SHOULDER, TOTAL, REVERSE
Anesthesia: Choice | Site: Shoulder | Laterality: Left

## 2021-10-28 DIAGNOSIS — M75122 Complete rotator cuff tear or rupture of left shoulder, not specified as traumatic: Secondary | ICD-10-CM | POA: Diagnosis not present

## 2021-10-28 DIAGNOSIS — M25612 Stiffness of left shoulder, not elsewhere classified: Secondary | ICD-10-CM | POA: Diagnosis not present

## 2021-10-28 DIAGNOSIS — R531 Weakness: Secondary | ICD-10-CM | POA: Diagnosis not present

## 2021-10-31 DIAGNOSIS — M75122 Complete rotator cuff tear or rupture of left shoulder, not specified as traumatic: Secondary | ICD-10-CM | POA: Diagnosis not present

## 2021-10-31 DIAGNOSIS — M25612 Stiffness of left shoulder, not elsewhere classified: Secondary | ICD-10-CM | POA: Diagnosis not present

## 2021-10-31 DIAGNOSIS — R531 Weakness: Secondary | ICD-10-CM | POA: Diagnosis not present

## 2021-11-02 IMAGING — DX DG ELBOW COMPLETE 3+V*L*
4 series · 4 of 4 positions shown · non-contrast
Comparison: None.

CLINICAL DATA: Fall, left elbow pain.

EXAM:
LEFT ELBOW - COMPLETE 3+ VIEW

[elbow ap]
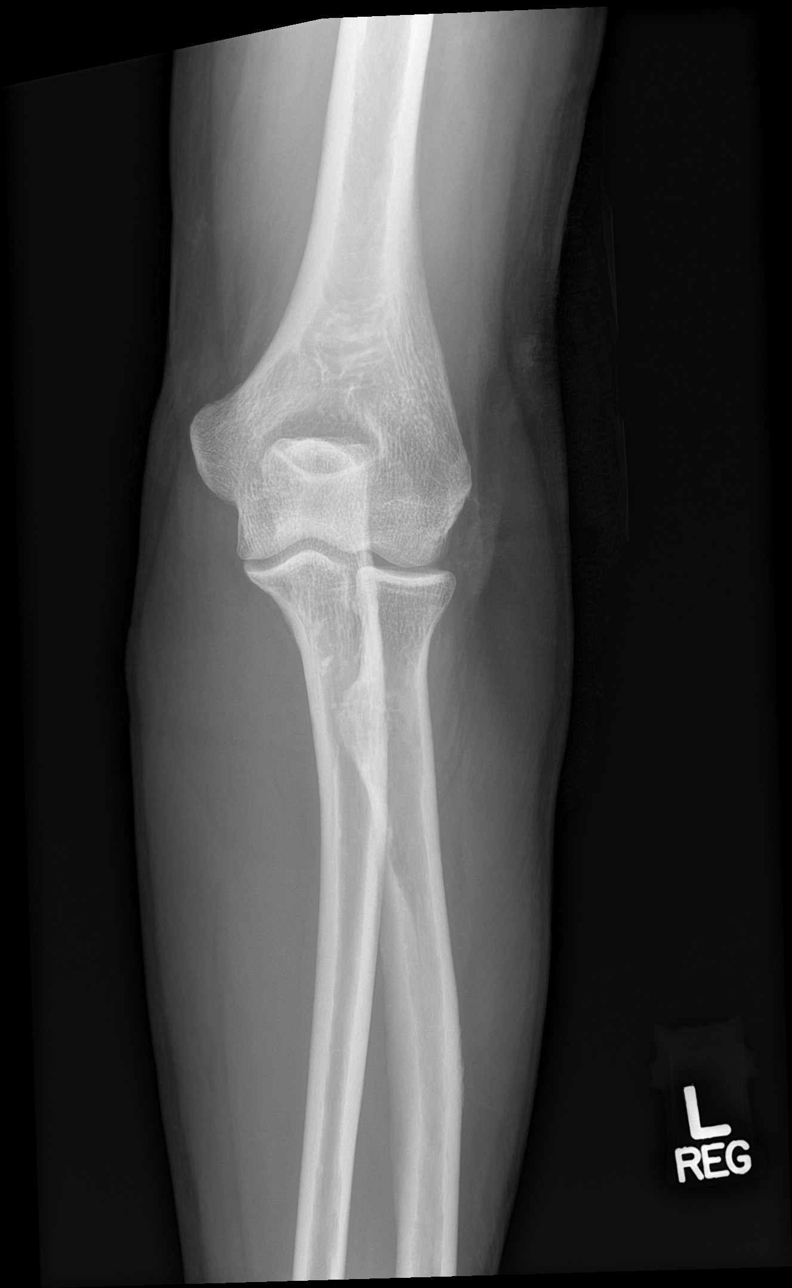

[elbow obl (1 of 2)]
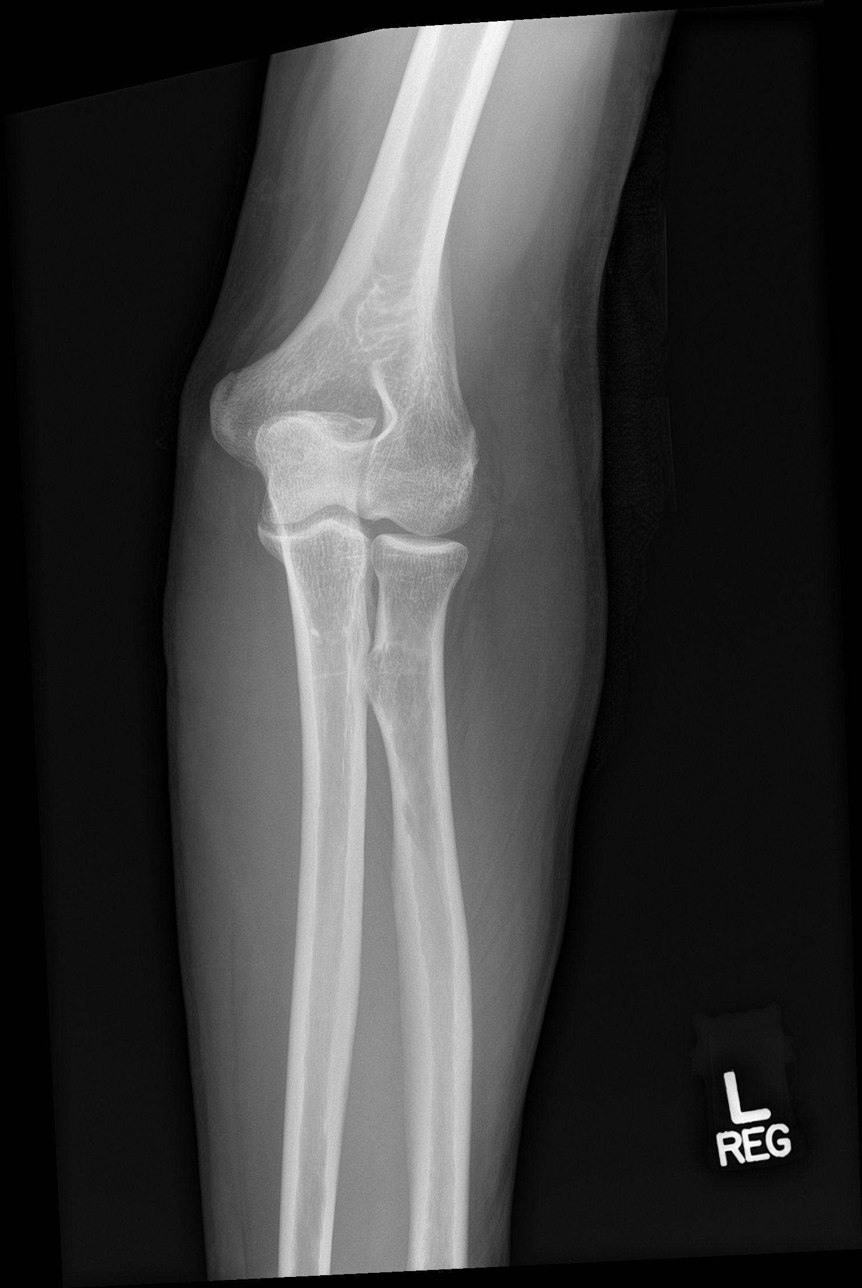

[elbow obl (2 of 2)]
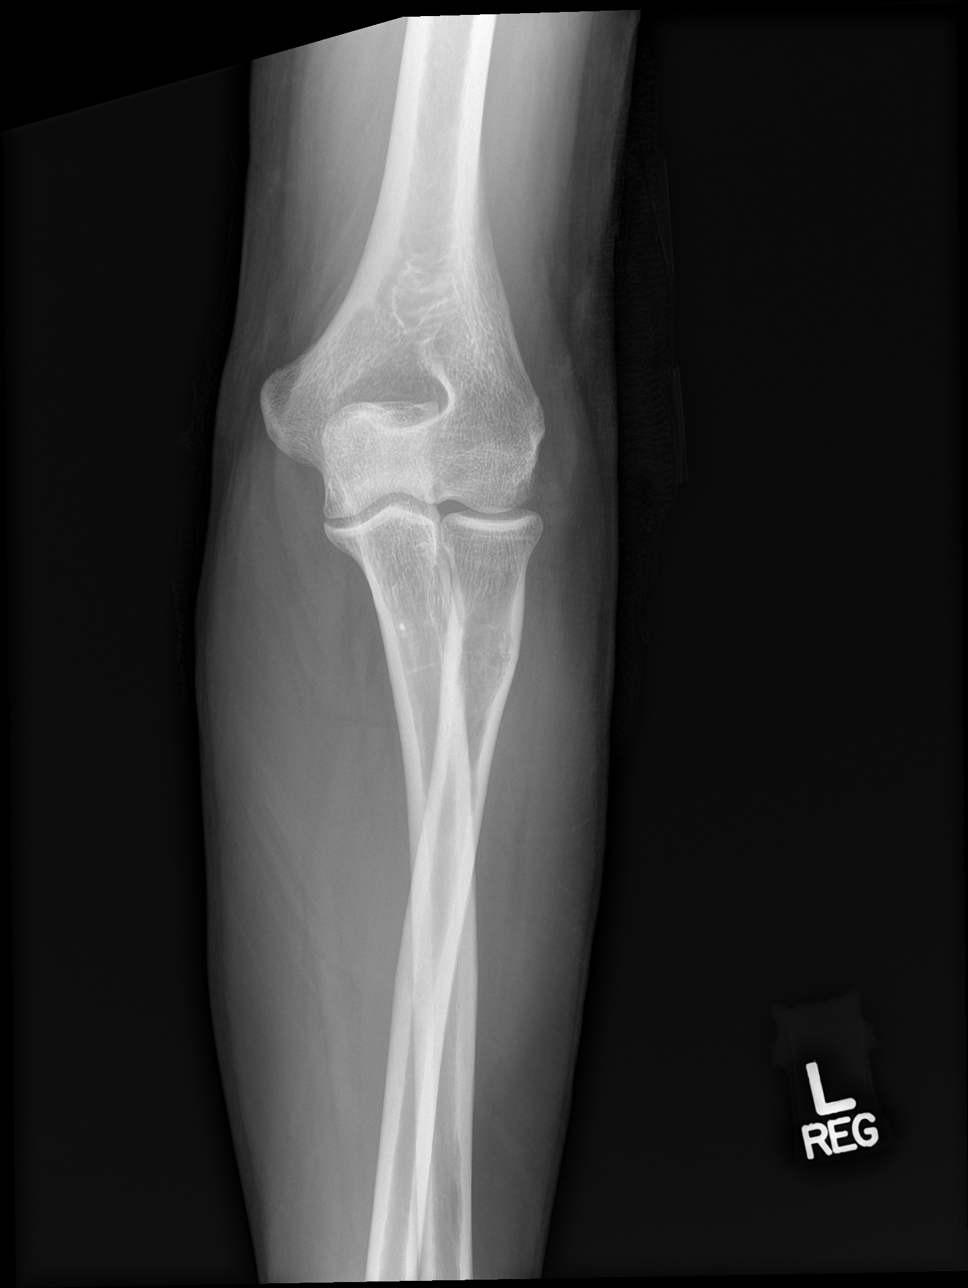

[elbow lat]
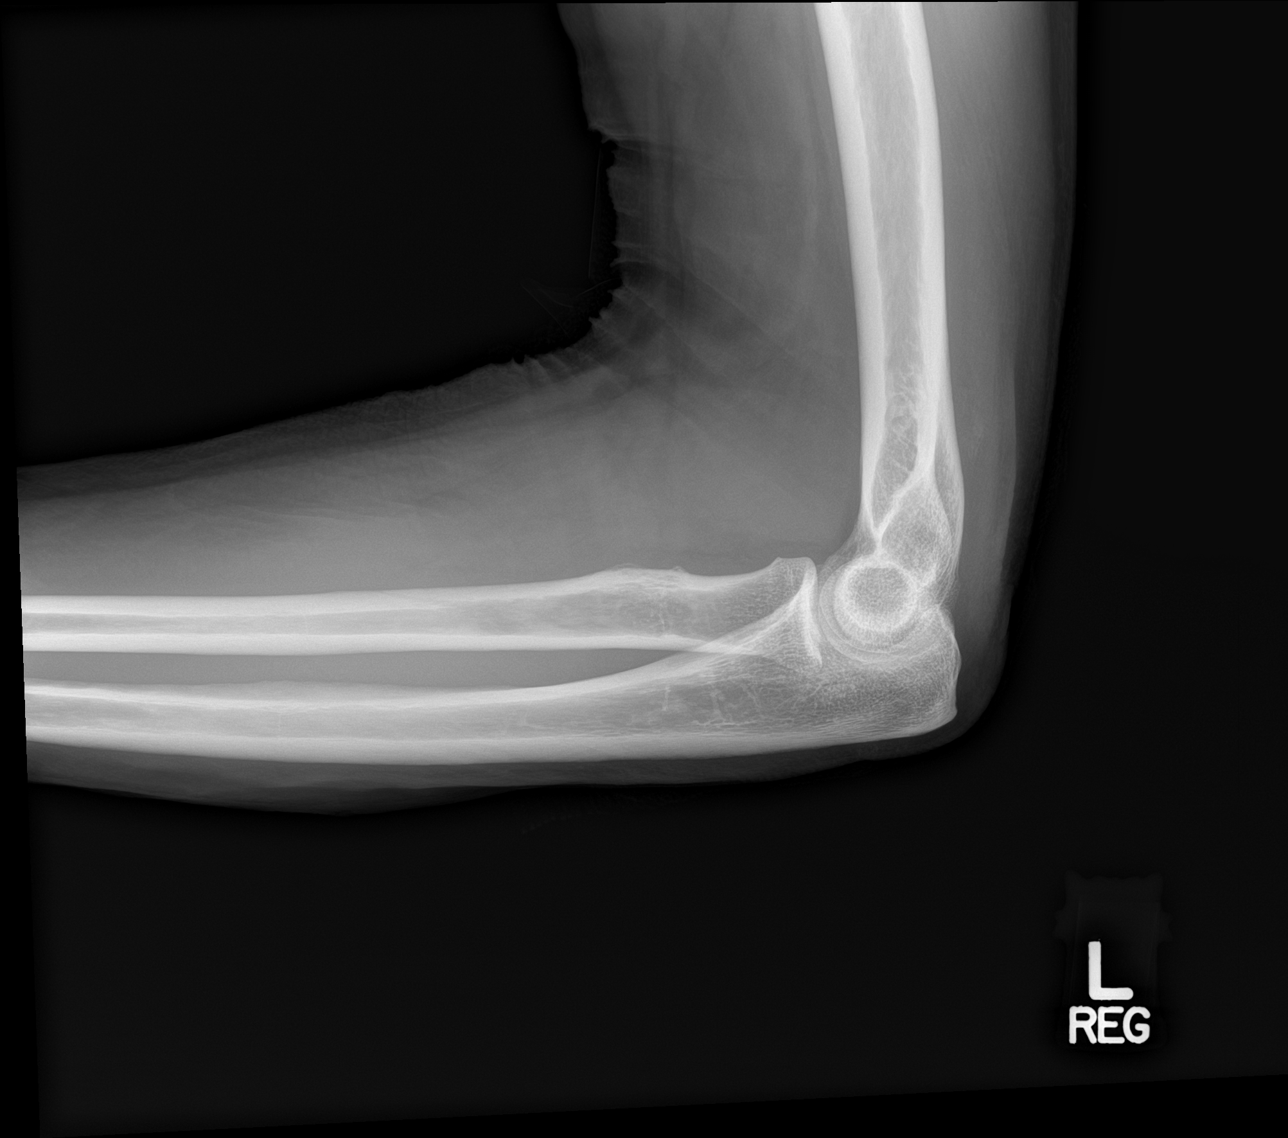

[4 of 4 positions shown; findings below may reference images not displayed]

FINDINGS: There is no evidence of fracture, dislocation, or joint effusion.
There is no evidence of arthropathy or other focal bone abnormality.
Soft tissues are unremarkable.
IMPRESSION: Negative.

## 2021-11-02 IMAGING — DX DG HUMERUS 2V *L*
2 series · 2 of 2 positions shown · non-contrast
Comparison: None.

CLINICAL DATA: Fall, left arm pain

EXAM:
LEFT HUMERUS - 2+ VIEW

[humerus ap]
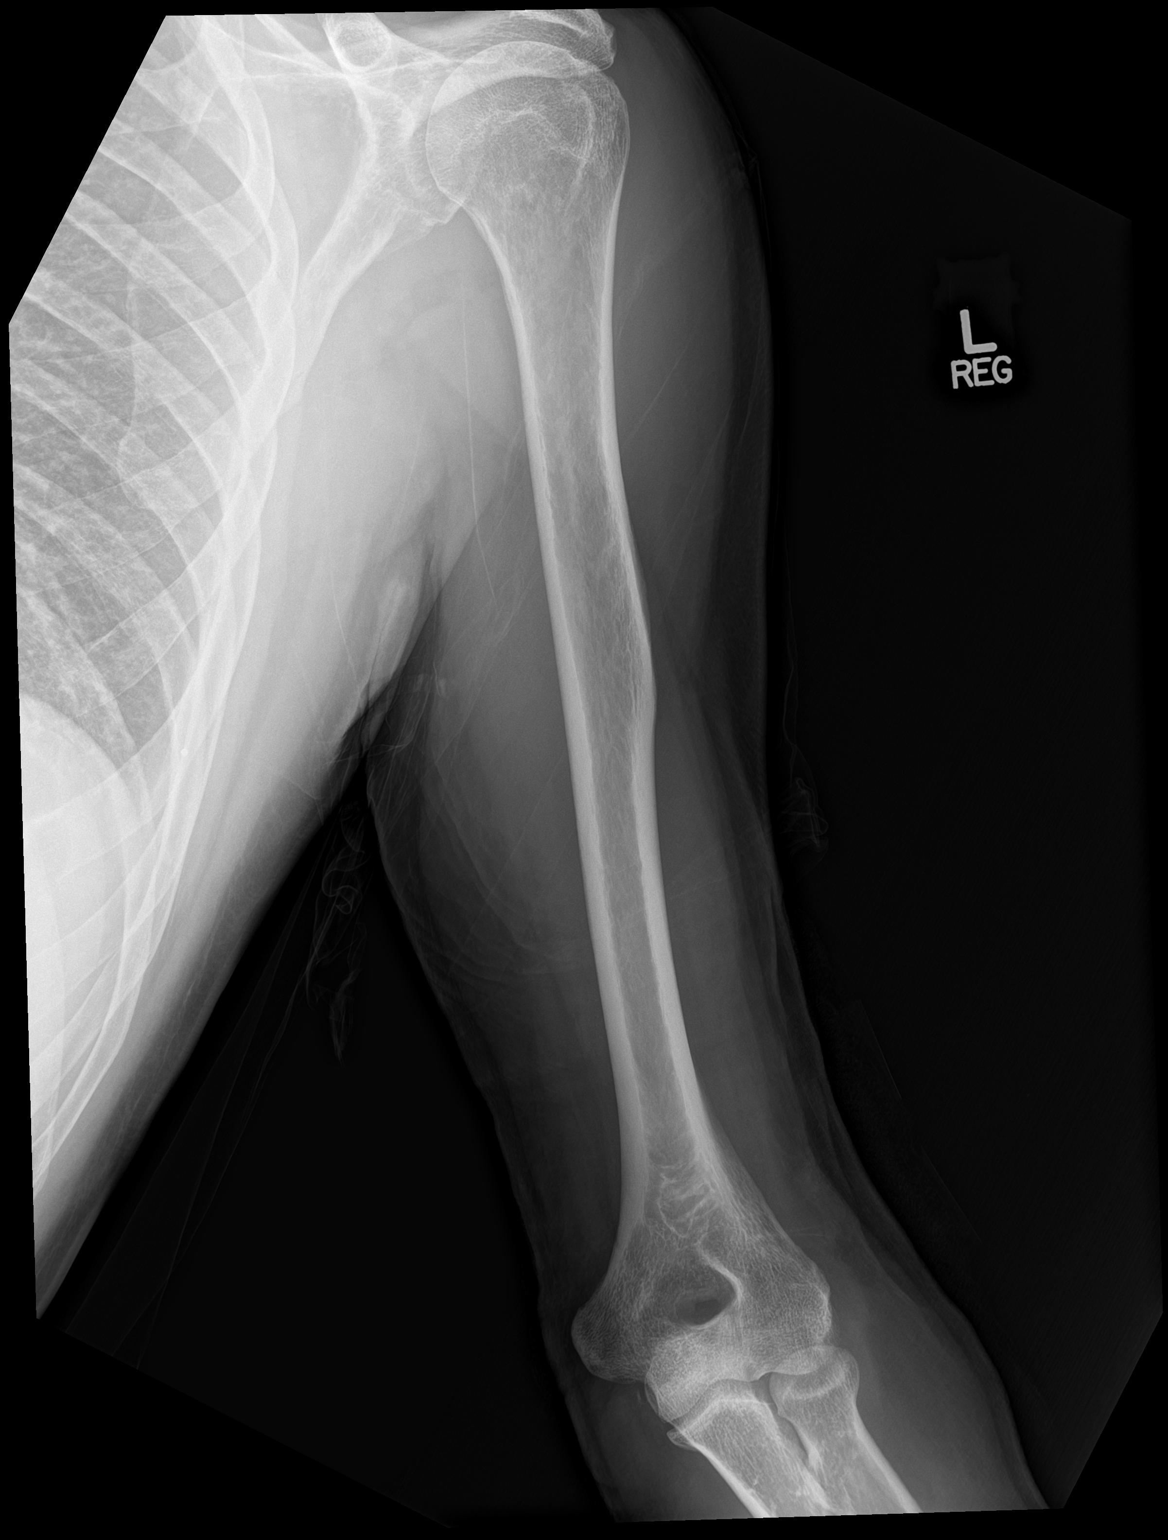

[humerus lat]
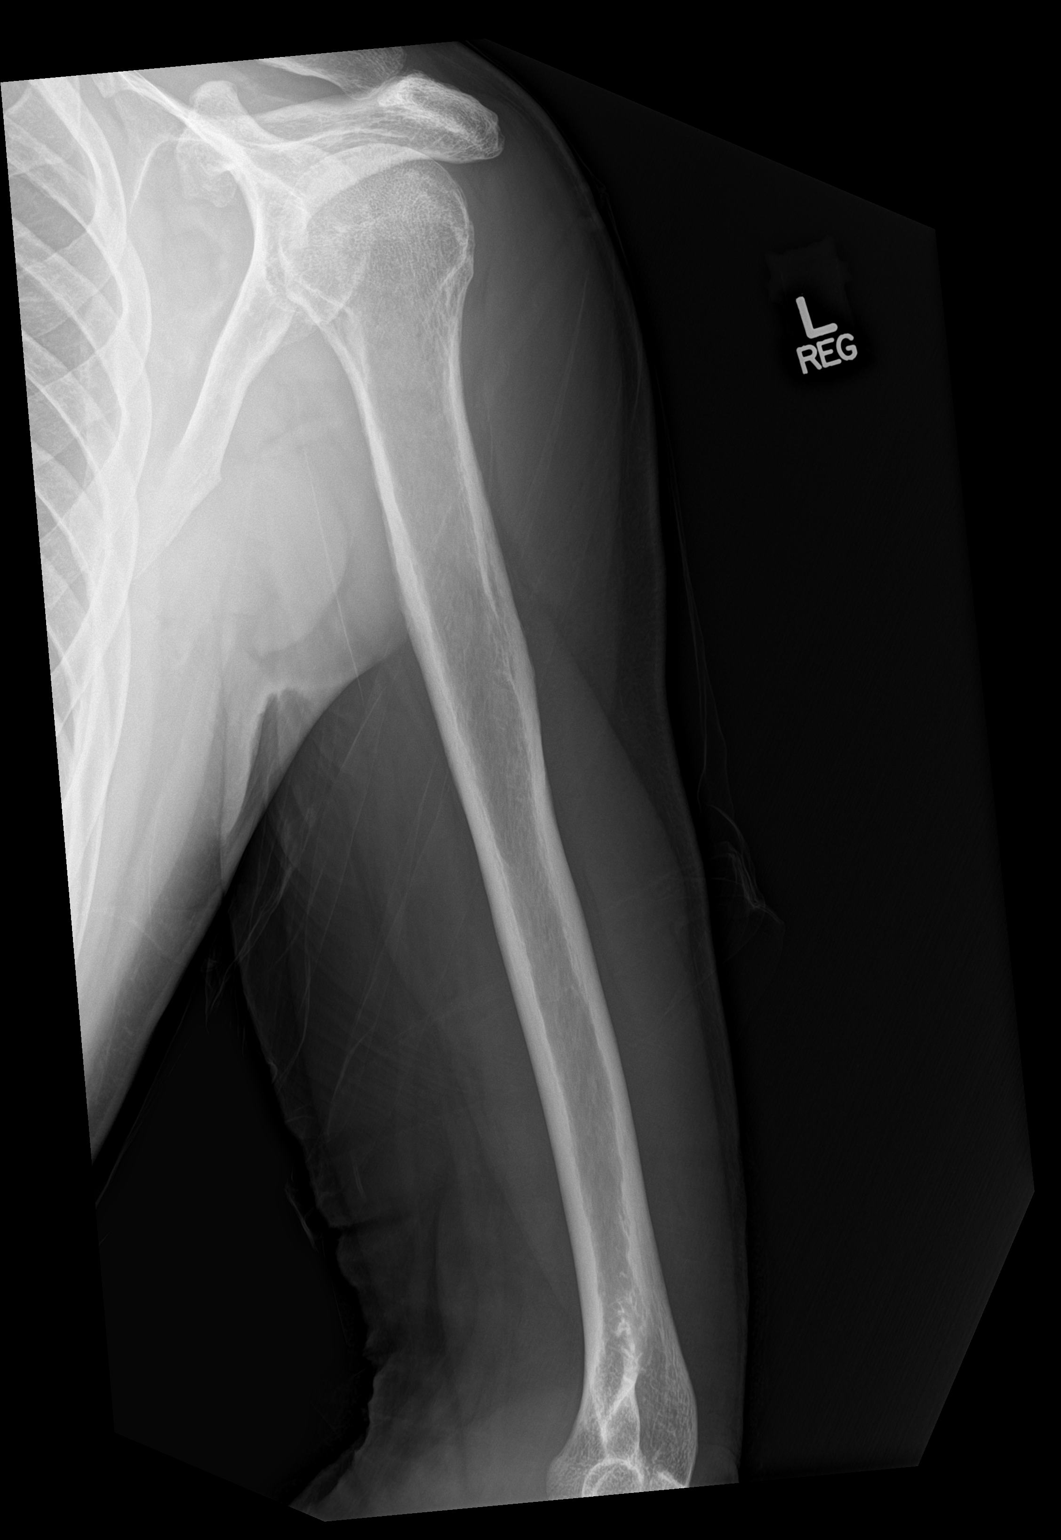

[2 of 2 positions shown; findings below may reference images not displayed]

FINDINGS: There is no evidence of fracture or other focal bone lesions. Soft
tissues are unremarkable.
IMPRESSION: Negative.

## 2021-11-02 IMAGING — DX DG RIBS W/ CHEST 3+V*L*
3 series · 3 of 3 positions shown · non-contrast
Comparison: None.

CLINICAL DATA: Fall down stairs.  Left rib pain

EXAM:
LEFT RIBS AND CHEST - 3+ VIEW

[chest pa]
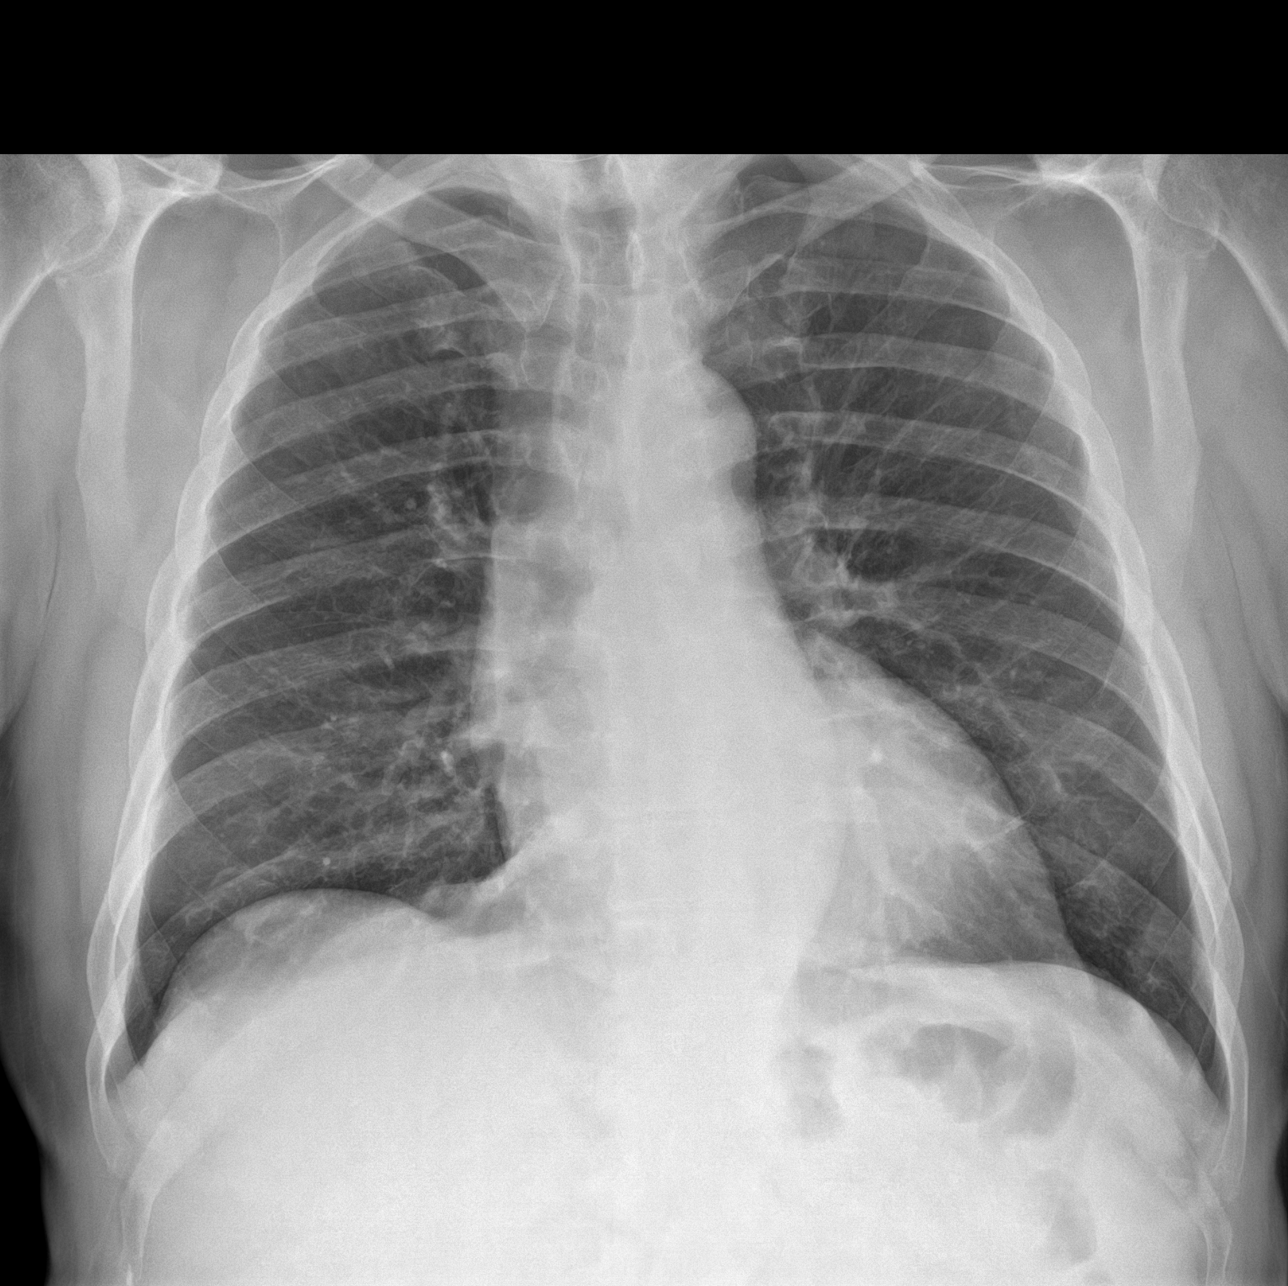

[rib ap]
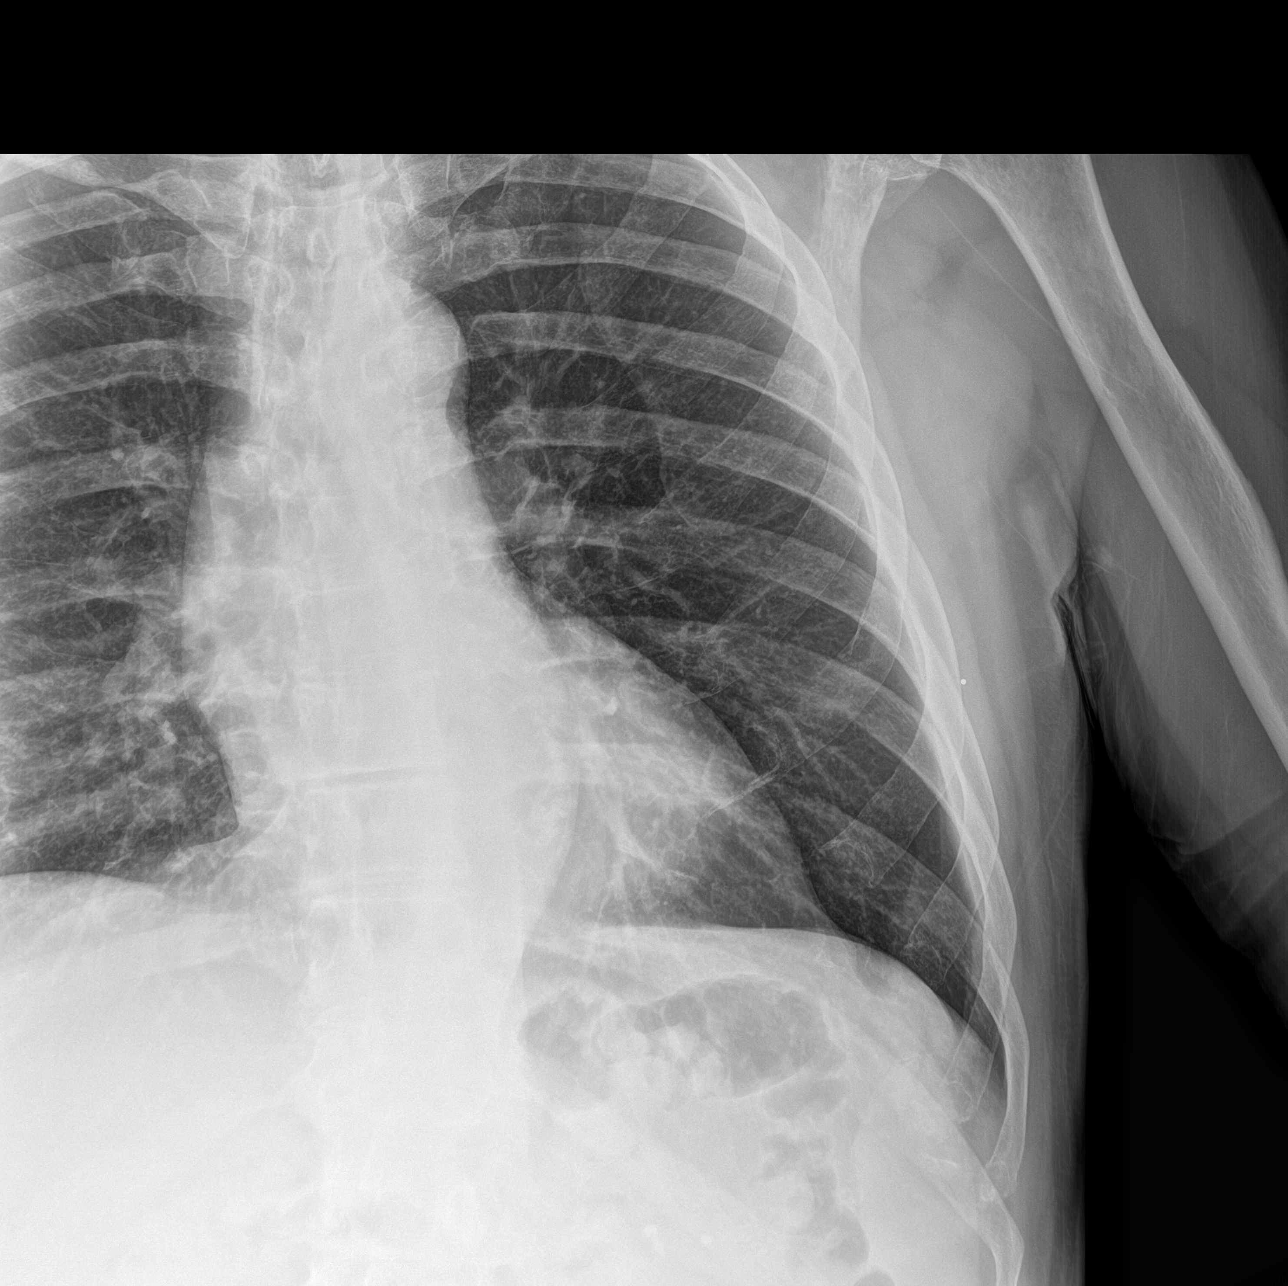

[rib obl]
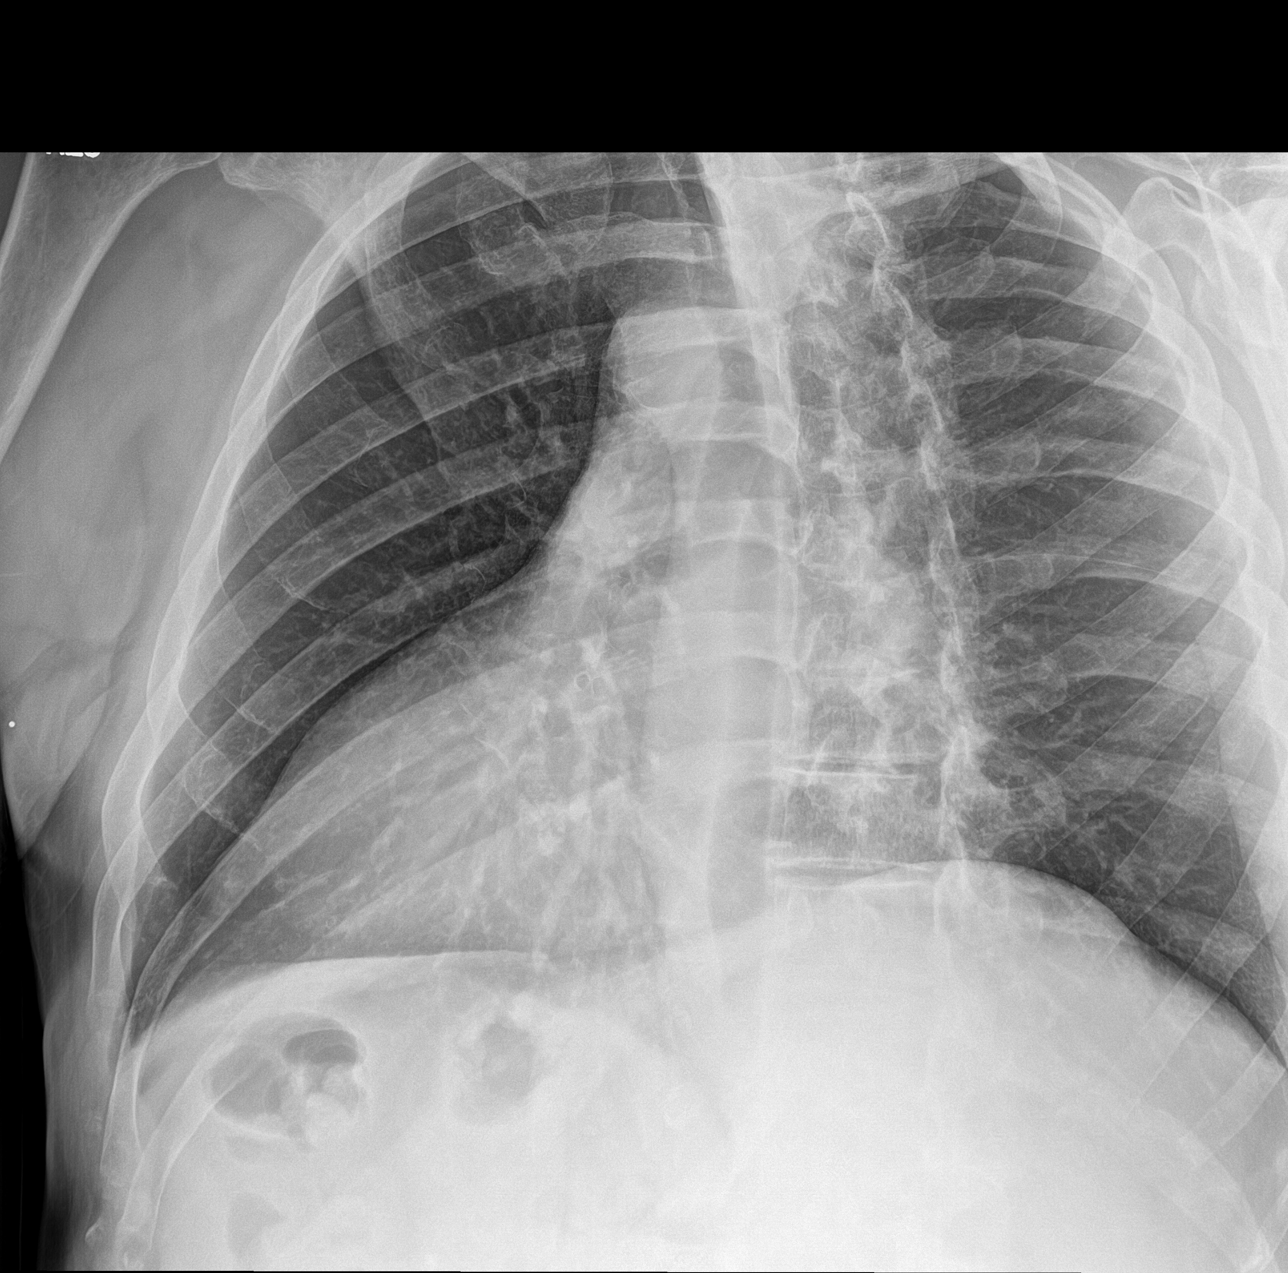

[3 of 3 positions shown; findings below may reference images not displayed]

FINDINGS: No fracture or other bone lesions are seen involving the ribs. There
is no evidence of pneumothorax or pleural effusion. Both lungs are
clear. Heart size and mediastinal contours are within normal limits.
IMPRESSION: Negative.

## 2021-11-07 ENCOUNTER — Other Ambulatory Visit (HOSPITAL_COMMUNITY): Payer: Self-pay | Admitting: Nurse Practitioner

## 2021-11-07 DIAGNOSIS — M75122 Complete rotator cuff tear or rupture of left shoulder, not specified as traumatic: Secondary | ICD-10-CM | POA: Diagnosis not present

## 2021-11-07 DIAGNOSIS — M25612 Stiffness of left shoulder, not elsewhere classified: Secondary | ICD-10-CM | POA: Diagnosis not present

## 2021-11-07 DIAGNOSIS — R531 Weakness: Secondary | ICD-10-CM | POA: Diagnosis not present

## 2021-11-14 DIAGNOSIS — M25612 Stiffness of left shoulder, not elsewhere classified: Secondary | ICD-10-CM | POA: Diagnosis not present

## 2021-11-14 DIAGNOSIS — M75122 Complete rotator cuff tear or rupture of left shoulder, not specified as traumatic: Secondary | ICD-10-CM | POA: Diagnosis not present

## 2021-11-14 DIAGNOSIS — R531 Weakness: Secondary | ICD-10-CM | POA: Diagnosis not present

## 2021-11-19 DIAGNOSIS — M25612 Stiffness of left shoulder, not elsewhere classified: Secondary | ICD-10-CM | POA: Diagnosis not present

## 2021-11-19 DIAGNOSIS — R531 Weakness: Secondary | ICD-10-CM | POA: Diagnosis not present

## 2021-11-19 DIAGNOSIS — M75122 Complete rotator cuff tear or rupture of left shoulder, not specified as traumatic: Secondary | ICD-10-CM | POA: Diagnosis not present

## 2021-11-21 DIAGNOSIS — M25612 Stiffness of left shoulder, not elsewhere classified: Secondary | ICD-10-CM | POA: Diagnosis not present

## 2021-11-21 DIAGNOSIS — M75122 Complete rotator cuff tear or rupture of left shoulder, not specified as traumatic: Secondary | ICD-10-CM | POA: Diagnosis not present

## 2021-11-21 DIAGNOSIS — R531 Weakness: Secondary | ICD-10-CM | POA: Diagnosis not present

## 2021-11-26 DIAGNOSIS — R531 Weakness: Secondary | ICD-10-CM | POA: Diagnosis not present

## 2021-11-26 DIAGNOSIS — M75122 Complete rotator cuff tear or rupture of left shoulder, not specified as traumatic: Secondary | ICD-10-CM | POA: Diagnosis not present

## 2021-11-26 DIAGNOSIS — M25612 Stiffness of left shoulder, not elsewhere classified: Secondary | ICD-10-CM | POA: Diagnosis not present

## 2021-11-28 DIAGNOSIS — R531 Weakness: Secondary | ICD-10-CM | POA: Diagnosis not present

## 2021-11-28 DIAGNOSIS — M75122 Complete rotator cuff tear or rupture of left shoulder, not specified as traumatic: Secondary | ICD-10-CM | POA: Diagnosis not present

## 2021-11-28 DIAGNOSIS — M25612 Stiffness of left shoulder, not elsewhere classified: Secondary | ICD-10-CM | POA: Diagnosis not present

## 2021-12-02 ENCOUNTER — Ambulatory Visit: Payer: Medicare HMO | Admitting: Cardiology

## 2021-12-02 ENCOUNTER — Encounter: Payer: Self-pay | Admitting: Cardiology

## 2021-12-02 VITALS — BP 118/70 | HR 54 | Ht 69.0 in | Wt 180.2 lb

## 2021-12-02 DIAGNOSIS — D6869 Other thrombophilia: Secondary | ICD-10-CM | POA: Diagnosis not present

## 2021-12-02 DIAGNOSIS — I480 Paroxysmal atrial fibrillation: Secondary | ICD-10-CM | POA: Diagnosis not present

## 2021-12-02 NOTE — Progress Notes (Signed)
Electrophysiology Office Note   Date:  12/02/2021   ID:  Jose Evans, DOB Sep 16, 1945, MRN [id number]  PCP:  [name], NP  Cardiologist:   Primary Electrophysiologist:  [name] [name], MD    Chief Complaint: AF, SVT   History of Present Illness: Jose Evans is a 76 y.o. male who is being seen today for the evaluation of AF, SVT at the request of [name], NP. Presenting today for electrophysiology evaluation.  He has a history significant for paroxysmal atrial fibrillation, diabetes, hypertension, hypothyroidism.  He wore a cardiac monitor with a 3% atrial fibrillation burden.  He also has atrial flutter.  He is minimally symptomatic from his atrial fibrillation, though would like to stay in normal rhythm.  Is been in the emergency room for his atrial fibrillation.  He was started on diltiazem.  Since being on diltiazem, he has not had no further episodes of atrial fibrillation.  He is feeling quite well.  Today, he denies symptoms of palpitations, chest pain, shortness of breath, orthopnea, PND, lower extremity edema, claudication, dizziness, presyncope, syncope, bleeding, or neurologic sequela. The patient is tolerating medications without difficulties.    Past Medical History:  Diagnosis Date   Adult acne    Cancer ([place]) 07/2017   skin cancer   Diabetes mellitus without complication ([place])    Dyspnea    Gout    Hypertension    Hypothyroidism    Thyroid disease    Past Surgical History:  Procedure Laterality Date   HERNIA REPAIR  2007   x2   SHOULDER SURGERY Right 2018   TONSILLECTOMY     age 13     Current Outpatient Medications  Medication Sig Dispense Refill   acetaminophen (TYLENOL) 500 MG tablet Take 1,000 mg by mouth every 6 (six) hours as needed for mild pain.     allopurinol (ZYLOPRIM) 300 MG tablet TAKE 1 TABLET BY MOUTH EVERY DAY (Patient taking differently: Take 300 mg by mouth daily.) 90 tablet 3   amphetamine-dextroamphetamine  (ADDERALL) 10 MG tablet Take 1 tablet (10 mg total) by mouth daily. (Patient taking differently: Take 5 mg by mouth daily.) 30 tablet 0   citalopram (CELEXA) 10 MG tablet Take 1 tablet (10 mg total) by mouth daily. 90 tablet 1   diltiazem (CARDIZEM SR) 120 MG 12 hr capsule TAKE 1 CAPSULE BY MOUTH EVERY DAY 90 capsule 2   finasteride (PROSCAR) 5 MG tablet Take 1 tablet (5 mg total) by mouth daily. 90 tablet 3   levothyroxine (SYNTHROID) 175 MCG tablet Take 1 tablet (175 mcg total) by mouth daily before breakfast. 90 tablet 1   lisinopril (ZESTRIL) 5 MG tablet Take 1 tablet (5 mg total) by mouth daily. 90 tablet 3   metFORMIN (GLUCOPHAGE) 500 MG tablet TAKE 1 TABLET BY MOUTH TWICE A DAY WITH MEALS (Patient taking differently: Take 250 mg by mouth 2 (two) times daily.) 180 tablet 0   metoprolol succinate (TOPROL-XL) 50 MG 24 hr tablet Take 1 tablet (50 mg total) by mouth daily. 90 tablet 1   traMADol (ULTRAM) 50 MG tablet Take 50 mg by mouth every 6 (six) hours as needed (pain).     traZODone (DESYREL) 50 MG tablet TAKE 1 TABLET BY MOUTH EVERY DAY AT BEDTIME AS NEEDED FOR SLEEP (Patient taking differently: Take 50 mg by mouth at bedtime as needed for sleep.) 90 tablet 1   triamterene-hydrochlorothiazide (MAXZIDE) 75-50 MG tablet Take 1 tablet by mouth daily. 90 tablet 3  XARELTO 20 MG TABS tablet TAKE 1 TABLET BY MOUTH DAILY WITH SUPPER. (Patient taking differently: Take 20 mg by mouth daily with supper.) 30 tablet 11   No current facility-administered medications for this visit.    Allergies:   Glucophage [metformin]   Social History:  The patient  reports that he quit smoking about 16 years ago. His smoking use included cigarettes. He has a 15.00 pack-year smoking history. He has never used smokeless tobacco. He reports that he does not currently use alcohol. He reports that he does not use drugs.   Family History:  The patient's family history includes Hypertension in an other family member.     ROS:  Please see the history of present illness.   Otherwise, review of systems is positive for none.   All other systems are reviewed and negative.    PHYSICAL EXAM: VS:  BP 118/70   Pulse (!) 54   Ht '5\' 9"'$  (1.753 m)   Wt 180 lb 3.2 oz (81.7 kg)   SpO2 95%   BMI 26.61 kg/m  , BMI Body mass index is 26.61 kg/m. GEN: Well nourished, well developed, in no acute distress  HEENT: normal  Neck: no JVD, carotid bruits, or masses Cardiac: RRR; no murmurs, rubs, or gallops,no edema  Respiratory:  clear to auscultation bilaterally, normal work of breathing GI: soft, nontender, nondistended, + BS MS: no deformity or atrophy  Skin: warm and dry Neuro:  Strength and sensation are intact Psych: euthymic mood, full affect  EKG:  EKG is ordered today. Personal review of the ekg ordered shows sinus rhythm, rate 54  Recent Labs: 02/28/2021: ALT 29 10/15/2021: BUN 34; Creatinine, Ser 1.11; Hemoglobin 14.5; Magnesium 2.1; Platelets 316; Potassium 3.8; Sodium 140; TSH 0.098    Lipid Panel     Component Value Date/Time   CHOL 155 02/28/2021 0833   TRIG 138.0 02/28/2021 0833   HDL 39.20 02/28/2021 0833   CHOLHDL 4 02/28/2021 0833   VLDL 27.6 02/28/2021 0833   LDLCALC 88 02/28/2021 0833   LDLCALC 122 (H) 01/26/2020 1338     Wt Readings from Last 3 Encounters:  12/02/21 180 lb 3.2 oz (81.7 kg)  10/16/21 180 lb 6.4 oz (81.8 kg)  10/15/21 176 lb (79.8 kg)      Other studies Reviewed: Additional studies/ records that were reviewed today include: TTE 04/17/21  Review of the above records today demonstrates:   1. Left ventricular ejection fraction, by estimation, is 60 to 65%. The  left ventricle has normal function. The left ventricle has no regional  wall motion abnormalities. Left ventricular diastolic parameters are  indeterminate. Elevated left ventricular  end-diastolic pressure. The E/e' is 17.8.   2. Right ventricular systolic function is normal. The right ventricular   size is normal. There is normal pulmonary artery systolic pressure. The  estimated right ventricular systolic pressure is 74.0 mmHg.   3. The mitral valve is grossly normal. Trivial mitral valve  regurgitation. No evidence of mitral stenosis.   4. The aortic valve is tricuspid. Aortic valve regurgitation is not  visualized. No aortic stenosis is present.   5. The inferior vena cava is normal in size with greater than 50%  respiratory variability, suggesting right atrial pressure of 3 mmHg.   Cardiac monitor 01/24/2021 personally reviewed 6.7% supraventricular ectopy Less than 1% ventricular ectopy 3% atrial fibrillation burden Multiple SVT episodes, longest 25 seconds Triggered episodes associated with both SVT and atrial fibrillation  ASSESSMENT AND PLAN:  1.  Paroxysmal atrial fibrillation/flutter: CHA2DS2-VASc of 3.  Currently on diltiazem 120 mg daily, Xarelto 20 mg daily.  Feels quite poorly in atrial fibrillation.  Despite that, he has had no further episodes since being on diltiazem.  He has a few other health issues that he would like to get taken care of prior to any procedures for his atrial fibrillation.  As he is feeling well on diltiazem, we Lorrine Killilea continue his current management.  2.  Secondary hypercoagulable state: Currently on Xarelto for atrial fibrillation as above  3.  Hypertension: Currently well controlled    Current medicines are reviewed at length with the patient today.   The patient does not have concerns regarding his medicines.  The following changes were made today:  none  Labs/ tests ordered today include:  Orders Placed This Encounter  Procedures   EKG 12-Lead     Disposition:   FU with Bernice Mcauliffe 6 months  Signed, Analeese Andreatta [name], MD  12/02/2021 9:29 AM     [address] [place] [address] [phone] (office) [phone] (fax)

## 2021-12-03 DIAGNOSIS — M75122 Complete rotator cuff tear or rupture of left shoulder, not specified as traumatic: Secondary | ICD-10-CM | POA: Diagnosis not present

## 2021-12-03 DIAGNOSIS — R531 Weakness: Secondary | ICD-10-CM | POA: Diagnosis not present

## 2021-12-03 DIAGNOSIS — M25612 Stiffness of left shoulder, not elsewhere classified: Secondary | ICD-10-CM | POA: Diagnosis not present

## 2021-12-06 ENCOUNTER — Other Ambulatory Visit: Payer: Self-pay | Admitting: Adult Health

## 2021-12-06 DIAGNOSIS — R7303 Prediabetes: Secondary | ICD-10-CM

## 2021-12-10 DIAGNOSIS — M75122 Complete rotator cuff tear or rupture of left shoulder, not specified as traumatic: Secondary | ICD-10-CM | POA: Diagnosis not present

## 2021-12-10 DIAGNOSIS — M25612 Stiffness of left shoulder, not elsewhere classified: Secondary | ICD-10-CM | POA: Diagnosis not present

## 2021-12-10 DIAGNOSIS — R531 Weakness: Secondary | ICD-10-CM | POA: Diagnosis not present

## 2021-12-17 DIAGNOSIS — M75122 Complete rotator cuff tear or rupture of left shoulder, not specified as traumatic: Secondary | ICD-10-CM | POA: Diagnosis not present

## 2021-12-17 DIAGNOSIS — M25612 Stiffness of left shoulder, not elsewhere classified: Secondary | ICD-10-CM | POA: Diagnosis not present

## 2021-12-17 DIAGNOSIS — R531 Weakness: Secondary | ICD-10-CM | POA: Diagnosis not present

## 2021-12-19 ENCOUNTER — Other Ambulatory Visit: Payer: Self-pay | Admitting: Adult Health

## 2021-12-19 DIAGNOSIS — M25612 Stiffness of left shoulder, not elsewhere classified: Secondary | ICD-10-CM | POA: Diagnosis not present

## 2021-12-19 DIAGNOSIS — F988 Other specified behavioral and emotional disorders with onset usually occurring in childhood and adolescence: Secondary | ICD-10-CM

## 2021-12-19 DIAGNOSIS — R531 Weakness: Secondary | ICD-10-CM | POA: Diagnosis not present

## 2021-12-19 DIAGNOSIS — M75122 Complete rotator cuff tear or rupture of left shoulder, not specified as traumatic: Secondary | ICD-10-CM | POA: Diagnosis not present

## 2021-12-20 ENCOUNTER — Telehealth: Payer: Self-pay | Admitting: Pharmacist

## 2021-12-20 NOTE — Chronic Care Management (AMB) (Signed)
Chronic Care Management Pharmacy Assistant   Name: Gaurav Baldree  MRN: [id number] DOB: 1945-06-10  Reason for Encounter: Disease State / Hypertension Assessment Call   Conditions to be addressed/monitored: HTN  Recent office visits:  None  Recent consult visits:  12/02/2021 [name] (cardiology) - Patient was seen for Paroxysmal atrial fibrillation and an additional concern. No medication changes. No follow up noted.   Hospital visits:  None  Medications: Outpatient Encounter Medications as of 12/20/2021  Medication Sig Note   acetaminophen (TYLENOL) 500 MG tablet Take 1,000 mg by mouth every 6 (six) hours as needed for mild pain.    allopurinol (ZYLOPRIM) 300 MG tablet TAKE 1 TABLET BY MOUTH EVERY DAY (Patient taking differently: Take 300 mg by mouth daily.)    amphetamine-dextroamphetamine (ADDERALL) 10 MG tablet Take 1 tablet (10 mg total) by mouth daily. (Patient taking differently: Take 5 mg by mouth daily.) 10/15/2021: Took 5 mg this morning (10/15/21)   citalopram (CELEXA) 10 MG tablet Take 1 tablet (10 mg total) by mouth daily.    diltiazem (CARDIZEM SR) 120 MG 12 hr capsule TAKE 1 CAPSULE BY MOUTH EVERY DAY    finasteride (PROSCAR) 5 MG tablet Take 1 tablet (5 mg total) by mouth daily.    levothyroxine (SYNTHROID) 175 MCG tablet Take 1 tablet (175 mcg total) by mouth daily before breakfast.    lisinopril (ZESTRIL) 5 MG tablet Take 1 tablet (5 mg total) by mouth daily.    metFORMIN (GLUCOPHAGE) 500 MG tablet TAKE 1 TABLET BY MOUTH TWICE A DAY WITH MEALS    metoprolol succinate (TOPROL-XL) 50 MG 24 hr tablet Take 1 tablet (50 mg total) by mouth daily.    traMADol (ULTRAM) 50 MG tablet Take 50 mg by mouth every 6 (six) hours as needed (pain).    traZODone (DESYREL) 50 MG tablet TAKE 1 TABLET BY MOUTH EVERY DAY AT BEDTIME AS NEEDED FOR SLEEP (Patient taking differently: Take 50 mg by mouth at bedtime as needed for sleep.)    triamterene-hydrochlorothiazide (MAXZIDE) 75-50  MG tablet Take 1 tablet by mouth daily.    XARELTO 20 MG TABS tablet TAKE 1 TABLET BY MOUTH DAILY WITH SUPPER. (Patient taking differently: Take 20 mg by mouth daily with supper.)    No facility-administered encounter medications on file as of 12/20/2021.  Fill History: TRIAMT/HCTZ 75-'50MG'$  TAB 12/19/2021 90   TRAZODONE 50 MG TABLET 11/07/2021 90   TRAMADOL HCL 50 MG TABLET 11/24/2021 11   METOPROLOL TARTRATE 25 MG TAB 02/20/2021 90   METFORMIN '500MG'$  TAB 12/06/2021 90   LISINOPRIL 5 MG TABLET 11/06/2021 90   LEVOTHYROXINE 175 MCG TABLET 11/06/2021 90   FINASTERIDE 5 MG TABLET 11/17/2021 90   DILTIAZEM 12HR ER 120 MG CAP 11/08/2021 90   CITALOPRAM HBR 10 MG TABLET 10/03/2021 90   DEXTROAMP-AMPHETAMIN 10 MG TAB 11/06/2021 30    Reviewed chart prior to disease state call. Spoke with patient regarding BP  Recent Office Vitals: BP Readings from Last 3 Encounters:  12/02/21 118/70  10/16/21 110/76  10/15/21 105/85   Pulse Readings from Last 3 Encounters:  12/02/21 (!) 54  10/16/21 (!) 121  10/15/21 81    Wt Readings from Last 3 Encounters:  12/02/21 180 lb 3.2 oz (81.7 kg)  10/16/21 180 lb 6.4 oz (81.8 kg)  10/15/21 176 lb (79.8 kg)     Kidney Function Lab Results  Component Value Date/Time   CREATININE 1.11 10/15/2021 11:00 AM   CREATININE 1.32 10/03/2021 02:11 PM  CREATININE 1.14 01/26/2020 01:38 PM   GFR 53.34 (L) 10/03/2021 02:11 PM   GFRNONAA >60 10/15/2021 11:00 AM   GFRNONAA 64 01/26/2020 01:38 PM   GFRAA 74 01/26/2020 01:38 PM       Latest Ref Rng & Units 10/15/2021   11:00 AM 10/03/2021    2:11 PM 05/27/2021   12:16 PM  BMP  Glucose 70 - 99 mg/dL 150  178  135   BUN 8 - 23 mg/dL 34  37  19   Creatinine 0.61 - 1.24 mg/dL 1.11  1.32  0.92   Sodium 135 - 145 mmol/L 140  134  141   Potassium 3.5 - 5.1 mmol/L 3.8  3.9  3.9   Chloride 98 - 111 mmol/L 110  96  101   CO2 22 - 32 mmol/L '23  29  26   '$ Calcium 8.9 - 10.3 mg/dL 9.6  10.6  9.8    Current  antihypertensive regimen:  Diltiazem 120 mg daily Lisinopril 5 mg daily Metoprolol 50 mg daily Triamterene HCTZ 75/50 mg daily  How often are you checking your Blood Pressure?   Current home BP readings:   What recent interventions/DTPs have been made by any provider to improve Blood Pressure control since last CPP Visit: No recent interventions  Any recent hospitalizations or ED visits since last visit with CPP? No recent hospital visits  What diet changes have been made to improve Blood Pressure Control?  Diet followed: Breakfast - patient will have Lunch - patient will have [place] - patient will have  What exercise is being done to improve your Blood Pressure Control?    Adherence Review: Is the patient currently on ACE/ARB medication? Yes Does the patient have >5 day gap between last estimated fill dates? No  Unable to reach patient after several attempts.  Care Gaps: AWV - message sent on 12/20/21 to Ramond Craver Last BP - 118/70 on 12/02/2021 Last A1C - 6.1 on 10/03/2021 Shingrix - never done Colonoscopy - never done Covid booster - overdue Tdap - overdue Flu - due  Star Rating Drugs: Lisinopril 5 mg - last filled 11/06/2021 90 DS at [facility] Metformin 500 mg - last filled 12/06/2021 90 DS at [facility] Pharmacist Assistant [phone]

## 2021-12-23 ENCOUNTER — Encounter: Payer: Self-pay | Admitting: Adult Health

## 2021-12-24 DIAGNOSIS — R531 Weakness: Secondary | ICD-10-CM | POA: Diagnosis not present

## 2021-12-24 DIAGNOSIS — M75122 Complete rotator cuff tear or rupture of left shoulder, not specified as traumatic: Secondary | ICD-10-CM | POA: Diagnosis not present

## 2021-12-24 DIAGNOSIS — M25612 Stiffness of left shoulder, not elsewhere classified: Secondary | ICD-10-CM | POA: Diagnosis not present

## 2021-12-25 ENCOUNTER — Other Ambulatory Visit: Payer: Self-pay | Admitting: Adult Health

## 2021-12-25 DIAGNOSIS — F988 Other specified behavioral and emotional disorders with onset usually occurring in childhood and adolescence: Secondary | ICD-10-CM

## 2021-12-25 MED ORDER — AMPHETAMINE-DEXTROAMPHETAMINE 10 MG PO TABS
10.0000 mg | ORAL_TABLET | Freq: Every day | ORAL | 0 refills | Status: DC
Start: 2021-12-25 — End: 2022-03-28

## 2021-12-25 MED ORDER — TRAMADOL HCL 50 MG PO TABS: 50.0000 mg | ORAL_TABLET | Freq: Four times a day (QID) | ORAL | 1 refills | Status: DC | PRN

## 2021-12-25 MED ORDER — AMPHETAMINE-DEXTROAMPHETAMINE 10 MG PO TABS: 10.0000 mg | ORAL_TABLET | Freq: Every day | ORAL | 0 refills | Status: DC

## 2021-12-25 NOTE — Telephone Encounter (Signed)
Last OV 10/03/21 Next OV: none scheduled 

## 2021-12-26 DIAGNOSIS — R531 Weakness: Secondary | ICD-10-CM | POA: Diagnosis not present

## 2021-12-26 DIAGNOSIS — M25612 Stiffness of left shoulder, not elsewhere classified: Secondary | ICD-10-CM | POA: Diagnosis not present

## 2021-12-26 DIAGNOSIS — M75122 Complete rotator cuff tear or rupture of left shoulder, not specified as traumatic: Secondary | ICD-10-CM | POA: Diagnosis not present

## 2021-12-31 DIAGNOSIS — M25612 Stiffness of left shoulder, not elsewhere classified: Secondary | ICD-10-CM | POA: Diagnosis not present

## 2021-12-31 DIAGNOSIS — R531 Weakness: Secondary | ICD-10-CM | POA: Diagnosis not present

## 2021-12-31 DIAGNOSIS — M75122 Complete rotator cuff tear or rupture of left shoulder, not specified as traumatic: Secondary | ICD-10-CM | POA: Diagnosis not present

## 2022-01-02 DIAGNOSIS — M75122 Complete rotator cuff tear or rupture of left shoulder, not specified as traumatic: Secondary | ICD-10-CM | POA: Diagnosis not present

## 2022-01-02 DIAGNOSIS — M25612 Stiffness of left shoulder, not elsewhere classified: Secondary | ICD-10-CM | POA: Diagnosis not present

## 2022-01-02 DIAGNOSIS — R531 Weakness: Secondary | ICD-10-CM | POA: Diagnosis not present

## 2022-01-06 DIAGNOSIS — M25512 Pain in left shoulder: Secondary | ICD-10-CM | POA: Diagnosis not present

## 2022-01-06 DIAGNOSIS — M75122 Complete rotator cuff tear or rupture of left shoulder, not specified as traumatic: Secondary | ICD-10-CM | POA: Diagnosis not present

## 2022-01-09 ENCOUNTER — Telehealth: Payer: Self-pay | Admitting: Adult Health

## 2022-01-09 DIAGNOSIS — M75122 Complete rotator cuff tear or rupture of left shoulder, not specified as traumatic: Secondary | ICD-10-CM | POA: Diagnosis not present

## 2022-01-09 DIAGNOSIS — M25612 Stiffness of left shoulder, not elsewhere classified: Secondary | ICD-10-CM | POA: Diagnosis not present

## 2022-01-09 DIAGNOSIS — R531 Weakness: Secondary | ICD-10-CM | POA: Diagnosis not present

## 2022-01-09 NOTE — Telephone Encounter (Signed)
Spoke to patient to schedule AWV    Patient stated Pharmacist told him adderall and celexa should not be taken together.  Patient stated he stopped taking addreall   he stated he is paring down his clexea starting this week.  He stated he is taking one every other day.    If he had to choose which med not to take it is the clexea instead of adderall

## 2022-01-10 NOTE — Telephone Encounter (Signed)
Patient notified of update  and verbalized understanding. 

## 2022-01-14 DIAGNOSIS — M25612 Stiffness of left shoulder, not elsewhere classified: Secondary | ICD-10-CM | POA: Diagnosis not present

## 2022-01-14 DIAGNOSIS — M75122 Complete rotator cuff tear or rupture of left shoulder, not specified as traumatic: Secondary | ICD-10-CM | POA: Diagnosis not present

## 2022-01-14 DIAGNOSIS — R531 Weakness: Secondary | ICD-10-CM | POA: Diagnosis not present

## 2022-01-16 DIAGNOSIS — M25612 Stiffness of left shoulder, not elsewhere classified: Secondary | ICD-10-CM | POA: Diagnosis not present

## 2022-01-16 DIAGNOSIS — R531 Weakness: Secondary | ICD-10-CM | POA: Diagnosis not present

## 2022-01-16 DIAGNOSIS — M75122 Complete rotator cuff tear or rupture of left shoulder, not specified as traumatic: Secondary | ICD-10-CM | POA: Diagnosis not present

## 2022-01-20 ENCOUNTER — Telehealth: Payer: Self-pay

## 2022-01-20 NOTE — Telephone Encounter (Signed)
Contacted patient on preferred number listed in notes for scheduled AWV. Patient stated unable to completed visit today will call back to reschedule

## 2022-01-25 ENCOUNTER — Other Ambulatory Visit: Payer: Self-pay | Admitting: Adult Health

## 2022-01-25 DIAGNOSIS — I10 Essential (primary) hypertension: Secondary | ICD-10-CM

## 2022-01-25 DIAGNOSIS — G8929 Other chronic pain: Secondary | ICD-10-CM

## 2022-01-25 DIAGNOSIS — E039 Hypothyroidism, unspecified: Secondary | ICD-10-CM

## 2022-01-25 DIAGNOSIS — G479 Sleep disorder, unspecified: Secondary | ICD-10-CM

## 2022-02-01 ENCOUNTER — Other Ambulatory Visit: Payer: Self-pay | Admitting: Adult Health

## 2022-02-01 DIAGNOSIS — N401 Enlarged prostate with lower urinary tract symptoms: Secondary | ICD-10-CM

## 2022-02-03 ENCOUNTER — Telehealth: Payer: Self-pay | Admitting: Adult Health

## 2022-02-03 NOTE — Telephone Encounter (Signed)
Left message for patient to call back and schedule Medicare Annual Wellness Visit (AWV) either virtually or in office. Left  my jabber number [phone]   Last AWV 01/17/21 please schedule with Nurse Health Adviser   45 min for awv-i and in office appointments 30 min for awv-s  phone/virtual appointments  

## 2022-02-06 ENCOUNTER — Other Ambulatory Visit: Payer: Self-pay | Admitting: Adult Health

## 2022-02-06 NOTE — Telephone Encounter (Signed)
Is pt suppose to take this daily? If so, okay to fill?

## 2022-02-06 NOTE — Telephone Encounter (Signed)
Noted  

## 2022-02-21 ENCOUNTER — Other Ambulatory Visit (HOSPITAL_COMMUNITY): Payer: Self-pay | Admitting: Nurse Practitioner

## 2022-02-27 ENCOUNTER — Other Ambulatory Visit: Payer: Self-pay | Admitting: Adult Health

## 2022-02-27 DIAGNOSIS — R7303 Prediabetes: Secondary | ICD-10-CM

## 2022-03-04 ENCOUNTER — Telehealth: Payer: Self-pay | Admitting: Adult Health

## 2022-03-04 NOTE — Telephone Encounter (Signed)
Left message for patient to call back and schedule Medicare Annual Wellness Visit (AWV) either virtually or in office. Left  my Herbie Drape number [phone]   Last AWV 01/17/21 please schedule with Nurse Health Adviser   45 min for awv-i and in office appointments 30 min for awv-s  phone/virtual appointments

## 2022-03-11 DIAGNOSIS — K118 Other diseases of salivary glands: Secondary | ICD-10-CM | POA: Diagnosis not present

## 2022-03-12 ENCOUNTER — Other Ambulatory Visit (HOSPITAL_COMMUNITY): Payer: Self-pay | Admitting: Otolaryngology

## 2022-03-12 DIAGNOSIS — K118 Other diseases of salivary glands: Secondary | ICD-10-CM

## 2022-03-23 ENCOUNTER — Other Ambulatory Visit: Payer: Self-pay | Admitting: Adult Health

## 2022-03-23 DIAGNOSIS — I10 Essential (primary) hypertension: Secondary | ICD-10-CM

## 2022-03-28 ENCOUNTER — Telehealth: Payer: Self-pay

## 2022-03-28 ENCOUNTER — Other Ambulatory Visit: Payer: Self-pay | Admitting: Adult Health

## 2022-03-28 ENCOUNTER — Ambulatory Visit (INDEPENDENT_AMBULATORY_CARE_PROVIDER_SITE_OTHER): Payer: Medicare HMO

## 2022-03-28 VITALS — Ht 69.0 in | Wt 175.0 lb

## 2022-03-28 DIAGNOSIS — Z0000 Encounter for general adult medical examination without abnormal findings: Secondary | ICD-10-CM

## 2022-03-28 DIAGNOSIS — F988 Other specified behavioral and emotional disorders with onset usually occurring in childhood and adolescence: Secondary | ICD-10-CM

## 2022-03-28 MED ORDER — AMPHETAMINE-DEXTROAMPHETAMINE 10 MG PO TABS
10.0000 mg | ORAL_TABLET | Freq: Every day | ORAL | 0 refills | Status: DC
Start: 2022-03-28 — End: 2022-10-10

## 2022-03-28 MED ORDER — AMPHETAMINE-DEXTROAMPHETAMINE 10 MG PO TABS: 10.0000 mg | ORAL_TABLET | Freq: Every day | ORAL | 0 refills | Status: DC

## 2022-03-28 NOTE — Patient Instructions (Addendum)
Jose Evans , Thank you for taking time to come for your Medicare Wellness Visit. I appreciate your ongoing commitment to your health goals. Please review the following plan we discussed and let me know if I can assist you in the future.   These are the goals we discussed:  Goals       Develop a Weight Loss Readiness Plan      Timeframe:  Long-Range Goal Priority:  Low Start Date:                             Expected End Date:                       Follow Up Date 01/17/2022   - begin a weight loss diary    Why is this important?   Losing weight requires time to prepare your mind and your home.  Identify your eating habits and the emotions attached to eating.  Think about ways to be successful.  When you are not ready, you could lose motivation and give up on your plan.     Notes:       Stay Healthy (pt-stated)      Finish projects around the house      Track and Manage My Blood Pressure-Hypertension      Timeframe:  Long-Range Goal Priority:  [employer] Start Date:                             Expected End Date:                       Follow Up Date 04/19/21    - check blood pressure weekly - choose a place to take my blood pressure (home, clinic or office, retail store) - write blood pressure results in a log or diary    Why is this important?   You won't feel high blood pressure, but it can still hurt your blood vessels.  High blood pressure can cause heart or kidney problems. It can also cause a stroke.  Making lifestyle changes like losing a little weight or eating less salt will help.  Checking your blood pressure at home and at different times of the day can help to control blood pressure.  If the doctor prescribes medicine remember to take it the way the doctor ordered.  Call the office if you cannot afford the medicine or if there are questions about it.     Notes:         This is a list of the screening recommended for you and due dates:  Health Maintenance   Topic Date Due   Yearly kidney health urinalysis for diabetes  Never done   DTaP/Tdap/Td vaccine (2 - Td or Tdap) 06/15/2021   COVID-19 Vaccine (5 - 2023-24 season) 04/13/2022*   Zoster (Shingles) Vaccine (1 of 2) 06/27/2022*   Flu Shot  07/20/2022*   Colon Cancer Screening  03/29/2023*   Yearly kidney function blood test for diabetes  10/16/2022   Medicare Annual Wellness Visit  03/29/2023   Pneumonia Vaccine  Completed   Hepatitis C Screening: USPSTF Recommendation to screen - Ages 18-79 yo.  Completed   HPV Vaccine  Aged Out  *Topic was postponed. The date shown is not the original due date.   Opioid Pain Medicine Management Opioids are powerful medicines that  are used to treat moderate to severe pain. When used for short periods of time, they can help you to: Sleep better. Do better in physical or occupational therapy. Feel better in the first few days after an injury. Recover from surgery. Opioids should be taken with the supervision of a trained health care provider. They should be taken for the shortest period of time possible. This is because opioids can be addictive, and the longer you take opioids, the greater your risk of addiction. This addiction can also be called opioid use disorder. What are the risks? Using opioid pain medicines for longer than 3 days increases your risk of side effects. Side effects include: Constipation. Nausea and vomiting. Breathing difficulties (respiratory depression). Drowsiness. Confusion. Opioid use disorder. Itching. Taking opioid pain medicine for a long period of time can affect your ability to do daily tasks. It also puts you at risk for: Motor vehicle crashes. Depression. Suicide. Heart attack. Overdose, which can be life-threatening. What is a pain treatment plan? A pain treatment plan is an agreement between you and your health care provider. Pain is unique to each person, and treatments vary depending on your condition. To  manage your pain, you and your health care provider need to work together. To help you do this: Discuss the goals of your treatment, including how much pain you might expect to have and how you will manage the pain. Review the risks and benefits of taking opioid medicines. Remember that a good treatment plan uses more than one approach and minimizes the chance of side effects. Be honest about the amount of medicines you take and about any drug or alcohol use. Get pain medicine prescriptions from only one health care provider. Pain can be managed with many types of alternative treatments. Ask your health care provider to refer you to one or more specialists who can help you manage pain through: Physical or occupational therapy. Counseling (cognitive behavioral therapy). Good nutrition. Biofeedback. Massage. Meditation. Non-opioid medicine. Following a gentle exercise program. How to use opioid pain medicine Taking medicine Take your pain medicine exactly as told by your health care provider. Take it only when you need it. If your pain gets less severe, you may take less than your prescribed dose if your health care provider approves. If you are not having pain, do nottake pain medicine unless your health care provider tells you to take it. If your pain is severe, do nottry to treat it yourself by taking more pills than instructed on your prescription. Contact your health care provider for help. Write down the times when you take your pain medicine. It is easy to become confused while on pain medicine. Writing the time can help you avoid overdose. Take other over-the-counter or prescription medicines only as told by your health care provider. Keeping yourself and others safe  While you are taking opioid pain medicine: Do not drive, use machinery, or power tools. Do not sign legal documents. Do not drink alcohol. Do not take sleeping pills. Do not supervise children by yourself. Do not do  activities that require climbing or being in high places. Do not go to a lake, river, ocean, spa, or swimming pool. Do not share your pain medicine with anyone. Keep pain medicine in a locked cabinet or in a secure area where pets and children cannot reach it. Stopping your use of opioids If you have been taking opioid medicine for more than a few weeks, you may need to slowly decrease (taper) how  much you take until you stop completely. Tapering your use of opioids can decrease your risk of symptoms of withdrawal, such as: Pain and cramping in the abdomen. Nausea. Sweating. Sleepiness. Restlessness. Uncontrollable shaking (tremors). Cravings for the medicine. Do not attempt to taper your use of opioids on your own. Talk with your health care provider about how to do this. Your health care provider may prescribe a step-down schedule based on how much medicine you are taking and how long you have been taking it. Getting rid of leftover pills Do not save any leftover pills. Get rid of leftover pills safely by: Taking the medicine to a prescription take-back program. This is usually offered by the county or law enforcement. Bringing them to a pharmacy that has a drug disposal container. Flushing them down the toilet. Check the label or package insert of your medicine to see whether this is safe to do. Throwing them out in the trash. Check the label or package insert of your medicine to see whether this is safe to do. If it is safe to throw it out, remove the medicine from the original container, put it into a sealable bag or container, and mix it with used coffee grounds, food scraps, dirt, or cat litter before putting it in the trash. Follow these instructions at home: Activity Do exercises as told by your health care provider. Avoid activities that make your pain worse. Return to your normal activities as told by your health care provider. Ask your health care provider what activities are  safe for you. General instructions You may need to take these actions to prevent or treat constipation: Drink enough fluid to keep your urine pale yellow. Take over-the-counter or prescription medicines. Eat foods that are high in fiber, such as beans, whole grains, and fresh fruits and vegetables. Limit foods that are high in fat and processed sugars, such as fried or sweet foods. Keep all follow-up visits. This is important. Where to find support If you have been taking opioids for a long time, you may benefit from receiving support for quitting from a local support group or counselor. Ask your health care provider for a referral to these resources in your area. Where to find more information Centers for Disease Control and Prevention (CDC): http://www.wolf.info/ [place] Food and Drug Administration (FDA): [employer] Get help right away if: You may have taken too much of an opioid (overdosed). Common symptoms of an overdose: Your breathing is slower or more shallow than normal. You have a very slow heartbeat (pulse). You have slurred speech. You have nausea and vomiting. Your pupils become very small. You have other potential symptoms: You are very confused. You faint or feel like you will faint. You have cold, clammy skin. You have blue lips or fingernails. You have thoughts of harming yourself or harming others. These symptoms may represent a serious problem that is an emergency. Do not wait to see if the symptoms will go away. Get medical help right away. Call your local emergency services (911 in the [place] Do not drive yourself to the hospital.  If you ever feel like you may hurt yourself or others, or have thoughts about taking your own life, get help right away. Go to your nearest emergency department or: Call your local emergency services (911 in the [place] Call the [facility] (872)072-4403 in the [place] Call a suicide crisis helpline, such as the [place] at 317-029-1976 or 988 in the [place]. This is  open 24 hours a day in the [place] Text the Crisis Text Line at 848-144-8427 (in the [place].). Summary Opioid medicines can help you manage moderate to severe pain for a short period of time. A pain treatment plan is an agreement between you and your health care provider. Discuss the goals of your treatment, including how much pain you might expect to have and how you will manage the pain. If you think that you or someone else may have taken too much of an opioid, get medical help right away. This information is not intended to replace advice given to you by your health care provider. Make sure you discuss any questions you have with your health care provider. Document Revised: 10/31/2020 Document Reviewed: 07/18/2020 Elsevier Patient Education  [place] directives: Please bring a copy of your health care power of attorney and living will to the office to be added to your chart at your convenience.   Conditions/risks identified: None  Next appointment: Follow up in one year for your annual wellness visit.    Preventive Care 39 Years and Older, Male  Preventive care refers to lifestyle choices and visits with your health care provider that can promote health and wellness. What does preventive care include? A yearly physical exam. This is also called an annual well check. Dental exams once or twice a year. Routine eye exams. Ask your health care provider how often you should have your eyes checked. Personal lifestyle choices, including: Daily care of your teeth and gums. Regular physical activity. Eating a healthy diet. Avoiding tobacco and drug use. Limiting alcohol use. Practicing safe sex. Taking low doses of aspirin every day. Taking vitamin and mineral supplements as recommended by your health care provider. What happens during an annual well check? The services and screenings done by your health care  provider during your annual well check will depend on your age, overall health, lifestyle risk factors, and family history of disease. Counseling  Your health care provider may ask you questions about your: Alcohol use. Tobacco use. Drug use. Emotional well-being. Home and relationship well-being. Sexual activity. Eating habits. History of falls. Memory and ability to understand (cognition). Work and work Statistician. Screening  You may have the following tests or measurements: Height, weight, and BMI. Blood pressure. Lipid and cholesterol levels. These may be checked every 5 years, or more frequently if you are over 57 years old. Skin check. Lung cancer screening. You may have this screening every year starting at age 21 if you have a 30-pack-year history of smoking and currently smoke or have quit within the past 15 years. Fecal occult blood test (FOBT) of the stool. You may have this test every year starting at age 33. Flexible sigmoidoscopy or colonoscopy. You may have a sigmoidoscopy every 5 years or a colonoscopy every 10 years starting at age 45. Prostate cancer screening. Recommendations will vary depending on your family history and other risks. Hepatitis C blood test. Hepatitis B blood test. Sexually transmitted disease (STD) testing. Diabetes screening. This is done by checking your blood sugar (glucose) after you have not eaten for a while (fasting). You may have this done every 1-3 years. Abdominal aortic aneurysm (AAA) screening. You may need this if you are a current or former smoker. Osteoporosis. You may be screened starting at age 91 if you are at high risk. Talk with your health care provider about your test results, treatment options, and if necessary, the need for more tests. Vaccines  Your health care provider may recommend certain vaccines, such as: Influenza vaccine. This is recommended every year. Tetanus, diphtheria, and acellular pertussis (Tdap, Td)  vaccine. You may need a Td booster every 10 years. Zoster vaccine. You may need this after age 46. Pneumococcal 13-valent conjugate (PCV13) vaccine. One dose is recommended after age 78. Pneumococcal polysaccharide (PPSV23) vaccine. One dose is recommended after age 88. Talk to your health care provider about which screenings and vaccines you need and how often you need them. This information is not intended to replace advice given to you by your health care provider. Make sure you discuss any questions you have with your health care provider. Document Released: 05/04/2015 Document Revised: 12/26/2015 Document Reviewed: 02/06/2015 Elsevier Interactive Patient Education  2017 [place] Prevention in the Home Falls can cause injuries. They can happen to people of all ages. There are many things you can do to make your home safe and to help prevent falls. What can I do on the outside of my home? Regularly fix the edges of walkways and driveways and fix any cracks. Remove anything that might make you trip as you walk through a door, such as a raised step or threshold. Trim any bushes or trees on the path to your home. Use bright outdoor lighting. Clear any walking paths of anything that might make someone trip, such as rocks or tools. Regularly check to see if handrails are loose or broken. Make sure that both sides of any steps have handrails. Any raised decks and porches should have guardrails on the edges. Have any leaves, snow, or ice cleared regularly. Use sand or salt on walking paths during winter. Clean up any spills in your garage right away. This includes oil or grease spills. What can I do in the bathroom? Use night lights. Install grab bars by the toilet and in the tub and shower. Do not use towel bars as grab bars. Use non-skid mats or decals in the tub or shower. If you need to sit down in the shower, use a plastic, non-slip stool. Keep the floor dry. Clean up any  water that spills on the floor as soon as it happens. Remove soap buildup in the tub or shower regularly. Attach bath mats securely with double-sided non-slip rug tape. Do not have throw rugs and other things on the floor that can make you trip. What can I do in the bedroom? Use night lights. Make sure that you have a light by your bed that is easy to reach. Do not use any sheets or blankets that are too big for your bed. They should not hang down onto the floor. Have a firm chair that has side arms. You can use this for support while you get dressed. Do not have throw rugs and other things on the floor that can make you trip. What can I do in the kitchen? Clean up any spills right away. Avoid walking on wet floors. Keep items that you use a lot in easy-to-reach places. If you need to reach something above you, use a strong step stool that has a grab bar. Keep electrical cords out of the way. Do not use floor polish or wax that makes floors slippery. If you must use wax, use non-skid floor wax. Do not have throw rugs and other things on the floor that can make you trip. What can I do with my stairs? Do not leave any items on the stairs. Make sure that there are  handrails on both sides of the stairs and use them. Fix handrails that are broken or loose. Make sure that handrails are as long as the stairways. Check any carpeting to make sure that it is firmly attached to the stairs. Fix any carpet that is loose or worn. Avoid having throw rugs at the top or bottom of the stairs. If you do have throw rugs, attach them to the floor with carpet tape. Make sure that you have a light switch at the top of the stairs and the bottom of the stairs. If you do not have them, ask someone to add them for you. What else can I do to help prevent falls? Wear shoes that: Do not have high heels. Have rubber bottoms. Are comfortable and fit you well. Are closed at the toe. Do not wear sandals. If you use a  stepladder: Make sure that it is fully opened. Do not climb a closed stepladder. Make sure that both sides of the stepladder are locked into place. Ask someone to hold it for you, if possible. Clearly mark and make sure that you can see: Any grab bars or handrails. First and last steps. Where the edge of each step is. Use tools that help you move around (mobility aids) if they are needed. These include: Canes. Walkers. Scooters. Crutches. Turn on the lights when you go into a dark area. Replace any light bulbs as soon as they burn out. Set up your furniture so you have a clear path. Avoid moving your furniture around. If any of your floors are uneven, fix them. If there are any pets around you, be aware of where they are. Review your medicines with your doctor. Some medicines can make you feel dizzy. This can increase your chance of falling. Ask your doctor what other things that you can do to help prevent falls. This information is not intended to replace advice given to you by your health care provider. Make sure you discuss any questions you have with your health care provider. Document Released: 02/01/2009 Document Revised: 09/13/2015 Document Reviewed: 05/12/2014 Elsevier Interactive Patient Education  2017 [employer].

## 2022-03-28 NOTE — Telephone Encounter (Signed)
Patient seen today for AWV and requested a refill on Adderall '10mg'$  to be sent to [facility]. Message sent to PCP team care.

## 2022-03-28 NOTE — Progress Notes (Signed)
Subjective:   Jose Evans is a 76 y.o. male who presents for Medicare Annual/Subsequent preventive examination.  Review of Systems    Virtual Visit via Telephone Note  I connected with  Jose Evans on 03/28/22 at 11:30 AM EST by telephone and verified that I am speaking with the correct person using two identifiers.  Location: Patient: Home Provider: Office Persons participating in the virtual visit: patient/Nurse Health Advisor   I discussed the limitations, risks, security and privacy concerns of performing an evaluation and management service by telephone and the availability of in person appointments. The patient expressed understanding and agreed to proceed.  Interactive audio and video telecommunications were attempted between this nurse and patient, however failed, due to patient having technical difficulties OR patient did not have access to video capability.  We continued and completed visit with audio only.  Some vital signs may be absent or patient reported.   Criselda Peaches, LPN  Cardiac Risk Factors include: advanced age (>78mn, >>65women);hypertension;male gender     Objective:    Today's Vitals   03/28/22 1125  Weight: 175 lb (79.4 kg)  Height: '5\' 9"'$  (1.753 m)   Body mass index is 25.84 kg/m.     03/28/2022   11:34 AM 10/15/2021   10:46 AM 10/15/2021    9:43 AM 05/27/2021   12:17 PM 01/17/2021    1:20 PM 02/09/2020    9:15 AM 09/08/2017   11:56 AM  Advanced Directives  Does Patient Have a Medical Advance Directive? Yes No No No No No   Type of AParamedicof ANomaLiving will        Copy of [place] Chart? No - copy requested        Would patient like information on creating a medical advance directive?  No - Patient declined  No - Patient declined  No - Patient declined No - Patient declined    Current Medications (verified) Outpatient Encounter Medications as of 03/28/2022  Medication Sig    acetaminophen (TYLENOL) 500 MG tablet Take 1,000 mg by mouth every 6 (six) hours as needed for mild pain.   allopurinol (ZYLOPRIM) 300 MG tablet TAKE 1 TABLET BY MOUTH EVERY DAY (Patient taking differently: Take 300 mg by mouth daily.)   amphetamine-dextroamphetamine (ADDERALL) 10 MG tablet Take 1 tablet (10 mg total) by mouth daily with breakfast.   amphetamine-dextroamphetamine (ADDERALL) 10 MG tablet Take 1 tablet (10 mg total) by mouth daily with breakfast.   amphetamine-dextroamphetamine (ADDERALL) 10 MG tablet Take 1 tablet (10 mg total) by mouth daily with breakfast.   citalopram (CELEXA) 10 MG tablet Take 1 tablet (10 mg total) by mouth daily.   diltiazem (CARDIZEM SR) 120 MG 12 hr capsule TAKE 1 CAPSULE BY MOUTH EVERY DAY   doxycycline (VIBRAMYCIN) 100 MG capsule TAKE 1 CAPSULE BY MOUTH TWICE A DAY   finasteride (PROSCAR) 5 MG tablet TAKE 1 TABLET (5 MG TOTAL) BY MOUTH DAILY.   levothyroxine (SYNTHROID) 175 MCG tablet TAKE 1 TABLET BY MOUTH DAILY BEFORE BREAKFAST.   lisinopril (ZESTRIL) 5 MG tablet TAKE 1 TABLET (5 MG TOTAL) BY MOUTH DAILY.   metFORMIN (GLUCOPHAGE) 500 MG tablet TAKE 1 TABLET BY MOUTH TWICE A DAY WITH FOOD (Patient not taking: Reported on 03/28/2022)   metoprolol succinate (TOPROL-XL) 50 MG 24 hr tablet TAKE 1 TABLET BY MOUTH EVERY DAY   traMADol (ULTRAM) 50 MG tablet Take 1 tablet (50 mg total) by mouth every 6 (six)  hours as needed (pain).   traZODone (DESYREL) 50 MG tablet TAKE 1 TABLET BY MOUTH AT BEDTIME AS NEEDED FOR SLEEP   triamterene-hydrochlorothiazide (MAXZIDE) 75-50 MG tablet TAKE 1 TABLET BY MOUTH EVERY DAY   XARELTO 20 MG TABS tablet TAKE 1 TABLET BY MOUTH DAILY WITH SUPPER. (Patient taking differently: Take 20 mg by mouth daily with supper.)   No facility-administered encounter medications on file as of 03/28/2022.    Allergies (verified) Glucophage [metformin]   History: Past Medical History:  Diagnosis Date   Adult acne    Cancer ([place]) 07/2017    skin cancer   Diabetes mellitus without complication ([place])    Dyspnea    Gout    Hypertension    Hypothyroidism    Thyroid disease    Past Surgical History:  Procedure Laterality Date   HERNIA REPAIR  2007   x2   SHOULDER SURGERY Right 2018   TONSILLECTOMY     age 4   Family History  Problem Relation Age of Onset   Hypertension Other    Colon cancer Neg Hx    Rectal cancer Neg Hx    Stomach cancer Neg Hx    Esophageal cancer Neg Hx    Social History   Socioeconomic History   Marital status: Married    Spouse name: Not on file   Number of children: Not on file   Years of education: Not on file   Highest education level: Not on file  Occupational History   Not on file  Tobacco Use   Smoking status: Former    Packs/day: 0.50    Years: 30.00    Total pack years: 15.00    Types: Cigarettes    Quit date: 09/07/2005    Years since quitting: 16.5   Smokeless tobacco: Never  Vaping Use   Vaping Use: Never used  Substance and Sexual Activity   Alcohol use: Not Currently   Drug use: No   Sexual activity: Not on file  Other Topics Concern   Not on file  Social History Narrative   Works for [employer]    Married   m   Social Determinants of Health   Financial Resource Strain: Low Risk  (03/28/2022)   Overall Financial Resource Strain (CARDIA)    Difficulty of Paying Living Expenses: Not hard at all  Food Insecurity: No Food Insecurity (03/28/2022)   Hunger Vital Sign    Worried About Running Out of Food in the Last Year: Never true    Ran Out of Food in the Last Year: Never true  Transportation Needs: No Transportation Needs (03/28/2022)   PRAPARE - Hydrologist (Medical): No    Lack of Transportation (Non-Medical): No  Physical Activity: Inactive (03/28/2022)   Exercise Vital Sign    Days of Exercise per Week: 0 days    Minutes of Exercise per Session: 0 min  Stress: No Stress Concern Present (03/28/2022)   [place]    Feeling of Stress : Not at all  Social Connections: Moderately Isolated (03/28/2022)   Social Connection and Isolation Panel [NHANES]    Frequency of Communication with Friends and Family: More than three times a week    Frequency of Social Gatherings with Friends and Family: More than three times a week    Attends Religious Services: Never    Marine scientist or Organizations: No    Attends Club or  Organization Meetings: Never    Marital Status: Married    Tobacco Counseling Counseling given: Not Answered   Clinical Intake:  Pre-visit preparation completed: Yes  Pain : No/denies pain     BMI - recorded: 25.84 Nutritional Status: BMI 25 -29 Overweight Nutritional Risks: None Diabetes: No  How often do you need to have someone help you when you read instructions, pamphlets, or other written materials from your doctor or pharmacy?: 1 - Never  Diabetic?  No  Interpreter Needed?: No  Information entered by :: Rolene Arbour LPN   Activities of Daily Living    03/28/2022   11:31 AM 03/28/2022    8:54 AM  In your present state of health, do you have any difficulty performing the following activities:  Hearing? 0 0  Vision? 0 0  Difficulty concentrating or making decisions? 0 0  Walking or climbing stairs? 0 0  Dressing or bathing? 0 0  Doing errands, shopping? 0 0  Preparing Food and eating ? N N  Using the Toilet? N N  In the past six months, have you accidently leaked urine? N N  Do you have problems with loss of bowel control? N N  Managing your Medications? N N  Managing your Finances? N N  Housekeeping or managing your Housekeeping? N N    Patient Care Team: [name], NP as PCP - General (Family Medicine) Constance Haw, MD as PCP - Electrophysiology (Cardiology) Viona Gilmore, [facility] as Pharmacist (Pharmacist)  Indicate any recent Medical Services you may have  received from other than [facility] providers in the past year (date may be approximate).     Assessment:   This is a routine wellness examination for Jose Evans.  Hearing/Vision screen Hearing Screening - Comments:: Denies hearing difficulties   Vision Screening - Comments:: Wears rx glasses - up to date with routine eye exams with  [facility]  Dietary issues and exercise activities discussed: Current Exercise Habits: The patient does not participate in regular exercise at present, Exercise limited by: None identified   Goals Addressed               This Visit's Progress     Stay Healthy (pt-stated)        Finish projects around the house       Depression Screen    03/28/2022   11:31 AM 10/03/2021    1:33 PM 01/17/2021    1:23 PM 01/17/2021    1:19 PM 01/26/2020    1:00 PM 12/30/2017   11:18 AM 05/28/2015    3:01 PM  PHQ 2/9 Scores  PHQ - 2 Score 0 4 0 0 0 0 0  PHQ- 9 Score  14         Fall Risk    03/28/2022   11:32 AM 03/28/2022    8:54 AM 10/03/2021    1:34 PM 01/17/2021    1:22 PM 01/26/2020    1:00 PM  Fall Risk   Falls in the past year? 0 0 1 0 0  Number falls in past yr: 0 0 0 0 0  Injury with Fall? 0 0 1 0   Risk for fall due to : No Fall Risks  History of fall(s)    Follow up Falls prevention discussed  Falls evaluation completed Falls evaluation completed     [place]:  Any stairs in or around the home? Yes  If so, are there any without handrails? No  Home free of loose throw rugs in walkways, pet beds, electrical cords, etc? Yes  Adequate lighting in your home to reduce risk of falls? Yes   ASSISTIVE DEVICES UTILIZED TO PREVENT FALLS:  Life alert? No  Use of a cane, walker or w/c? No  Grab bars in the bathroom? Yes Shower chair or bench in shower? Yes  Elevated toilet seat or a handicapped toilet? No   TIMED UP AND GO:  Was the test performed? No . Audio Visit   Cognitive Function:        03/28/2022   11:34  AM  6CIT Screen  What Year? 0 points  What month? 0 points  What time? 0 points  Count back from 20 0 points  Months in reverse 0 points  Repeat phrase 0 points  Total Score 0 points    Immunizations Immunization History  Administered Date(s) Administered   Fluad Quad(high Dose 65+) 12/09/2018, 01/26/2020   Influenza Whole 03/05/2010   Influenza, High Dose Seasonal PF 03/13/2014, 05/28/2015, 12/30/2017, 02/22/2021   Influenza,inj,Quad PF,6+ Mos 02/09/2013   Influenza-Unspecified 02/22/2021   PFIZER Comirnaty(Gray Top)Covid-19 Tri-Sucrose Vaccine 09/06/2020   PFIZER(Purple Top)SARS-COV-2 Vaccination 06/17/2019, 07/12/2019, 01/20/2020   Pneumococcal Conjugate-13 05/28/2015   Pneumococcal Polysaccharide-23 06/25/2016   Tdap 06/16/2011    TDAP status: Due, Education has been provided regarding the importance of this vaccine. Advised may receive this vaccine at local pharmacy or Health Dept. Aware to provide a copy of the vaccination record if obtained from local pharmacy or Health Dept. Verbalized acceptance and understanding.  Flu Vaccine status: Up to date  Pneumococcal vaccine status: Up to date  Covid-19 vaccine status: Completed vaccines  Qualifies for Shingles Vaccine? Yes   Zostavax completed No   Shingrix Completed?: No.    Education has been provided regarding the importance of this vaccine. Patient has been advised to call insurance company to determine out of pocket expense if they have not yet received this vaccine. Advised may also receive vaccine at local pharmacy or Health Dept. Verbalized acceptance and understanding.  Screening Tests Health Maintenance  Topic Date Due   Diabetic kidney evaluation - Urine ACR  Never done   DTaP/Tdap/Td (2 - Td or Tdap) 06/15/2021   COVID-19 Vaccine (5 - 2023-24 season) 04/13/2022 (Originally 12/20/2021)   Zoster Vaccines- Shingrix (1 of 2) 06/27/2022 (Originally [birth year]14/1966)   INFLUENZA VACCINE  07/20/2022 (Originally 11/19/2021)    COLONOSCOPY (Pts 45-45yr Insurance coverage will need to be confirmed)  03/29/2023 (Originally 10/13/1992)   Diabetic kidney evaluation - GFR measurement  10/16/2022   Medicare Annual Wellness (AWV)  03/29/2023   Pneumonia Vaccine 76 Years old  Completed   Hepatitis C Screening  Completed   HPV VACCINES  Aged Out    Health Maintenance  Health Maintenance Due  Topic Date Due   Diabetic kidney evaluation - Urine ACR  Never done   DTaP/Tdap/Td (2 - Td or Tdap) 06/15/2021    Colorectal cancer screening: Referral to GI placed Patient deferred. Pt aware the office will call re: appt.  Lung Cancer Screening: (Low Dose CT Chest recommended if Age 348-80years, 30 pack-year currently smoking OR have quit w/in 15years.) does not qualify.     Additional Screening:  Hepatitis C Screening: does qualify; Completed 02/28/21  Vision Screening: Recommended annual ophthalmology exams for early detection of glaucoma and other disorders of the eye. Is the patient up to date with their annual eye exam?  Yes  Who is the provider or what is the  name of the office in which the patient attends annual eye exams? [facility] If pt is not established with a provider, would they like to be referred to a provider to establish care? No .   Dental Screening: Recommended annual dental exams for proper oral hygiene  Community Resource Referral / Chronic Care Management:  CRR required this visit?  No   CCM required this visit?  No      Plan:     I have personally reviewed and noted the following in the patient's chart:   Medical and social history Use of alcohol, tobacco or illicit drugs  Current medications and supplements including opioid prescriptions. Patient is currently taking opioid prescriptions. Information provided to patient regarding non-opioid alternatives. Patient advised to discuss non-opioid treatment plan with their provider. Functional ability and status Nutritional  status Physical activity Advanced directives List of other physicians Hospitalizations, surgeries, and ER visits in previous 12 months Vitals Screenings to include cognitive, depression, and falls Referrals and appointments  In addition, I have reviewed and discussed with patient certain preventive protocols, quality metrics, and best practice recommendations. A written personalized care plan for preventive services as well as general preventive health recommendations were provided to patient.     Criselda Peaches, LPN   30/0/9233  Nurse Notes: Patient due Dtap/Tdap/Td(2-Td or Tdap) and Diabetic Kidney Evaluation Inocente Salles

## 2022-04-02 ENCOUNTER — Other Ambulatory Visit: Payer: Self-pay | Admitting: Adult Health

## 2022-04-02 DIAGNOSIS — F325 Major depressive disorder, single episode, in full remission: Secondary | ICD-10-CM

## 2022-04-02 NOTE — Telephone Encounter (Signed)
Opened in error

## 2022-04-17 ENCOUNTER — Telehealth: Payer: Self-pay

## 2022-04-17 NOTE — Telephone Encounter (Signed)
Patient notified of update  and verbalized understanding. 

## 2022-04-17 NOTE — Telephone Encounter (Signed)
-  Caller stated that he has a bruise that encircles his entire rt arm. He doesn't remember any injuries. He is on a blood thinner. His arm is swollen in the elbow and RFA. He noted the bruise that has been getting bigger over the past 4 days.  04/17/2022 11:39:39 AM See HCP within 4 Hours (or PCP triage) [name], RN, Levada Dy  Comments User: Raford Pitcher, RN Date/Time Eilene Ghazi Time): 04/17/2022 11:44:12 AM Caller isn't sure which [facility] he will go to. No app't available at the office. Referrals  REFERRED TO PCP OFFICE Warm transfer to backline  Pt has appt with PCP on 04/23/22

## 2022-04-18 ENCOUNTER — Encounter (HOSPITAL_COMMUNITY): Payer: Self-pay

## 2022-04-18 ENCOUNTER — Ambulatory Visit (HOSPITAL_COMMUNITY)
Admission: RE | Admit: 2022-04-18 | Discharge: 2022-04-18 | Disposition: A | Discharge status: Home / Self Care | Payer: Medicare HMO | Source: Ambulatory Visit | Attending: Otolaryngology | Admitting: Otolaryngology

## 2022-04-18 DIAGNOSIS — K118 Other diseases of salivary glands: Secondary | ICD-10-CM

## 2022-04-20 ENCOUNTER — Other Ambulatory Visit: Payer: Self-pay | Admitting: Adult Health

## 2022-04-20 DIAGNOSIS — I10 Essential (primary) hypertension: Secondary | ICD-10-CM

## 2022-04-22 ENCOUNTER — Other Ambulatory Visit: Payer: Medicare HMO

## 2022-04-23 ENCOUNTER — Ambulatory Visit (INDEPENDENT_AMBULATORY_CARE_PROVIDER_SITE_OTHER): Payer: Medicare HMO | Admitting: Adult Health

## 2022-04-23 ENCOUNTER — Encounter: Payer: Self-pay | Admitting: Adult Health

## 2022-04-23 ENCOUNTER — Other Ambulatory Visit: Payer: Self-pay | Admitting: Adult Health

## 2022-04-23 VITALS — BP 110/80 | HR 72 | Temp 97.9°F | Ht 69.0 in | Wt 181.0 lb

## 2022-04-23 DIAGNOSIS — T148XXA Other injury of unspecified body region, initial encounter: Secondary | ICD-10-CM

## 2022-04-23 DIAGNOSIS — R7309 Other abnormal glucose: Secondary | ICD-10-CM | POA: Diagnosis not present

## 2022-04-23 DIAGNOSIS — M7021 Olecranon bursitis, right elbow: Secondary | ICD-10-CM | POA: Diagnosis not present

## 2022-04-23 DIAGNOSIS — E039 Hypothyroidism, unspecified: Secondary | ICD-10-CM

## 2022-04-23 LAB — HEMOGLOBIN A1C: Hgb A1c MFr Bld: 6.6 % — ABNORMAL HIGH (ref 4.6–6.5)

## 2022-04-23 LAB — TSH: TSH: 0.08 u[IU]/mL — ABNORMAL LOW (ref 0.35–5.50)

## 2022-04-23 NOTE — Progress Notes (Addendum)
Subjective:    Patient ID: Jose Evans, male    DOB: 10-07-1945, 77 y.o.   MRN: [id number]  HPI  77 year old male who comes to the office today for multiple issues.  First issue is that of a bruise on his right forearm and upper arm, most recent bruising happened roughly 11 days ago.  He denies any trauma or aggravating injury.  Reports that the bruise is healing and is no "yellow".  At about the same time he noticed swelling around his elbow.  Per patient reports this happens roughly every 2 months.  Additionally, he reports that he quit taking metformin due to GI issues.  He quit this about a month and a half ago and did not inform us.  He is on metformin due to glucose intolerance.  Additionally, he would like to recheck his TSH.  He is currently on Synthroid 175 mcg daily but his last TSH was abnormal  Lab Results  Component Value Date   TSH 0.098 (L) 10/15/2021   Finally, he has a history of ADHD which is well-managed with 10 mg daily.  He does not need any refills right now.  Denies side effects such as tachycardia, insomnia, or anorexia  Review of Systems See HPI   Past Medical History:  Diagnosis Date   Adult acne    Cancer ([place]) 07/2017   skin cancer   Diabetes mellitus without complication ([place])    Dyspnea    Gout    Hypertension    Hypothyroidism    Thyroid disease     Social History   Socioeconomic History   Marital status: Married    Spouse name: Not on file   Number of children: Not on file   Years of education: Not on file   Highest education level: Associate degree: academic program  Occupational History   Not on file  Tobacco Use   Smoking status: Former    Packs/day: 0.50    Years: 30.00    Total pack years: 15.00    Types: Cigarettes    Quit date: 09/07/2005    Years since quitting: 16.6   Smokeless tobacco: Never  Vaping Use   Vaping Use: Never used  Substance and Sexual Activity   Alcohol use: Not Currently   Drug use: No   Sexual  activity: Not on file  Other Topics Concern   Not on file  Social History Narrative   Works for [employer]    Married   m   Social Determinants of Health   Financial Resource Strain: [place]  (04/22/2022)   Overall Financial Resource Strain (CARDIA)    Difficulty of Paying Living Expenses: Not very hard  Food Insecurity: No Food Insecurity (04/22/2022)   Hunger Vital Sign    Worried About Running Out of Food in the Last Year: Never true    [employer] in the Last Year: Never true  Transportation Needs: No Transportation Needs (04/22/2022)   PRAPARE - Hydrologist (Medical): No    Lack of Transportation (Non-Medical): No  Physical Activity: Unknown (04/22/2022)   Exercise Vital Sign    Days of Exercise per Week: 4 days    Minutes of Exercise per Session: Patient refused  Recent Concern: Physical Activity - Inactive (03/28/2022)   Exercise Vital Sign    Days of Exercise per Week: 0 days    Minutes of Exercise per Session: 0 min  Stress: No Stress Concern Present (04/22/2022)  [employer] - Occupational Stress Questionnaire    Feeling of Stress : Not at all  Social Connections: Moderately Isolated (04/22/2022)   Social Connection and Isolation Panel [NHANES]    Frequency of Communication with Friends and Family: More than three times a week    Frequency of Social Gatherings with Friends and Family: Once a week    Attends Religious Services: Never    Marine scientist or Organizations: No    Attends Archivist Meetings: Never    Marital Status: Married  Human resources officer Violence: Not At Risk (03/28/2022)   Humiliation, Afraid, Rape, and Kick questionnaire    Fear of Current or Ex-Partner: No    Emotionally Abused: No    Physically Abused: No    Sexually Abused: No    Past Surgical History:  Procedure Laterality Date   HERNIA REPAIR  2007   x2   SHOULDER SURGERY Right 2018   TONSILLECTOMY      age 56    Family History  Problem Relation Age of Onset   Hypertension Other    Colon cancer Neg Hx    Rectal cancer Neg Hx    Stomach cancer Neg Hx    Esophageal cancer Neg Hx     Allergies  Allergen Reactions   Glucophage [Metformin] Other (See Comments)    Severe fatigue    Current Outpatient Medications on File Prior to Visit  Medication Sig Dispense Refill   acetaminophen (TYLENOL) 500 MG tablet Take 1,000 mg by mouth every 6 (six) hours as needed for mild pain.     allopurinol (ZYLOPRIM) 300 MG tablet TAKE 1 TABLET BY MOUTH EVERY DAY (Patient taking differently: Take 300 mg by mouth daily.) 90 tablet 3   amphetamine-dextroamphetamine (ADDERALL) 10 MG tablet Take 1 tablet (10 mg total) by mouth daily with breakfast. 30 tablet 0   amphetamine-dextroamphetamine (ADDERALL) 10 MG tablet Take 1 tablet (10 mg total) by mouth daily with breakfast. 30 tablet 0   amphetamine-dextroamphetamine (ADDERALL) 10 MG tablet Take 1 tablet (10 mg total) by mouth daily with breakfast. 30 tablet 0   citalopram (CELEXA) 10 MG tablet TAKE 1 TABLET BY MOUTH EVERY DAY 90 tablet 1   diltiazem (CARDIZEM SR) 120 MG 12 hr capsule TAKE 1 CAPSULE BY MOUTH EVERY DAY 90 capsule 2   doxycycline (VIBRAMYCIN) 100 MG capsule TAKE 1 CAPSULE BY MOUTH TWICE A DAY 60 capsule 3   finasteride (PROSCAR) 5 MG tablet TAKE 1 TABLET (5 MG TOTAL) BY MOUTH DAILY. 90 tablet 3   levothyroxine (SYNTHROID) 175 MCG tablet TAKE 1 TABLET BY MOUTH DAILY BEFORE BREAKFAST. 90 tablet 0   lisinopril (ZESTRIL) 5 MG tablet TAKE 1 TABLET (5 MG TOTAL) BY MOUTH DAILY. 90 tablet 0   metFORMIN (GLUCOPHAGE) 500 MG tablet TAKE 1 TABLET BY MOUTH TWICE A DAY WITH FOOD 180 tablet 0   metoprolol succinate (TOPROL-XL) 50 MG 24 hr tablet TAKE 1 TABLET BY MOUTH EVERY DAY 90 tablet 1   traMADol (ULTRAM) 50 MG tablet Take 1 tablet (50 mg total) by mouth every 6 (six) hours as needed (pain). 30 tablet 1   traZODone (DESYREL) 50 MG tablet TAKE 1 TABLET BY  MOUTH AT BEDTIME AS NEEDED FOR SLEEP 90 tablet 1   triamterene-hydrochlorothiazide (MAXZIDE) 75-50 MG tablet TAKE 1 TABLET BY MOUTH EVERY DAY 90 tablet 1   XARELTO 20 MG TABS tablet TAKE 1 TABLET BY MOUTH DAILY WITH SUPPER. (Patient taking differently: Take 20 mg  by mouth daily with supper.) 30 tablet 11   No current facility-administered medications on file prior to visit.    BP 110/80   Pulse 72   Temp 97.9 F (36.6 C) (Oral)   Ht '5\' 9"'$  (1.753 m)   Wt 181 lb (82.1 kg)   SpO2 97%   BMI 26.73 kg/m       Objective:   Physical Exam Vitals and nursing note reviewed.  Constitutional:      Appearance: Normal appearance.  Cardiovascular:     Rate and Rhythm: Normal rate and regular rhythm.     Pulses: Normal pulses.     Heart sounds: Normal heart sounds.  Pulmonary:     Effort: Pulmonary effort is normal.     Breath sounds: Normal breath sounds.  Musculoskeletal:        General: Normal range of motion.     Right elbow: Deformity present. Normal range of motion. No tenderness.     Comments: + olecranon bursitis noted   Skin:    General: Skin is warm and dry.     Findings: Bruising (yellow discoloration noted to right forarm and upper arm) present.  Neurological:     General: No focal deficit present.     Mental Status: He is alert and oriented to person, place, and time.  Psychiatric:        Mood and Affect: Mood normal.        Behavior: Behavior normal.        Thought Content: Thought content normal.        Judgment: Judgment normal.       Assessment & Plan:  1. Elevated glucose level - Consider adding ER metformin  - Hemoglobin A1c; Future - Hemoglobin A1c  2. Hypothyroidism, unspecified type  - TSH; Future - TSH  3. Bruising - likely from trauma caused from olecranon bursa and being on blood thinners   4. Olecranon bursitis of right elbow -Verbal consent obtained.  Using alcohol and chlorhexidine right sided olecranon and was cleansed in sterile fashion.   Using cold spray for topical anesthesia an injection was made using 22-gauge needle and 3 mL of bloody fluid was removed.  Elbow was wrapped in Ace bandage.  Advised to follow-up instructions  [name], NP

## 2022-04-24 ENCOUNTER — Other Ambulatory Visit: Payer: Self-pay

## 2022-04-24 MED ORDER — GLIPIZIDE ER 2.5 MG PO TB24
2.5000 mg | ORAL_TABLET | Freq: Every day | ORAL | 0 refills | Status: DC
Start: 2022-04-24 — End: 2022-05-23

## 2022-04-27 ENCOUNTER — Other Ambulatory Visit: Payer: Self-pay | Admitting: Adult Health

## 2022-04-27 DIAGNOSIS — E039 Hypothyroidism, unspecified: Secondary | ICD-10-CM

## 2022-05-06 ENCOUNTER — Other Ambulatory Visit: Payer: Self-pay | Admitting: Adult Health

## 2022-05-06 DIAGNOSIS — I10 Essential (primary) hypertension: Secondary | ICD-10-CM

## 2022-05-08 NOTE — Addendum Note (Signed)
Addended by: Apolinar Junes on: 05/08/2022 07:56 AM   Modules accepted: Level of Service

## 2022-05-19 ENCOUNTER — Ambulatory Visit (HOSPITAL_COMMUNITY)
Admission: RE | Admit: 2022-05-19 | Discharge: 2022-05-19 | Disposition: A | Discharge status: Home / Self Care | Payer: Medicare HMO | Source: Ambulatory Visit | Attending: Otolaryngology | Admitting: Otolaryngology

## 2022-05-19 ENCOUNTER — Encounter (HOSPITAL_COMMUNITY): Payer: Self-pay | Admitting: Radiology

## 2022-05-19 DIAGNOSIS — K118 Other diseases of salivary glands: Secondary | ICD-10-CM | POA: Diagnosis not present

## 2022-05-19 MED ORDER — IOHEXOL 300 MG/ML  SOLN
75.0000 mL | Freq: Once | INTRAMUSCULAR | Status: AC | PRN
  Administered 2022-05-19: 75 mL via INTRAVENOUS

## 2022-05-20 LAB — POCT I-STAT CREATININE: Creatinine, Ser: 1.2 mg/dL (ref 0.61–1.24)

## 2022-05-23 ENCOUNTER — Other Ambulatory Visit: Payer: Self-pay | Admitting: Adult Health

## 2022-05-27 DIAGNOSIS — K118 Other diseases of salivary glands: Secondary | ICD-10-CM | POA: Diagnosis not present

## 2022-05-27 DIAGNOSIS — Z7901 Long term (current) use of anticoagulants: Secondary | ICD-10-CM | POA: Diagnosis not present

## 2022-05-29 ENCOUNTER — Encounter (HOSPITAL_COMMUNITY): Payer: Self-pay | Admitting: *Deleted

## 2022-05-30 ENCOUNTER — Encounter: Payer: Self-pay | Admitting: Adult Health

## 2022-05-30 ENCOUNTER — Other Ambulatory Visit: Payer: Self-pay | Admitting: Adult Health

## 2022-05-30 ENCOUNTER — Telehealth: Payer: Self-pay

## 2022-05-30 ENCOUNTER — Other Ambulatory Visit (INDEPENDENT_AMBULATORY_CARE_PROVIDER_SITE_OTHER): Payer: Medicare HMO

## 2022-05-30 DIAGNOSIS — E039 Hypothyroidism, unspecified: Secondary | ICD-10-CM | POA: Diagnosis not present

## 2022-05-30 LAB — TSH: TSH: 1.09 u[IU]/mL (ref 0.35–5.50)

## 2022-05-30 MED ORDER — TRAMADOL HCL 50 MG PO TABS: 50.0000 mg | ORAL_TABLET | Freq: Three times a day (TID) | ORAL | 2 refills | Status: DC | PRN

## 2022-05-30 MED ORDER — LEVOTHYROXINE SODIUM 150 MCG PO TABS: 150.0000 ug | ORAL_TABLET | Freq: Every day | ORAL | 3 refills | Status: DC

## 2022-05-30 NOTE — Telephone Encounter (Signed)
Please advise 

## 2022-05-30 NOTE — Progress Notes (Unsigned)
Care Management & Coordination Services Pharmacy Team  Reason for Encounter: Hypertension  Contacted patient to discuss hypertension disease state. {US HC Outreach:28874}  SCHEDULE FOLLOW UP Current antihypertensive regimen:  Diltiazem 120 mg daily Lisinopril 5 mg daily Metoprolol 50 mg daily Maxide 75/50 mg daily Patient verbally confirms he is taking the above medications as directed. {yes/no:20286}  How often are you checking your Blood Pressure? {CHL HP BP Monitoring [phone]}  he checks his blood pressure {timing:25218} {before/after:25217} taking his medication.  Current home BP readings: ***  DATE:             BP               PULSE   Wrist or arm cuff:  OTC medications including pseudoephedrine or NSAIDs?  Any readings above 180/100? {yes/no:20286} If yes any symptoms of hypertensive emergency? {hypertensive emergency symptoms:25354}  What recent interventions/DTPs have been made by any provider to improve Blood Pressure control since last CPP Visit: No interventions noted  Any recent hospitalizations or ED visits since last visit with CPP? No recent hospital visits.   What diet changes have been made to improve Blood Pressure Control?  Diet followed: Breakfast - patient  Lunch - patient  Dinner - patient  Caffeine intake: Salt intake:  What exercise is being done to improve your Blood Pressure Control?  ***  Adherence Review: Is the patient currently on ACE/ARB medication? Yes Does the patient have >5 day gap between last estimated fill dates? No  Star Rating Drugs:  Lisinopril 5 mg - last [facility]22/2022 [facility] Glipzide ER 2.5 mg - last filled 05/23/2022 [facility]  Chart Updates: Recent office visits:  04/23/2022 [name] NP - Patient was seen for elevated glucose level and additional concerns. Discontinued Celexa, Doxycycline, Metformin and Tramadol.   03/28/2022 Rolene Arbour LPN - Encounter for Medicare annual  wellness exam   Recent consult visits:  05/27/2022 Melida Quitter MD (ENT) - Patient was seen for mass of left partoid gland and an additional concern. No medication changes.  03/11/2022 Jerrell Belfast ([facility]) - patient was seen for mass of left partoid gland. No additional chart notes.   01/06/2022 Tania Ade (ortho) - Patient was seen for Complete rotator cuff tear or rupture of left shoulder, not specified as traumatic and an additional concern. No additional chart notes.   Hospital visits:  None  Medications: Outpatient Encounter Medications as of 05/30/2022  Medication Sig   acetaminophen (TYLENOL) 500 MG tablet Take 1,000 mg by mouth every 6 (six) hours as needed for mild pain.   allopurinol (ZYLOPRIM) 300 MG tablet TAKE 1 TABLET BY MOUTH EVERY DAY (Patient taking differently: Take 300 mg by mouth daily.)   amphetamine-dextroamphetamine (ADDERALL) 10 MG tablet Take 1 tablet (10 mg total) by mouth daily with breakfast.   amphetamine-dextroamphetamine (ADDERALL) 10 MG tablet Take 1 tablet (10 mg total) by mouth daily with breakfast.   amphetamine-dextroamphetamine (ADDERALL) 10 MG tablet Take 1 tablet (10 mg total) by mouth daily with breakfast.   diltiazem (CARDIZEM SR) 120 MG 12 hr capsule TAKE 1 CAPSULE BY MOUTH EVERY DAY   finasteride (PROSCAR) 5 MG tablet TAKE 1 TABLET (5 MG TOTAL) BY MOUTH DAILY.   glipiZIDE (GLUCOTROL XL) 2.5 MG 24 hr tablet TAKE 1 TABLET BY MOUTH DAILY WITH BREAKFAST.   lisinopril (ZESTRIL) 5 MG tablet TAKE 1 TABLET (5 MG TOTAL) BY MOUTH DAILY.   metoprolol succinate (TOPROL-XL) 50 MG 24 hr tablet TAKE  1 TABLET BY MOUTH EVERY DAY   traZODone (DESYREL) 50 MG tablet TAKE 1 TABLET BY MOUTH AT BEDTIME AS NEEDED FOR SLEEP   triamterene-hydrochlorothiazide (MAXZIDE) 75-50 MG tablet TAKE 1 TABLET BY MOUTH EVERY DAY   XARELTO 20 MG TABS tablet TAKE 1 TABLET BY MOUTH DAILY WITH SUPPER. (Patient taking differently: Take 20 mg by mouth daily with supper.)   No  facility-administered encounter medications on file as of 05/30/2022.  Fill History:   Dispensed Days Supply Quantity Provider Pharmacy  ALLOPURINOL 300 MG TABLET 04/27/2022 90 90 each      Dispensed Days Supply Quantity Provider Pharmacy  DEXTROAMP-AMPHETAMIN 10 MG TAB 04/24/2022 30 30 each      Dispensed Days Supply Quantity Provider Pharmacy  DILTIAZEM 12HR ER 120 MG CAP 05/08/2022 90 90 each      Dispensed Days Supply Quantity Provider Pharmacy  FINASTERIDE 5 MG TABLET 05/03/2022 90 90 each      Dispensed Days Supply Quantity Provider Pharmacy  GLIPIZIDE ER 2.5 MG TABLET 05/23/2022 90 90 each      Dispensed Days Supply Quantity Provider Pharmacy  LISINOPRIL 5 MG TABLET 04/22/2022 90 90 each      Dispensed Days Supply Quantity Provider Pharmacy  METOPROLOL SUCC ER 50 MG TAB 05/23/2022 90 90 each      Dispensed Days Supply Quantity Provider Pharmacy  XARELTO 20MG TAB 05/27/2022 30 30 tablet     TRAZODONE 50 MG TABLET 05/03/2022 90 90 each    Dispensed Days Supply Quantity Provider Pharmacy  TRIAMTERENE-HCTZ 75-50 MG TAB 03/24/2022 90 90 each      Recent Office Vitals: BP Readings from Last 3 Encounters:  04/23/22 110/80  12/02/21 118/70  10/16/21 110/76   Pulse Readings from Last 3 Encounters:  04/23/22 72  12/02/21 (!) 54  10/16/21 (!) 121    Wt Readings from Last 3 Encounters:  04/23/22 181 lb (82.1 kg)  03/28/22 175 lb (79.4 kg)  12/02/21 180 lb 3.2 oz (81.7 kg)     Kidney Function Lab Results  Component Value Date/Time   CREATININE 1.20 05/19/2022 08:36 AM   CREATININE 1.11 10/15/2021 11:00 AM   CREATININE 1.14 01/26/2020 01:38 PM   GFR 53.34 (L) 10/03/2021 02:11 PM   GFRNONAA >60 10/15/2021 11:00 AM   GFRNONAA 64 01/26/2020 01:38 PM   GFRAA 74 01/26/2020 01:38 PM       Latest Ref Rng & Units 05/19/2022    8:36 AM 10/15/2021   11:00 AM 10/03/2021    2:11 PM  BMP  Glucose 70 - 99 mg/dL  150  178   BUN 8 - 23 mg/dL  34  37   Creatinine 0.61 - 1.24  mg/dL 1.20  1.11  1.32   Sodium 135 - 145 mmol/L  140  134   Potassium 3.5 - 5.1 mmol/L  3.8  3.9   Chloride 98 - 111 mmol/L  110  96   CO2 22 - 32 mmol/L  23  29   Calcium 8.9 - 10.3 mg/dL  9.6  10.6    [place] Pharmacist Assistant [phone]

## 2022-05-30 NOTE — Patient Instructions (Signed)
Health Maintenance Due  Topic Date Due   Diabetic kidney evaluation - Urine ACR  Never done   DTaP/Tdap/Td (2 - Td or Tdap) 06/15/2021   COVID-19 Vaccine (5 - 2023-24 season) 12/20/2021      Row Labels 04/23/2022   11:28 AM 03/28/2022   11:31 AM 10/03/2021    1:33 PM  Depression screen PHQ 2/9   Section Header. No data exists in this row.     Decreased Interest   0 0 2  Down, Depressed, Hopeless   0 0 2  PHQ - 2 Score   0 0 4  Altered sleeping     2  Tired, decreased energy     2  Change in appetite     2  Feeling bad or failure about yourself      1  Trouble concentrating     0  Moving slowly or fidgety/restless     2  Suicidal thoughts     1  PHQ-9 Score     14  Difficult doing work/chores     Very difficult

## 2022-06-16 ENCOUNTER — Other Ambulatory Visit (HOSPITAL_COMMUNITY): Payer: Self-pay | Admitting: Nurse Practitioner

## 2022-07-02 ENCOUNTER — Telehealth: Payer: Self-pay | Admitting: *Deleted

## 2022-07-02 NOTE — Telephone Encounter (Signed)
Pharmacy please advise on holding Xarelto prior to left parotidectomy scheduled for TBD. Thank you.

## 2022-07-02 NOTE — Telephone Encounter (Signed)
Patient with diagnosis of afib on Xarelto for anticoagulation.    Procedure: left parotidectomy Date of procedure: TBD  CHA2DS2-VASc Score = 3  This indicates a 3.2% annual risk of stroke. The patient's score is based upon: CHF History: 0 HTN History: 1 Diabetes History: 1 Stroke History: 0 Vascular Disease History: 0 Age Score: 1 Gender Score: 0  CrCl 1m/min Platelet count 316K  Per office protocol, patient can hold Xarelto for 2-3 days prior to procedure.    **This guidance is not considered finalized until pre-operative APP has relayed final recommendations.**

## 2022-07-02 NOTE — Telephone Encounter (Signed)
   Pre-operative Risk Assessment    Patient Name: Jose Evans  DOB: 1945/06/12 MRN: [id number]      Request for Surgical Clearance    Procedure:   LEFT PAROTIDECTOMY   Date of Surgery:  Clearance TBD                                 Surgeon:  DR. Melida Quitter Surgeon's Group or Practice Name:  [facility] Phone number:  [phone] Fax number:  [phone]  ATTN:  [name]   Type of Clearance Requested:   - Pharmacy:  Hold Rivaroxaban (Xarelto) NOT INDICATED HOW LONG   Type of Anesthesia:  Not Indicated   Additional requests/questions:    [name]   07/02/2022, 1:44 PM

## 2022-07-02 NOTE — Telephone Encounter (Signed)
   Patient Name: Jose Evans  DOB: 05-Aug-1945 MRN: [id number]  Primary Cardiologist: None  Clinical pharmacists have reviewed the patient's past medical history, labs, and current medications as part of preoperative protocol coverage. The following recommendations have been made:  Per office protocol, patient can hold Xarelto for 2-3 days prior to procedure.      I will route this recommendation to the requesting party via Epic fax function and remove from pre-op pool.  Please call with questions.  Mable Fill, Marissa Nestle, NP 07/02/2022, 3:28 PM

## 2022-07-20 ENCOUNTER — Other Ambulatory Visit (HOSPITAL_COMMUNITY): Payer: Self-pay | Admitting: Nurse Practitioner

## 2022-07-24 ENCOUNTER — Other Ambulatory Visit: Payer: Self-pay | Admitting: Adult Health

## 2022-07-24 DIAGNOSIS — I10 Essential (primary) hypertension: Secondary | ICD-10-CM

## 2022-07-27 ENCOUNTER — Other Ambulatory Visit: Payer: Self-pay | Admitting: Adult Health

## 2022-07-27 DIAGNOSIS — M1A20X0 Drug-induced chronic gout, unspecified site, without tophus (tophi): Secondary | ICD-10-CM

## 2022-08-07 ENCOUNTER — Other Ambulatory Visit: Payer: Self-pay | Admitting: Otolaryngology

## 2022-08-08 ENCOUNTER — Other Ambulatory Visit: Payer: Self-pay | Admitting: Adult Health

## 2022-08-08 DIAGNOSIS — G8929 Other chronic pain: Secondary | ICD-10-CM

## 2022-08-08 DIAGNOSIS — G479 Sleep disorder, unspecified: Secondary | ICD-10-CM

## 2022-08-27 ENCOUNTER — Telehealth: Payer: Self-pay | Admitting: Cardiology

## 2022-08-27 ENCOUNTER — Other Ambulatory Visit: Payer: Self-pay | Admitting: *Deleted

## 2022-08-27 DIAGNOSIS — I480 Paroxysmal atrial fibrillation: Secondary | ICD-10-CM

## 2022-08-27 MED ORDER — RIVAROXABAN 20 MG PO TABS
20.0000 mg | ORAL_TABLET | Freq: Every day | ORAL | 5 refills | Status: DC
Start: 2022-08-27 — End: 2023-04-27

## 2022-08-27 NOTE — Telephone Encounter (Signed)
Xarelto 20mg  refill request received. Pt is 77 years old, weight-82.1kg, Crea-1.20 on 05/19/22, last seen by Dr. Elberta Fortis on 12/02/21, Diagnosis-Afib, CrCl- 62.72 mL/min; Dose is appropriate based on dosing criteria. Will send in refill to requested pharmacy.

## 2022-08-27 NOTE — Telephone Encounter (Signed)
Please refer to xarelto encounter from today.

## 2022-08-27 NOTE — Telephone Encounter (Signed)
*  STAT* If patient is at the pharmacy, call can be transferred to refill team.   1. Which medications need to be refilled? (please list name of each medication and dose if known)   XARELTO 20 MG TABS tablet   2. Which pharmacy/location (including street and city if local pharmacy) is medication to be sent to?  [id number] - [place], [place] - [address]   3. Do they need a 30 day or 90 day supply?  30 day  Patient stated he is completely out of this medication.

## 2022-09-22 ENCOUNTER — Other Ambulatory Visit (HOSPITAL_COMMUNITY): Payer: Self-pay | Admitting: Cardiology

## 2022-09-22 ENCOUNTER — Other Ambulatory Visit: Payer: Self-pay | Admitting: Adult Health

## 2022-09-22 DIAGNOSIS — R7303 Prediabetes: Secondary | ICD-10-CM

## 2022-09-23 ENCOUNTER — Encounter: Payer: Self-pay | Admitting: Adult Health

## 2022-09-23 NOTE — Telephone Encounter (Signed)
FYI

## 2022-09-29 ENCOUNTER — Encounter (HOSPITAL_BASED_OUTPATIENT_CLINIC_OR_DEPARTMENT_OTHER): Payer: Self-pay | Admitting: Otolaryngology

## 2022-10-01 ENCOUNTER — Encounter (HOSPITAL_BASED_OUTPATIENT_CLINIC_OR_DEPARTMENT_OTHER)
Admission: RE | Admit: 2022-10-01 | Discharge: 2022-10-01 | Disposition: A | Discharge status: Home / Self Care | Payer: Medicare HMO | Source: Ambulatory Visit | Attending: Otolaryngology | Admitting: Otolaryngology

## 2022-10-01 DIAGNOSIS — Z01812 Encounter for preprocedural laboratory examination: Secondary | ICD-10-CM | POA: Insufficient documentation

## 2022-10-01 LAB — BASIC METABOLIC PANEL
Anion gap: 12 (ref 5–15)
BUN: 32 mg/dL — ABNORMAL HIGH (ref 8–23)
CO2: 24 mmol/L (ref 22–32)
Calcium: 9.4 mg/dL (ref 8.9–10.3)
Chloride: 95 mmol/L — ABNORMAL LOW (ref 98–111)
Creatinine, Ser: 1.09 mg/dL (ref 0.61–1.24)
GFR, Estimated: >60 mL/min (ref >60)
Glucose, Bld: 337 mg/dL — ABNORMAL HIGH (ref 70–99)
Potassium: 4.7 mmol/L (ref 3.5–5.1)
Sodium: 131 mmol/L — ABNORMAL LOW (ref 135–145)

## 2022-10-01 NOTE — Progress Notes (Signed)

## 2022-10-02 NOTE — Progress Notes (Addendum)
Blood Glucose- 337, will recheck day of surgery per Dr. Arby Barrette, Notified [name] at Dr. Jenne Pane office.

## 2022-10-06 NOTE — Anesthesia Preprocedure Evaluation (Signed)
Anesthesia Evaluation  Patient identified by MRN, date of birth, ID band Patient awake    Reviewed: Allergy & Precautions, H&P , NPO status , Patient's Chart, lab work & pertinent test results  Airway Mallampati: II  TM Distance: >3 FB Neck ROM: Full    Dental no notable dental hx. (+) Poor Dentition, Chipped, Loose, Missing,    Pulmonary neg pulmonary ROS, former smoker   Pulmonary exam normal breath sounds clear to auscultation       Cardiovascular Exercise Tolerance: Good hypertension, Pt. on medications negative cardio ROS Normal cardiovascular exam+ dysrhythmias Atrial Fibrillation  Rhythm:Regular Rate:Normal     Neuro/Psych negative neurological ROS  negative psych ROS   GI/Hepatic negative GI ROS, Neg liver ROS,,,  Endo/Other  negative endocrine ROSdiabetes, Well ControlledHypothyroidism    Renal/GU negative Renal ROS  negative genitourinary   Musculoskeletal negative musculoskeletal ROS (+)    Abdominal   Peds negative pediatric ROS (+) ADHD Hematology negative hematology ROS (+)   Anesthesia Other Findings   Reproductive/Obstetrics negative OB ROS                             Anesthesia Physical Anesthesia Plan  ASA: 3  Anesthesia Plan: General   Post-op Pain Management: Tylenol PO (pre-op)* and Celebrex PO (pre-op)*   Induction: Intravenous  PONV Risk Score and Plan: 2 and Ondansetron, Dexamethasone and Treatment may vary due to age or medical condition  Airway Management Planned: Oral ETT  Additional Equipment: None  Intra-op Plan:   Post-operative Plan: Extubation in OR  Informed Consent: I have reviewed the patients History and Physical, chart, labs and discussed the procedure including the risks, benefits and alternatives for the proposed anesthesia with the patient or authorized representative who has indicated his/her understanding and acceptance.        Plan Discussed with: Anesthesiologist and CRNA  Anesthesia Plan Comments:         Anesthesia Quick Evaluation

## 2022-10-07 ENCOUNTER — Telehealth: Payer: Self-pay | Admitting: Adult Health

## 2022-10-07 ENCOUNTER — Ambulatory Visit (HOSPITAL_BASED_OUTPATIENT_CLINIC_OR_DEPARTMENT_OTHER): Payer: Medicare HMO | Admitting: Anesthesiology

## 2022-10-07 ENCOUNTER — Encounter (HOSPITAL_BASED_OUTPATIENT_CLINIC_OR_DEPARTMENT_OTHER): Payer: Self-pay | Admitting: Otolaryngology

## 2022-10-07 ENCOUNTER — Other Ambulatory Visit: Payer: Self-pay

## 2022-10-07 ENCOUNTER — Encounter (HOSPITAL_BASED_OUTPATIENT_CLINIC_OR_DEPARTMENT_OTHER)
Admission: RE | Disposition: A | Discharge status: Home / Self Care | Payer: Self-pay | Source: Home / Self Care | Attending: Otolaryngology

## 2022-10-07 ENCOUNTER — Ambulatory Visit (HOSPITAL_BASED_OUTPATIENT_CLINIC_OR_DEPARTMENT_OTHER)
Admission: RE | Admit: 2022-10-07 | Discharge: 2022-10-07 | Disposition: A | Discharge status: Home / Self Care | Payer: Medicare HMO | Attending: Otolaryngology | Admitting: Otolaryngology

## 2022-10-07 DIAGNOSIS — E039 Hypothyroidism, unspecified: Secondary | ICD-10-CM | POA: Insufficient documentation

## 2022-10-07 DIAGNOSIS — K1123 Chronic sialoadenitis: Secondary | ICD-10-CM | POA: Insufficient documentation

## 2022-10-07 DIAGNOSIS — Z87891 Personal history of nicotine dependence: Secondary | ICD-10-CM

## 2022-10-07 DIAGNOSIS — I10 Essential (primary) hypertension: Secondary | ICD-10-CM | POA: Insufficient documentation

## 2022-10-07 DIAGNOSIS — D110 Benign neoplasm of parotid gland: Secondary | ICD-10-CM | POA: Diagnosis not present

## 2022-10-07 DIAGNOSIS — E119 Type 2 diabetes mellitus without complications: Secondary | ICD-10-CM

## 2022-10-07 DIAGNOSIS — I4891 Unspecified atrial fibrillation: Secondary | ICD-10-CM | POA: Insufficient documentation

## 2022-10-07 DIAGNOSIS — Z7901 Long term (current) use of anticoagulants: Secondary | ICD-10-CM | POA: Diagnosis not present

## 2022-10-07 DIAGNOSIS — F909 Attention-deficit hyperactivity disorder, unspecified type: Secondary | ICD-10-CM | POA: Insufficient documentation

## 2022-10-07 DIAGNOSIS — K118 Other diseases of salivary glands: Secondary | ICD-10-CM | POA: Diagnosis not present

## 2022-10-07 DIAGNOSIS — D490 Neoplasm of unspecified behavior of digestive system: Secondary | ICD-10-CM | POA: Diagnosis not present

## 2022-10-07 HISTORY — PX: PAROTIDECTOMY: SHX2163

## 2022-10-07 LAB — GLUCOSE, CAPILLARY
Glucose-Capillary: 296 mg/dL — ABNORMAL HIGH (ref 70–99)
Glucose-Capillary: 313 mg/dL — ABNORMAL HIGH (ref 70–99)

## 2022-10-07 SURGERY — EXCISION, PAROTID GLAND
Anesthesia: General | Site: Neck | Laterality: Left

## 2022-10-07 MED ORDER — BACITRACIN ZINC 500 UNIT/GM EX OINT
TOPICAL_OINTMENT | CUTANEOUS | Status: AC
  Filled 2022-10-07: qty 28.35

## 2022-10-07 MED ORDER — MEPERIDINE HCL 25 MG/ML IJ SOLN: 6.2500 mg | INTRAMUSCULAR | Status: DC | PRN

## 2022-10-07 MED ORDER — FENTANYL CITRATE (PF) 100 MCG/2ML IJ SOLN
INTRAMUSCULAR | Status: AC
  Filled 2022-10-07: qty 2

## 2022-10-07 MED ORDER — EPHEDRINE SULFATE-NACL 50-0.9 MG/10ML-% IV SOSY
PREFILLED_SYRINGE | INTRAVENOUS | Status: DC | PRN
  Administered 2022-10-07: 10 mg via INTRAVENOUS

## 2022-10-07 MED ORDER — LACTATED RINGERS IV SOLN: INTRAVENOUS | Status: DC

## 2022-10-07 MED ORDER — CELECOXIB 200 MG PO CAPS
ORAL_CAPSULE | ORAL | Status: AC
  Filled 2022-10-07: qty 1

## 2022-10-07 MED ORDER — CEFAZOLIN SODIUM-DEXTROSE 2-4 GM/100ML-% IV SOLN
INTRAVENOUS | Status: AC
  Filled 2022-10-07: qty 100

## 2022-10-07 MED ORDER — PHENYLEPHRINE 80 MCG/ML (10ML) SYRINGE FOR IV PUSH (FOR BLOOD PRESSURE SUPPORT)
PREFILLED_SYRINGE | INTRAVENOUS | Status: DC | PRN
  Administered 2022-10-07: 240 ug via INTRAVENOUS
  Administered 2022-10-07: 160 ug via INTRAVENOUS

## 2022-10-07 MED ORDER — ONDANSETRON HCL 4 MG/2ML IJ SOLN
INTRAMUSCULAR | Status: AC
  Filled 2022-10-07: qty 2

## 2022-10-07 MED ORDER — OXYCODONE HCL 5 MG/5ML PO SOLN: 5.0000 mg | Freq: Once | ORAL | Status: AC | PRN

## 2022-10-07 MED ORDER — PROPOFOL 10 MG/ML IV BOLUS
INTRAVENOUS | Status: DC | PRN
  Administered 2022-10-07: 160 mg via INTRAVENOUS

## 2022-10-07 MED ORDER — 0.9 % SODIUM CHLORIDE (POUR BTL) OPTIME
TOPICAL | Status: DC | PRN
  Administered 2022-10-07: 200 mL

## 2022-10-07 MED ORDER — OXYCODONE HCL 5 MG PO TABS
5.0000 mg | ORAL_TABLET | Freq: Once | ORAL | Status: AC | PRN
  Administered 2022-10-07: 5 mg via ORAL

## 2022-10-07 MED ORDER — LIDOCAINE-EPINEPHRINE 1 %-1:100000 IJ SOLN
INTRAMUSCULAR | Status: DC | PRN
  Administered 2022-10-07: 3.5 mL

## 2022-10-07 MED ORDER — BACITRACIN ZINC 500 UNIT/GM EX OINT
TOPICAL_OINTMENT | CUTANEOUS | Status: DC | PRN
  Administered 2022-10-07: 1 via TOPICAL

## 2022-10-07 MED ORDER — ACETAMINOPHEN 500 MG PO TABS
ORAL_TABLET | ORAL | Status: AC
  Filled 2022-10-07: qty 2

## 2022-10-07 MED ORDER — FENTANYL CITRATE (PF) 100 MCG/2ML IJ SOLN
INTRAMUSCULAR | Status: DC | PRN
  Administered 2022-10-07 (×3): 50 ug via INTRAVENOUS
  Administered 2022-10-07: 100 ug via INTRAVENOUS
  Administered 2022-10-07: 50 ug via INTRAVENOUS

## 2022-10-07 MED ORDER — OXYCODONE HCL 5 MG PO TABS
ORAL_TABLET | ORAL | Status: AC
  Filled 2022-10-07: qty 1

## 2022-10-07 MED ORDER — LIDOCAINE 2% (20 MG/ML) 5 ML SYRINGE
INTRAMUSCULAR | Status: DC | PRN
  Administered 2022-10-07: 100 mg via INTRAVENOUS

## 2022-10-07 MED ORDER — LIDOCAINE 2% (20 MG/ML) 5 ML SYRINGE
INTRAMUSCULAR | Status: AC
  Filled 2022-10-07: qty 5

## 2022-10-07 MED ORDER — ACETAMINOPHEN 160 MG/5ML PO SOLN: 325.0000 mg | ORAL | Status: DC | PRN

## 2022-10-07 MED ORDER — GLYCOPYRROLATE PF 0.2 MG/ML IJ SOSY
PREFILLED_SYRINGE | INTRAMUSCULAR | Status: AC
  Filled 2022-10-07: qty 1

## 2022-10-07 MED ORDER — FENTANYL CITRATE (PF) 100 MCG/2ML IJ SOLN: 25.0000 ug | INTRAMUSCULAR | Status: DC | PRN

## 2022-10-07 MED ORDER — ROCURONIUM BROMIDE 10 MG/ML (PF) SYRINGE
PREFILLED_SYRINGE | INTRAVENOUS | Status: AC
  Filled 2022-10-07: qty 10

## 2022-10-07 MED ORDER — LIDOCAINE-EPINEPHRINE 1 %-1:100000 IJ SOLN
INTRAMUSCULAR | Status: AC
  Filled 2022-10-07: qty 2

## 2022-10-07 MED ORDER — SUCCINYLCHOLINE CHLORIDE 200 MG/10ML IV SOSY
PREFILLED_SYRINGE | INTRAVENOUS | Status: AC
  Filled 2022-10-07: qty 10

## 2022-10-07 MED ORDER — ACETAMINOPHEN 325 MG PO TABS: 325.0000 mg | ORAL_TABLET | ORAL | Status: DC | PRN

## 2022-10-07 MED ORDER — GLYCOPYRROLATE PF 0.2 MG/ML IJ SOSY
PREFILLED_SYRINGE | INTRAMUSCULAR | Status: DC | PRN
  Administered 2022-10-07: .2 mg via INTRAVENOUS

## 2022-10-07 MED ORDER — SUCCINYLCHOLINE CHLORIDE 200 MG/10ML IV SOSY
PREFILLED_SYRINGE | INTRAVENOUS | Status: DC | PRN
  Administered 2022-10-07: 140 mg via INTRAVENOUS

## 2022-10-07 MED ORDER — DEXAMETHASONE SODIUM PHOSPHATE 10 MG/ML IJ SOLN
INTRAMUSCULAR | Status: DC | PRN
  Administered 2022-10-07: 4 mg via INTRAVENOUS

## 2022-10-07 MED ORDER — ONDANSETRON HCL 4 MG/2ML IJ SOLN: 4.0000 mg | Freq: Once | INTRAMUSCULAR | Status: DC | PRN

## 2022-10-07 MED ORDER — ACETAMINOPHEN 500 MG PO TABS
1000.0000 mg | ORAL_TABLET | Freq: Once | ORAL | Status: AC
  Administered 2022-10-07: 1000 mg via ORAL

## 2022-10-07 MED ORDER — CEFAZOLIN SODIUM-DEXTROSE 2-4 GM/100ML-% IV SOLN
2.0000 g | INTRAVENOUS | Status: AC
  Administered 2022-10-07: 2 g via INTRAVENOUS

## 2022-10-07 MED ORDER — DEXAMETHASONE SODIUM PHOSPHATE 10 MG/ML IJ SOLN
INTRAMUSCULAR | Status: AC
  Filled 2022-10-07: qty 1

## 2022-10-07 MED ORDER — CELECOXIB 200 MG PO CAPS
200.0000 mg | ORAL_CAPSULE | Freq: Once | ORAL | Status: AC
  Administered 2022-10-07: 200 mg via ORAL

## 2022-10-07 MED ORDER — EPHEDRINE 5 MG/ML INJ
INTRAVENOUS | Status: AC
  Filled 2022-10-07: qty 5

## 2022-10-07 MED ORDER — HYDROCODONE-ACETAMINOPHEN 5-325 MG PO TABS: 1.0000 | ORAL_TABLET | Freq: Four times a day (QID) | ORAL | 0 refills | Status: DC | PRN

## 2022-10-07 MED ORDER — PHENYLEPHRINE 80 MCG/ML (10ML) SYRINGE FOR IV PUSH (FOR BLOOD PRESSURE SUPPORT)
PREFILLED_SYRINGE | INTRAVENOUS | Status: AC
  Filled 2022-10-07: qty 10

## 2022-10-07 MED ORDER — ONDANSETRON HCL 4 MG/2ML IJ SOLN
INTRAMUSCULAR | Status: DC | PRN
  Administered 2022-10-07: 4 mg via INTRAVENOUS

## 2022-10-07 SURGICAL SUPPLY — 82 items
ADH SKN CLS APL DERMABOND .7 (GAUZE/BANDAGES/DRESSINGS) ×1
APL SKNCLS STERI-STRIP NONHPOA (GAUZE/BANDAGES/DRESSINGS)
APL SWBSTK 6 STRL LF DISP (MISCELLANEOUS) ×1
APPLICATOR COTTON TIP 6 STRL (MISCELLANEOUS) ×2 IMPLANT
APPLICATOR COTTON TIP 6IN STRL (MISCELLANEOUS) ×1
ATTRACTOMAT 16X20 MAGNETIC DRP (DRAPES) IMPLANT
BENZOIN TINCTURE PRP APPL 2/3 (GAUZE/BANDAGES/DRESSINGS) IMPLANT
BLADE SURG 12 STRL SS (BLADE) IMPLANT
BLADE SURG 15 STRL LF DISP TIS (BLADE) ×2 IMPLANT
BLADE SURG 15 STRL SS (BLADE) ×1
CANISTER SUCT 1200ML W/VALVE (MISCELLANEOUS) ×2 IMPLANT
CLEANER CAUTERY TIP 5X5 PAD (MISCELLANEOUS) IMPLANT
CLIP TI MEDIUM 6 (CLIP) IMPLANT
CLIP TI WIDE RED SMALL 6 (CLIP) IMPLANT
CORD BIPOLAR FORCEPS 12FT (ELECTRODE) ×2 IMPLANT
COVER BACK TABLE 60X90IN (DRAPES) ×2 IMPLANT
COVER MAYO STAND STRL (DRAPES) ×2 IMPLANT
DERMABOND ADVANCED .7 DNX12 (GAUZE/BANDAGES/DRESSINGS) IMPLANT
DRAIN CHANNEL 7F FF FLAT (WOUND CARE) IMPLANT
DRAIN JACKSON RD 7FR 3/32 (WOUND CARE) IMPLANT
DRAIN JP 10F RND SILICONE (MISCELLANEOUS) IMPLANT
DRAIN JP 7F FLT 3/4 PRF SI HBL (DRAIN) IMPLANT
DRAIN PENROSE 12X.25 LTX STRL (MISCELLANEOUS) IMPLANT
DRAPE INCISE 23X17 STRL (DRAPES) ×2 IMPLANT
DRAPE INCISE IOBAN 23X17 STRL (DRAPES) ×1 IMPLANT
DRAPE U-SHAPE 76X120 STRL (DRAPES) ×2 IMPLANT
ELECT COATED BLADE 2.86 ST (ELECTRODE) ×2 IMPLANT
ELECT PAIRED SUBDERMAL (MISCELLANEOUS) ×1
ELECT REM PT RETURN 9FT ADLT (ELECTROSURGICAL) ×1
ELECTRODE PAIRED SUBDERMAL (MISCELLANEOUS) ×2 IMPLANT
ELECTRODE REM PT RTRN 9FT ADLT (ELECTROSURGICAL) ×2 IMPLANT
EVACUATOR 3/16 PVC DRAIN (DRAIN) IMPLANT
EVACUATOR SILICONE 100CC (DRAIN) IMPLANT
GAUZE 4X4 16PLY ~~LOC~~+RFID DBL (SPONGE) IMPLANT
GLOVE BIO SURGEON STRL SZ 6 (GLOVE) IMPLANT
GLOVE BIO SURGEON STRL SZ 6.5 (GLOVE) IMPLANT
GLOVE BIO SURGEON STRL SZ7 (GLOVE) IMPLANT
GLOVE BIO SURGEON STRL SZ7.5 (GLOVE) ×2 IMPLANT
GLOVE BIOGEL PI IND STRL 7.0 (GLOVE) IMPLANT
GLOVE BIOGEL PI IND STRL 8 (GLOVE) IMPLANT
GLOVE ECLIPSE 6.5 STRL STRAW (GLOVE) IMPLANT
GLOVE ECLIPSE 7.5 STRL STRAW (GLOVE) IMPLANT
GLOVE SS BIOGEL STRL SZ 7.5 (GLOVE) IMPLANT
GOWN STRL REUS W/ TWL LRG LVL3 (GOWN DISPOSABLE) IMPLANT
GOWN STRL REUS W/ TWL XL LVL3 (GOWN DISPOSABLE) IMPLANT
GOWN STRL REUS W/TWL LRG LVL3 (GOWN DISPOSABLE) ×3
GOWN STRL REUS W/TWL XL LVL3 (GOWN DISPOSABLE)
LOCATOR NERVE 3 VOLT (DISPOSABLE) IMPLANT
NDL HYPO 25X1 1.5 SAFETY (NEEDLE) ×2 IMPLANT
NEEDLE HYPO 25X1 1.5 SAFETY (NEEDLE) ×1 IMPLANT
NS IRRIG 1000ML POUR BTL (IV SOLUTION) ×2 IMPLANT
PACK BASIN DAY SURGERY FS (CUSTOM PROCEDURE TRAY) ×2 IMPLANT
PENCIL SMOKE EVACUATOR (MISCELLANEOUS) ×2 IMPLANT
PIN SAFETY STERILE (MISCELLANEOUS) IMPLANT
PROBE NERVBE PRASS .33 (MISCELLANEOUS) IMPLANT
SLEEVE SCD COMPRESS KNEE MED (STOCKING) IMPLANT
SPIKE FLUID TRANSFER (MISCELLANEOUS) IMPLANT
STAPLER VISISTAT 35W (STAPLE) IMPLANT
STRIP CLOSURE SKIN 1/4X4 (GAUZE/BANDAGES/DRESSINGS) IMPLANT
SUT CHROMIC 4 0 PS 2 18 (SUTURE) IMPLANT
SUT ETHILON 2 0 FS 18 (SUTURE) IMPLANT
SUT ETHILON 3 0 PS 1 (SUTURE) ×2 IMPLANT
SUT ETHILON 4 0 PS 2 18 (SUTURE) IMPLANT
SUT NYLON ETHILON 5-0 P-3 1X18 (SUTURE) IMPLANT
SUT PROLENE 4 0 P 3 18 (SUTURE) IMPLANT
SUT PROLENE 5 0 P 3 (SUTURE) IMPLANT
SUT SILK 2 0 TIES 17X18 (SUTURE)
SUT SILK 2-0 18XBRD TIE BLK (SUTURE) IMPLANT
SUT SILK 3 0 PS 1 (SUTURE) ×2 IMPLANT
SUT SILK 3 0 TIES 17X18 (SUTURE) ×1
SUT SILK 3-0 18XBRD TIE BLK (SUTURE) ×2 IMPLANT
SUT SILK 4 0 TIES 17X18 (SUTURE) ×2 IMPLANT
SUT VIC AB 3-0 FS2 27 (SUTURE) ×2 IMPLANT
SUT VIC AB 4-0 P-3 18XBRD (SUTURE) IMPLANT
SUT VIC AB 4-0 P3 18 (SUTURE)
SUT VIC AB 4-0 PS2 18 (SUTURE) IMPLANT
SYR BULB EAR ULCER 3OZ GRN STR (SYRINGE) ×2 IMPLANT
SYR CONTROL 10ML LL (SYRINGE) ×2 IMPLANT
TOWEL GREEN STERILE FF (TOWEL DISPOSABLE) ×2 IMPLANT
TRAY DSU PREP LF (CUSTOM PROCEDURE TRAY) ×2 IMPLANT
TUBE CONNECTING 20X1/4 (TUBING) ×2 IMPLANT
YANKAUER SUCT BULB TIP NO VENT (SUCTIONS) IMPLANT

## 2022-10-07 NOTE — Discharge Instructions (Addendum)
About my Jackson-Pratt Bulb Drain  What is a Jackson-Pratt bulb? A Jackson-Pratt is a soft, round device used to collect drainage. It is connected to a long, thin drainage catheter, which is held in place by one or two small stiches near your surgical incision site. When the bulb is squeezed, it forms a vacuum, forcing the drainage to empty into the bulb.  Emptying the Jackson-Pratt bulb- To empty the bulb: 1. Release the plug on the top of the bulb. 2. Pour the bulb's contents into a measuring container which your nurse will provide. 3. Record the time emptied and amount of drainage. Empty the drain(s) as often as your     doctor or nurse recommends.  Date                  Time                    Amount (Drain 1)                 Amount (Drain 2)  _____________________________________________________________________  _____________________________________________________________________  _____________________________________________________________________  _____________________________________________________________________  _____________________________________________________________________  _____________________________________________________________________  _____________________________________________________________________  _____________________________________________________________________  Squeezing the Jackson-Pratt Bulb- To squeeze the bulb: 1. Make sure the plug at the top of the bulb is open. 2. Squeeze the bulb tightly in your fist. You will hear air squeezing from the bulb. 3. Replace the plug while the bulb is squeezed. 4. Use a safety pin to attach the bulb to your clothing. This will keep the catheter from     pulling at the bulb insertion site.  When to call your doctor- Call your doctor if: Drain site becomes red, swollen or hot. You have a fever greater than 101 degrees F. There is oozing at the drain site. Drain falls out (apply a guaze bandage  over the drain hole and secure it with tape). Drainage increases daily not related to activity patterns. (You will usually have more drainage when you are active than when you are resting.) Drainage has a bad odor.  Post Anesthesia Home Care Instructions  Activity: Get plenty of rest for the remainder of the day. A responsible individual must stay with you for 24 hours following the procedure.  For the next 24 hours, DO NOT: -Drive a car -Advertising copywriter -Drink alcoholic beverages -Take any medication unless instructed by your physician -Make any legal decisions or sign important papers.  Meals: Start with liquid foods such as gelatin or soup. Progress to regular foods as tolerated. Avoid greasy, spicy, heavy foods. If nausea and/or vomiting occur, drink only clear liquids until the nausea and/or vomiting subsides. Call your physician if vomiting continues.  Special Instructions/Symptoms: Your throat may feel dry or sore from the anesthesia or the breathing tube placed in your throat during surgery. If this causes discomfort, gargle with warm salt water. The discomfort should disappear within 24 hours.  If you had a scopolamine patch placed behind your ear for the management of post- operative nausea and/or vomiting:  1. The medication in the patch is effective for 72 hours, after which it should be removed.  Wrap patch in a tissue and discard in the trash. Wash hands thoroughly with soap and water. 2. You may remove the patch earlier than 72 hours if you experience unpleasant side effects which may include dry mouth, dizziness or visual disturbances. 3. Avoid touching the patch. Wash your hands with soap and water after contact with the patch.     May have  Tylenol/ Ibuprofen today after 12:30 PM

## 2022-10-07 NOTE — H&P (Signed)
Jose Evans is an 77 y.o. male.   Chief Complaint: Parotid mass HPI: 77 year old male with left parotid tail mass for several years that has increased slowly in size.  Past Medical History:  Diagnosis Date   Adult acne    Cancer (HCC) 07/2017   skin cancer   Diabetes mellitus without complication (HCC)    Dyspnea    Gout    Hypertension    Hypothyroidism    Thyroid disease     Past Surgical History:  Procedure Laterality Date   HERNIA REPAIR  2007   x2   SHOULDER SURGERY Right 2018   TONSILLECTOMY     age 21    Family History  Problem Relation Age of Onset   Hypertension Other    Colon cancer Neg Hx    Rectal cancer Neg Hx    Stomach cancer Neg Hx    Esophageal cancer Neg Hx    Social History:  reports that he quit smoking about 17 years ago. His smoking use included cigarettes. He has a 15.00 pack-year smoking history. He has never used smokeless tobacco. He reports current alcohol use. He reports that he does not use drugs.  Allergies:  Allergies  Allergen Reactions   Glucophage [Metformin] Diarrhea    Medications Prior to Admission  Medication Sig Dispense Refill   acetaminophen (TYLENOL) 500 MG tablet Take 1,000 mg by mouth every 6 (six) hours as needed for mild pain.     allopurinol (ZYLOPRIM) 300 MG tablet TAKE 1 TABLET BY MOUTH EVERY DAY 90 tablet 3   diltiazem (CARDIZEM SR) 120 MG 12 hr capsule TAKE 1 CAPSULE BY MOUTH EVERY DAY 90 capsule 1   finasteride (PROSCAR) 5 MG tablet TAKE 1 TABLET (5 MG TOTAL) BY MOUTH DAILY. 90 tablet 3   levothyroxine (SYNTHROID) 150 MCG tablet Take 1 tablet (150 mcg total) by mouth daily. 90 tablet 3   lisinopril (ZESTRIL) 5 MG tablet TAKE 1 TABLET (5 MG TOTAL) BY MOUTH DAILY. 90 tablet 0   metoprolol succinate (TOPROL-XL) 50 MG 24 hr tablet TAKE 1 TABLET BY MOUTH EVERY DAY 30 tablet 1   traMADol (ULTRAM) 50 MG tablet TAKE 1 TABLET BY MOUTH EVERY 8 HOURS AS NEEDED. 60 tablet 2   traZODone (DESYREL) 50 MG tablet TAKE 1 TABLET  BY MOUTH EVERY DAY AT BEDTIME AS NEEDED FOR SLEEP 90 tablet 1   triamterene-hydrochlorothiazide (MAXZIDE) 75-50 MG tablet TAKE 1 TABLET BY MOUTH EVERY DAY 90 tablet 1   amphetamine-dextroamphetamine (ADDERALL) 10 MG tablet Take 1 tablet (10 mg total) by mouth daily with breakfast. 30 tablet 0   amphetamine-dextroamphetamine (ADDERALL) 10 MG tablet Take 1 tablet (10 mg total) by mouth daily with breakfast. 30 tablet 0   amphetamine-dextroamphetamine (ADDERALL) 10 MG tablet Take 1 tablet (10 mg total) by mouth daily with breakfast. 30 tablet 0   rivaroxaban (XARELTO) 20 MG TABS tablet Take 1 tablet (20 mg total) by mouth daily with supper. 30 tablet 5    Results for orders placed or performed during the hospital encounter of 10/07/22 (from the past 48 hour(s))  Glucose, capillary     Status: Abnormal   Collection Time: 10/07/22  6:09 AM  Result Value Ref Range   Glucose-Capillary 296 (H) 70 - 99 mg/dL    Comment: Glucose reference range applies only to samples taken after fasting for at least 8 hours.   Comment 1 Notify RN    Comment 2 Document in Chart    No  results found.  Review of Systems  All other systems reviewed and are negative.   Blood pressure 125/75, pulse (!) 58, temperature 97.9 F (36.6 C), temperature source Oral, resp. rate 18, height 5' 9.5" (1.765 m), weight 76.4 kg, SpO2 100 %. Physical Exam Constitutional:      Appearance: Normal appearance. He is normal weight.  HENT:     Head: Normocephalic and atraumatic.     Right Ear: External ear normal.     Left Ear: External ear normal.     Nose: Nose normal.     Mouth/Throat:     Mouth: Mucous membranes are moist.     Pharynx: Oropharynx is clear.  Eyes:     Extraocular Movements: Extraocular movements intact.     Conjunctiva/sclera: Conjunctivae normal.     Pupils: Pupils are equal, round, and reactive to light.  Cardiovascular:     Rate and Rhythm: Normal rate.  Pulmonary:     Effort: Pulmonary effort is  normal.  Skin:    General: Skin is warm and dry.  Neurological:     General: No focal deficit present.     Mental Status: He is alert and oriented to person, place, and time.  Psychiatric:        Mood and Affect: Mood normal.        Behavior: Behavior normal.        Thought Content: Thought content normal.        Judgment: Judgment normal.      Assessment/Plan Left parotid mass  To OR for left parotidectomy.  Christia Reading, MD 10/07/2022, 7:32 AM

## 2022-10-07 NOTE — Anesthesia Postprocedure Evaluation (Signed)
Anesthesia Post Note  Patient: Jose Evans  Procedure(s) Performed: LEFT PAROTIDECTOMY (Left: Neck)     Patient location during evaluation: PACU Anesthesia Type: General Level of consciousness: awake and alert Pain management: pain level controlled Vital Signs Assessment: post-procedure vital signs reviewed and stable Respiratory status: spontaneous breathing, nonlabored ventilation, respiratory function stable and patient connected to nasal cannula oxygen Cardiovascular status: blood pressure returned to baseline and stable Postop Assessment: no apparent nausea or vomiting Anesthetic complications: no   No notable events documented.  Last Vitals:  Vitals:   10/07/22 0930 10/07/22 1045  BP: (!) 105/58 113/78  Pulse: 67 66  Resp: 13 16  Temp:  (!) 36.1 C  SpO2: 96% 97%    Last Pain:  Vitals:   10/07/22 1036  TempSrc:   PainSc: 7                  Kaylene Dawn

## 2022-10-07 NOTE — Op Note (Signed)
Preop diagnosis: Left parotid neoplasm Postop diagnosis: same Procedure: Left superficial parotidectomy with dissection of facial nerve Surgeon: Jenne Pane Assist: RNFA Anesth: General and local with 1% lidocaine with 1:100,000 epinephrine Compl: None Findings: Left lower parotid with 2 cm round mass Description:  After discussing risks, benefits, and alternatives, the patient was brought to the operative suite and placed on the operative table in the supine position.  Anesthesia was induced and the patient was intubated by the anesthesia team without difficulty.  The patient was given intravenous antibiotics.  The left parotid incision was marked with a marking pen and injected with local anesthetic.  The nerve integrity monitor was placed in the face for facial nerve monitoring and turned on during the case.  The left face and neck were prepped and draped in sterile fashion.  The incision was made with a 15 blade scalpel and extended through the subcutaneous and platysma layers with Bovie electrocautery.  A pre-parotid flap was elevated anteriorly and the earlobe was freed.  Skin flaps were sutured back with stay sutures.  Dissection was then performed anterior to the tragus and parotid tissue was elevated off of the mastoid.  With further dissection to the stylomastoid foramen, the main trunk of the facial nerve was identified.  The nerve was dissected to the main division and then down the inferior branches, dividing parotid tissue using the harmonic scalpel and bipolar electrocautery.  The marginal mandibular nerve was traced in an antegrade fashion until the inferior gland was able to be freed and the nerve kept intact.  The nerve stimulator was used for assistance.  The inferior parotid gland with mass was then dissected from surrounding tissues and removed.  This was passed to nursing for pathology.  The surgical site was then irrigated copiously with saline.  A 10 French suction drain was placed in the  depth of the wound and secured at the skin with 2-0 Nylon with a standard drain stitch.  The flaps were released and laid back down.  The platysma/subcutaneous layer was closed with 4-0 Vicryl in a simple, running fashion.  The skin was closed with 5-0 Nylon in a simple, running fashion.  The drain was placed to bulb suction.  Bacitracin ointment was added to the incision.  Drapes were removed and the drain was secured to the shoulder with tape.  He was then returned to anesthesia for wake-up and was extubated and moved to the recovery room in stable condition.

## 2022-10-07 NOTE — Transfer of Care (Signed)
Immediate Anesthesia Transfer of Care Note  Patient: Jose Evans  Procedure(s) Performed: LEFT PAROTIDECTOMY (Left: Neck)  Patient Location: PACU  Anesthesia Type:General  Level of Consciousness: awake, alert , and oriented  Airway & Oxygen Therapy: Patient Spontanous Breathing and Patient connected to face mask oxygen  Post-op Assessment: Report given to RN and Post -op Vital signs reviewed and stable  Post vital signs: Reviewed and stable  Last Vitals:  Vitals Value Taken Time  BP 122/74   Temp    Pulse 72   Resp 14   SpO2 99%     Last Pain:  Vitals:   10/07/22 0627  TempSrc: Oral  PainSc: 0-No pain         Complications: No notable events documented.

## 2022-10-07 NOTE — Telephone Encounter (Signed)
Patient spouse notified of update  and verbalized understanding. Pt has been scheduled for DM f/u.

## 2022-10-07 NOTE — Anesthesia Procedure Notes (Signed)
Procedure Name: Intubation Date/Time: 10/07/2022 7:47 AM  Performed by: Pearson Grippe, CRNAPre-anesthesia Checklist: Patient identified, Emergency Drugs available, Suction available and Patient being monitored Patient Re-evaluated:Patient Re-evaluated prior to induction Oxygen Delivery Method: Circle system utilized Preoxygenation: Pre-oxygenation with 100% oxygen Induction Type: IV induction Ventilation: Mask ventilation without difficulty Laryngoscope Size: Miller and 2 Grade View: Grade I Tube type: Oral Tube size: 7.5 mm Number of attempts: 1 Airway Equipment and Method: Stylet and Oral airway Placement Confirmation: ETT inserted through vocal cords under direct vision, positive ETCO2 and breath sounds checked- equal and bilateral Secured at: 23 cm Tube secured with: Tape Dental Injury: Teeth and Oropharynx as per pre-operative assessment

## 2022-10-07 NOTE — Telephone Encounter (Signed)
Pt and spouse stated that pt would like to see endo so that pt can have his thyroid and glucose managed at the same visit. Spouse stated that pt TSH and glucose are still not where they need to be. They are requesting referral to endo.

## 2022-10-07 NOTE — Telephone Encounter (Signed)
Pt wife is calling and requesting her husband to see endocrinologist for dm

## 2022-10-07 NOTE — Brief Op Note (Signed)
10/07/2022  9:12 AM  PATIENT:  Jose Evans  77 y.o. male  PRE-OPERATIVE DIAGNOSIS:  Left parotid mass  POST-OPERATIVE DIAGNOSIS:  Left parotid mass  PROCEDURE:  Procedure(s): LEFT PAROTIDECTOMY (Left)  SURGEON:  Surgeon(s) and Role:    Christia Reading, MD - Primary  PHYSICIAN ASSISTANT:   ASSISTANTS: RNFA   ANESTHESIA:   general  EBL:  20 mL   BLOOD ADMINISTERED:none  DRAINS: (10 Fr) Jackson-Pratt drain(s) with closed bulb suction in the left neck    LOCAL MEDICATIONS USED:  LIDOCAINE   SPECIMEN:  Source of Specimen:  Left parotid tail  DISPOSITION OF SPECIMEN:  PATHOLOGY  COUNTS:  YES  TOURNIQUET:  * No tourniquets in log *  DICTATION: .Note written in EPIC  PLAN OF CARE: Discharge to home after PACU  PATIENT DISPOSITION:  PACU - hemodynamically stable.   Delay start of Pharmacological VTE agent (>24hrs) due to surgical blood loss or risk of bleeding: no

## 2022-10-08 ENCOUNTER — Encounter (HOSPITAL_BASED_OUTPATIENT_CLINIC_OR_DEPARTMENT_OTHER): Payer: Self-pay | Admitting: Otolaryngology

## 2022-10-08 LAB — SURGICAL PATHOLOGY

## 2022-10-09 DIAGNOSIS — K118 Other diseases of salivary glands: Secondary | ICD-10-CM | POA: Diagnosis not present

## 2022-10-10 ENCOUNTER — Ambulatory Visit (INDEPENDENT_AMBULATORY_CARE_PROVIDER_SITE_OTHER): Payer: Medicare HMO | Admitting: Adult Health

## 2022-10-10 VITALS — BP 100/70 | HR 77 | Temp 98.2°F | Ht 69.5 in | Wt 168.0 lb

## 2022-10-10 DIAGNOSIS — I10 Essential (primary) hypertension: Secondary | ICD-10-CM | POA: Diagnosis not present

## 2022-10-10 DIAGNOSIS — E039 Hypothyroidism, unspecified: Secondary | ICD-10-CM | POA: Diagnosis not present

## 2022-10-10 DIAGNOSIS — E119 Type 2 diabetes mellitus without complications: Secondary | ICD-10-CM

## 2022-10-10 DIAGNOSIS — Z7984 Long term (current) use of oral hypoglycemic drugs: Secondary | ICD-10-CM | POA: Diagnosis not present

## 2022-10-10 DIAGNOSIS — I480 Paroxysmal atrial fibrillation: Secondary | ICD-10-CM

## 2022-10-10 LAB — MICROALBUMIN / CREATININE URINE RATIO
Creatinine,U: 63.8 mg/dL
Microalb Creat Ratio: 4.5 mg/g (ref 0.0–30.0)
Microalb, Ur: 2.8 mg/dL — ABNORMAL HIGH (ref 0.0–1.9)

## 2022-10-10 LAB — POCT GLYCOSYLATED HEMOGLOBIN (HGB A1C): Hemoglobin A1C: 10.2 % — AB (ref 4.0–5.6)

## 2022-10-10 LAB — TSH: TSH: 0.93 u[IU]/mL (ref 0.35–5.50)

## 2022-10-10 NOTE — Patient Instructions (Signed)
Health Maintenance Due  Topic Date Due   Diabetic kidney evaluation - Urine ACR  Never done   Zoster Vaccines- Shingrix (1 of 2) Never done   DTaP/Tdap/Td (2 - Td or Tdap) 06/15/2021   COVID-19 Vaccine (5 - 2023-24 season) 12/20/2021      Row Labels 10/10/2022   10:22 AM 04/23/2022   11:28 AM 03/28/2022   11:31 AM  Depression screen PHQ 2/9   Section Header. No data exists in this row.     Decreased Interest   0 0 0  Down, Depressed, Hopeless   0 0 0  PHQ - 2 Score   0 0 0  Altered sleeping   0    Tired, decreased energy   0    Change in appetite   0    Feeling bad or failure about yourself    0    Trouble concentrating   0    Moving slowly or fidgety/restless   0    Suicidal thoughts   0    PHQ-9 Score   0    Difficult doing work/chores   Not difficult at all

## 2022-10-10 NOTE — Progress Notes (Signed)
Subjective:    Patient ID: Jose Evans, male    DOB: 09/24/1945, 77 y.o.   MRN: [id number]  HPI  77 year old male who  has a past medical history of Adult acne, Cancer (HCC) (07/2017), Diabetes mellitus without complication (HCC), Dyspnea, Gout, Hypertension, Hypothyroidism, and Thyroid disease.  He presents to the office today for follow-up regarding diabetes/hypertension/hypothyroidism.  DM Type 2 -in the past we did try him on metformin but he stopped this medication due to GI issues.  We then switched him to glipizide 2.5 mg extended release in January 2024.  He has failed to follow-up since. Earlier this week he had a left sided parotidectomy and it was noted that his blood sugars were in the high 200's-300's on spot check. He reports that when he was taking Glipizide it was causing abdominal pain so he stopped the medication and never let us know.. He has been trying to eat healthy and staying active. He has been losing weight.  Lab Results  Component Value Date   HGBA1C 10.2 (A) 10/10/2022   HTN -currently managed with lisinopril 5 mg daily, Maxide 75-50 mg daily, and metoprolol succinate 50 mg daily.  He denies dizziness, lightheadedness, chest pain, or shortness of breath  BP Readings from Last 3 Encounters:  10/10/22 100/70  10/07/22 113/78  04/23/22 110/80   Hypothyroidism -is currently prescribed Synthroid 150 mg daily.He feels controlled.  Lab Results  Component Value Date   TSH 1.09 05/30/2022   Afib - managed by Cardiology. He reports that he stopped Xarelto a while ago due to low back pain. Once he stopped the medication the pain resolved. He also stopped taking Adderall and since this Thyroid level was corrected he reports not having any symptoms of afib.   Review of Systems See HPI   Past Medical History:  Diagnosis Date   Adult acne    Cancer (HCC) 07/2017   skin cancer   Diabetes mellitus without complication (HCC)    Dyspnea    Gout    Hypertension     Hypothyroidism    Thyroid disease     Social History   Socioeconomic History   Marital status: Married    Spouse name: Not on file   Number of children: Not on file   Years of education: Not on file   Highest education level: Associate degree: academic program  Occupational History   Not on file  Tobacco Use   Smoking status: Former    Packs/day: 0.50    Years: 30.00    Additional pack years: 0.00    Total pack years: 15.00    Types: Cigarettes    Quit date: 09/07/2005    Years since quitting: 17.1   Smokeless tobacco: Never  Vaping Use   Vaping Use: Never used  Substance and Sexual Activity   Alcohol use: Yes    Comment: occasional   Drug use: No   Sexual activity: Not on file  Other Topics Concern   Not on file  Social History Narrative   Works for [employer]    Married   m   Social Determinants of Health   Financial Resource Strain: Low Risk  (04/22/2022)   Overall Financial Resource Strain (CARDIA)    Difficulty of Paying Living Expenses: Not very hard  Food Insecurity: No Food Insecurity (04/22/2022)   Hunger Vital Sign    Worried About Running Out of Food in the Last Year: Never true    Ran Out  of Food in the Last Year: Never true  Transportation Needs: No Transportation Needs (04/22/2022)   PRAPARE - Administrator, Civil Service (Medical): No    Lack of Transportation (Non-Medical): No  Physical Activity: Unknown (04/22/2022)   Exercise Vital Sign    Days of Exercise per Week: 4 days    Minutes of Exercise per Session: Patient declined  Recent Concern: Physical Activity - Inactive (03/28/2022)   Exercise Vital Sign    Days of Exercise per Week: 0 days    Minutes of Exercise per Session: 0 min  Stress: No Stress Concern Present (04/22/2022)   [employer] of Occupational Health - Occupational Stress Questionnaire    Feeling of Stress : Not at all  Social Connections: Moderately Isolated (04/22/2022)   Social Connection and Isolation  Panel [NHANES]    Frequency of Communication with Friends and Family: More than three times a week    Frequency of Social Gatherings with Friends and Family: Once a week    Attends Religious Services: Never    Database administrator or Organizations: No    Attends Banker Meetings: Never    Marital Status: Married  Catering manager Violence: Not At Risk (03/28/2022)   Humiliation, Afraid, Rape, and Kick questionnaire    Fear of Current or Ex-Partner: No    Emotionally Abused: No    Physically Abused: No    Sexually Abused: No    Past Surgical History:  Procedure Laterality Date   HERNIA REPAIR  2007   x2   PAROTIDECTOMY Left 10/07/2022   Procedure: LEFT PAROTIDECTOMY;  Surgeon: Christia Reading, MD;  Location: [facility];  Service: ENT;  Laterality: Left;   SHOULDER SURGERY Right 2018   TONSILLECTOMY     age 58    Family History  Problem Relation Age of Onset   Hypertension Other    Colon cancer Neg Hx    Rectal cancer Neg Hx    Stomach cancer Neg Hx    Esophageal cancer Neg Hx     Allergies  Allergen Reactions   Glucophage [Metformin] Diarrhea    Current Outpatient Medications on File Prior to Visit  Medication Sig Dispense Refill   acetaminophen (TYLENOL) 500 MG tablet Take 1,000 mg by mouth every 6 (six) hours as needed for mild pain.     allopurinol (ZYLOPRIM) 300 MG tablet TAKE 1 TABLET BY MOUTH EVERY DAY 90 tablet 3   amphetamine-dextroamphetamine (ADDERALL) 10 MG tablet Take 1 tablet (10 mg total) by mouth daily with breakfast. 30 tablet 0   amphetamine-dextroamphetamine (ADDERALL) 10 MG tablet Take 1 tablet (10 mg total) by mouth daily with breakfast. 30 tablet 0   amphetamine-dextroamphetamine (ADDERALL) 10 MG tablet Take 1 tablet (10 mg total) by mouth daily with breakfast. 30 tablet 0   diltiazem (CARDIZEM SR) 120 MG 12 hr capsule TAKE 1 CAPSULE BY MOUTH EVERY DAY 90 capsule 1   finasteride (PROSCAR) 5 MG tablet TAKE 1 TABLET (5 MG  TOTAL) BY MOUTH DAILY. 90 tablet 3   HYDROcodone-acetaminophen (NORCO/VICODIN) 5-325 MG tablet Take 1-2 tablets by mouth every 6 (six) hours as needed for moderate pain. 12 tablet 0   levothyroxine (SYNTHROID) 150 MCG tablet Take 1 tablet (150 mcg total) by mouth daily. 90 tablet 3   lisinopril (ZESTRIL) 5 MG tablet TAKE 1 TABLET (5 MG TOTAL) BY MOUTH DAILY. 90 tablet 0   metoprolol succinate (TOPROL-XL) 50 MG 24 hr tablet TAKE 1 TABLET BY MOUTH  EVERY DAY 30 tablet 1   rivaroxaban (XARELTO) 20 MG TABS tablet Take 1 tablet (20 mg total) by mouth daily with supper. 30 tablet 5   traMADol (ULTRAM) 50 MG tablet TAKE 1 TABLET BY MOUTH EVERY 8 HOURS AS NEEDED. 60 tablet 2   traZODone (DESYREL) 50 MG tablet TAKE 1 TABLET BY MOUTH EVERY DAY AT BEDTIME AS NEEDED FOR SLEEP 90 tablet 1   triamterene-hydrochlorothiazide (MAXZIDE) 75-50 MG tablet TAKE 1 TABLET BY MOUTH EVERY DAY 90 tablet 1   No current facility-administered medications on file prior to visit.    BP 100/70   Pulse 77   Temp 98.2 F (36.8 C) (Oral)   Ht 5' 9.5" (1.765 m)   Wt 168 lb (76.2 kg)   SpO2 97%   BMI 24.45 kg/m       Objective:   Physical Exam Vitals and nursing note reviewed.  Constitutional:      Appearance: Normal appearance. He is normal weight.  Cardiovascular:     Rate and Rhythm: Normal rate and regular rhythm.     Pulses: Normal pulses.     Heart sounds: Normal heart sounds.  Pulmonary:     Effort: Pulmonary effort is normal.     Breath sounds: Normal breath sounds.  Skin:    General: Skin is warm and dry.  Neurological:     General: No focal deficit present.     Mental Status: He is alert and oriented to person, place, and time.  Psychiatric:        Mood and Affect: Mood normal.        Behavior: Behavior normal.        Thought Content: Thought content normal.        Judgment: Judgment normal.       Assessment & Plan:  1. Diabetes mellitus treated with oral medication (HCC)  - POC HgB A1c- 10.2   - Will check C-peptid and insulin levels today. Samples of Jardiance 10 mg given. We may put him on insulin  - Follow up in 3 months  - Insulin, Free (Bioactive); Future - C-peptide; Future - Microalbumin/Creatinine Ratio, Urine; Future  2. Essential hypertension - Controlled. No change in medication   3. Hypothyroidism, unspecified type  - TSH; Future  4. Paroxysmal atrial fibrillation (HCC)  - EKG 12-Lead- NSR, Rate 69  - Advised to follow up with Cardiology   Shirline Frees, NP  Time spent with patient today was 34 minutes which consisted of chart review, discussing DM, HTN, Hypothyroidism, and Afib.  work up, treatment answering questions and documentation. Time for EKG was not included in the 34 minutes

## 2022-10-11 LAB — C-PEPTIDE: C-Peptide: 3.49 ng/mL (ref 0.80–3.85)

## 2022-10-14 DIAGNOSIS — K118 Other diseases of salivary glands: Secondary | ICD-10-CM | POA: Diagnosis not present

## 2022-10-15 ENCOUNTER — Encounter: Payer: Self-pay | Admitting: Adult Health

## 2022-10-15 ENCOUNTER — Other Ambulatory Visit: Payer: Self-pay | Admitting: Adult Health

## 2022-10-15 DIAGNOSIS — E119 Type 2 diabetes mellitus without complications: Secondary | ICD-10-CM

## 2022-10-15 NOTE — Telephone Encounter (Signed)
Please advise 

## 2022-10-21 LAB — INSULIN, FREE (BIOACTIVE): Insulin, Free: 7.8 u[IU]/mL (ref 1.5–14.9)

## 2022-10-24 ENCOUNTER — Other Ambulatory Visit (INDEPENDENT_AMBULATORY_CARE_PROVIDER_SITE_OTHER): Payer: Medicare HMO

## 2022-10-24 DIAGNOSIS — E119 Type 2 diabetes mellitus without complications: Secondary | ICD-10-CM

## 2022-10-24 DIAGNOSIS — Z7984 Long term (current) use of oral hypoglycemic drugs: Secondary | ICD-10-CM

## 2022-10-24 LAB — BASIC METABOLIC PANEL
BUN: 30 mg/dL — ABNORMAL HIGH (ref 6–23)
CO2: 25 mEq/L (ref 19–32)
Calcium: 9.7 mg/dL (ref 8.4–10.5)
Chloride: 97 mEq/L (ref 96–112)
Creatinine, Ser: 1.21 mg/dL (ref 0.40–1.50)
GFR: 58.77 mL/min — ABNORMAL LOW (ref >60.00)
Glucose, Bld: 348 mg/dL — ABNORMAL HIGH (ref 70–99)
Potassium: 3.5 mEq/L (ref 3.5–5.1)
Sodium: 133 mEq/L — ABNORMAL LOW (ref 135–145)

## 2022-10-28 ENCOUNTER — Ambulatory Visit: Payer: Medicare HMO | Admitting: Adult Health

## 2022-10-29 ENCOUNTER — Telehealth: Payer: Self-pay | Admitting: Adult Health

## 2022-10-29 MED ORDER — INSULIN GLARGINE 100 UNIT/ML ~~LOC~~ SOLN: SUBCUTANEOUS | 2 refills | Status: DC

## 2022-10-29 NOTE — Telephone Encounter (Signed)
Pt called, returning CMA's call. CMA was with a patient Pt asked that CMA call back at her earliest convenience. 

## 2022-10-29 NOTE — Telephone Encounter (Signed)
Patient notified of update  and verbalized understanding. 

## 2022-10-30 ENCOUNTER — Other Ambulatory Visit: Payer: Self-pay | Admitting: Adult Health

## 2022-10-30 ENCOUNTER — Other Ambulatory Visit: Payer: Self-pay

## 2022-10-30 ENCOUNTER — Ambulatory Visit: Payer: Medicare HMO

## 2022-10-30 ENCOUNTER — Telehealth: Payer: Medicare HMO | Admitting: Adult Health

## 2022-10-30 MED ORDER — INSULIN GLARGINE 100 UNIT/ML SOLOSTAR PEN: 10.0000 [IU] | PEN_INJECTOR | Freq: Every day | SUBCUTANEOUS | 11 refills | Status: DC

## 2022-10-30 MED ORDER — PEN NEEDLES 31G X 5 MM MISC: 1.0000 | Freq: Three times a day (TID) | 0 refills | Status: DC

## 2022-10-31 ENCOUNTER — Ambulatory Visit: Payer: Medicare HMO

## 2022-11-03 ENCOUNTER — Encounter: Payer: Self-pay | Admitting: Adult Health

## 2022-11-04 MED ORDER — FREESTYLE LIBRE 3 SENSOR MISC: 1.0000 | 6 refills | Status: DC

## 2022-11-04 MED ORDER — EMPAGLIFLOZIN 10 MG PO TABS: 10.0000 mg | ORAL_TABLET | Freq: Every day | ORAL | 0 refills | Status: DC

## 2022-11-04 NOTE — Telephone Encounter (Signed)
Pt called to follow up on these requests.  Pt is asking for a call back, if there are any obstacles.

## 2022-11-04 NOTE — Telephone Encounter (Signed)
L/m for pt to return call

## 2022-11-05 NOTE — Telephone Encounter (Signed)
L/m for pt to return call

## 2022-11-05 NOTE — Telephone Encounter (Signed)
Pt notified of update.  

## 2022-11-05 NOTE — Telephone Encounter (Signed)
Pt is returning kendra call 

## 2022-11-12 ENCOUNTER — Ambulatory Visit (INDEPENDENT_AMBULATORY_CARE_PROVIDER_SITE_OTHER): Payer: Medicare HMO | Admitting: Adult Health

## 2022-11-12 ENCOUNTER — Encounter: Payer: Self-pay | Admitting: Adult Health

## 2022-11-12 VITALS — BP 110/80 | HR 56 | Temp 98.3°F | Ht 69.5 in | Wt 164.0 lb

## 2022-11-12 DIAGNOSIS — G629 Polyneuropathy, unspecified: Secondary | ICD-10-CM

## 2022-11-12 DIAGNOSIS — L309 Dermatitis, unspecified: Secondary | ICD-10-CM

## 2022-11-12 MED ORDER — TRIAMCINOLONE ACETONIDE 0.1 % EX LOTN
1.0000 | TOPICAL_LOTION | Freq: Two times a day (BID) | CUTANEOUS | 2 refills | Status: AC | PRN
Start: 2022-11-12 — End: ?

## 2022-11-12 NOTE — Progress Notes (Signed)
Subjective:    Patient ID: Jose Evans, male    DOB: [birth year], 77 y.o.   MRN: [id number]  HPI  77 year old male who  has a past medical history of Adult acne, Cancer (HCC) (07/2017), Diabetes mellitus without complication (HCC), Dyspnea, Gout, Hypertension, Hypothyroidism, and Thyroid disease.  He presents to the office today for concern of numbness and tingling in his right foot. He reports that he woke up one morning about three weeks ago and noticed that his right foot was numb. His right foot has stayed numb, does not get better or get worse. He feels as though it affects his gait when walking. He feels the numbness and tingling mostly while ambulating. He has had uncontrolled diabetes for the last six months or so with blood sugar reading in the 300-400's. He started on insulin therapy yesterday. He is wearing the libre CGM. Currently his blood sugar is 157. He also reports that he has changed his diet and is eating no carbs and low sugar.   He denies numbness or tingling in is left foot. No swelling, calf pain or lower extremity discoloration noted.   He would also like a refill of Triamcinolone that he uses for eczema on his hands periodically.    Review of Systems  See HPI  Past Medical History:  Diagnosis Date   Adult acne    Cancer (HCC) 07/2017   skin cancer   Diabetes mellitus without complication (HCC)    Dyspnea    Gout    Hypertension    Hypothyroidism    Thyroid disease     Social History   Socioeconomic History   Marital status: Married    Spouse name: Not on file   Number of children: Not on file   Years of education: Not on file   Highest education level: Associate degree: academic program  Occupational History   Not on file  Tobacco Use   Smoking status: Former    Current packs/day: 0.00    Average packs/day: 0.5 packs/day for 30.0 years (15.0 ttl pk-yrs)    Types: Cigarettes    Start date: 09/08/1975    Quit date: 09/07/2005    Years since  quitting: 17.1   Smokeless tobacco: Never  Vaping Use   Vaping status: Never Used  Substance and Sexual Activity   Alcohol use: Yes    Comment: occasional   Drug use: No   Sexual activity: Not on file  Other Topics Concern   Not on file  Social History Narrative   Works for [employer]    Married   m   Social Determinants of Health   Financial Resource Strain: Low Risk  (04/22/2022)   Overall Financial Resource Strain (CARDIA)    Difficulty of Paying Living Expenses: Not very hard  Food Insecurity: No Food Insecurity (04/22/2022)   Hunger Vital Sign    Worried About Running Out of Food in the Last Year: Never true    Ran Out of Food in the Last Year: Never true  Transportation Needs: No Transportation Needs (04/22/2022)   PRAPARE - Administrator, Civil Service (Medical): No    Lack of Transportation (Non-Medical): No  Physical Activity: Unknown (04/22/2022)   Exercise Vital Sign    Days of Exercise per Week: 4 days    Minutes of Exercise per Session: Patient declined  Recent Concern: Physical Activity - Inactive (03/28/2022)   Exercise Vital Sign    Days of Exercise per  Week: 0 days    Minutes of Exercise per Session: 0 min  Stress: No Stress Concern Present (04/22/2022)   [employer] of Occupational Health - Occupational Stress Questionnaire    Feeling of Stress : Not at all  Social Connections: Moderately Isolated (04/22/2022)   Social Connection and Isolation Panel [NHANES]    Frequency of Communication with Friends and Family: More than three times a week    Frequency of Social Gatherings with Friends and Family: Once a week    Attends Religious Services: Never    Database administrator or Organizations: No    Attends Banker Meetings: Never    Marital Status: Married  Catering manager Violence: Not At Risk (03/28/2022)   Humiliation, Afraid, Rape, and Kick questionnaire    Fear of Current or Ex-Partner: No    Emotionally Abused: No     Physically Abused: No    Sexually Abused: No    Past Surgical History:  Procedure Laterality Date   HERNIA REPAIR  2007   x2   PAROTIDECTOMY Left 10/07/2022   Procedure: LEFT PAROTIDECTOMY;  Surgeon: Christia Reading, MD;  Location: [facility];  Service: ENT;  Laterality: Left;   SHOULDER SURGERY Right 2018   TONSILLECTOMY     age 34    Family History  Problem Relation Age of Onset   Hypertension Other    Colon cancer Neg Hx    Rectal cancer Neg Hx    Stomach cancer Neg Hx    Esophageal cancer Neg Hx     Allergies  Allergen Reactions   Glipizide     Abdominal pain    Glucophage [Metformin] Diarrhea    Current Outpatient Medications on File Prior to Visit  Medication Sig Dispense Refill   acetaminophen (TYLENOL) 500 MG tablet Take 1,000 mg by mouth every 6 (six) hours as needed for mild pain.     allopurinol (ZYLOPRIM) 300 MG tablet TAKE 1 TABLET BY MOUTH EVERY DAY 90 tablet 3   Continuous Glucose Sensor (FREESTYLE LIBRE 3 SENSOR) MISC 1 Device by Does not apply route every 14 (fourteen) days. Place 1 sensor on the skin every 14 days. Use to check glucose continuously 6 each 6   diltiazem (CARDIZEM SR) 120 MG 12 hr capsule TAKE 1 CAPSULE BY MOUTH EVERY DAY 90 capsule 1   empagliflozin (JARDIANCE) 10 MG TABS tablet Take 1 tablet (10 mg total) by mouth daily before breakfast. 90 tablet 0   finasteride (PROSCAR) 5 MG tablet TAKE 1 TABLET (5 MG TOTAL) BY MOUTH DAILY. 90 tablet 3   insulin glargine (LANTUS) 100 UNIT/ML Solostar Pen Inject 10 Units into the skin daily. Inject 10 units under skin daily 15 mL 11   Insulin Pen Needle (PEN NEEDLES) 31G X 5 MM MISC 1 each by Does not apply route 3 (three) times daily with meals. May substitute to any manufacturer covered by patient's insurance. 100 each 0   levothyroxine (SYNTHROID) 150 MCG tablet Take 1 tablet (150 mcg total) by mouth daily. 90 tablet 3   lisinopril (ZESTRIL) 5 MG tablet TAKE 1 TABLET (5 MG TOTAL) BY MOUTH  DAILY. 90 tablet 0   metoprolol succinate (TOPROL-XL) 50 MG 24 hr tablet TAKE 1 TABLET BY MOUTH EVERY DAY 30 tablet 1   rivaroxaban (XARELTO) 20 MG TABS tablet Take 1 tablet (20 mg total) by mouth daily with supper. (Patient not taking: Reported on 10/10/2022) 30 tablet 5   traMADol (ULTRAM) 50 MG tablet TAKE  1 TABLET BY MOUTH EVERY 8 HOURS AS NEEDED. 60 tablet 2   traZODone (DESYREL) 50 MG tablet TAKE 1 TABLET BY MOUTH EVERY DAY AT BEDTIME AS NEEDED FOR SLEEP 90 tablet 1   triamterene-hydrochlorothiazide (MAXZIDE) 75-50 MG tablet TAKE 1 TABLET BY MOUTH EVERY DAY 90 tablet 1   No current facility-administered medications on file prior to visit.    BP 110/80   Pulse (!) 56   Temp 98.3 F (36.8 C) (Oral)   Ht 5' 9.5" (1.765 m)   Wt 164 lb (74.4 kg)   SpO2 97%   BMI 23.87 kg/m       Objective:   Physical Exam Vitals and nursing note reviewed.  Constitutional:      Appearance: Normal appearance.  Musculoskeletal:        General: No swelling, tenderness or deformity. Normal range of motion.     Right lower leg: No edema.     Left lower leg: No edema.  Skin:    General: Skin is warm and dry.     Capillary Refill: Capillary refill takes less than 2 seconds.     Findings: No bruising or erythema.  Neurological:     General: No focal deficit present.     Mental Status: He is alert and oriented to person, place, and time.  Psychiatric:        Mood and Affect: Mood normal.        Behavior: Behavior normal.        Thought Content: Thought content normal.        Judgment: Judgment normal.       Assessment & Plan:  1. Neuropathy - No signs of DVT. We discussed possibly causes including diabetic neuropathy and nerve compression in lumbar spine. Likely this is more neuropathy from diabetes. I advised him that this is not curable but can be managed with lowering his blood sugar. We discussed imaging on his back but he would like to see how the numbness and tingling responds to lowering  his blood sugar.  - He has a follow up appointment in 2 months but knows he can follow up before then   2. Eczema, unspecified type  - triamcinolone lotion (KENALOG) 0.1 %; Apply 1 Application topically 2 (two) times daily as needed.  Dispense: 60 mL; Refill: 2  Shirline Frees, NP  Time spent with patient today was 31 minutes which consisted of chart review, discussing neuropathy and diabetes work up, treatment answering questions and documentation.

## 2022-11-18 ENCOUNTER — Ambulatory Visit: Payer: Medicare HMO | Attending: Cardiology | Admitting: Cardiology

## 2022-11-18 ENCOUNTER — Encounter: Payer: Self-pay | Admitting: Cardiology

## 2022-11-18 VITALS — BP 120/72 | HR 56 | Ht 69.5 in | Wt 163.6 lb

## 2022-11-18 DIAGNOSIS — I10 Essential (primary) hypertension: Secondary | ICD-10-CM

## 2022-11-18 DIAGNOSIS — D6869 Other thrombophilia: Secondary | ICD-10-CM

## 2022-11-18 DIAGNOSIS — I480 Paroxysmal atrial fibrillation: Secondary | ICD-10-CM | POA: Diagnosis not present

## 2022-11-18 NOTE — Progress Notes (Signed)
  Electrophysiology Office Note:   Date:  11/18/2022  ID:  Jose Evans, DOB 04/24/1945, MRN [id number]  Primary Cardiologist: None Electrophysiologist: Arizbeth Cawthorn Jorja Loa, MD      History of Present Illness:   Jose Evans is a 77 y.o. male with h/o real fibrillation seen today for routine electrophysiology followup.  Since last being seen in our clinic the patient reports doing since being seen he has done well.  He is noted no further episodes of atrial fibrillation since being started on diltiazem.  He states that his thyroid has gotten well-controlled, and he has stopped taking his Adderall.  He thinks that this contributes to his low [name] burden.  he denies chest pain, palpitations, dyspnea, PND, orthopnea, nausea, vomiting, dizziness, syncope, edema, weight gain, or early satiety.   Review of systems complete and found to be negative unless listed in HPI.   EP Information / Studies Reviewed:    EKG is not ordered today. EKG from 10/10/22 reviewed which showed sinus rhythm        Risk Assessment/Calculations:    CHA2DS2-VASc Score = 3   This indicates a 3.2% annual risk of stroke. The patient's score is based upon: CHF History: 0 HTN History: 1 Diabetes History: 1 Stroke History: 0 Vascular Disease History: 0 Age Score: 1 Gender Score: 0             Physical Exam:   VS:  BP 120/72 (BP Location: Left Arm, Patient Position: Sitting, Cuff Size: Normal)   Pulse (!) 56   Ht 5' 9.5" (1.765 m)   Wt 163 lb 9.6 oz (74.2 kg)   SpO2 99%   BMI 23.81 kg/m    Wt Readings from Last 3 Encounters:  11/18/22 163 lb 9.6 oz (74.2 kg)  11/12/22 164 lb (74.4 kg)  10/10/22 168 lb (76.2 kg)     GEN: Well nourished, well developed in no acute distress NECK: No JVD; No carotid bruits CARDIAC: Regular rate and rhythm, no murmurs, rubs, gallops RESPIRATORY:  Clear to auscultation without rales, wheezing or rhonchi  ABDOMEN: Soft, non-tender, non-distended EXTREMITIES:  No edema;  No deformity   ASSESSMENT AND PLAN:    1.  Paroxysmal atrial fibrillation/flutter: CHA2DS2-VASc of 3.  Currently on diltiazem and Xarelto.  He feels much improved.  Caidance Sybert continue with current management.  2.  Secondary hypercoagulable state: Currently on Xarelto for atrial fibrillation  3.  Hypertension: Currently well-controlled.  Ellenora Talton stop triamterene/HCTZ today as he has not taken it in a few days.  Blood pressure has been well-controlled off this medication.  Follow up with Afib Clinic in 12 months  Signed, Kawon Willcutt Jorja Loa, MD

## 2022-12-04 ENCOUNTER — Encounter (INDEPENDENT_AMBULATORY_CARE_PROVIDER_SITE_OTHER): Payer: Self-pay

## 2022-12-06 ENCOUNTER — Other Ambulatory Visit (HOSPITAL_COMMUNITY): Payer: Self-pay | Admitting: Cardiology

## 2023-01-01 ENCOUNTER — Encounter: Payer: Self-pay | Admitting: Adult Health

## 2023-01-01 ENCOUNTER — Other Ambulatory Visit: Payer: Self-pay | Admitting: Adult Health

## 2023-01-01 MED ORDER — TRAMADOL HCL 50 MG PO TABS: 50.0000 mg | ORAL_TABLET | Freq: Three times a day (TID) | ORAL | 2 refills | Status: DC | PRN

## 2023-01-01 NOTE — Telephone Encounter (Signed)
Please advise 

## 2023-01-03 ENCOUNTER — Other Ambulatory Visit: Payer: Self-pay | Admitting: Adult Health

## 2023-01-03 DIAGNOSIS — I10 Essential (primary) hypertension: Secondary | ICD-10-CM

## 2023-01-13 ENCOUNTER — Ambulatory Visit: Payer: Medicare HMO | Admitting: Adult Health

## 2023-01-13 LAB — HM DIABETES EYE EXAM

## 2023-01-13 NOTE — Progress Notes (Deleted)
Subjective:    Patient ID: Jose Evans, male    DOB: 1946/01/25, 76 y.o.   MRN: [id number]  HPI 77 year old male who.  has a past medical history of Adult acne, Cancer (HCC) (07/2017), Diabetes mellitus without complication (HCC), Dyspnea, Gout, Hypertension, Hypothyroidism, and Thyroid disease.  He presents to the office today for follow-up regarding diabetes/hypertension/hypothyroidism.  DM Type 2 -in the past we did try him on metformin but he stopped this medication due to GI issues.  We then switched him to glipizide 2.5 mg extended release in January 2024 but never followed up until June 2024. In June he had a left sided parotidectomy and it was noted that his blood sugars were in the high 200's-300's on spot check. He reports that when he was taking Glipizide it was causing abdominal pain so he stopped the medication and never let us know.. In June he was placed on Jardiance 10 mg daily and Lantus 10 units daily.  Lab Results  Component Value Date   HGBA1C 10.2 (A) 10/10/2022   HTN -currently managed with lisinopril 5 mg daily, Maxide 75-50 mg daily, and metoprolol succinate 50 mg daily.  He denies dizziness, lightheadedness, chest pain, or shortness of breath BP Readings from Last 3 Encounters:  11/18/22 120/72  11/12/22 110/80  10/10/22 100/70    Hypothyroidism -is currently prescribed Synthroid 150 mg daily.He feels controlled.  Lab Results  Component Value Date   TSH 0.93 10/10/2022    Review of Systems See HPI   Past Medical History:  Diagnosis Date   Adult acne    Cancer (HCC) 07/2017   skin cancer   Diabetes mellitus without complication (HCC)    Dyspnea    Gout    Hypertension    Hypothyroidism    Thyroid disease     Social History   Socioeconomic History   Marital status: Married    Spouse name: Not on file   Number of children: Not on file   Years of education: Not on file   Highest education level: Associate degree: academic program   Occupational History   Not on file  Tobacco Use   Smoking status: Former    Current packs/day: 0.00    Average packs/day: 0.5 packs/day for 30.0 years (15.0 ttl pk-yrs)    Types: Cigarettes    Start date: 09/08/1975    Quit date: 09/07/2005    Years since quitting: 17.3   Smokeless tobacco: Never  Vaping Use   Vaping status: Never Used  Substance and Sexual Activity   Alcohol use: Yes    Comment: occasional   Drug use: No   Sexual activity: Not on file  Other Topics Concern   Not on file  Social History Narrative   Works for [employer]    Married   m   Social Determinants of Health   Financial Resource Strain: Low Risk  (04/22/2022)   Overall Financial Resource Strain (CARDIA)    Difficulty of Paying Living Expenses: Not very hard  Food Insecurity: No Food Insecurity (04/22/2022)   Hunger Vital Sign    Worried About Running Out of Food in the Last Year: Never true    Ran Out of Food in the Last Year: Never true  Transportation Needs: No Transportation Needs (04/22/2022)   PRAPARE - Administrator, Civil Service (Medical): No    Lack of Transportation (Non-Medical): No  Physical Activity: Unknown (04/22/2022)   Exercise Vital Sign  Days of Exercise per Week: 4 days    Minutes of Exercise per Session: Patient declined  Recent Concern: Physical Activity - Inactive (03/28/2022)   Exercise Vital Sign    Days of Exercise per Week: 0 days    Minutes of Exercise per Session: 0 min  Stress: No Stress Concern Present (04/22/2022)   [employer] of Occupational Health - Occupational Stress Questionnaire    Feeling of Stress : Not at all  Social Connections: Moderately Isolated (04/22/2022)   Social Connection and Isolation Panel [NHANES]    Frequency of Communication with Friends and Family: More than three times a week    Frequency of Social Gatherings with Friends and Family: Once a week    Attends Religious Services: Never    Database administrator or  Organizations: No    Attends Banker Meetings: Never    Marital Status: Married  Catering manager Violence: Not At Risk (03/28/2022)   Humiliation, Afraid, Rape, and Kick questionnaire    Fear of Current or Ex-Partner: No    Emotionally Abused: No    Physically Abused: No    Sexually Abused: No    Past Surgical History:  Procedure Laterality Date   HERNIA REPAIR  2007   x2   PAROTIDECTOMY Left 10/07/2022   Procedure: LEFT PAROTIDECTOMY;  Surgeon: Christia Reading, MD;  Location: [facility];  Service: ENT;  Laterality: Left;   SHOULDER SURGERY Right 2018   TONSILLECTOMY     age 84    Family History  Problem Relation Age of Onset   Hypertension Other    Colon cancer Neg Hx    Rectal cancer Neg Hx    Stomach cancer Neg Hx    Esophageal cancer Neg Hx     Allergies  Allergen Reactions   Glipizide     Abdominal pain    Glucophage [Metformin] Diarrhea    Current Outpatient Medications on File Prior to Visit  Medication Sig Dispense Refill   acetaminophen (TYLENOL) 500 MG tablet Take 1,000 mg by mouth every 6 (six) hours as needed for mild pain.     allopurinol (ZYLOPRIM) 300 MG tablet TAKE 1 TABLET BY MOUTH EVERY DAY 90 tablet 3   Continuous Glucose Sensor (FREESTYLE LIBRE 3 SENSOR) MISC 1 Device by Does not apply route every 14 (fourteen) days. Place 1 sensor on the skin every 14 days. Use to check glucose continuously 6 each 6   diltiazem (CARDIZEM SR) 120 MG 12 hr capsule TAKE 1 CAPSULE BY MOUTH EVERY DAY 90 capsule 1   empagliflozin (JARDIANCE) 10 MG TABS tablet Take 1 tablet (10 mg total) by mouth daily before breakfast. 90 tablet 0   finasteride (PROSCAR) 5 MG tablet TAKE 1 TABLET (5 MG TOTAL) BY MOUTH DAILY. 90 tablet 3   insulin glargine (LANTUS) 100 UNIT/ML Solostar Pen Inject 10 Units into the skin daily. Inject 10 units under skin daily 15 mL 11   Insulin Pen Needle (PEN NEEDLES) 31G X 5 MM MISC 1 each by Does not apply route 3 (three) times  daily with meals. May substitute to any manufacturer covered by patient's insurance. 100 each 0   levothyroxine (SYNTHROID) 150 MCG tablet Take 1 tablet (150 mcg total) by mouth daily. 90 tablet 3   lisinopril (ZESTRIL) 5 MG tablet TAKE 1 TABLET (5 MG TOTAL) BY MOUTH DAILY. 90 tablet 0   metoprolol succinate (TOPROL-XL) 50 MG 24 hr tablet TAKE 1 TABLET BY MOUTH EVERY DAY 90 tablet  3   rivaroxaban (XARELTO) 20 MG TABS tablet Take 1 tablet (20 mg total) by mouth daily with supper. (Patient not taking: Reported on 11/18/2022) 30 tablet 5   traMADol (ULTRAM) 50 MG tablet Take 1 tablet (50 mg total) by mouth every 8 (eight) hours as needed. 60 tablet 2   traZODone (DESYREL) 50 MG tablet TAKE 1 TABLET BY MOUTH EVERY DAY AT BEDTIME AS NEEDED FOR SLEEP (Patient not taking: Reported on 11/18/2022) 90 tablet 1   triamcinolone lotion (KENALOG) 0.1 % Apply 1 Application topically 2 (two) times daily as needed. 60 mL 2   triamterene-hydrochlorothiazide (MAXZIDE) 75-50 MG tablet TAKE 1 TABLET BY MOUTH EVERY DAY (Patient not taking: Reported on 11/18/2022) 90 tablet 1   No current facility-administered medications on file prior to visit.    There were no vitals taken for this visit.      Objective:   Physical Exam Vitals and nursing note reviewed.  Constitutional:      Appearance: Normal appearance. He is obese.  Cardiovascular:     Rate and Rhythm: Normal rate and regular rhythm.     Pulses: Normal pulses.     Heart sounds: Normal heart sounds.  Pulmonary:     Effort: Pulmonary effort is normal.     Breath sounds: Normal breath sounds.  Skin:    General: Skin is warm and dry.  Neurological:     General: No focal deficit present.     Mental Status: He is alert and oriented to person, place, and time.  Psychiatric:        Mood and Affect: Mood normal.        Behavior: Behavior normal.        Thought Content: Thought content normal.        Judgment: Judgment normal.           Assessment &  Plan:

## 2023-01-16 ENCOUNTER — Ambulatory Visit (INDEPENDENT_AMBULATORY_CARE_PROVIDER_SITE_OTHER): Payer: Medicare HMO | Admitting: Adult Health

## 2023-01-16 ENCOUNTER — Encounter: Payer: Self-pay | Admitting: Adult Health

## 2023-01-16 VITALS — BP 120/70 | HR 52 | Temp 98.2°F | Ht 69.5 in | Wt 156.0 lb

## 2023-01-16 DIAGNOSIS — Z23 Encounter for immunization: Secondary | ICD-10-CM | POA: Diagnosis not present

## 2023-01-16 DIAGNOSIS — I10 Essential (primary) hypertension: Secondary | ICD-10-CM

## 2023-01-16 DIAGNOSIS — Z7984 Long term (current) use of oral hypoglycemic drugs: Secondary | ICD-10-CM | POA: Diagnosis not present

## 2023-01-16 DIAGNOSIS — E119 Type 2 diabetes mellitus without complications: Secondary | ICD-10-CM

## 2023-01-16 DIAGNOSIS — R2681 Unsteadiness on feet: Secondary | ICD-10-CM

## 2023-01-16 DIAGNOSIS — Z794 Long term (current) use of insulin: Secondary | ICD-10-CM

## 2023-01-16 LAB — POCT GLYCOSYLATED HEMOGLOBIN (HGB A1C): Hemoglobin A1C: 6.8 % — AB (ref 4.0–5.6)

## 2023-01-16 MED ORDER — PEN NEEDLES 31G X 5 MM MISC
1.0000 | Freq: Three times a day (TID) | 3 refills | Status: DC
Start: 2023-01-16 — End: 2023-08-25

## 2023-01-16 MED ORDER — PIOGLITAZONE HCL 30 MG PO TABS
30.0000 mg | ORAL_TABLET | Freq: Every day | ORAL | 0 refills | Status: DC
Start: 2023-01-16 — End: 2023-03-11

## 2023-01-16 NOTE — Patient Instructions (Addendum)
I am going to have you stop Jardiance, I am going start you on Actos with the insulin   I have referred you to Diabetic Nutrition and PT   Your A1c improved to 6.8! This is much improved

## 2023-01-16 NOTE — Progress Notes (Signed)
Subjective:    Patient ID: Jose Evans, male    DOB: 1945-06-18, 77 y.o.   MRN: [id number]  HPI He presents to the office today for follow-up regarding diabetes/hypertension/hypothyroidism.  DM Type 2 -in the past we did try him on metformin but he stopped this medication due to GI issues.  We then switched him to glipizide 2.5 mg extended release in January 2024 but never followed up until June 2024. In June he had a left sided parotidectomy and it was noted that his blood sugars were in the high 200's-300's on spot check. He reports that when he was taking Glipizide it was causing abdominal pain so he stopped the medication and never let us know.. In June he was placed on Jardiance 10 mg daily and Lantus 10 units daily. Since that time he reports that since staring Jardiance he has had GI issue that is felt as abdominal cramping. [name] He has continued with taking the medication but would rather increase his insulin then have to take Jardiance. He is using [place] system and his 90 day ave is 159.  He is eating healthier and stays active   CONTINUOUS GLUCOSE MONITORING RECORD INTERPRETATION                 Dates of Recording:    Sensor description: freestyle libre    Results statistics:   CGM use % of time   Average and SD 159  Time in range 67  % Time Above 180 30  % Time above 250 3  % Time Below target 0     Lab Results  Component Value Date   HGBA1C 10.2 (A) 10/10/2022   HTN -currently managed with lisinopril 5 mg daily, Maxide 75-50 mg daily, and metoprolol succinate 50 mg daily.  He denies dizziness, lightheadedness, chest pain, or shortness of breath BP Readings from Last 3 Encounters:  01/16/23 120/70  11/18/22 120/72  11/12/22 110/80   Gait Instability -  Reports that he has felt unsteady on his feet and has fallen two times since I have last seen him. He has not hit his head. He has known neuropathy in right foot and does drag his feet which is the cause of his falls.   Review of Systems See HPI   Past Medical History:  Diagnosis Date   Adult acne    Cancer (HCC) 07/2017   skin cancer   Diabetes mellitus without complication (HCC)    Dyspnea    Gout    Hypertension    Hypothyroidism    Thyroid disease     Social History   Socioeconomic History   Marital status: Married    Spouse name: Not on file   Number of children: Not on file   Years of education: Not on file   Highest education level: Associate degree: academic program  Occupational History   Not on file  Tobacco Use   Smoking status: Former    Current packs/day: 0.00    Average packs/day: 0.5 packs/day for 30.0 years (15.0 ttl pk-yrs)    Types: Cigarettes    Start date: 09/08/1975    Quit date: 09/07/2005    Years since quitting: 17.3   Smokeless tobacco: Never  Vaping Use   Vaping status: Never Used  Substance and Sexual Activity   Alcohol use: Yes    Comment: occasional   Drug use: No   Sexual activity: Not on file  Other Topics Concern   Not on file  Social  History Narrative   Works for [employer]    Married   m   Social Determinants of Health   Financial Resource Strain: Low Risk  (04/22/2022)   Overall Financial Resource Strain (CARDIA)    Difficulty of Paying Living Expenses: Not very hard  Food Insecurity: No Food Insecurity (04/22/2022)   Hunger Vital Sign    Worried About Running Out of Food in the Last Year: Never true    Ran Out of Food in the Last Year: Never true  Transportation Needs: No Transportation Needs (04/22/2022)   PRAPARE - Administrator, Civil Service (Medical): No    Lack of Transportation (Non-Medical): No  Physical Activity: Unknown (04/22/2022)   Exercise Vital Sign    Days of Exercise per Week: 4 days    Minutes of Exercise per Session: Patient declined  Recent Concern: Physical Activity - Inactive (03/28/2022)   Exercise Vital Sign    Days of Exercise per Week: 0 days    Minutes of Exercise per Session: 0 min   Stress: No Stress Concern Present (04/22/2022)   [employer] of Occupational Health - Occupational Stress Questionnaire    Feeling of Stress : Not at all  Social Connections: Moderately Isolated (04/22/2022)   Social Connection and Isolation Panel [NHANES]    Frequency of Communication with Friends and Family: More than three times a week    Frequency of Social Gatherings with Friends and Family: Once a week    Attends Religious Services: Never    Database administrator or Organizations: No    Attends Banker Meetings: Never    Marital Status: Married  Catering manager Violence: Not At Risk (03/28/2022)   Humiliation, Afraid, Rape, and Kick questionnaire    Fear of Current or Ex-Partner: No    Emotionally Abused: No    Physically Abused: No    Sexually Abused: No    Past Surgical History:  Procedure Laterality Date   HERNIA REPAIR  2007   x2   PAROTIDECTOMY Left 10/07/2022   Procedure: LEFT PAROTIDECTOMY;  Surgeon: Christia Reading, MD;  Location: [facility];  Service: ENT;  Laterality: Left;   SHOULDER SURGERY Right 2018   TONSILLECTOMY     age 16    Family History  Problem Relation Age of Onset   Hypertension Other    Colon cancer Neg Hx    Rectal cancer Neg Hx    Stomach cancer Neg Hx    Esophageal cancer Neg Hx     Allergies  Allergen Reactions   Glipizide     Abdominal pain    Glucophage [Metformin] Diarrhea    Current Outpatient Medications on File Prior to Visit  Medication Sig Dispense Refill   acetaminophen (TYLENOL) 500 MG tablet Take 1,000 mg by mouth every 6 (six) hours as needed for mild pain.     allopurinol (ZYLOPRIM) 300 MG tablet TAKE 1 TABLET BY MOUTH EVERY DAY 90 tablet 3   Continuous Glucose Sensor (FREESTYLE LIBRE 3 SENSOR) MISC 1 Device by Does not apply route every 14 (fourteen) days. Place 1 sensor on the skin every 14 days. Use to check glucose continuously 6 each 6   diltiazem (CARDIZEM SR) 120 MG 12 hr capsule  TAKE 1 CAPSULE BY MOUTH EVERY DAY 90 capsule 1   empagliflozin (JARDIANCE) 10 MG TABS tablet Take 1 tablet (10 mg total) by mouth daily before breakfast. 90 tablet 0   finasteride (PROSCAR) 5 MG tablet TAKE  1 TABLET (5 MG TOTAL) BY MOUTH DAILY. 90 tablet 3   insulin glargine (LANTUS) 100 UNIT/ML Solostar Pen Inject 10 Units into the skin daily. Inject 10 units under skin daily 15 mL 11   Insulin Pen Needle (PEN NEEDLES) 31G X 5 MM MISC 1 each by Does not apply route 3 (three) times daily with meals. May substitute to any manufacturer covered by patient's insurance. 100 each 0   levothyroxine (SYNTHROID) 150 MCG tablet Take 1 tablet (150 mcg total) by mouth daily. 90 tablet 3   lisinopril (ZESTRIL) 5 MG tablet TAKE 1 TABLET (5 MG TOTAL) BY MOUTH DAILY. 90 tablet 0   metoprolol succinate (TOPROL-XL) 50 MG 24 hr tablet TAKE 1 TABLET BY MOUTH EVERY DAY 90 tablet 3   rivaroxaban (XARELTO) 20 MG TABS tablet Take 1 tablet (20 mg total) by mouth daily with supper. 30 tablet 5   traMADol (ULTRAM) 50 MG tablet Take 1 tablet (50 mg total) by mouth every 8 (eight) hours as needed. 60 tablet 2   traZODone (DESYREL) 50 MG tablet TAKE 1 TABLET BY MOUTH EVERY DAY AT BEDTIME AS NEEDED FOR SLEEP 90 tablet 1   triamcinolone lotion (KENALOG) 0.1 % Apply 1 Application topically 2 (two) times daily as needed. 60 mL 2   triamterene-hydrochlorothiazide (MAXZIDE) 75-50 MG tablet TAKE 1 TABLET BY MOUTH EVERY DAY 90 tablet 1   No current facility-administered medications on file prior to visit.    BP 120/70   Pulse (!) 52   Temp 98.2 F (36.8 C) (Oral)   Ht 5' 9.5" (1.765 m)   Wt 156 lb (70.8 kg)   SpO2 98%   BMI 22.71 kg/m       Objective:   Physical Exam Vitals and nursing note reviewed.  Constitutional:      Appearance: Normal appearance.  Cardiovascular:     Rate and Rhythm: Normal rate and regular rhythm.     Pulses: Normal pulses.     Heart sounds: Normal heart sounds.  Pulmonary:     Effort:  Pulmonary effort is normal.     Breath sounds: Normal breath sounds.  Musculoskeletal:        General: Normal range of motion.  Skin:    General: Skin is warm and dry.  Neurological:     General: No focal deficit present.     Mental Status: He is alert and oriented to person, place, and time.  Psychiatric:        Mood and Affect: Mood normal.        Behavior: Behavior normal.        Thought Content: Thought content normal.        Judgment: Judgment normal.       Assessment & Plan:  1. Need for influenza vaccination  - Flu Vaccine Trivalent High Dose (Fluad)  2. Diabetes mellitus treated with oral medication (HCC)  - POC HgB A1c- 6.8 - has improved - Will d/c Jardiance due to GI issues and place on Actos - Continue with  10 units of lantus  - Follow up in 3 months  - pioglitazone (ACTOS) 30 MG tablet; Take 1 tablet (30 mg total) by mouth daily.  Dispense: 90 tablet; Refill: 0 - Amb Referral to Nutrition and Diabetic Education - Insulin Pen Needle (PEN NEEDLES) 31G X 5 MM MISC; 1 each by Does not apply route 3 (three) times daily with meals. May substitute to any manufacturer covered by patient's insurance.  Dispense: 100 each; Refill:  3  3. Current use of insulin (HCC)  - POC HgB A1c - pioglitazone (ACTOS) 30 MG tablet; Take 1 tablet (30 mg total) by mouth daily.  Dispense: 90 tablet; Refill: 0 - Amb Referral to Nutrition and Diabetic Education - Insulin Pen Needle (PEN NEEDLES) 31G X 5 MM MISC; 1 each by Does not apply route 3 (three) times daily with meals. May substitute to any manufacturer covered by patient's insurance.  Dispense: 100 each; Refill: 3  4. Essential hypertension - Well controlled. No change in medication   5. Gait instability  - Ambulatory referral to Physical Therapy  Shirline Frees, NP

## 2023-01-20 DIAGNOSIS — K859 Acute pancreatitis without necrosis or infection, unspecified: Secondary | ICD-10-CM

## 2023-01-20 HISTORY — DX: Acute pancreatitis without necrosis or infection, unspecified: K85.90

## 2023-01-22 ENCOUNTER — Encounter: Payer: Self-pay | Admitting: Nutrition

## 2023-01-22 ENCOUNTER — Encounter: Payer: Medicare HMO | Attending: Adult Health | Admitting: Nutrition

## 2023-01-22 DIAGNOSIS — E119 Type 2 diabetes mellitus without complications: Secondary | ICD-10-CM | POA: Diagnosis not present

## 2023-01-22 DIAGNOSIS — Z794 Long term (current) use of insulin: Secondary | ICD-10-CM | POA: Insufficient documentation

## 2023-01-22 DIAGNOSIS — I10 Essential (primary) hypertension: Secondary | ICD-10-CM

## 2023-01-22 DIAGNOSIS — E118 Type 2 diabetes mellitus with unspecified complications: Secondary | ICD-10-CM

## 2023-01-22 NOTE — Progress Notes (Signed)
Medical Nutrition Therapy  Appointment Start time:  0800  Appointment End time:  0930  Primary concerns today: DM Type 2  Referral diagnosis: E11.8 Preferred learning style: no preference) Learning readiness: Ready    NUTRITION ASSESSMENT  77 yr old wmale referred for recent diagnosis of Type 2 DM. PCP Shirline Frees, NP. A1C 6.8%. Currently taking 10 units of Lantus daily. His A1C orginally had been 10.2% when diagnosed June 21st 2024.He didn't know he was DM prior to that.  Had ear surgery to remove a parotid cyst and found out he was DM with BS 348 mg/dl.  He is currently taking Actos 30 mg a day, 10 units of Lantus. He didn't tolerate Jardiance, Metformin or Glipizide due to GI issues. He has tolerated Actos without problems. He has a Therapist, art now. TIR 63%, TAR 37%, No hypoglycemia. Estimated A1C is 7.4% right now.  He notes he has been restricting carbohydrates, thinking that would make his BS better. Has been eating/grazing through out the day instead of 3 balanced meals.  Has had 25 lbs weight loss since January 2024.  He had a fall a year ago and hasn't felt good since with multiple medical issues going on. C PEPTIDE NORMAL, Insulin level WNL. Not Type 1 He has had some neuropathy in his right leg causing his leg to give away and has caused him to fall a few times in the last few weeks/months.  Appetite has been good but also realizes now that he has been avoiding carbs too much and causing BS to drop or go too high from not eating. He prefers to 'graze' vs eat 3 balanced meals and realizes this needs to change also.  He is willing to work in [employer] to improve his health and improve his DM.  Clinical Medical Hx: .medicahx  Medications:  Current Outpatient Medications on File Prior to Visit  Medication Sig Dispense Refill   diltiazem (CARDIZEM SR) 120 MG 12 hr capsule TAKE 1 CAPSULE BY MOUTH EVERY DAY 90 capsule 1   finasteride (PROSCAR) 5 MG tablet TAKE 1 TABLET  (5 MG TOTAL) BY MOUTH DAILY. 90 tablet 3   insulin glargine (LANTUS) 100 UNIT/ML Solostar Pen Inject 10 Units into the skin daily. Inject 10 units under skin daily 15 mL 11   levothyroxine (SYNTHROID) 150 MCG tablet Take 1 tablet (150 mcg total) by mouth daily. 90 tablet 3   lisinopril (ZESTRIL) 5 MG tablet TAKE 1 TABLET (5 MG TOTAL) BY MOUTH DAILY. 90 tablet 0   metoprolol succinate (TOPROL-XL) 50 MG 24 hr tablet TAKE 1 TABLET BY MOUTH EVERY DAY 90 tablet 3   pioglitazone (ACTOS) 30 MG tablet Take 1 tablet (30 mg total) by mouth daily. 90 tablet 0   triamcinolone lotion (KENALOG) 0.1 % Apply 1 Application topically 2 (two) times daily as needed. 60 mL 2   triamterene-hydrochlorothiazide (MAXZIDE) 75-50 MG tablet TAKE 1 TABLET BY MOUTH EVERY DAY 90 tablet 1   acetaminophen (TYLENOL) 500 MG tablet Take 1,000 mg by mouth every 6 (six) hours as needed for mild pain.     allopurinol (ZYLOPRIM) 300 MG tablet TAKE 1 TABLET BY MOUTH EVERY DAY 90 tablet 3   Continuous Glucose Sensor (FREESTYLE LIBRE 3 SENSOR) MISC 1 Device by Does not apply route every 14 (fourteen) days. Place 1 sensor on the skin every 14 days. Use to check glucose continuously 6 each 6   Insulin Pen Needle (PEN NEEDLES) 31G X 5 MM MISC 1 each by  Does not apply route 3 (three) times daily with meals. May substitute to any manufacturer covered by patient's insurance. 100 each 3   rivaroxaban (XARELTO) 20 MG TABS tablet Take 1 tablet (20 mg total) by mouth daily with supper. (Patient not taking: Reported on 01/22/2023) 30 tablet 5   traMADol (ULTRAM) 50 MG tablet Take 1 tablet (50 mg total) by mouth every 8 (eight) hours as needed. 60 tablet 2   traZODone (DESYREL) 50 MG tablet TAKE 1 TABLET BY MOUTH EVERY DAY AT BEDTIME AS NEEDED FOR SLEEP (Patient not taking: Reported on 01/22/2023) 90 tablet 1   No current facility-administered medications on file prior to visit.     Labs:  Lab Results  Component Value Date   HGBA1C 6.8 (A)  01/16/2023      Latest Ref Rng & Units 10/24/2022    9:28 AM 10/01/2022    1:04 PM 05/19/2022    8:36 AM  CMP  Glucose 70 - 99 mg/dL 865  784    BUN 6 - 23 mg/dL 30  32    Creatinine 6.96 - 1.50 mg/dL 2.95  2.84  1.32   Sodium 135 - 145 mEq/L 133  131    Potassium 3.5 - 5.1 mEq/L 3.5  4.7    Chloride 96 - 112 mEq/L 97  95    CO2 19 - 32 mEq/L 25  24    Calcium 8.4 - 10.5 mg/dL 9.7  9.4     Lipid Panel     Component Value Date/Time   CHOL 155 02/28/2021 0833   TRIG 138.0 02/28/2021 0833   HDL 39.20 02/28/2021 0833   CHOLHDL 4 02/28/2021 0833   VLDL 27.6 02/28/2021 0833   LDLCALC 88 02/28/2021 0833   LDLCALC 122 (H) 01/26/2020 1338    Notable Signs/Symptoms: Weakness at times.  Lifestyle & Dietary Hx Lives with wife. Eats 3 meals per day He cooks mostly. Only eats out 1 per week.  Estimated daily fluid intake: 60 oz Supplements: MG, Potassium Sleep: poor- only gets 3.-4 hrs at night. Can't sleep long. Has been a problem his whole life Stress / self-care:  Current average weekly physical activity: Active outside  24-Hr Dietary Recall Eating 4-5 times per day; grazes vs eating 3 balanced meals.  Estimated Energy Needs Calories: 2000 Carbohydrate: 225g Protein: 150g Fat: 56g   NUTRITION DIAGNOSIS  NB-1.1 Food and nutrition-related knowledge deficit As related to DIabetes.  As evidenced by A1C 10.2%.[name]   NUTRITION INTERVENTION  Nutrition education (E-1) on the following topics:  Nutrition and Diabetes education provided on My Plate, CHO counting, meal planning, portion sizes, timing of meals, avoiding snacks between meals unless having a low blood sugar, target ranges for A1C and blood sugars, signs/symptoms and treatment of hyper/hypoglycemia, monitoring blood sugars, taking medications as prescribed, benefits of exercising 30 minutes per day and prevention of complications of DM.  Lifestyle Medicine  - Whole Food, Plant Predominant Nutrition is highly recommended:  Eat Plenty of vegetables, Mushrooms, fruits, Legumes, Whole Grains, Nuts, seeds in lieu of processed meats, processed snacks/pastries red meat, poultry, eggs.    -It is better to avoid simple carbohydrates including: Cakes, Sweet Desserts, Ice Cream, Soda (diet and regular), Sweet Tea, Candies, Chips, Cookies, Store Bought Juices, Alcohol in Excess of  1-2 drinks a day, Lemonade,  Artificial Sweeteners, Doughnuts, Coffee Creamers, "Sugar-free" Products, etc, etc.  This is not a complete list.....  Exercise: If you are able: 30 -60 minutes a day ,4 days a  week, or 150 minutes a week.  The longer the better.  Combine stretch, strength, and aerobic activities.  If you were told in the past that you have high risk for cardiovascular diseases, you may seek evaluation by your heart doctor prior to initiating moderate to intense exercise programs.   Handouts Provided Include  Know your numbers Lifestyle Medicine  Learning Style & Readiness for Change Teaching method utilized: Visual & Auditory  Demonstrated degree of understanding via: Teach Back  Barriers to learning/adherence to lifestyle change: none  Goals Established by Pt Goals  Increase Lantus to 12 units and take at night 9pm instead of in the am Eat meals on time Avoid snacks with carbs between meals. Eat 45-60 grams of carbs per meal Focus on whole plant based foods   MONITORING & EVALUATION Dietary intake, weekly physical activity, and BS  in 1-2 months.  Next Steps  Patient is to work on meal planning and eating more consistent whole plant based foods.[name]

## 2023-01-22 NOTE — Patient Instructions (Signed)
Goals  Increase Lantus to 12 units and take at night 9pm instead of in the am Eat meals on time Avoid snacks with carbs between meals. Eat 45-60 grams of carbs per meal Focus on whole plant based foods

## 2023-02-04 ENCOUNTER — Other Ambulatory Visit: Payer: Self-pay | Admitting: Adult Health

## 2023-02-04 ENCOUNTER — Ambulatory Visit: Payer: Medicare HMO | Admitting: Adult Health

## 2023-02-04 NOTE — Telephone Encounter (Signed)
The original prescription was discontinued on 10/07/2022 by Desmond Lope, RN. Renewing this prescription may not be appropriate.

## 2023-02-05 ENCOUNTER — Other Ambulatory Visit (HOSPITAL_COMMUNITY): Payer: Self-pay | Admitting: Cardiology

## 2023-02-05 ENCOUNTER — Telehealth: Payer: Self-pay | Admitting: Cardiology

## 2023-02-05 ENCOUNTER — Other Ambulatory Visit (HOSPITAL_BASED_OUTPATIENT_CLINIC_OR_DEPARTMENT_OTHER): Payer: Self-pay

## 2023-02-05 ENCOUNTER — Encounter: Payer: Self-pay | Admitting: Nutrition

## 2023-02-05 MED ORDER — DILTIAZEM HCL ER 120 MG PO CP12: 120.0000 mg | ORAL_CAPSULE | Freq: Every day | ORAL | 1 refills | Status: DC

## 2023-02-05 MED ORDER — DILTIAZEM HCL ER 120 MG PO CP12: 120.0000 mg | ORAL_CAPSULE | Freq: Every day | ORAL | 2 refills | Status: DC

## 2023-02-05 NOTE — Telephone Encounter (Signed)
*  STAT* If patient is at the pharmacy, call can be transferred to refill team.   1. Which medications need to be refilled? (please list name of each medication and dose if known)  diltiazem (CARDIZEM SR) 120 MG 12 hr capsule  2. Which pharmacy/location (including street and city if local pharmacy) is medication to be sent to? [id number] - [place], [place] - [address]  3. Do they need a 30 day or 90 day supply?   90 day supply  Patient states he is completely out of medication.

## 2023-02-05 NOTE — Telephone Encounter (Signed)
Pt's medication was sent to pt's pharmacy as requested. Confirmation received.  °

## 2023-02-06 ENCOUNTER — Other Ambulatory Visit: Payer: Self-pay | Admitting: Adult Health

## 2023-03-11 ENCOUNTER — Telehealth: Payer: Self-pay

## 2023-03-11 ENCOUNTER — Telehealth: Payer: Medicare HMO | Admitting: Adult Health

## 2023-03-11 ENCOUNTER — Other Ambulatory Visit: Payer: Self-pay | Admitting: Adult Health

## 2023-03-11 ENCOUNTER — Encounter: Payer: Self-pay | Admitting: Adult Health

## 2023-03-11 VITALS — Ht 69.5 in | Wt 156.0 lb

## 2023-03-11 DIAGNOSIS — E1169 Type 2 diabetes mellitus with other specified complication: Secondary | ICD-10-CM | POA: Diagnosis not present

## 2023-03-11 DIAGNOSIS — R195 Other fecal abnormalities: Secondary | ICD-10-CM | POA: Diagnosis not present

## 2023-03-11 DIAGNOSIS — R1084 Generalized abdominal pain: Secondary | ICD-10-CM

## 2023-03-11 DIAGNOSIS — Z794 Long term (current) use of insulin: Secondary | ICD-10-CM | POA: Diagnosis not present

## 2023-03-11 NOTE — Telephone Encounter (Signed)
Pt notified of update and verbalized understanding diabetic eye exam.

## 2023-03-11 NOTE — Progress Notes (Signed)
Virtual Visit via Video Note  I connected with Jose Evans  on 03/11/23 at  3:30 PM EST by a video enabled telemedicine application and verified that I am speaking with the correct person using two identifiers.  Location patient: home Location provider:work or home office Persons participating in the virtual visit: patient, provider  I discussed the limitations of evaluation and management by telemedicine and the availability of in person appointments. The patient expressed understanding and agreed to proceed.   HPI: 77 year old male who has a history of diabetes mellitus.  In the past she has had GI issues with metformin, glipizide, and Jardiance.  In September 2024 he was placed on Actos 30 mg and continued on Lantus 10 units daily.  He reports that since starting Actos he has had upset stomach and diarrhea.  He did come off the Actos for about a week and his symptoms resolved to some degree but then once he started Actos his symptoms came back.  He stopped Actos about a week ago but continues to have some GI issues especially when eating and he does have some cramping sensation in his stomach that he has had for quite some time.  His blood sugars have been fairly stable in the mid to high 100s with spikes upwards of 250 after he eats.  He was seen by a dietitian in October who advised him to increase his Lantus to 12 units and switch from taking it in the morning time to the evening.  He did notice that by switching his blood sugars have been more elevated.   ROS: See pertinent positives and negatives per HPI.  Past Medical History:  Diagnosis Date   Adult acne    Cancer (HCC) 07/2017   skin cancer   Diabetes mellitus without complication (HCC)    Dyspnea    Gout    Hypertension    Hypothyroidism    Thyroid disease     Past Surgical History:  Procedure Laterality Date   HERNIA REPAIR  2007   x2   PAROTIDECTOMY Left 10/07/2022   Procedure: LEFT PAROTIDECTOMY;  Surgeon: Christia Reading, MD;  Location: [facility];  Service: ENT;  Laterality: Left;   SHOULDER SURGERY Right 2018   TONSILLECTOMY     age 16    Family History  Problem Relation Age of Onset   Hypertension Other    Colon cancer Neg Hx    Rectal cancer Neg Hx    Stomach cancer Neg Hx    Esophageal cancer Neg Hx        Current Outpatient Medications:    acetaminophen (TYLENOL) 500 MG tablet, Take 1,000 mg by mouth every 6 (six) hours as needed for mild pain., Disp: , Rfl:    allopurinol (ZYLOPRIM) 300 MG tablet, TAKE 1 TABLET BY MOUTH EVERY DAY, Disp: 90 tablet, Rfl: 3   Continuous Glucose Sensor (FREESTYLE LIBRE 3 SENSOR) MISC, 1 Device by Does not apply route every 14 (fourteen) days. Place 1 sensor on the skin every 14 days. Use to check glucose continuously, Disp: 6 each, Rfl: 6   diltiazem (CARDIZEM SR) 120 MG 12 hr capsule, Take 1 capsule (120 mg total) by mouth daily., Disp: 90 capsule, Rfl: 2   finasteride (PROSCAR) 5 MG tablet, TAKE 1 TABLET (5 MG TOTAL) BY MOUTH DAILY., Disp: 90 tablet, Rfl: 3   insulin glargine (LANTUS) 100 UNIT/ML Solostar Pen, Inject 10 Units into the skin daily. Inject 10 units under skin daily, Disp: 15 mL,  Rfl: 11   Insulin Pen Needle (PEN NEEDLES) 31G X 5 MM MISC, 1 each by Does not apply route 3 (three) times daily with meals. May substitute to any manufacturer covered by patient's insurance., Disp: 100 each, Rfl: 3   levothyroxine (SYNTHROID) 150 MCG tablet, Take 1 tablet (150 mcg total) by mouth daily., Disp: 90 tablet, Rfl: 3   lisinopril (ZESTRIL) 5 MG tablet, TAKE 1 TABLET (5 MG TOTAL) BY MOUTH DAILY., Disp: 90 tablet, Rfl: 0   metoprolol succinate (TOPROL-XL) 50 MG 24 hr tablet, TAKE 1 TABLET BY MOUTH EVERY DAY, Disp: 90 tablet, Rfl: 3   pioglitazone (ACTOS) 30 MG tablet, Take 1 tablet (30 mg total) by mouth daily., Disp: 90 tablet, Rfl: 0   rivaroxaban (XARELTO) 20 MG TABS tablet, Take 1 tablet (20 mg total) by mouth daily with supper., Disp: 30  tablet, Rfl: 5   traMADol (ULTRAM) 50 MG tablet, Take 1 tablet (50 mg total) by mouth every 8 (eight) hours as needed., Disp: 60 tablet, Rfl: 2   traZODone (DESYREL) 50 MG tablet, TAKE 1 TABLET BY MOUTH EVERY DAY AT BEDTIME AS NEEDED FOR SLEEP, Disp: 90 tablet, Rfl: 1   triamcinolone lotion (KENALOG) 0.1 %, Apply 1 Application topically 2 (two) times daily as needed., Disp: 60 mL, Rfl: 2   triamterene-hydrochlorothiazide (MAXZIDE) 75-50 MG tablet, TAKE 1 TABLET BY MOUTH EVERY DAY, Disp: 90 tablet, Rfl: 1  EXAM:  VITALS per patient if applicable:  GENERAL: alert, oriented, appears well and in no acute distress  HEENT: atraumatic, conjunttiva clear, no obvious abnormalities on inspection of external nose and ears  NECK: normal movements of the head and neck  LUNGS: on inspection no signs of respiratory distress, breathing rate appears normal, no obvious gross SOB, gasping or wheezing  CV: no obvious cyanosis  MS: moves all visible extremities without noticeable abnormality  PSYCH/NEURO: pleasant and cooperative, no obvious depression or anxiety, speech and thought processing grossly intact  ASSESSMENT AND PLAN:  Discussed the following assessment and plan:  1. Type 2 diabetes mellitus with other specified complication, with long-term current use of insulin (HCC) -Will have him stop Actos.  Will increase his Lantus to 15 units daily.  He can back to using his insulin in the morning.  I will have him follow-up in 1 month at this time he will be due for A1c check  2. Generalized abdominal pain   3. Positive colorectal cancer screening using Cologuard test -In 2022 he had a positive Cologuard, was referred for colonoscopy but at that time his blood pressure was elevated and he could not go through with the colonoscopy.  Now that his blood pressure is controlled I recommend him to call back to gastroenterology and schedule his colonoscopy since he had a positive Cologuard and he is having  abdominal pain and episodes of diarrhea with out taking any oral diabetes medication.      I discussed the assessment and treatment plan with the patient. The patient was provided an opportunity to ask questions and all were answered. The patient agreed with the plan and demonstrated an understanding of the instructions.   The patient was advised to call back or seek an in-person evaluation if the symptoms worsen or if the condition fails to improve as anticipated.   Shirline Frees, NP   Time spent with patient today was 30 minutes which consisted of chart review, discussing  DM Type 2, abdominal pain and positive cologuard, work up, treatment answering questions and  documentation.

## 2023-03-16 ENCOUNTER — Telehealth: Payer: Self-pay | Admitting: Adult Health

## 2023-03-16 DIAGNOSIS — E039 Hypothyroidism, unspecified: Secondary | ICD-10-CM

## 2023-03-16 NOTE — Telephone Encounter (Signed)
Pt's wife Rideout, called in and stated she is worried about pt. She states he has chronic pain and diarrhea that is not being helped by over the counter medicine. She is also worried about pt's mental state, she states he is very low, scared and saying unusual, out of character things. She states pt say Kandee Keen last week so she is not sure whether they need another appointment. She declined to speak to the triage nurse, she would like a call back from Cory's office.

## 2023-03-17 ENCOUNTER — Ambulatory Visit (INDEPENDENT_AMBULATORY_CARE_PROVIDER_SITE_OTHER): Payer: Medicare HMO | Admitting: Adult Health

## 2023-03-17 ENCOUNTER — Encounter: Payer: Self-pay | Admitting: Adult Health

## 2023-03-17 VITALS — BP 110/80 | HR 90 | Temp 98.0°F | Ht 69.5 in | Wt 151.0 lb

## 2023-03-17 DIAGNOSIS — R109 Unspecified abdominal pain: Secondary | ICD-10-CM

## 2023-03-17 DIAGNOSIS — R197 Diarrhea, unspecified: Secondary | ICD-10-CM

## 2023-03-17 DIAGNOSIS — F419 Anxiety disorder, unspecified: Secondary | ICD-10-CM | POA: Diagnosis not present

## 2023-03-17 MED ORDER — DICYCLOMINE HCL 20 MG PO TABS: 20.0000 mg | ORAL_TABLET | Freq: Three times a day (TID) | ORAL | 0 refills | Status: DC

## 2023-03-17 MED ORDER — ALPRAZOLAM 0.25 MG PO TABS
0.2500 mg | ORAL_TABLET | Freq: Two times a day (BID) | ORAL | 0 refills | Status: DC | PRN
Start: 2023-03-17 — End: 2023-04-09

## 2023-03-17 NOTE — Telephone Encounter (Signed)
Pt has been scheduled.  °

## 2023-03-17 NOTE — Telephone Encounter (Signed)
Pt stated that he is having abd pain and diarrhea. Pt denies blood in stool and black stools. Pt stated he was taking imodium and pepto bismol with no relief. Please advise.

## 2023-03-17 NOTE — Progress Notes (Unsigned)
Subjective:    Patient ID: Jose Evans, male    DOB: [birth year], 77 y.o.   MRN: [id number]  HPI 77 year old male who  has a past medical history of Adult acne, Cancer (HCC) (07/2017), Diabetes mellitus without complication (HCC), Dyspnea, Gout, Hypertension, Hypothyroidism, and Thyroid disease.  His wife is with him today to help provide history   He presents to the office today for follow-up regarding abdominal pain and diarrhea.  We had a virtual visit with him 6 days ago at which time he reported GI issues such as diarrhea and abdominal pain while taking Actos for diabetes management.  He did try coming off Actos and his symptoms improved to some degree but when he started Actos his symptoms came back.  He stopped Actos again about 2 weeks ago but continued to have GI issues.  He reported that when eating he will have some cramping sensation in his stomach and diarrhea.  He did have a positive Cologuard done in 2022, was scheduled for colonoscopy but his blood pressures were elevated so the colonoscopy cannot be done.  He did not call back to get his colonoscopy scheduled until this week and now has an appointment set for February 2025- he is on the cancellation list.   He reports today, that his symptoms are the same every day.  He reports that he will wake up feeling great and within an hour he starts to get stomach "tightness and cramping" and then diarrhea starts.  His symptoms last into the afternoon and sometimes into the evening.  It does not matter if he eats or drinks anything his symptoms are present.  He has not had a normal bowel movement in multiple months.  He has not noticed any blood in his stool.  He does not have any nausea or vomiting.  Only time he gets relief from his abdominal pain is when he takes traumagram at all that he has prescribed for osteoarthritic pain.  At home he has used over-the-counter Imodium and Pepto for diarrhea and abdominal pain but this has not been  effective.  He has also tried the brat diet but this did not help either.  His wife reports that since his quality of life is quite diminished being that he cannot leave the house very often due to constant diarrhea and abdominal pain he has developed anxiety and depression.    Wt Readings from Last 3 Encounters:  03/17/23 151 lb (68.5 kg)  03/11/23 156 lb (70.8 kg)  01/16/23 156 lb (70.8 kg)   Review of Systems See HPI   Past Medical History:  Diagnosis Date   Adult acne    Cancer (HCC) 07/2017   skin cancer   Diabetes mellitus without complication (HCC)    Dyspnea    Gout    Hypertension    Hypothyroidism    Thyroid disease     Social History   Socioeconomic History   Marital status: Married    Spouse name: Not on file   Number of children: Not on file   Years of education: Not on file   Highest education level: Some college, no degree  Occupational History   Not on file  Tobacco Use   Smoking status: Former    Current packs/day: 0.00    Average packs/day: 0.5 packs/day for 30.0 years (15.0 ttl pk-yrs)    Types: Cigarettes    Start date: 09/08/1975    Quit date: 09/07/2005    Years since quitting:  17.5   Smokeless tobacco: Never  Vaping Use   Vaping status: Never Used  Substance and Sexual Activity   Alcohol use: Yes    Comment: occasional   Drug use: No   Sexual activity: Not on file  Other Topics Concern   Not on file  Social History Narrative   Works for [employer]    Married   m   Social Determinants of Health   Financial Resource Strain: Low Risk  (02/02/2023)   Overall Financial Resource Strain (CARDIA)    Difficulty of Paying Living Expenses: Not very hard  Food Insecurity: No Food Insecurity (02/02/2023)   Hunger Vital Sign    Worried About Running Out of Food in the Last Year: Never true    Ran Out of Food in the Last Year: Never true  Transportation Needs: No Transportation Needs (02/02/2023)   PRAPARE - Therapist, art (Medical): No    Lack of Transportation (Non-Medical): No  Physical Activity: Insufficiently Active (02/02/2023)   Exercise Vital Sign    Days of Exercise per Week: 2 days    Minutes of Exercise per Session: 60 min  Stress: Stress Concern Present (02/02/2023)   [employer] of Occupational Health - Occupational Stress Questionnaire    Feeling of Stress : To some extent  Social Connections: Moderately Isolated (02/02/2023)   Social Connection and Isolation Panel [NHANES]    Frequency of Communication with Friends and Family: More than three times a week    Frequency of Social Gatherings with Friends and Family: Twice a week    Attends Religious Services: Never    Database administrator or Organizations: No    Attends Banker Meetings: Never    Marital Status: Married  Catering manager Violence: Not At Risk (03/28/2022)   Humiliation, Afraid, Rape, and Kick questionnaire    Fear of Current or Ex-Partner: No    Emotionally Abused: No    Physically Abused: No    Sexually Abused: No    Past Surgical History:  Procedure Laterality Date   HERNIA REPAIR  2007   x2   PAROTIDECTOMY Left 10/07/2022   Procedure: LEFT PAROTIDECTOMY;  Surgeon: Christia Reading, MD;  Location: [facility];  Service: ENT;  Laterality: Left;   SHOULDER SURGERY Right 2018   TONSILLECTOMY     age 58    Family History  Problem Relation Age of Onset   Hypertension Other    Colon cancer Neg Hx    Rectal cancer Neg Hx    Stomach cancer Neg Hx    Esophageal cancer Neg Hx     Allergies  Allergen Reactions   Actos [Pioglitazone] Diarrhea   Glipizide     Abdominal pain    Glucophage [Metformin] Diarrhea   Jardiance [Empagliflozin]     Abdominal pain     Current Outpatient Medications on File Prior to Visit  Medication Sig Dispense Refill   acetaminophen (TYLENOL) 500 MG tablet Take 1,000 mg by mouth every 6 (six) hours as needed for mild pain.      allopurinol (ZYLOPRIM) 300 MG tablet TAKE 1 TABLET BY MOUTH EVERY DAY 90 tablet 3   Continuous Glucose Sensor (FREESTYLE LIBRE 3 SENSOR) MISC 1 Device by Does not apply route every 14 (fourteen) days. Place 1 sensor on the skin every 14 days. Use to check glucose continuously 6 each 6   diltiazem (CARDIZEM SR) 120 MG 12 hr capsule Take 1 capsule (120  mg total) by mouth daily. 90 capsule 2   finasteride (PROSCAR) 5 MG tablet TAKE 1 TABLET (5 MG TOTAL) BY MOUTH DAILY. 90 tablet 3   insulin glargine (LANTUS) 100 UNIT/ML Solostar Pen Inject 10 Units into the skin daily. Inject 10 units under skin daily (Patient taking differently: Inject 15 Units into the skin daily. Inject 10 units under skin daily) 15 mL 11   Insulin Pen Needle (PEN NEEDLES) 31G X 5 MM MISC 1 each by Does not apply route 3 (three) times daily with meals. May substitute to any manufacturer covered by patient's insurance. 100 each 3   levothyroxine (SYNTHROID) 150 MCG tablet Take 1 tablet (150 mcg total) by mouth daily. 90 tablet 3   lisinopril (ZESTRIL) 5 MG tablet TAKE 1 TABLET (5 MG TOTAL) BY MOUTH DAILY. 90 tablet 0   metoprolol succinate (TOPROL-XL) 50 MG 24 hr tablet TAKE 1 TABLET BY MOUTH EVERY DAY 90 tablet 3   rivaroxaban (XARELTO) 20 MG TABS tablet Take 1 tablet (20 mg total) by mouth daily with supper. 30 tablet 5   traMADol (ULTRAM) 50 MG tablet Take 1 tablet (50 mg total) by mouth every 8 (eight) hours as needed. 60 tablet 2   traZODone (DESYREL) 50 MG tablet TAKE 1 TABLET BY MOUTH EVERY DAY AT BEDTIME AS NEEDED FOR SLEEP 90 tablet 1   triamcinolone lotion (KENALOG) 0.1 % Apply 1 Application topically 2 (two) times daily as needed. 60 mL 2   triamterene-hydrochlorothiazide (MAXZIDE) 75-50 MG tablet TAKE 1 TABLET BY MOUTH EVERY DAY 90 tablet 1   No current facility-administered medications on file prior to visit.    BP 110/80   Pulse 90   Temp 98 F (36.7 C) (Oral)   Ht 5' 9.5" (1.765 m)   Wt 151 lb (68.5 kg)   SpO2  98%   BMI 21.98 kg/m       Objective:   Physical Exam Vitals and nursing note reviewed.  Constitutional:      Appearance: Normal appearance.  Cardiovascular:     Rate and Rhythm: Normal rate and regular rhythm.     Pulses: Normal pulses.     Heart sounds: Normal heart sounds.  Pulmonary:     Effort: Pulmonary effort is normal.     Breath sounds: Normal breath sounds.  Musculoskeletal:        General: Normal range of motion.  Skin:    General: Skin is warm and dry.  Neurological:     General: No focal deficit present.     Mental Status: He is alert and oriented to person, place, and time.  Psychiatric:        Mood and Affect: Mood normal.        Behavior: Behavior normal.        Thought Content: Thought content normal.        Judgment: Judgment normal.        Assessment & Plan:  1. Abdominal cramping -Will check CBC, CMP, TSH and GI panel today.  Will order CT abdomen pelvis with contrast.  Will send in Bentyl to help with abdominal cramping and diarrhea. -I am wondering if Lantus is the causative factor but cannot rule out other issues such as inflammatory bowel disease or colon cancer.  Will consider replacing Lantus with Evaristo Bury which has a lower percentage of having diarrhea with usage. - CBC; Future - Comprehensive metabolic panel; Future - Gastrointestinal Panel by PCR , Stool; Future - CT ABDOMEN PELVIS WO CONTRAST;  Future - dicyclomine (BENTYL) 20 MG tablet; Take 1 tablet (20 mg total) by mouth 4 (four) times daily -  before meals and at bedtime.  Dispense: 120 tablet; Refill: 0 - TSH; Future - Comprehensive metabolic panel - CBC - TSH  2. Diarrhea, unspecified type  - CBC; Future - Comprehensive metabolic panel; Future - Gastrointestinal Panel by PCR , Stool; Future - CT ABDOMEN PELVIS WO CONTRAST; Future - dicyclomine (BENTYL) 20 MG tablet; Take 1 tablet (20 mg total) by mouth 4 (four) times daily -  before meals and at bedtime.  Dispense: 120 tablet;  Refill: 0 - TSH; Future - Comprehensive metabolic panel - CBC - TSH  3. Anxiety -I will hold off on placing him on an SSRI due to the chance that it may cause worsening diarrhea.  Will give a low-dose Xanax that he can take twice daily as needed. - ALPRAZolam (XANAX) 0.25 MG tablet; Take 1 tablet (0.25 mg total) by mouth 2 (two) times daily as needed for anxiety.  Dispense: 20 tablet; Refill: 0  Shirline Frees, NP  Time spent with patient today was 41 minutes which consisted of chart review, discussing abdominal pain, diarrhea, and anxiety  work up, treatment answering questions and documentation.

## 2023-03-18 ENCOUNTER — Encounter: Payer: Self-pay | Admitting: Adult Health

## 2023-03-18 LAB — COMPREHENSIVE METABOLIC PANEL
ALT: 54 U/L — ABNORMAL HIGH (ref 0–53)
AST: 33 U/L (ref 0–37)
Albumin: 4.1 g/dL (ref 3.5–5.2)
Alkaline Phosphatase: 67 U/L (ref 39–117)
BUN: 21 mg/dL (ref 6–23)
CO2: 28 meq/L (ref 19–32)
Calcium: 9.5 mg/dL (ref 8.4–10.5)
Chloride: 101 meq/L (ref 96–112)
Creatinine, Ser: 1.06 mg/dL (ref 0.40–1.50)
GFR: 68.69 mL/min (ref >60.00)
Glucose, Bld: 109 mg/dL — ABNORMAL HIGH (ref 70–99)
Potassium: 3.3 meq/L — ABNORMAL LOW (ref 3.5–5.1)
Sodium: 137 meq/L (ref 135–145)
Total Bilirubin: 0.5 mg/dL (ref 0.2–1.2)
Total Protein: 6.5 g/dL (ref 6.0–8.3)

## 2023-03-18 LAB — TSH: TSH: 10.06 u[IU]/mL — ABNORMAL HIGH (ref 0.35–5.50)

## 2023-03-18 LAB — CBC
HCT: 40.4 % (ref 39.0–52.0)
Hemoglobin: 13.4 g/dL (ref 13.0–17.0)
MCHC: 33.3 g/dL (ref 30.0–36.0)
MCV: 92.9 fL (ref 78.0–100.0)
Platelets: 292 10*3/uL (ref 150.0–400.0)
RBC: 4.35 Mil/uL (ref 4.22–5.81)
RDW: 14.9 % (ref 11.5–15.5)
WBC: 8.8 10*3/uL (ref 4.0–10.5)

## 2023-03-18 MED ORDER — LEVOTHYROXINE SODIUM 175 MCG PO TABS: 175.0000 ug | ORAL_TABLET | Freq: Every day | ORAL | 0 refills | Status: DC

## 2023-03-18 NOTE — Telephone Encounter (Signed)
Spoke to patient and informed him of his labs   He has not been able to pick up Bentyl from the pharmacy yet.   His labs show TSH of 10 - will increase his synthroid to 175 mcg x 30 days

## 2023-03-23 ENCOUNTER — Other Ambulatory Visit: Payer: Self-pay | Admitting: Emergency Medicine

## 2023-03-23 ENCOUNTER — Other Ambulatory Visit: Payer: Self-pay | Admitting: Adult Health

## 2023-03-23 DIAGNOSIS — R109 Unspecified abdominal pain: Secondary | ICD-10-CM

## 2023-03-23 DIAGNOSIS — R197 Diarrhea, unspecified: Secondary | ICD-10-CM | POA: Diagnosis not present

## 2023-03-25 ENCOUNTER — Ambulatory Visit: Payer: Medicare HMO | Admitting: Nutrition

## 2023-03-25 ENCOUNTER — Ambulatory Visit
Admission: RE | Admit: 2023-03-25 | Discharge: 2023-03-25 | Disposition: A | Discharge status: Home / Self Care | Payer: Medicare HMO | Source: Ambulatory Visit | Attending: Adult Health | Admitting: Adult Health

## 2023-03-25 DIAGNOSIS — K861 Other chronic pancreatitis: Secondary | ICD-10-CM | POA: Diagnosis not present

## 2023-03-25 DIAGNOSIS — R109 Unspecified abdominal pain: Secondary | ICD-10-CM

## 2023-03-25 DIAGNOSIS — K8020 Calculus of gallbladder without cholecystitis without obstruction: Secondary | ICD-10-CM | POA: Diagnosis not present

## 2023-03-25 DIAGNOSIS — I700 Atherosclerosis of aorta: Secondary | ICD-10-CM | POA: Diagnosis not present

## 2023-03-25 DIAGNOSIS — R197 Diarrhea, unspecified: Secondary | ICD-10-CM

## 2023-03-25 DIAGNOSIS — K5730 Diverticulosis of large intestine without perforation or abscess without bleeding: Secondary | ICD-10-CM | POA: Diagnosis not present

## 2023-03-25 LAB — GASTROINTESTINAL PATHOGEN PNL
CampyloBacter Group: NOT DETECTED
Norovirus GI/GII: NOT DETECTED
Rotavirus A: NOT DETECTED
Salmonella species: NOT DETECTED
Shiga Toxin 1: NOT DETECTED
Shiga Toxin 2: NOT DETECTED
Shigella Species: NOT DETECTED
Vibrio Group: NOT DETECTED
Yersinia enterocolitica: NOT DETECTED

## 2023-03-31 ENCOUNTER — Encounter: Payer: Self-pay | Admitting: Adult Health

## 2023-04-02 ENCOUNTER — Telehealth: Payer: Self-pay

## 2023-04-02 ENCOUNTER — Ambulatory Visit: Payer: Medicare HMO | Admitting: Family Medicine

## 2023-04-02 DIAGNOSIS — Z0000 Encounter for general adult medical examination without abnormal findings: Secondary | ICD-10-CM

## 2023-04-02 NOTE — Patient Instructions (Addendum)
I really enjoyed getting to talk with you today! I am available on Tuesdays and Thursdays for virtual visits if you have any questions or concerns, or if I can be of any further assistance.   CHECKLIST FROM ANNUAL WELLNESS VISIT:  -Follow up (please call to schedule if not scheduled after visit):   -yearly for annual wellness visit with primary care office  Here is a list of your preventive care/health maintenance measures and the plan for each if any are due:  PLAN For any measures below that may be due:  -if you wish to get the vaccines, can do so at the pharmacy -please keep visit with gastroenterology for colonoscopy and see after CT if you can get a sooner visit  Health Maintenance  Topic Date Due   Zoster Vaccines- Shingrix (1 of 2) Never done   Colonoscopy  Never done   DTaP/Tdap/Td (2 - Td or Tdap) 06/15/2021   COVID-19 Vaccine (5 - 2024-25 season) 12/21/2022   HEMOGLOBIN A1C  07/16/2023   Diabetic kidney evaluation - Urine ACR  10/10/2023   FOOT EXAM  11/12/2023   OPHTHALMOLOGY EXAM  01/13/2024   Diabetic kidney evaluation - eGFR measurement  03/16/2024   Medicare Annual Wellness (AWV)  04/01/2024   Pneumonia Vaccine 54+ Years old  Completed   INFLUENZA VACCINE  Completed   Hepatitis C Screening  Completed   HPV VACCINES  Aged Out    -See a dentist at least yearly  -Get your eyes checked and then per your eye specialist's recommendations  -Other issues addressed today:   -I have included below further information regarding a healthy whole foods based diet, physical activity guidelines for adults, stress management and opportunities for social connections. I hope you find this information useful.   -----------------------------------------------------------------------------------------------------------------------------------------------------------------------------------------------------------------------------------------------------------  NUTRITION: -eat  real food: lots of colorful vegetables (half the plate) and fruits -5-7 servings of vegetables and fruits per day (fresh or steamed is best), exp. 2 servings of vegetables with lunch and dinner and 2 servings of fruit per day. Berries and greens such as kale and collards are great choices.  -consume on a regular basis: whole grains (make sure first ingredient on label contains the word "whole"), fresh fruits, fish, nuts, seeds, healthy oils (such as olive oil, avocado oil, grape seed oil) -may eat small amounts of dairy and lean meat on occasion, but avoid processed meats such as ham, bacon, lunch meat, etc. -drink water -try to avoid fast food and pre-packaged foods, processed meat -most experts advise limiting sodium to < 2300mg  per day, should limit further is any chronic conditions such as high blood pressure, heart disease, diabetes, etc. The American Heart Association advised that < 1500mg  is is ideal -try to avoid foods that contain any ingredients with names you do not recognize  -try to avoid sugar/sweets (except for the natural sugar that occurs in fresh fruit) -try to avoid sweet drinks -try to avoid white rice, white bread, pasta (unless whole grain), white or yellow potatoes  EXERCISE GUIDELINES FOR ADULTS: -if you wish to increase your physical activity, do so gradually and with the approval of your doctor -STOP and seek medical care immediately if you have any chest pain, chest discomfort or trouble breathing when starting or increasing exercise  -move and stretch your body, legs, feet and arms when sitting for long periods -Physical activity guidelines for optimal health in adults: -least 150 minutes per week of aerobic exercise (can talk, but not sing) once approved by your  doctor, 20-30 minutes of sustained activity or two 10 minute episodes of sustained activity every day.  -resistance training at least 2 days per week if approved by your doctor -balance exercises 3+ days per  week:   Stand somewhere where you have something sturdy to hold onto if you lose balance.    1) lift up on toes, start with 5x per day and work up to 20x   2) stand and lift on leg straight out to the side so that foot is a few inches of the floor, start with 5x each side and work up to 20x each side   3) stand on one foot, start with 5 seconds each side and work up to 20 seconds on each side  If you need ideas or help with getting more active:  -Silver sneakers https://tools.silversneakers.com  -Walk with a Doc: http://www.duncan-williams.com/  -try to include resistance (weight lifting/strength building) and balance exercises twice per week: or the following link for ideas: http://castillo-powell.com/  BuyDucts.dk  STRESS MANAGEMENT: -can try meditating, or just sitting quietly with deep breathing while intentionally relaxing all parts of your body for 5 minutes daily -if you need further help with stress, anxiety or depression please follow up with your primary doctor or contact the wonderful folks at [facility]: [phone]  SOCIAL CONNECTIONS: -options in [place] if you wish to engage in more social and exercise related activities:  -Silver sneakers https://tools.silversneakers.com  -Walk with a Doc: http://www.duncan-williams.com/  -Check out the [facility] Active Adults 50+ section on the [employer] (hiking clubs, book clubs, cards and games, chess, exercise classes, aquatic classes and much more) - see the website for details: https://www.[place].gov/departments/parks-recreation/active-adults50  -YouTube has lots of exercise videos for different ages and abilities as well  -[facility] (a variety of indoor and outdoor inperson activities for adults). 6361018597. [address]  -Virtual Online Classes (a variety of topics): see  seniorplanet.org or call ([phone]) [phone]  -consider volunteering at a school, hospice center, church, senior center or elsewhere

## 2023-04-02 NOTE — Progress Notes (Signed)
PATIENT CHECK-IN and HEALTH RISK ASSESSMENT QUESTIONNAIRE:  -completed by phone/video for upcoming Medicare Preventive Visit    Pre-Visit Check-in: 1)Vitals (height, wt, BP, etc) - record in vitals section for visit on day of visit Request home vitals (wt, BP, etc.) and enter into vitals, THEN update Vital Signs SmartPhrase below at the top of the HPI. See below.  2)Review and Update Medications, Allergies PMH, Surgeries, Social history in Epic 3)Hospitalizations in the last year with date/reason? Shoulder surgery  4)Review and Update Care Team (patient's specialists) in Epic 5) Complete PHQ9 in Epic  6) Complete Fall Screening in Epic 7)Review all Health Maintenance Due and order under PCP if not done.  Medicare Wellness Patient Questionnaire:  Answer theses question about your habits: How often do you have a drink containing alcohol? A few oz of wine daily How often do you have six or more drinks on one occasion?never Have you ever smoked? Quit about 20 years ago Do you use an illicit drugs? Occ MJ use On average, how many days per week do you engage in moderate to strenuous exercise (like a brisk walk)?  Some home exercises, also does some walking On average, how many minutes do you engage in exercise at this level?  No concerns for STIs Feels diet is good, eats well, saw a nutritionist - doesn't eat many sweets, right now is more concerned with quality rather than quantity of life  Answer theses question about your everyday activities: Can you perform most household chores? yes Are you deaf or have significant trouble hearing? Yes - chronic Do you feel that you have a problem with memory?no Do you feel safe at home? yes Last dentist visit? Goes on a regular basis 8. Do you have any difficulty performing your everyday activities? no Are you having any difficulty walking, taking medications on your own, and or difficulty managing daily home needs?no Do you have difficulty  walking or climbing stairs?no Do you have difficulty dressing or bathing?no Do you have difficulty doing errands alone such as visiting a doctor's office or shopping?no Do you currently have any difficulty preparing food and eating?no Do you currently have any difficulty using the toilet?no Do you have any difficulty managing your finances?no Do you have any difficulties with housekeeping of managing your housekeeping?no   Do you have Advanced Directives in place (Living Will, Healthcare Power or Attorney)? yes   Last eye Exam and location? Gets eye doctor yearly   Do you currently use prescribed or non-prescribed narcotic or opioid pain medications? Occ tramadol - but not on a regular basis   ----------------------------------------------------------------------------------------------------------------------------------------------------------------------------------------------------------------------  Because this visit was a virtual/telehealth visit, some criteria may be missing or patient reported. Any vitals not documented were not able to be obtained and vitals that have been documented are patient reported.    MEDICARE ANNUAL PREVENTIVE CARE VISIT WITH PROVIDER (Welcome to Medicare, initial annual wellness or annual wellness exam)  Virtual Visit via Video  Note  I connected with Delores Alder on 04/02/23  by a video enabled telemedicine application and verified that I am speaking with the correct person using two identifiers.  Location patient: home Location provider:work or home office Persons participating in the virtual visit: patient, provider  Concerns and/or follow up today: stable since last visit with Kandee Keen recently, cramps are improved.  He has not been able to tolerate diabetes medications. He has appt with GI and CT is pending for this and wt loss w/ the diarrhea.    See  HM section in Epic for other details of completed HM.    ROS: negative for report of  fevers, vision changes, vision loss, hearing loss or change, chest pain, sob, hemoptysis, melena, hematochezia, hematuria, falls, bleeding or bruising, thoughts of suicide or self harm, memory loss  Patient-completed extensive health risk assessment - reviewed and discussed with the patient: See Health Risk Assessment completed with patient prior to the visit either above or in recent phone note. This was reviewed in detailed with the patient today and appropriate recommendations, orders and referrals were placed as needed per Summary below and patient instructions.   Review of Medical History: -PMH, PSH, Family History and current specialty and care providers reviewed and updated and listed below   Patient Care Team: Shirline Frees, NP as PCP - General (Family Medicine) Regan Lemming, MD as PCP - Electrophysiology (Cardiology) Verner Chol, [facility] (Inactive) as Pharmacist (Pharmacist)   Past Medical History:  Diagnosis Date   Adult acne    Cancer (HCC) 07/2017   skin cancer   Diabetes mellitus without complication (HCC)    Dyspnea    Gout    Hypertension    Hypothyroidism    Thyroid disease     Past Surgical History:  Procedure Laterality Date   HERNIA REPAIR  2007   x2   PAROTIDECTOMY Left 10/07/2022   Procedure: LEFT PAROTIDECTOMY;  Surgeon: Christia Reading, MD;  Location: [facility];  Service: ENT;  Laterality: Left;   SHOULDER SURGERY Right 2018   TONSILLECTOMY     age 30    Social History   Socioeconomic History   Marital status: Married    Spouse name: Not on file   Number of children: Not on file   Years of education: Not on file   Highest education level: Some college, no degree  Occupational History   Not on file  Tobacco Use   Smoking status: Former    Current packs/day: 0.00    Average packs/day: 0.5 packs/day for 30.0 years (15.0 ttl pk-yrs)    Types: Cigarettes    Start date: 09/08/1975    Quit date: 09/07/2005    Years since  quitting: 17.5   Smokeless tobacco: Never  Vaping Use   Vaping status: Never Used  Substance and Sexual Activity   Alcohol use: Yes    Comment: occasional   Drug use: No   Sexual activity: Not on file  Other Topics Concern   Not on file  Social History Narrative   Works for [employer]    Married   m   Social Drivers of Corporate investment banker Strain: Low Risk  (02/02/2023)   Overall Financial Resource Strain (CARDIA)    Difficulty of Paying Living Expenses: Not very hard  Food Insecurity: No Food Insecurity (02/02/2023)   Hunger Vital Sign    Worried About Running Out of Food in the Last Year: Never true    Ran Out of Food in the Last Year: Never true  Transportation Needs: No Transportation Needs (02/02/2023)   PRAPARE - Administrator, Civil Service (Medical): No    Lack of Transportation (Non-Medical): No  Physical Activity: Insufficiently Active (02/02/2023)   Exercise Vital Sign    Days of Exercise per Week: 2 days    Minutes of Exercise per Session: 60 min  Stress: Stress Concern Present (02/02/2023)   [employer] of Occupational Health - Occupational Stress Questionnaire    Feeling of Stress : To  some extent  Social Connections: Moderately Isolated (02/02/2023)   Social Connection and Isolation Panel [NHANES]    Frequency of Communication with Friends and Family: More than three times a week    Frequency of Social Gatherings with Friends and Family: Twice a week    Attends Religious Services: Never    Database administrator or Organizations: No    Attends Engineer, structural: Not on file    Marital Status: Married  Catering manager Violence: Not At Risk (03/28/2022)   Humiliation, Afraid, Rape, and Kick questionnaire    Fear of Current or Ex-Partner: No    Emotionally Abused: No    Physically Abused: No    Sexually Abused: No    Family History  Problem Relation Age of Onset   Hypertension Other    Colon cancer Neg  Hx    Rectal cancer Neg Hx    Stomach cancer Neg Hx    Esophageal cancer Neg Hx     Current Outpatient Medications on File Prior to Visit  Medication Sig Dispense Refill   acetaminophen (TYLENOL) 500 MG tablet Take 1,000 mg by mouth every 6 (six) hours as needed for mild pain.     allopurinol (ZYLOPRIM) 300 MG tablet TAKE 1 TABLET BY MOUTH EVERY DAY 90 tablet 3   ALPRAZolam (XANAX) 0.25 MG tablet Take 1 tablet (0.25 mg total) by mouth 2 (two) times daily as needed for anxiety. 20 tablet 0   Continuous Glucose Sensor (FREESTYLE LIBRE 3 SENSOR) MISC 1 Device by Does not apply route every 14 (fourteen) days. Place 1 sensor on the skin every 14 days. Use to check glucose continuously 6 each 6   dicyclomine (BENTYL) 20 MG tablet Take 1 tablet (20 mg total) by mouth 4 (four) times daily -  before meals and at bedtime. 120 tablet 0   diltiazem (CARDIZEM SR) 120 MG 12 hr capsule Take 1 capsule (120 mg total) by mouth daily. 90 capsule 2   finasteride (PROSCAR) 5 MG tablet TAKE 1 TABLET (5 MG TOTAL) BY MOUTH DAILY. 90 tablet 3   insulin glargine (LANTUS) 100 UNIT/ML Solostar Pen Inject 10 Units into the skin daily. Inject 10 units under skin daily (Patient taking differently: Inject 15 Units into the skin daily. Inject 10 units under skin daily) 15 mL 11   Insulin Pen Needle (PEN NEEDLES) 31G X 5 MM MISC 1 each by Does not apply route 3 (three) times daily with meals. May substitute to any manufacturer covered by patient's insurance. 100 each 3   levothyroxine (SYNTHROID) 175 MCG tablet Take 1 tablet (175 mcg total) by mouth daily. 30 tablet 0   lisinopril (ZESTRIL) 5 MG tablet TAKE 1 TABLET (5 MG TOTAL) BY MOUTH DAILY. 90 tablet 0   metoprolol succinate (TOPROL-XL) 50 MG 24 hr tablet TAKE 1 TABLET BY MOUTH EVERY DAY 90 tablet 3   rivaroxaban (XARELTO) 20 MG TABS tablet Take 1 tablet (20 mg total) by mouth daily with supper. 30 tablet 5   traMADol (ULTRAM) 50 MG tablet Take 1 tablet (50 mg total) by  mouth every 8 (eight) hours as needed. 60 tablet 2   traZODone (DESYREL) 50 MG tablet TAKE 1 TABLET BY MOUTH EVERY DAY AT BEDTIME AS NEEDED FOR SLEEP 90 tablet 1   triamcinolone lotion (KENALOG) 0.1 % Apply 1 Application topically 2 (two) times daily as needed. 60 mL 2   triamterene-hydrochlorothiazide (MAXZIDE) 75-50 MG tablet TAKE 1 TABLET BY MOUTH EVERY DAY 90 tablet  1   No current facility-administered medications on file prior to visit.    Allergies  Allergen Reactions   Actos [Pioglitazone] Diarrhea   Glipizide     Abdominal pain    Glucophage [Metformin] Diarrhea   Jardiance [Empagliflozin]     Abdominal pain        Physical Exam Vitals requested from patient and listed below if patient had equipment and was able to obtain at home for this virtual visit: There were no vitals filed for this visit. Estimated body mass index is 21.98 kg/m as calculated from the following:   Height as of 03/17/23: 5' 9.5" (1.765 m).   Weight as of 03/17/23: 151 lb (68.5 kg).  EKG (optional): deferred due to virtual visit  GENERAL: alert, oriented, no acute distress detected; full vision exam deferred due to pandemic and/or virtual encounter  HEENT: atraumatic, conjunttiva clear, no obvious abnormalities on inspection of external nose and ears  NECK: normal movements of the head and neck  LUNGS: on inspection no signs of respiratory distress, breathing rate appears normal, no obvious gross SOB, gasping or wheezing  CV: no obvious cyanosis  MS: moves all visible extremities without noticeable abnormality  PSYCH/NEURO: pleasant and cooperative, no obvious depression or anxiety, speech and thought processing grossly intact, Cognitive function grossly intact  Flowsheet Row Office Visit from 03/17/2023 in [facility] [place]  PHQ-9 Total Score 0           04/02/2023    4:59 PM 03/17/2023    4:00 PM 01/22/2023    8:10 AM 10/10/2022   10:22 AM 04/23/2022   11:28  AM  Depression screen PHQ 2/9  Decreased Interest 1 0 0 0 0  Down, Depressed, Hopeless 1 0 0 0 0  PHQ - 2 Score 2 0 0 0 0  Altered sleeping  0  0   Tired, decreased energy  0  0   Change in appetite  0  0   Feeling bad or failure about yourself   0  0   Trouble concentrating  0  0   Moving slowly or fidgety/restless  0  0   Suicidal thoughts  0  0   PHQ-9 Score  0  0   Difficult doing work/chores  Not difficult at all  Not difficult at all   Somewhat down with shoulder injuries this year and not being able to do the things he wants to do.      03/28/2022   11:32 AM 04/22/2022    7:55 PM 01/22/2023    8:10 AM 03/29/2023    8:58 AM 04/02/2023    4:59 PM  Fall Risk  Falls in the past year? 0 1  0 0  1  Was there an injury with Fall? 0 1  0 0  0  Fall Risk Category Calculator 0 2 0 0  1  Fall Risk Category (Retired) Low Moderate     (RETIRED) Patient Fall Risk Level Low fall risk      Patient at Risk for Falls Due to No Fall Risks      Fall risk Follow up Falls prevention discussed         Patient-reported     SUMMARY AND PLAN:  Encounter for Medicare annual wellness exam   Discussed applicable health maintenance/preventive health measures and advised and referred or ordered per patient preferences: -reports is seeing GI next month for visit to schedule colonoscopy and to review GI symptoms he  has had this year - reports was scheduled for one but it had to be canceled due to A. Fib, but reports PCP got CT and is working to see if can gt the colonoscopy too pending CT. Reports PCP is working to try to get him in sooner with GI.  -discussed vaccines due - he knows can get at the pharmacy Health Maintenance  Topic Date Due   Zoster Vaccines- Shingrix (1 of 2) Never done   Colonoscopy  Never done   DTaP/Tdap/Td (2 - Td or Tdap) 06/15/2021   COVID-19 Vaccine (5 - 2024-25 season) 12/21/2022   HEMOGLOBIN A1C  07/16/2023   Diabetic kidney evaluation - Urine ACR  10/10/2023   FOOT  EXAM  11/12/2023   OPHTHALMOLOGY EXAM  01/13/2024   Diabetic kidney evaluation - eGFR measurement  03/16/2024   Medicare Annual Wellness (AWV)  04/01/2024   Pneumonia Vaccine 68+ Years old  Completed   INFLUENZA VACCINE  Completed   Hepatitis C Screening  Completed   HPV VACCINES  Aged Out     Education and counseling on the following was provided based on the above review of health and a plan/checklist for the patient, along with additional information discussed, was provided for the patient in the patient instructions :  -he wishes to wait on the CT and GI visits for now before he pursuing anything else -Provided safe balance exercises that can be done at home to improve balance and discussed exercise guidelines for adults with include balance exercises at least 3 days per week - see patient instructions.  -advised of ortho urgent care for shoulder issues -Advised and counseled on a healthy lifestyle - including the importance of a healthy diet, regular physical activity, social connections/doing things he enjoys -Reviewed patient's current diet. A summary of a healthy diet was provided in the Patient Instructions.  -reviewed patient's current physical activity level. Provided community resources and ideas for safe exercise at home to assist in meeting exercise guideline recommendations in a safe and healthy way.  -Advise yearly dental visits at minimum and regular eye exams -Advised and counseled on alcohol safe limits, counseled on risks with MJ  Follow up: see patient instructions   Patient Instructions  I really enjoyed getting to talk with you today! I am available on Tuesdays and Thursdays for virtual visits if you have any questions or concerns, or if I can be of any further assistance.   CHECKLIST FROM ANNUAL WELLNESS VISIT:  -Follow up (please call to schedule if not scheduled after visit):   -yearly for annual wellness visit with primary care office  Here is a list of  your preventive care/health maintenance measures and the plan for each if any are due:  PLAN For any measures below that may be due:  -if you wish to get the vaccines, can do so at the pharmacy -please keep visit with gastroenterology for colonoscopy and see after CT if you can get a sooner visit  Health Maintenance  Topic Date Due   Zoster Vaccines- Shingrix (1 of 2) Never done   Colonoscopy  Never done   DTaP/Tdap/Td (2 - Td or Tdap) 06/15/2021   COVID-19 Vaccine (5 - 2024-25 season) 12/21/2022   HEMOGLOBIN A1C  07/16/2023   Diabetic kidney evaluation - Urine ACR  10/10/2023   FOOT EXAM  11/12/2023   OPHTHALMOLOGY EXAM  01/13/2024   Diabetic kidney evaluation - eGFR measurement  03/16/2024   Medicare Annual Wellness (AWV)  04/01/2024   Pneumonia  Vaccine 69+ Years old  Completed   INFLUENZA VACCINE  Completed   Hepatitis C Screening  Completed   HPV VACCINES  Aged Out    -See a dentist at least yearly  -Get your eyes checked and then per your eye specialist's recommendations  -Other issues addressed today:   -I have included below further information regarding a healthy whole foods based diet, physical activity guidelines for adults, stress management and opportunities for social connections. I hope you find this information useful.   -----------------------------------------------------------------------------------------------------------------------------------------------------------------------------------------------------------------------------------------------------------  NUTRITION: -eat real food: lots of colorful vegetables (half the plate) and fruits -5-7 servings of vegetables and fruits per day (fresh or steamed is best), exp. 2 servings of vegetables with lunch and dinner and 2 servings of fruit per day. Berries and greens such as kale and collards are great choices.  -consume on a regular basis: whole grains (make sure first ingredient on label contains the  word "whole"), fresh fruits, fish, nuts, seeds, healthy oils (such as olive oil, avocado oil, grape seed oil) -may eat small amounts of dairy and lean meat on occasion, but avoid processed meats such as ham, bacon, lunch meat, etc. -drink water -try to avoid fast food and pre-packaged foods, processed meat -most experts advise limiting sodium to < 2300mg  per day, should limit further is any chronic conditions such as high blood pressure, heart disease, diabetes, etc. The American Heart Association advised that < 1500mg  is is ideal -try to avoid foods that contain any ingredients with names you do not recognize  -try to avoid sugar/sweets (except for the natural sugar that occurs in fresh fruit) -try to avoid sweet drinks -try to avoid white rice, white bread, pasta (unless whole grain), white or yellow potatoes  EXERCISE GUIDELINES FOR ADULTS: -if you wish to increase your physical activity, do so gradually and with the approval of your doctor -STOP and seek medical care immediately if you have any chest pain, chest discomfort or trouble breathing when starting or increasing exercise  -move and stretch your body, legs, feet and arms when sitting for long periods -Physical activity guidelines for optimal health in adults: -least 150 minutes per week of aerobic exercise (can talk, but not sing) once approved by your doctor, 20-30 minutes of sustained activity or two 10 minute episodes of sustained activity every day.  -resistance training at least 2 days per week if approved by your doctor -balance exercises 3+ days per week:   Stand somewhere where you have something sturdy to hold onto if you lose balance.    1) lift up on toes, start with 5x per day and work up to 20x   2) stand and lift on leg straight out to the side so that foot is a few inches of the floor, start with 5x each side and work up to 20x each side   3) stand on one foot, start with 5 seconds each side and work up to 20 seconds  on each side  If you need ideas or help with getting more active:  -Silver sneakers https://tools.silversneakers.com  -Walk with a Doc: http://www.duncan-williams.com/  -try to include resistance (weight lifting/strength building) and balance exercises twice per week: or the following link for ideas: http://castillo-powell.com/  BuyDucts.dk  STRESS MANAGEMENT: -can try meditating, or just sitting quietly with deep breathing while intentionally relaxing all parts of your body for 5 minutes daily -if you need further help with stress, anxiety or depression please follow up with your primary doctor or contact the  wonderful folks at [facility]: [phone]  SOCIAL CONNECTIONS: -options in [place] if you wish to engage in more social and exercise related activities:  -Silver sneakers https://tools.silversneakers.com  -Walk with a Doc: http://www.duncan-williams.com/  -Check out the [facility] Active Adults 50+ section on the [place] of [employer] (hiking clubs, book clubs, cards and games, chess, exercise classes, aquatic classes and much more) - see the website for details: https://www.[place].gov/departments/parks-recreation/active-adults50  -YouTube has lots of exercise videos for different ages and abilities as well  -[facility] (a variety of indoor and outdoor inperson activities for adults). 440-800-4127. [address].  -Virtual Online Classes (a variety of topics): see seniorplanet.org or call (608) 799-0579  -consider volunteering at a school, hospice center, church, senior center or elsewhere           Terressa Koyanagi, DO

## 2023-04-02 NOTE — Telephone Encounter (Signed)
Please advise 

## 2023-04-02 NOTE — Telephone Encounter (Signed)
*  Primary  Pharmacy Patient Advocate Encounter  Received notification from [facility] that Prior Authorization for Dicyclomine HCl 20MG  tablets  has been APPROVED from 04/02/2023 to 04/21/2023   PA #/Case ID/Reference #: [id number]

## 2023-04-07 ENCOUNTER — Other Ambulatory Visit: Payer: Self-pay | Admitting: Adult Health

## 2023-04-07 MED ORDER — PANCRELIPASE (LIP-PROT-AMYL) 36000-114000 UNITS PO CPEP: ORAL_CAPSULE | ORAL | 11 refills | Status: DC

## 2023-04-08 ENCOUNTER — Other Ambulatory Visit: Payer: Self-pay | Admitting: Adult Health

## 2023-04-08 DIAGNOSIS — K861 Other chronic pancreatitis: Secondary | ICD-10-CM

## 2023-04-09 ENCOUNTER — Telehealth: Payer: Self-pay

## 2023-04-09 ENCOUNTER — Other Ambulatory Visit: Payer: Self-pay | Admitting: Adult Health

## 2023-04-09 ENCOUNTER — Encounter: Payer: Self-pay | Admitting: Adult Health

## 2023-04-09 DIAGNOSIS — F419 Anxiety disorder, unspecified: Secondary | ICD-10-CM

## 2023-04-09 MED ORDER — ALPRAZOLAM 0.25 MG PO TABS
0.2500 mg | ORAL_TABLET | Freq: Two times a day (BID) | ORAL | 1 refills | Status: DC | PRN
Start: 2023-04-09 — End: 2023-08-05

## 2023-04-09 NOTE — Telephone Encounter (Signed)
Spoke with patient & he is aware Viviann Spare, RN will be contacting him on 04/20/23 to schedule 7 day hold appointment with Dr. Myrtie Neither for 04/27/23 at 2:20 pm.

## 2023-04-09 NOTE — Telephone Encounter (Signed)
Sherrilyn Rist, MD  Jose Cowboy, RN Jose Evans,  I received a message from a primary care provider with concerns about this patient who has a recent diagnosis of chronic pancreatitis and malabsorptive diarrhea.  He also had a positive Cologuard.  Former Dr. Christella Hartigan patient. He currently has a clinic appointment with [facility] in February, but needs to be seen sooner. If there are no cancellations or other openings for APP's or any physician within the next 2 weeks, then he can go on my clinic schedule at 2:20 PM on January 6.  That appointment could be offered to him when the 7-day-hold is lifted.  Please contact the patient and let him know we will work on it for him.  H Danis

## 2023-04-09 NOTE — Telephone Encounter (Signed)
Left message for patient to call back  

## 2023-04-10 ENCOUNTER — Other Ambulatory Visit: Payer: Self-pay | Admitting: Adult Health

## 2023-04-10 DIAGNOSIS — R197 Diarrhea, unspecified: Secondary | ICD-10-CM

## 2023-04-10 DIAGNOSIS — E039 Hypothyroidism, unspecified: Secondary | ICD-10-CM

## 2023-04-10 DIAGNOSIS — R109 Unspecified abdominal pain: Secondary | ICD-10-CM

## 2023-04-10 NOTE — Telephone Encounter (Signed)
Okay for refill?   Last TSH was abnormal

## 2023-04-10 NOTE — Telephone Encounter (Signed)
Noted  

## 2023-04-17 ENCOUNTER — Ambulatory Visit: Payer: Medicare HMO | Admitting: Adult Health

## 2023-04-20 ENCOUNTER — Other Ambulatory Visit (INDEPENDENT_AMBULATORY_CARE_PROVIDER_SITE_OTHER): Payer: Medicare HMO

## 2023-04-20 ENCOUNTER — Telehealth: Payer: Self-pay

## 2023-04-20 DIAGNOSIS — E039 Hypothyroidism, unspecified: Secondary | ICD-10-CM

## 2023-04-20 DIAGNOSIS — K861 Other chronic pancreatitis: Secondary | ICD-10-CM | POA: Diagnosis not present

## 2023-04-20 LAB — AMYLASE: Amylase: 31 U/L (ref 27–131)

## 2023-04-20 LAB — BASIC METABOLIC PANEL
BUN: 26 mg/dL — ABNORMAL HIGH (ref 6–23)
CO2: 25 meq/L (ref 19–32)
Calcium: 9.3 mg/dL (ref 8.4–10.5)
Chloride: 103 meq/L (ref 96–112)
Creatinine, Ser: 0.96 mg/dL (ref 0.40–1.50)
GFR: 77.32 mL/min (ref >60.00)
Glucose, Bld: 244 mg/dL — ABNORMAL HIGH (ref 70–99)
Potassium: 3.6 meq/L (ref 3.5–5.1)
Sodium: 136 meq/L (ref 135–145)

## 2023-04-20 LAB — LIPASE: Lipase: 14 U/L (ref 11.0–59.0)

## 2023-04-20 LAB — TSH: TSH: 0.13 u[IU]/mL — ABNORMAL LOW (ref 0.35–5.50)

## 2023-04-20 NOTE — Telephone Encounter (Signed)
Pt was scheduled for an office visit on 04/27/2023 at 2:20 PM with Dr. Myrtie Neither. Left message for pt to call back

## 2023-04-20 NOTE — Telephone Encounter (Signed)
-----   Message from Nurse Glendora Score C sent at 04/09/2023  2:22 PM EST ----- Schedule 7 day hold slot & confirm with patient. Needs OV 04/27/23 with Dr. Myrtie Neither.

## 2023-04-20 NOTE — Telephone Encounter (Signed)
PT returned call to confirm that he will take the 1/6 appointment with Dr. Myrtie Neither.

## 2023-04-21 ENCOUNTER — Other Ambulatory Visit: Payer: Self-pay | Admitting: Adult Health

## 2023-04-21 DIAGNOSIS — I10 Essential (primary) hypertension: Secondary | ICD-10-CM

## 2023-04-27 ENCOUNTER — Encounter: Payer: Self-pay | Admitting: Gastroenterology

## 2023-04-27 ENCOUNTER — Ambulatory Visit: Payer: Medicare HMO | Admitting: Gastroenterology

## 2023-04-27 VITALS — BP 100/60 | HR 64 | Ht 68.0 in | Wt 145.1 lb

## 2023-04-27 DIAGNOSIS — R195 Other fecal abnormalities: Secondary | ICD-10-CM | POA: Diagnosis not present

## 2023-04-27 DIAGNOSIS — K529 Noninfective gastroenteritis and colitis, unspecified: Secondary | ICD-10-CM | POA: Diagnosis not present

## 2023-04-27 DIAGNOSIS — R634 Abnormal weight loss: Secondary | ICD-10-CM

## 2023-04-27 DIAGNOSIS — K861 Other chronic pancreatitis: Secondary | ICD-10-CM | POA: Diagnosis not present

## 2023-04-27 NOTE — Patient Instructions (Addendum)
  Please give us  a call when you are able to schedule a colonoscopy  Zenpep - take one tablet with each meal. _______________________________________________________  If your blood pressure at your visit was 140/90 or greater, please contact your primary care physician to follow up on this.  _______________________________________________________  If you are age 76 or older, your body mass index should be between 23-30. Your Body mass index is 22.07 kg/m. If this is out of the aforementioned range listed, please consider follow up with your Primary Care Provider.  If you are age 53 or younger, your body mass index should be between 19-25. Your Body mass index is 22.07 kg/m. If this is out of the aformentioned range listed, please consider follow up with your Primary Care Provider.   ________________________________________________________  The [facility] providers would like to encourage you to use MYCHART to communicate with providers for non-urgent requests or questions.  Due to long hold times on the telephone, sending your provider a message by [facility] may be a faster and more efficient way to get a response.  Please allow 48 business hours for a response.  Please remember that this is for non-urgent requests.  _______________________________________________________ It was a pleasure to see you today!  Thank you for trusting me with your gastrointestinal care!

## 2023-04-27 NOTE — Progress Notes (Signed)
 [facility] Consult Note:  History: Jose Evans 04/27/2023  Referring provider: Merna Huxley, NP  Reason for consult/chief complaint: Diarrhea (Daily diarrhea for the last year, was having frequent BM's but better since starting dicyclomine ), Weight Loss (45 lbs in 1 year without trying), abnormal CT ( Cholelithiasis./ Chronic pancreatitis./ Sigmoid diverticulosis./), and Abdominal Cramping (Was having periumbilical cramping but better since starting dicyclomine )   Subjective  Prior history:  Colonoscopy scheduled with Dr. Rolan Cedar 05/27/2021 to evaluate a positive Cologuard, but the procedure was canceled because the patient was found to be in rapid atrial fibrillation in the preoperative area. He was recently referred to me by his primary care provider for persistent diarrhea, weight loss and CT scan findings suggesting chronic pancreatitis.  Pancreatic enzyme supplements were prescribed but unfortunately not covered by insurance.   Discussed the use of AI scribe software for clinical note transcription with the patient, who gave verbal consent to proceed.  History of Present Illness   The patient, diagnosed with type 2 diabetes within the past year, initially managed with Jardiance , glipizide , metformin , and Actos , reported intolerance to these medications, manifesting as severe stomach pain. Following discontinuation of these medications, the patient experienced persistent diarrhea and associated abdominal cramping. The patient also reported a history of thyroid  disease with fluctuating levels.  Diagnostic imaging revealed chronic pancreatitis and gallstones. The patient noted a significant improvement in bowel regularity after incorporating celery into their diet, although diarrhea persisted. The patient typically has one bowel movement per day, which is loose but without blood.  The patient reported abdominal pain only during bowel movements, in the form of  cramps. Dicyclomine  was prescribed, which the patient reported as helpful in managing the pain. The patient also reported difficulty in maintaining weight on a diabetic diet, necessitating the inclusion of carbohydrates.  The patient has a history of smoking, although they quit approximately 30 years ago. Alcohol use was minimal, with the patient reporting no more than one drink per week over the past 30 years. The patient also reported a significant weight loss of approximately 45 pounds over the past year.  The patient reported a history of atrial fibrillation, which has been asymptomatic for the past year and a half. The patient discontinued Xarelto  due to back pain. The patient also reported a history of taking Adderall, which they believe triggered their atrial fibrillation. The patient has since discontinued Adderall and has not experienced any further episodes of atrial fibrillation.  The patient reported a family history of Crohn's disease in a half-sibling, but no known family history of pancreas problems or celiac disease. The patient expressed concern about balancing the dietary requirements of their diabetes and pancreatitis, and the impact on their weight. The patient also expressed dissatisfaction with their current cardiologist and a desire to change providers.      ROS:  Review of Systems  Constitutional:  Positive for fatigue and unexpected weight change. Negative for appetite change.  HENT:  Negative for mouth sores and voice change.   Eyes:  Negative for pain and redness.  Respiratory:  Negative for cough and shortness of breath.   Cardiovascular:  Negative for chest pain and palpitations.  Genitourinary:  Negative for dysuria and hematuria.  Musculoskeletal:  Negative for arthralgias and myalgias.  Skin:  Negative for pallor and rash.  Neurological:  Negative for weakness and headaches.  Hematological:  Negative for adenopathy.     Past Medical History: Past Medical  History:  Diagnosis Date   Adult  acne    Cancer (HCC) 07/2017   skin cancer   Diabetes mellitus without complication (HCC)    Dyspnea    Gout    Hypertension    Hypothyroidism    Thyroid  disease    Saw [facility] November 18, 2022 - A fib stable   Past Surgical History: Past Surgical History:  Procedure Laterality Date   HERNIA REPAIR  2007   x2   PAROTIDECTOMY Left 10/07/2022   Procedure: LEFT PAROTIDECTOMY;  Surgeon: Carlie Clark, MD;  Location: [facility];  Service: ENT;  Laterality: Left;   SHOULDER SURGERY Right 2018   TONSILLECTOMY     age 11     Family History: Family History  Problem Relation Age of Onset   Hypertension Other    Colon cancer Neg Hx    Rectal cancer Neg Hx    Stomach cancer Neg Hx    Esophageal cancer Neg Hx     Social History: Social History   Socioeconomic History   Marital status: Married    Spouse name: Not on file   Number of children: Not on file   Years of education: Not on file   Highest education level: Some college, no degree  Occupational History   Not on file  Tobacco Use   Smoking status: Former    Current packs/day: 0.00    Average packs/day: 0.5 packs/day for 30.0 years (15.0 ttl pk-yrs)    Types: Cigarettes    Start date: 09/08/1975    Quit date: 09/07/2005    Years since quitting: 17.6   Smokeless tobacco: Never  Vaping Use   Vaping status: Never Used  Substance and Sexual Activity   Alcohol use: Yes    Comment: occasional   Drug use: No   Sexual activity: Not on file  Other Topics Concern   Not on file  Social History Narrative   Works for [employer]    Married   m   Social Drivers of Corporate Investment Banker Strain: Low Risk  (02/02/2023)   Overall Financial Resource Strain (CARDIA)    Difficulty of Paying Living Expenses: Not very hard  Food Insecurity: No Food Insecurity (02/02/2023)   Hunger Vital Sign    Worried About Running Out of Food in the Last Year: Never true     Ran Out of Food in the Last Year: Never true  Transportation Needs: No Transportation Needs (02/02/2023)   PRAPARE - Administrator, Civil Service (Medical): No    Lack of Transportation (Non-Medical): No  Physical Activity: Insufficiently Active (02/02/2023)   Exercise Vital Sign    Days of Exercise per Week: 2 days    Minutes of Exercise per Session: 60 min  Stress: Stress Concern Present (02/02/2023)   [employer] of Occupational Health - Occupational Stress Questionnaire    Feeling of Stress : To some extent  Social Connections: Moderately Isolated (02/02/2023)   Social Connection and Isolation Panel [NHANES]    Frequency of Communication with Friends and Family: More than three times a week    Frequency of Social Gatherings with Friends and Family: Twice a week    Attends Religious Services: Never    Database Administrator or Organizations: No    Attends Engineer, Structural: Not on file    Marital Status: Married    Allergies: Allergies  Allergen Reactions   Actos  [Pioglitazone ] Diarrhea   Glipizide      Abdominal pain  Glucophage  [Metformin ] Diarrhea   Jardiance  [Empagliflozin ]     Abdominal pain     Outpatient Meds: Current Outpatient Medications  Medication Sig Dispense Refill   acetaminophen  (TYLENOL ) 500 MG tablet Take 1,000 mg by mouth every 6 (six) hours as needed for mild pain.     allopurinol  (ZYLOPRIM ) 300 MG tablet TAKE 1 TABLET BY MOUTH EVERY DAY 90 tablet 3   ALPRAZolam  (XANAX ) 0.25 MG tablet Take 1 tablet (0.25 mg total) by mouth 2 (two) times daily as needed for anxiety. 20 tablet 1   Continuous Glucose Sensor (FREESTYLE LIBRE 3 SENSOR) MISC 1 Device by Does not apply route every 14 (fourteen) days. Place 1 sensor on the skin every 14 days. Use to check glucose continuously 6 each 6   dicyclomine  (BENTYL ) 20 MG tablet TAKE 1 TABLET (20 MG TOTAL) BY MOUTH 4 (FOUR) TIMES DAILY - BEFORE MEALS AND AT BEDTIME. 120 tablet 2    diltiazem  (CARDIZEM  SR) 120 MG 12 hr capsule Take 1 capsule (120 mg total) by mouth daily. 90 capsule 2   finasteride  (PROSCAR ) 5 MG tablet TAKE 1 TABLET (5 MG TOTAL) BY MOUTH DAILY. 90 tablet 3   insulin  glargine (LANTUS ) 100 UNIT/ML Solostar Pen Inject 10 Units into the skin daily. Inject 10 units under skin daily (Patient taking differently: Inject 15 Units into the skin daily. Inject 10 units under skin daily) 15 mL 11   Insulin  Pen Needle (PEN NEEDLES) 31G X 5 MM MISC 1 each by Does not apply route 3 (three) times daily with meals. May substitute to any manufacturer covered by patient's insurance. 100 each 3   levothyroxine  (SYNTHROID ) 175 MCG tablet TAKE 1 TABLET BY MOUTH EVERY DAY 30 tablet 0   lisinopril  (ZESTRIL ) 5 MG tablet TAKE 1 TABLET (5 MG TOTAL) BY MOUTH DAILY. 90 tablet 0   metoprolol  succinate (TOPROL -XL) 50 MG 24 hr tablet TAKE 1 TABLET BY MOUTH EVERY DAY 90 tablet 3   traMADol  (ULTRAM ) 50 MG tablet Take 1 tablet (50 mg total) by mouth every 8 (eight) hours as needed. 60 tablet 2   traZODone  (DESYREL ) 50 MG tablet TAKE 1 TABLET BY MOUTH EVERY DAY AT BEDTIME AS NEEDED FOR SLEEP 90 tablet 1   triamcinolone  lotion (KENALOG ) 0.1 % Apply 1 Application topically 2 (two) times daily as needed. 60 mL 2   triamterene -hydrochlorothiazide (MAXZIDE) 75-50 MG tablet TAKE 1 TABLET BY MOUTH EVERY DAY 90 tablet 1   No current facility-administered medications for this visit.   He report stopping Xarelto  within the last year.  He had not discussed this with his cardiologist but says his primary care provider is aware.  Koren also expressed dissatisfaction with the last visit that he had at the cardiology clinic and is thinking about requesting a transfer to another provider.   ___________________________________________________________________ Objective   Exam:  BP 100/60 (BP Location: Left Arm, Patient Position: Sitting, Cuff Size: Normal)   Pulse 64   Ht 5' 8 (1.727 m)   Wt 145 lb 2 oz  (65.8 kg)   BMI 22.07 kg/m  Wt Readings from Last 3 Encounters:  04/27/23 145 lb 2 oz (65.8 kg)  03/17/23 151 lb (68.5 kg)  03/11/23 156 lb (70.8 kg)   Note significant weight decrease in recent months above General: Thin.  Not acutely ill-appearing.  Normal vocal quality Eyes: sclera anicteric, no redness ENT: oral mucosa moist without lesions, no cervical or supraclavicular lymphadenopathy CV: Regular without appreciable murmur, no JVD, no peripheral edema  Resp: clear to auscultation bilaterally, normal RR and effort noted GI: soft, no tenderness, with active bowel sounds. No guarding or palpable organomegaly noted. Skin; warm and dry, no rash or jaundice noted Neuro: awake, alert and oriented x 3. Normal gross motor function and fluent speech   Labs:     Latest Ref Rng & Units 03/17/2023    4:30 PM 10/15/2021   11:00 AM 10/03/2021    2:11 PM  CBC  WBC 4.0 - 10.5 K/uL 8.8  11.9  11.3   Hemoglobin 13.0 - 17.0 g/dL 86.5  85.4  85.2   Hematocrit 39.0 - 52.0 % 40.4  42.8  43.3   Platelets 150.0 - 400.0 K/uL 292.0  316  312.0       Latest Ref Rng & Units 04/20/2023    1:35 PM 03/17/2023    4:30 PM 10/24/2022    9:28 AM  CMP  Glucose 70 - 99 mg/dL 755  890  651   BUN 6 - 23 mg/dL 26  21  30    Creatinine 0.40 - 1.50 mg/dL 9.03  8.93  8.78   Sodium 135 - 145 mEq/L 136  137  133   Potassium 3.5 - 5.1 mEq/L 3.6  3.3  3.5   Chloride 96 - 112 mEq/L 103  101  97   CO2 19 - 32 mEq/L 25  28  25    Calcium 8.4 - 10.5 mg/dL 9.3  9.5  9.7   Total Protein 6.0 - 8.3 g/dL  6.5    Total Bilirubin 0.2 - 1.2 mg/dL  0.5    Alkaline Phos 39 - 117 U/L  67    AST 0 - 37 U/L  33    ALT 0 - 53 U/L  54     TSH was elevated to 10 on 03/17/2023 TSH was then low at 0.13 on 04/20/2023 Hgb A1c = 6.8 in Sept 2024  Radiologic Studies:  CLINICAL DATA:  Weight loss, acute abdominal pain, cramping   EXAM: CT ABDOMEN AND PELVIS WITHOUT CONTRAST   TECHNIQUE: Multidetector CT imaging of the abdomen  and pelvis was performed following the standard protocol without IV contrast.   RADIATION DOSE REDUCTION: This exam was performed according to the departmental dose-optimization program which includes automated exposure control, adjustment of the mA and/or kV according to patient size and/or use of iterative reconstruction technique.   COMPARISON:  None Available.   FINDINGS: Lower chest: No pleural or pericardial effusion. Visualized lung bases clear.   Hepatobiliary: A few scattered small well-demarcated low-attenuation lesions largest 11 mm in the inferior right lobe, likely cysts in the absence of a history of primary carcinoma. Subcentimeter hyperdensity in the dependent aspect of the nondilated gallbladder suggesting cholelithiasis. No biliary ductal dilatation.   Pancreas: Extensive coarse calcifications suggesting chronic pancreatitis. No mass or regional inflammatory change.   Spleen: Normal in size without focal abnormality.   Adrenals/Urinary Tract: No adrenal mass. No hydronephrosis. No urolithiasis. Multiple cortical lesions in both kidneys, some of which can be characterized as cysts, largest 3.2 cm 2 HU left lower pole; no followup recommended. Urinary bladder incompletely distended.   Stomach/Bowel: Stomach is distended by ingested material without acute finding. Small bowel decompressed with incomplete distal passage of oral contrast material. Normal appendix. The colon is partially distended, with scattered distal descending and multiple sigmoid diverticula; no adjacent inflammatory change.   Vascular/Lymphatic: Mild scattered aortoiliac calcified plaque without AAA. No abdominal or pelvic adenopathy.   Reproductive: Mild prostate enlargement  Other: No ascites.  No free air.   Musculoskeletal: Lumbar spondylitic changes most marked L3-S1 with grade 1 anterolisthesis L4-5 presumably related to facet DJD; no pars defect.   IMPRESSION: 1. No acute  findings. 2. Cholelithiasis. 3. Chronic pancreatitis. 4. Sigmoid diverticulosis. 5.  Aortic Atherosclerosis (ICD10-I70.0).     Electronically Signed   By: JONETTA Faes M.D.   On: 04/04/2023 12:39 _____________________  December 2022 Echo    1. Left ventricular ejection fraction, by estimation, is 60 to 65%. The  left ventricle has normal function. The left ventricle has no regional  wall motion abnormalities. Left ventricular diastolic parameters are  indeterminate. Elevated left ventricular  end-diastolic pressure. The E/e' is 17.8.   2. Right ventricular systolic function is normal. The right ventricular  size is normal. There is normal pulmonary artery systolic pressure. The  estimated right ventricular systolic pressure is 17.0 mmHg.   3. The mitral valve is grossly normal. Trivial mitral valve  regurgitation. No evidence of mitral stenosis.   4. The aortic valve is tricuspid. Aortic valve regurgitation is not  visualized. No aortic stenosis is present.   5. The inferior vena cava is normal in size with greater than 50%  respiratory variability, suggesting right atrial pressure of 3 mmHg.    Encounter Diagnoses  Name Primary?   Chronic diarrhea Yes   Weight loss    Idiopathic chronic pancreatitis (HCC)    Positive colorectal cancer screening using Cologuard test    Chronic pancreatitis appears to explain his weight loss and diarrhea, though the cause is unclear.  Could be related to distant smoking history, he has never had significant alcohol intake by report.  No family history of pancreatitis and he is not known to have had any episodes of acute pancreatitis. Assessment and Plan    Chronic Pancreatitis Diagnosed on CT scan. Symptoms include diarrhea, cramping, and significant weight loss. Possible contributing factors include distant history of smoking and potential medication side effects. -Start pancreatic enzyme supplements, initially one capsule with each meal so  he we will have enough to make the samples last longer.  This will be 40,000 units per meal rather than the usual dose of at least 60,000 units of lipase. -Explore programs for reduced-cost medication.  Our staff will work on that for him. Daily multivitamin would also be beneficial for him.  Type 2 Diabetes Difficulty maintaining weight on a diabetic diet due to pancreatitis symptoms. -Continue current management, balancing dietary needs of both conditions.  Positive Cologuard Test Positive test in the past, but colonoscopy was not completed due to atrial fibrillation. -Schedule colonoscopy to investigate for potential polyps and prevent colon cancer.  The benefits and risks of the planned procedure were described in detail with the patient or (when appropriate) their health care proxy.  Risks were outlined as including, but not limited to, bleeding, infection, perforation, adverse medication reaction leading to cardiac or pulmonary decompensation, pancreatitis (if ERCP).  The limitation of incomplete mucosal visualization was also discussed.  No guarantees or warranties were given.   Atrial Fibrillation No recent episodes, but previously on Xarelto . -Continue current management.  Gallstones Incidental finding on CT scan, likely not contributing to current symptoms. -No specific plan needed at this time.     Thank you for the courtesy of this consult.  Please call me with any questions or concerns.  Victory LITTIE Brand III  CC: Referring provider noted above

## 2023-04-28 ENCOUNTER — Encounter: Payer: Self-pay | Admitting: Gastroenterology

## 2023-04-29 ENCOUNTER — Other Ambulatory Visit: Payer: Self-pay | Admitting: Adult Health

## 2023-04-29 DIAGNOSIS — E039 Hypothyroidism, unspecified: Secondary | ICD-10-CM

## 2023-04-29 MED ORDER — LEVOTHYROXINE SODIUM 150 MCG PO TABS
150.0000 ug | ORAL_TABLET | Freq: Every day | ORAL | 0 refills | Status: DC
Start: 2023-04-29 — End: 2023-09-08

## 2023-05-07 ENCOUNTER — Other Ambulatory Visit: Payer: Self-pay | Admitting: Adult Health

## 2023-05-07 DIAGNOSIS — N401 Enlarged prostate with lower urinary tract symptoms: Secondary | ICD-10-CM

## 2023-05-12 ENCOUNTER — Encounter: Payer: Medicare HMO | Attending: Adult Health | Admitting: Nutrition

## 2023-05-12 ENCOUNTER — Encounter: Payer: Self-pay | Admitting: Nutrition

## 2023-05-12 VITALS — Ht 68.5 in | Wt 147.6 lb

## 2023-05-12 DIAGNOSIS — I10 Essential (primary) hypertension: Secondary | ICD-10-CM

## 2023-05-12 DIAGNOSIS — K8500 Idiopathic acute pancreatitis without necrosis or infection: Secondary | ICD-10-CM | POA: Diagnosis not present

## 2023-05-12 DIAGNOSIS — E118 Type 2 diabetes mellitus with unspecified complications: Secondary | ICD-10-CM | POA: Diagnosis not present

## 2023-05-12 NOTE — Patient Instructions (Signed)
  Call PCP and request referral to Endocrinology. Ask GI MD for more samples of Zenpep Avoid high fat foods Increase plant protein of dried beans, peas, lentils Talk to PCP and increase Lantus to 18 units or 2 units every 3 days til your fasting blood sugar in below 150 mg/dl.  Ask DM for referral for Libre 3+ since the [place] 3 is going out of stock. Eat small frequent meals. You will need pancreas enzymes to be taken with meals and snacks for you to be able to absorb the nutrients. Go to [employer] website to see about financial assistance

## 2023-05-12 NOTE — Progress Notes (Signed)
Medical Nutrition Therapy  Appointment Start time:  1000  Appointment End time:  1045  Primary concerns today: DM Type 2  Referral diagnosis: E11.8 Preferred learning style: no preference) Learning readiness: Ready    NUTRITION ASSESSMENT Followup Dm Type 2 78 yr old wmale referred for recent diagnosis of Type 2 DM. PCP Shirline Frees, NP. Sees Dr. Myrtie Neither for GI. Is on Zenpep samples due to pancreatitis.  Has a Libre 3. Since being on Zenpep, he noticed his BS have been higher. TIR 19% TAR 33%, Very High 48%. Avg Glucose 246 mg/dl.  He will need a prescription for the LIbre 3+ since they are not making the 2-3 LIbre's anymore.   Couldn't tolerated metformin, jardiance, actos and glipizide were not toleated with GI issues. Chronic diarrhea Lowest weight went to 139 lbs Struggels with trying to find the right foods that will work with this diabetes but not cause stomach pain or more diarrhea.  Takes  15 units of lantus at night.  He is taking imodium to help with diarrhea and he notes it has some. Stools are still loose, fatty/greasy looking indicating signs of malabsorption. He is only taking half the dose of Zenpep to allow his samples to stretch. He can not afford the cost of them through his insurance. Still has stomach issues.  He has lost about 10 lbs since our last visit. Has thyroid  and pancreatitis issues and is not followed by Endocrinology. He would like to see a Endocrinology to address his thyroid and pancreas issues.  Currently taking 15 units of Lantus per day. [place]owerver, according to his Josephine Igo, he needs an increase in Lantus to get his FBS below 150. He was advised to take insulin in am instead of night time by his PCP.  BS are not controlled at this time. He will need increase doses of his insulin for better management.  He will need to avoid fat and fatty foods due to pancreatitis. Needs more protein from plant based foods- beans, peas, lentils and betrer balanced  meals of carbohydrates and protein with all meals.[name]  He is at high risk for protein calorie malnutrition duet to chronic pancreatitis and uncontrolled blood sugars and on going weight loss.  Referred him to the [employer] website to see about financial assistance for pancreatic lipase.  Wt Readings from Last 3 Encounters:  05/12/23 147 lb 9.6 oz (67 kg)  04/27/23 145 lb 2 oz (65.8 kg)  03/17/23 151 lb (68.5 kg)   Ht Readings from Last 3 Encounters:  05/12/23 5' 8.5" (1.74 m)  04/27/23 5\' 8"  (1.727 m)  03/17/23 5' 9.5" (1.765 m)   Body mass index is 22.12 kg/m. @BMIFA @ Facility age limit for growth %iles is 20 years. Facility age limit for growth %iles is 20 years.   Clinical Medical Hx:  Past Medical History:  Diagnosis Date   Adult acne    Cancer (HCC) 07/2017   skin cancer   Diabetes mellitus without complication (HCC)    Dyspnea    Gout    Hypertension    Hypothyroidism    Thyroid disease    Lab Results  Component Value Date   HGBA1C 6.8 (A) 01/16/2023      Latest Ref Rng & Units 04/20/2023    1:35 PM 03/17/2023    4:30 PM 10/24/2022    9:28 AM  CMP  Glucose 70 - 99 mg/dL 191  478  295   BUN 6 - 23 mg/dL 26  21  30  Creatinine 0.40 - 1.50 mg/dL 0.34  7.42  5.95   Sodium 135 - 145 mEq/L 136  137  133   Potassium 3.5 - 5.1 mEq/L 3.6  3.3  3.5   Chloride 96 - 112 mEq/L 103  101  97   CO2 19 - 32 mEq/L 25  28  25    Calcium 8.4 - 10.5 mg/dL 9.3  9.5  9.7   Total Protein 6.0 - 8.3 g/dL  6.5    Total Bilirubin 0.2 - 1.2 mg/dL  0.5    Alkaline Phos 39 - 117 U/L  67    AST 0 - 37 U/L  33    ALT 0 - 53 U/L  54     Lipid Panel     Component Value Date/Time   CHOL 155 02/28/2021 0833   TRIG 138.0 02/28/2021 0833   HDL 39.20 02/28/2021 0833   CHOLHDL 4 02/28/2021 0833   VLDL 27.6 02/28/2021 0833   LDLCALC 88 02/28/2021 0833   LDLCALC 122 (H) 01/26/2020 1338     Medications:  Current Outpatient Medications on File Prior to Visit  Medication Sig Dispense  Refill   acetaminophen (TYLENOL) 500 MG tablet Take 1,000 mg by mouth every 6 (six) hours as needed for mild pain.     allopurinol (ZYLOPRIM) 300 MG tablet TAKE 1 TABLET BY MOUTH EVERY DAY 90 tablet 3   ALPRAZolam (XANAX) 0.25 MG tablet Take 1 tablet (0.25 mg total) by mouth 2 (two) times daily as needed for anxiety. 20 tablet 1   Continuous Glucose Sensor (FREESTYLE LIBRE 3 SENSOR) MISC 1 Device by Does not apply route every 14 (fourteen) days. Place 1 sensor on the skin every 14 days. Use to check glucose continuously 6 each 6   dicyclomine (BENTYL) 20 MG tablet TAKE 1 TABLET (20 MG TOTAL) BY MOUTH 4 (FOUR) TIMES DAILY - BEFORE MEALS AND AT BEDTIME. 120 tablet 2   diltiazem (CARDIZEM SR) 120 MG 12 hr capsule Take 1 capsule (120 mg total) by mouth daily. 90 capsule 2   finasteride (PROSCAR) 5 MG tablet TAKE 1 TABLET (5 MG TOTAL) BY MOUTH DAILY. 90 tablet 3   insulin glargine (LANTUS) 100 UNIT/ML Solostar Pen Inject 10 Units into the skin daily. Inject 10 units under skin daily (Patient taking differently: Inject 15 Units into the skin daily. Inject 10 units under skin daily) 15 mL 11   Insulin Pen Needle (PEN NEEDLES) 31G X 5 MM MISC 1 each by Does not apply route 3 (three) times daily with meals. May substitute to any manufacturer covered by patient's insurance. 100 each 3   levothyroxine (SYNTHROID) 150 MCG tablet Take 1 tablet (150 mcg total) by mouth daily. 30 tablet 0   lisinopril (ZESTRIL) 5 MG tablet TAKE 1 TABLET (5 MG TOTAL) BY MOUTH DAILY. 90 tablet 0   metoprolol succinate (TOPROL-XL) 50 MG 24 hr tablet TAKE 1 TABLET BY MOUTH EVERY DAY 90 tablet 3   traMADol (ULTRAM) 50 MG tablet Take 1 tablet (50 mg total) by mouth every 8 (eight) hours as needed. 60 tablet 2   traZODone (DESYREL) 50 MG tablet TAKE 1 TABLET BY MOUTH EVERY DAY AT BEDTIME AS NEEDED FOR SLEEP 90 tablet 1   triamcinolone lotion (KENALOG) 0.1 % Apply 1 Application topically 2 (two) times daily as needed. 60 mL 2    triamterene-hydrochlorothiazide (MAXZIDE) 75-50 MG tablet TAKE 1 TABLET BY MOUTH EVERY DAY 90 tablet 1   No current facility-administered medications on file  prior to visit.     Labs:  Lab Results  Component Value Date   HGBA1C 6.8 (A) 01/16/2023      Latest Ref Rng & Units 04/20/2023    1:35 PM 03/17/2023    4:30 PM 10/24/2022    9:28 AM  CMP  Glucose 70 - 99 mg/dL 413  244  010   BUN 6 - 23 mg/dL 26  21  30    Creatinine 0.40 - 1.50 mg/dL 2.72  5.36  6.44   Sodium 135 - 145 mEq/L 136  137  133   Potassium 3.5 - 5.1 mEq/L 3.6  3.3  3.5   Chloride 96 - 112 mEq/L 103  101  97   CO2 19 - 32 mEq/L 25  28  25    Calcium 8.4 - 10.5 mg/dL 9.3  9.5  9.7   Total Protein 6.0 - 8.3 g/dL  6.5    Total Bilirubin 0.2 - 1.2 mg/dL  0.5    Alkaline Phos 39 - 117 U/L  67    AST 0 - 37 U/L  33    ALT 0 - 53 U/L  54     Lipid Panel     Component Value Date/Time   CHOL 155 02/28/2021 0833   TRIG 138.0 02/28/2021 0833   HDL 39.20 02/28/2021 0833   CHOLHDL 4 02/28/2021 0833   VLDL 27.6 02/28/2021 0833   LDLCALC 88 02/28/2021 0833   LDLCALC 122 (H) 01/26/2020 1338    Notable Signs/Symptoms: Weakness at times.  Lifestyle & Dietary Hx Lives with wife. Eats 3 meals per day He cooks mostly. Only eats out 1 per week.  Estimated daily fluid intake: 60 oz Supplements: MG, Potassium Sleep: poor- only gets 3.-4 hrs at night. Can't sleep long. Has been a problem his whole life Stress / self-care:  Current average weekly physical activity: Active outside  24-Hr Dietary Recall B)1/2 cup of berries and banana 1/2 bagel with cream cheese and salmon 3-4 oz L) taco salad, unsweet tea or water D) poach shrimp with feta cheese, rice white, water A few non fat fig newtons   Estimated Energy Needs Calories: 2000 Carbohydrate: 225g Protein: 150g Fat: 56g   NUTRITION DIAGNOSIS  NB-1.1 Food and nutrition-related knowledge deficit As related to DIabetes.  As evidenced by A1C  10.2%.[name]   NUTRITION INTERVENTION  Nutrition education (E-1) on the following topics:  Pancreatitis Nutrition Therapy       Lower fat diet       DM impact and need for increased insulin Benefits of seeing an Endocrinologist.for Dm and Pancrea    Lifestyle Medicine  - Whole Food, Plant Predominant Nutrition is highly recommended: Eat Plenty of vegetables, Mushrooms, fruits, Legumes, Whole Grains, Nuts, seeds in lieu of processed meats, processed snacks/pastries red meat, poultry, eggs.    -It is better to avoid simple carbohydrates including: Cakes, Sweet Desserts, Ice Cream, Soda (diet and regular), Sweet Tea, Candies, Chips, Cookies, Store Bought Juices, Alcohol in Excess of  1-2 drinks a day, Lemonade,  Artificial Sweeteners, Doughnuts, Coffee Creamers, "Sugar-free" Products, etc, etc.  This is not a complete list.....  Exercise: If you are able: 30 -60 minutes a day ,4 days a week, or 150 minutes a week.  The longer the better.  Combine stretch, strength, and aerobic activities.  If you were told in the past that you have high risk for cardiovascular diseases, you may seek evaluation by your heart doctor prior to initiating moderate to intense exercise  programs.   Handouts Provided Include  Know your numbers Lifestyle Medicine  Learning Style & Readiness for Change Teaching method utilized: Visual & Auditory  Demonstrated degree of understanding via: Teach Back  Barriers to learning/adherence to lifestyle change: none  Goals Established by Pt Goals  Call PCP and request referral to Endocrinology. Ask GI MD for more samples of Zenpep Avoid high fat foods Increase plant protein of dried beans, peas, lentils Talk to PCP and increase Lantus to 18 units or 2 units every 3 days til your fasting blood sugar in below 150 mg/dl.  Ask DM for referral for Libre 3+ since the [place] 3 is going out of stock. Eat small frequent meals. You will need pancreas enzymes to be taken with meals  and snacks for you to be able to absorb the nutrients. Go to [employer] website to see about financial assistance.   MONITORING & EVALUATION Dietary intake, weekly physical activity, and BS  in 1  months.  Needs fiancial assistance with Creon or similar product. Recommend referral to Endocrinology to assist with his thyroid and pancreas issues for better management of his DM.  Next Steps  Patient is to work on meal planning and eating more consistent whole plant based foods.[name]

## 2023-05-13 ENCOUNTER — Encounter: Payer: Self-pay | Admitting: Adult Health

## 2023-05-13 DIAGNOSIS — Z794 Long term (current) use of insulin: Secondary | ICD-10-CM

## 2023-05-13 NOTE — Telephone Encounter (Signed)
Please advise 

## 2023-05-20 ENCOUNTER — Ambulatory Visit (AMBULATORY_SURGERY_CENTER): Payer: Medicare HMO

## 2023-05-20 VITALS — Ht 68.5 in | Wt 141.0 lb

## 2023-05-20 DIAGNOSIS — K861 Other chronic pancreatitis: Secondary | ICD-10-CM

## 2023-05-20 DIAGNOSIS — R195 Other fecal abnormalities: Secondary | ICD-10-CM

## 2023-05-20 DIAGNOSIS — K529 Noninfective gastroenteritis and colitis, unspecified: Secondary | ICD-10-CM

## 2023-05-20 MED ORDER — SUFLAVE 178.7 G PO SOLR: 1.0000 | Freq: Once | ORAL | 0 refills | Status: AC

## 2023-05-20 NOTE — Progress Notes (Signed)
No egg or soy allergy known to patient  No issues known to pt with past sedation with any surgeries or procedures Patient denies ever being told they had issues or difficulty with intubation  No FH of Malignant Hyperthermia Pt is not on diet pills Pt is not on  home 02  Pt denies issues with constipation  A fib or A flutter diagnosed 2 years ago, but has had no symptoms since then; per patient, BP medications have maintained NSR Pt is not on blood thinners per Cardiologist MD  Have any cardiac testing pending--no Pt states he is not taking GLP medications

## 2023-06-02 ENCOUNTER — Ambulatory Visit: Payer: Medicare HMO | Admitting: Gastroenterology

## 2023-06-07 ENCOUNTER — Other Ambulatory Visit: Payer: Self-pay | Admitting: Adult Health

## 2023-06-07 DIAGNOSIS — I10 Essential (primary) hypertension: Secondary | ICD-10-CM

## 2023-06-08 ENCOUNTER — Encounter: Payer: Medicare HMO | Admitting: Gastroenterology

## 2023-06-08 NOTE — Patient Instructions (Incomplete)
Diabetes Mellitus and Nutrition, Adult When you have diabetes, or diabetes mellitus, it is very important to have healthy eating habits because your blood sugar (glucose) levels are greatly affected by what you eat and drink. Eating healthy foods in the right amounts, at about the same times every day, can help you: Manage your blood glucose. Lower your risk of heart disease. Improve your blood pressure. Reach or maintain a healthy weight. What can affect my meal plan? Every person with diabetes is different, and each person has different needs for a meal plan. Your health care provider may recommend that you work with a dietitian to make a meal plan that is best for you. Your meal plan may vary depending on factors such as: The calories you need. The medicines you take. Your weight. Your blood glucose, blood pressure, and cholesterol levels. Your activity level. Other health conditions you have, such as heart or kidney disease. How do carbohydrates affect me? Carbohydrates, also called carbs, affect your blood glucose level more than any other type of food. Eating carbs raises the amount of glucose in your blood. It is important to know how many carbs you can safely have in each meal. This is different for every person. Your dietitian can help you calculate how many carbs you should have at each meal and for each snack. How does alcohol affect me? Alcohol can cause a decrease in blood glucose (hypoglycemia), especially if you use insulin or take certain diabetes medicines by mouth. Hypoglycemia can be a life-threatening condition. Symptoms of hypoglycemia, such as sleepiness, dizziness, and confusion, are similar to symptoms of having too much alcohol. Do not drink alcohol if: Your health care provider tells you not to drink. You are pregnant, may be pregnant, or are planning to become pregnant. If you drink alcohol: Limit how much you have to: 0-1 drink a day for women. 0-2 drinks a day  for men. Know how much alcohol is in your drink. In the [place] one drink equals one 12 oz bottle of beer (355 mL), one 5 oz glass of wine (148 mL), or one 1 oz glass of hard liquor (44 mL). Keep yourself hydrated with water, diet soda, or unsweetened iced tea. Keep in mind that regular soda, juice, and other mixers may contain a lot of sugar and must be counted as carbs. What are tips for following this plan?  Reading food labels Start by checking the serving size on the Nutrition Facts label of packaged foods and drinks. The number of calories and the amount of carbs, fats, and other nutrients listed on the label are based on one serving of the item. Many items contain more than one serving per package. Check the total grams (g) of carbs in one serving. Check the number of grams of saturated fats and trans fats in one serving. Choose foods that have a low amount or none of these fats. Check the number of milligrams (mg) of salt (sodium) in one serving. Most people should limit total sodium intake to less than 2,300 mg per day. Always check the nutrition information of foods labeled as "low-fat" or "nonfat." These foods may be higher in added sugar or refined carbs and should be avoided. Talk to your dietitian to identify your daily goals for nutrients listed on the label. Shopping Avoid buying canned, pre-made, or processed foods. These foods tend to be high in fat, sodium, and added sugar. Shop around the outside edge of the grocery store. This is where you  will most often find fresh fruits and vegetables, bulk grains, fresh meats, and fresh dairy products. Cooking Use low-heat cooking methods, such as baking, instead of high-heat cooking methods, such as deep frying. Cook using healthy oils, such as olive, canola, or sunflower oil. Avoid cooking with butter, cream, or high-fat meats. Meal planning Eat meals and snacks regularly, preferably at the same times every day. Avoid going long periods  of time without eating. Eat foods that are high in fiber, such as fresh fruits, vegetables, beans, and whole grains. Eat 4-6 oz (112-168 g) of lean protein each day, such as lean meat, chicken, fish, eggs, or tofu. One ounce (oz) (28 g) of lean protein is equal to: 1 oz (28 g) of meat, chicken, or fish. 1 egg.  cup (62 g) of tofu. Eat some foods each day that contain healthy fats, such as avocado, nuts, seeds, and fish. What foods should I eat? Fruits Berries. Apples. Oranges. Peaches. Apricots. Plums. Grapes. Mangoes. Papayas. Pomegranates. Kiwi. Cherries. Vegetables Leafy greens, including lettuce, spinach, kale, chard, collard greens, mustard greens, and cabbage. Beets. Cauliflower. Broccoli. Carrots. Green beans. Tomatoes. Peppers. Onions. Cucumbers. Brussels sprouts. Grains Whole grains, such as whole-wheat or whole-grain bread, crackers, tortillas, cereal, and pasta. Unsweetened oatmeal. Quinoa. Brown or wild rice. Meats and other proteins Seafood. Poultry without skin. Lean cuts of poultry and beef. Tofu. Nuts. Seeds. Dairy Low-fat or fat-free dairy products such as milk, yogurt, and cheese. The items listed above may not be a complete list of foods and beverages you can eat and drink. Contact a dietitian for more information. What foods should I avoid? Fruits Fruits canned with syrup. Vegetables Canned vegetables. Frozen vegetables with butter or cream sauce. Grains Refined white flour and flour products such as bread, pasta, snack foods, and cereals. Avoid all processed foods. Meats and other proteins Fatty cuts of meat. Poultry with skin. Breaded or fried meats. Processed meat. Avoid saturated fats. Dairy Full-fat yogurt, cheese, or milk. Beverages Sweetened drinks, such as soda or iced tea. The items listed above may not be a complete list of foods and beverages you should avoid. Contact a dietitian for more information. Questions to ask a health care provider Do I need  to meet with a certified diabetes care and education specialist? Do I need to meet with a dietitian? What number can I call if I have questions? When are the best times to check my blood glucose? Where to find more information: American Diabetes Association: diabetes.org Academy of Nutrition and Dietetics: eatright.[employer] of Diabetes and Digestive and Kidney Diseases: StageSync.si Association of Diabetes Care & Education Specialists: diabeteseducator.org Summary It is important to have healthy eating habits because your blood sugar (glucose) levels are greatly affected by what you eat and drink. It is important to use alcohol carefully. A healthy meal plan will help you manage your blood glucose and lower your risk of heart disease. Your health care provider may recommend that you work with a dietitian to make a meal plan that is best for you. This information is not intended to replace advice given to you by your health care provider. Make sure you discuss any questions you have with your health care provider. Document Revised: 11/08/2019 Document Reviewed: 11/09/2019 Elsevier Patient Education  2024 Elsevier Inc.      - The correct intake of thyroid hormone (Levothyroxine, Synthroid), is on empty stomach first thing in the morning, with water, separated by at least 30 minutes from breakfast and other medications,  and separated by more than 4 hours from calcium, iron, multivitamins, acid reflux medications (PPIs).  - This medication is a life-long medication and will be needed to correct thyroid hormone imbalances for the rest of your life.  The dose may change from time to time, based on thyroid blood work.  - It is extremely important to be consistent taking this medication, near the same time each morning.  -AVOID TAKING PRODUCTS CONTAINING BIOTIN (commonly found in Hair, Skin, Nails vitamins) AS IT INTERFERES WITH THE VALIDITY OF THYROID FUNCTION BLOOD TESTS.

## 2023-06-09 ENCOUNTER — Encounter: Payer: Self-pay | Admitting: Adult Health

## 2023-06-09 ENCOUNTER — Ambulatory Visit: Payer: Medicare HMO | Admitting: Nurse Practitioner

## 2023-06-09 ENCOUNTER — Encounter: Payer: Self-pay | Admitting: Nurse Practitioner

## 2023-06-09 VITALS — BP 112/72 | HR 60 | Ht 69.0 in | Wt 146.8 lb

## 2023-06-09 DIAGNOSIS — Z794 Long term (current) use of insulin: Secondary | ICD-10-CM

## 2023-06-09 DIAGNOSIS — E1165 Type 2 diabetes mellitus with hyperglycemia: Secondary | ICD-10-CM

## 2023-06-09 DIAGNOSIS — E119 Type 2 diabetes mellitus without complications: Secondary | ICD-10-CM

## 2023-06-09 DIAGNOSIS — E039 Hypothyroidism, unspecified: Secondary | ICD-10-CM

## 2023-06-09 DIAGNOSIS — Z7984 Long term (current) use of oral hypoglycemic drugs: Secondary | ICD-10-CM | POA: Diagnosis not present

## 2023-06-09 DIAGNOSIS — I10 Essential (primary) hypertension: Secondary | ICD-10-CM

## 2023-06-09 DIAGNOSIS — E890 Postprocedural hypothyroidism: Secondary | ICD-10-CM | POA: Diagnosis not present

## 2023-06-09 LAB — POCT GLYCOSYLATED HEMOGLOBIN (HGB A1C): Hemoglobin A1C: 7.9 % — AB (ref 4.0–5.6)

## 2023-06-09 MED ORDER — INSULIN GLARGINE 100 UNIT/ML SOLOSTAR PEN: 16.0000 [IU] | PEN_INJECTOR | Freq: Every day | SUBCUTANEOUS | Status: DC

## 2023-06-09 MED ORDER — TRIAMTERENE-HCTZ 75-50 MG PO TABS: 1.0000 | ORAL_TABLET | Freq: Every day | ORAL | 1 refills | Status: DC

## 2023-06-09 MED ORDER — FIASP FLEXTOUCH 100 UNIT/ML ~~LOC~~ SOPN
4.0000 [IU] | PEN_INJECTOR | Freq: Three times a day (TID) | SUBCUTANEOUS | 3 refills | Status: DC
Start: 2023-06-09 — End: 2023-09-08

## 2023-06-09 NOTE — Progress Notes (Signed)
Endocrinology Consult Note       06/09/2023, 12:04 PM   Subjective:    Patient ID: Jose Evans, male    DOB: 1945/05/22.  Vijay Durflinger is being seen in consultation for management of currently uncontrolled symptomatic diabetes requested by  Shirline Frees, NP.   Past Medical History:  Diagnosis Date   Adult acne    Cancer (HCC) 07/2017   skin cancer   Chronic Pancreatitis 01/2023   Diabetes mellitus without complication (HCC)    Dyspnea    Gout    Hypertension    Hypothyroidism    Thyroid disease     Past Surgical History:  Procedure Laterality Date   HERNIA REPAIR  2007   x2   PAROTIDECTOMY Left 10/07/2022   Procedure: LEFT PAROTIDECTOMY;  Surgeon: Christia Reading, MD;  Location: [facility];  Service: ENT;  Laterality: Left;   SHOULDER SURGERY Right 2018   TONSILLECTOMY     age 78    Social History   Socioeconomic History   Marital status: Married    Spouse name: Not on file   Number of children: Not on file   Years of education: Not on file   Highest education level: Some college, no degree  Occupational History   Not on file  Tobacco Use   Smoking status: Former    Current packs/day: 0.00    Average packs/day: 0.5 packs/day for 30.0 years (15.0 ttl pk-yrs)    Types: Cigarettes    Start date: 09/08/1975    Quit date: 09/07/2005    Years since quitting: 17.7   Smokeless tobacco: Never  Vaping Use   Vaping status: Never Used  Substance and Sexual Activity   Alcohol use: Not Currently   Drug use: No   Sexual activity: Not on file  Other Topics Concern   Not on file  Social History Narrative   Works for [employer]    Married   m   Social Drivers of Corporate investment banker Strain: Low Risk  (02/02/2023)   Overall Financial Resource Strain (CARDIA)    Difficulty of Paying Living Expenses: Not very hard  Food Insecurity: No Food Insecurity (02/02/2023)    Hunger Vital Sign    Worried About Running Out of Food in the Last Year: Never true    Ran Out of Food in the Last Year: Never true  Transportation Needs: No Transportation Needs (02/02/2023)   PRAPARE - Administrator, Civil Service (Medical): No    Lack of Transportation (Non-Medical): No  Physical Activity: Insufficiently Active (02/02/2023)   Exercise Vital Sign    Days of Exercise per Week: 2 days    Minutes of Exercise per Session: 60 min  Stress: Stress Concern Present (02/02/2023)   [employer] of Occupational Health - Occupational Stress Questionnaire    Feeling of Stress : To some extent  Social Connections: Moderately Isolated (02/02/2023)   Social Connection and Isolation Panel [NHANES]    Frequency of Communication with Friends and Family: More than three times a week    Frequency of Social Gatherings with Friends and Family: Twice a week    Attends Religious Services: Never  Active Member of Clubs or Organizations: No    Attends Engineer, structural: Not on file    Marital Status: Married    Family History  Problem Relation Age of Onset   Hypertension Other    Crohn's disease Sister    Colon cancer Neg Hx    Rectal cancer Neg Hx    Stomach cancer Neg Hx    Esophageal cancer Neg Hx     Outpatient Encounter Medications as of 06/09/2023  Medication Sig   acetaminophen (TYLENOL) 500 MG tablet Take 1,000 mg by mouth every 6 (six) hours as needed for mild pain.   allopurinol (ZYLOPRIM) 300 MG tablet TAKE 1 TABLET BY MOUTH EVERY DAY   ALPRAZolam (XANAX) 0.25 MG tablet Take 1 tablet (0.25 mg total) by mouth 2 (two) times daily as needed for anxiety.   Continuous Glucose Sensor (FREESTYLE LIBRE 3 SENSOR) MISC 1 Device by Does not apply route every 14 (fourteen) days. Place 1 sensor on the skin every 14 days. Use to check glucose continuously   dicyclomine (BENTYL) 20 MG tablet TAKE 1 TABLET (20 MG TOTAL) BY MOUTH 4 (FOUR) TIMES DAILY - BEFORE  MEALS AND AT BEDTIME.   diltiazem (CARDIZEM SR) 120 MG 12 hr capsule Take 1 capsule (120 mg total) by mouth daily.   finasteride (PROSCAR) 5 MG tablet TAKE 1 TABLET (5 MG TOTAL) BY MOUTH DAILY.   insulin aspart (FIASP FLEXTOUCH) 100 UNIT/ML FlexTouch Pen Inject 4-7 Units into the skin 3 (three) times daily before meals.   Insulin Pen Needle (PEN NEEDLES) 31G X 5 MM MISC 1 each by Does not apply route 3 (three) times daily with meals. May substitute to any manufacturer covered by patient's insurance.   levothyroxine (SYNTHROID) 150 MCG tablet Take 1 tablet (150 mcg total) by mouth daily.   lisinopril (ZESTRIL) 5 MG tablet TAKE 1 TABLET (5 MG TOTAL) BY MOUTH DAILY.   metoprolol succinate (TOPROL-XL) 50 MG 24 hr tablet TAKE 1 TABLET BY MOUTH EVERY DAY   traMADol (ULTRAM) 50 MG tablet Take 1 tablet (50 mg total) by mouth every 8 (eight) hours as needed.   traZODone (DESYREL) 50 MG tablet TAKE 1 TABLET BY MOUTH EVERY DAY AT BEDTIME AS NEEDED FOR SLEEP   triamcinolone lotion (KENALOG) 0.1 % Apply 1 Application topically 2 (two) times daily as needed. (Patient taking differently: Apply 1 Application topically daily as needed.)   triamterene-hydrochlorothiazide (MAXZIDE) 75-50 MG tablet TAKE 1 TABLET BY MOUTH EVERY DAY   [DISCONTINUED] insulin glargine (LANTUS) 100 UNIT/ML Solostar Pen Inject 10 Units into the skin daily. Inject 10 units under skin daily (Patient taking differently: Inject 15 Units into the skin daily. Inject 10 units under skin daily)   insulin glargine (LANTUS) 100 UNIT/ML Solostar Pen Inject 16 Units into the skin at bedtime. Inject 10 units under skin daily   No facility-administered encounter medications on file as of 06/09/2023.    ALLERGIES: Allergies  Allergen Reactions   Actos [Pioglitazone] Diarrhea   Glipizide     Abdominal pain    Glucophage [Metformin] Diarrhea   Jardiance [Empagliflozin]     Abdominal pain     VACCINATION STATUS: Immunization History  Administered  Date(s) Administered   Fluad Quad(high Dose 65+) 12/09/2018, 01/26/2020   Fluad Trivalent(High Dose 65+) 01/16/2023   Influenza Whole 03/05/2010   Influenza, High Dose Seasonal PF 03/13/2014, 05/28/2015, 12/30/2017, 02/22/2021   Influenza,inj,Quad PF,6+ Mos 02/09/2013   Influenza-Unspecified 02/22/2021   PFIZER Comirnaty(Gray Top)Covid-19 Tri-Sucrose Vaccine 09/06/2020  PFIZER(Purple Top)SARS-COV-2 Vaccination 06/17/2019, 07/12/2019, 01/20/2020   Pneumococcal Conjugate-13 05/28/2015   Pneumococcal Polysaccharide-23 06/25/2016   Tdap 06/16/2011    Diabetes He presents for his initial diabetic visit. He has type 2 diabetes mellitus. Onset time: diagnosed at approx age of 45. His disease course has been improving. Hypoglycemia symptoms include nervousness/anxiousness, sweats and tremors. Associated symptoms include fatigue and weight loss. Pertinent negatives for diabetes include no polydipsia and no polyuria. There are no hypoglycemic complications. Symptoms are stable. (Chronic pancreatitis) Risk factors for coronary artery disease include diabetes mellitus, male sex, tobacco exposure and family history. Current diabetic treatment includes diet and insulin injections. He is compliant with treatment most of the time. His weight is fluctuating minimally. He is following a generally healthy diet. Meal planning includes avoidance of concentrated sweets. He has had a previous visit with a dietitian (sees [place], RDE). He participates in exercise intermittently. (He presents today for his consultation with his CGM showing fluctuating glycemic profile.  His POCT A1c today is 7.9%, increasing from last A1c of 6.8% on 9/27.  He drinks mostly water and some diet tea.  He eats 3 meals per day and snacks between meals.  He admits to this due to recent unintentional weight loss (trying to self-regulate).  He does not engage in routine physical activity.  He is UTD on eye exam, did see podiatry once in the past.   Analysis of his CGM shows TIR 25%, TAR 75%, TBR 0% with a GMI of 9.1%.  He has tried several oral medications in the past to manage his diabetes but was unable to tolerate them due to side effects.) An ACE inhibitor/angiotensin II receptor blocker is being taken. He does not see a podiatrist.Eye exam is current.   He was first diagnosed with thyroid problems over 20 years ago.  He had the Flu and lost about 20 lbs within a few weeks time.  He started developing symptoms with high resting heart rate, diaphoresis, tremors and he was taken to his PCP then transferred to the hospital.  He was about to go in for cardiac surgery but insisted thyroid labs be checked, which showed significant hyperthyroidism.  He did have RAI ablation for this and has since been put on Levothyroxine.  He notes he has bounced around between 150-175 mcg until just recently where his dose was decreased to 150 mcg po daily.  He notes about 3 weeks ago, he read the package insert on instructions for proper administration and has since started taking it properly (empty stomach, waiting at least 30 minutes prior to eating or taking other meds, etc).   He denies any family history of thyroid disease, has never had any dedicated imaging of his thyroid since the initial diagnosis.  Review of systems  Constitutional: + minimally fluctuating body weight (stable but previously been losing unintentionally), current Body mass index is 21.68 kg/m., no fatigue, no subjective hyperthermia, no subjective hypothermia Eyes: no blurry vision, no xerophthalmia ENT: no sore throat, no nodules palpated in throat, no dysphagia/odynophagia, no hoarseness Cardiovascular: no chest pain, no shortness of breath, no palpitations (hx of afib- last episode over a year ago), no leg swelling Respiratory: no cough, no shortness of breath Gastrointestinal: no nausea/vomiting, + chronic diarrhea- improved on pancreatic enzymes Musculoskeletal: chronic left shoulder  pain following fall 2 years ago where he tore rotator cuff Skin: no rashes, no hyperemia, callus to right posterior foot Neurological: + intermittent tremors, no numbness, no tingling, no dizziness Psychiatric: no depression,  no anxiety, + mental fog, + recent short term memory deficits, + irritability at times  Objective:     BP 112/72 (BP Location: Right Arm, Patient Position: Sitting, Cuff Size: Large)   Pulse 60   Ht 5\' 9"  (1.753 m)   Wt 146 lb 12.8 oz (66.6 kg)   BMI 21.68 kg/m   Wt Readings from Last 3 Encounters:  06/09/23 146 lb 12.8 oz (66.6 kg)  05/20/23 141 lb (64 kg)  05/12/23 147 lb 9.6 oz (67 kg)     BP Readings from Last 3 Encounters:  06/09/23 112/72  04/27/23 100/60  03/17/23 110/80     Physical Exam- Limited  Constitutional:  Body mass index is 21.68 kg/m. , not in acute distress, normal state of mind Eyes:  EOMI, no exophthalmos Neck: Supple Thyroid: No gross goiter Cardiovascular: RRR, no murmurs, rubs, or gallops, no edema Respiratory: Adequate breathing efforts, no crackles, rales, rhonchi, or wheezing Musculoskeletal: no gross deformities, strength intact in all four extremities, no gross restriction of joint movements Skin:  no rashes, no hyperemia Neurological: ++ tremor with outstretched hands   Diabetic Foot Exam - Simple   No data filed      CMP ( most recent) CMP     Component Value Date/Time   NA 136 04/20/2023 1335   K 3.6 04/20/2023 1335   CL 103 04/20/2023 1335   CO2 25 04/20/2023 1335   GLUCOSE 244 (H) 04/20/2023 1335   BUN 26 (H) 04/20/2023 1335   CREATININE 0.96 04/20/2023 1335   CREATININE 1.14 01/26/2020 1338   CALCIUM 9.3 04/20/2023 1335   PROT 6.5 03/17/2023 1630   ALBUMIN 4.1 03/17/2023 1630   AST 33 03/17/2023 1630   ALT 54 (H) 03/17/2023 1630   ALKPHOS 67 03/17/2023 1630   BILITOT 0.5 03/17/2023 1630   GFR 77.32 04/20/2023 1335   GFRNONAA >60 10/01/2022 1304   GFRNONAA 64 01/26/2020 1338     Diabetic  Labs (most recent): Lab Results  Component Value Date   HGBA1C 7.9 (A) 06/09/2023   HGBA1C 6.8 (A) 01/16/2023   HGBA1C 10.2 (A) 10/10/2022   MICROALBUR 2.8 (H) 10/10/2022     Lipid Panel ( most recent) Lipid Panel     Component Value Date/Time   CHOL 155 02/28/2021 0833   TRIG 138.0 02/28/2021 0833   HDL 39.20 02/28/2021 0833   CHOLHDL 4 02/28/2021 0833   VLDL 27.6 02/28/2021 0833   LDLCALC 88 02/28/2021 0833   LDLCALC 122 (H) 01/26/2020 1338      Lab Results  Component Value Date   TSH 0.13 (L) 04/20/2023   TSH 10.06 (H) 03/17/2023   TSH 0.93 10/10/2022   TSH 1.09 05/30/2022   TSH 0.08 (L) 04/23/2022   TSH 0.098 (L) 10/15/2021   TSH 0.43 02/28/2021   TSH 1.01 03/01/2020   TSH 9.42 (H) 01/26/2020   TSH 5.33 (H) 06/23/2019   FREET4 1.9 (H) 03/01/2020   FREET4 1.07 06/23/2019   FREET4 1.27 05/20/2010           Assessment & Plan:   1) Type 2 diabetes mellitus with hyperglycemia, with long-term current use of insulin (HCC) (Primary)  He presents today for his consultation with his CGM showing fluctuating glycemic profile.  His POCT A1c today is 7.9%, increasing from last A1c of 6.8% on 9/27.  He drinks mostly water and some diet tea.  He eats 3 meals per day and snacks between meals.  He admits to this due to recent  unintentional weight loss (trying to self-regulate).  He does not engage in routine physical activity.  He is UTD on eye exam, did see podiatry once in the past.  Analysis of his CGM shows TIR 25%, TAR 75%, TBR 0% with a GMI of 9.1%.  He has tried several oral medications in the past to manage his diabetes but was unable to tolerate them due to side effects.  - Vansh Reckart has currently uncontrolled symptomatic type 2 DM since 78 years of age, with most recent A1c of 7.9 %.   He does have history of chronic pancreatitis and EPI on Creon.  His diabetes may be insulin dependent based on this.  We can do more testing to assess for LADA, checking antibodies but  also checking insulin and c-peptide levels.  -Recent labs reviewed.  - I had a long discussion with him about the progressive nature of diabetes and the pathology behind its complications. -his diabetes is complicated by chronic pancreatitis and he remains at a high risk for more acute and chronic complications which include CAD, CVA, CKD, retinopathy, and neuropathy. These are all discussed in detail with him.  The following Lifestyle Medicine recommendations according to American College of Lifestyle Medicine [facility]) were discussed and offered to patient and he agrees to start the journey:  A. Whole Foods, Plant-based plate comprising of fruits and vegetables, plant-based proteins, whole-grain carbohydrates was discussed in detail with the patient.   A list for source of those nutrients were also provided to the patient.  Patient will use only water or unsweetened tea for hydration. B.  The need to stay away from risky substances including alcohol, smoking; obtaining 7 to 9 hours of restorative sleep, at least 150 minutes of moderate intensity exercise weekly, the importance of healthy social connections,  and stress reduction techniques were discussed. C.  A full color page of  Calorie density of various food groups per pound showing examples of each food groups was provided to the patient.  - I have counseled him on diet and weight management by adopting a carbohydrate restricted/protein rich diet. Patient is encouraged to switch to unprocessed or minimally processed complex starch and increased protein intake (animal or plant source), fruits, and vegetables. -  he is advised to stick to a routine mealtimes to eat 3 meals a day and avoid unnecessary snacks (to snack only to correct hypoglycemia).   - he acknowledges that there is a room for improvement in his food and drink choices. - Suggestion is made for him to avoid simple carbohydrates from his diet including Cakes, Sweet Desserts, Ice Cream,  Soda (diet and regular), Sweet Tea, Candies, Chips, Cookies, Store Bought Juices, Alcohol in Excess of 1-2 drinks a day, Artificial Sweeteners, Coffee Creamer, and "Sugar-free" Products. This will help patient to have more stable blood glucose profile and potentially avoid unintended weight gain.  - he is following with Norm Salt, RDN, CDE for diabetes education.  - I have approached him with the following individualized plan to manage his diabetes and patient agrees:   -He is advised to adjust his Lantus to 16 units SQ and start taking it nightly (starting tomorrow night since he already took it this morning).  I discussed and initiated bolus insulin with Fiasp 4-7 units TID with meals if glucose is above 90 and he is eating (Specific instructions on how to titrate insulin dosage based on glucose readings given to patient in writing).  He demonstrated his ability to properly use the  SSI chart to dose meal time insulin with me today.  -he is encouraged to start/continue monitoring glucose 4 times daily (using his CGM), before meals and before bed, to log their readings on the clinic sheets provided, and bring them to review at follow up appointment in 3 months.  - he is warned not to take insulin without proper monitoring per orders. - Adjustment parameters are given to him for hypo and hyperglycemia in writing. - he is encouraged to call clinic for blood glucose levels less than 70 or above 300 mg /dl.  -He reportedly did not tolerate Metformin, Jardiance, Actos, or Glipizide in the past.  - he is not an ideal candidate for incretin therapy given body habitus and BMI <25.  - Specific targets for  A1c; LDL, HDL, and Triglycerides were discussed with the patient.  2) Blood Pressure /Hypertension:  his blood pressure is controlled to target.   he is advised to continue his current medications as prescribed by PCP.  3) Lipids/Hyperlipidemia:    There is no recent lipid panel available to  review, nor does he appear to be on any lipid lowering medications.  Will check lipids on subsequent visits.  4)  Weight/Diet:  his Body mass index is 21.68 kg/m.  -     he is NOT a candidate for weight loss.  Exercise, and detailed carbohydrates information provided  -  detailed on discharge instructions.  5) Chronic Care/Health Maintenance: -he is on ACEI/ARB and not on Statin medications and is encouraged to initiate and continue to follow up with Ophthalmology, Dentist, Podiatrist at least yearly or according to recommendations, and advised to stay away from smoking. I have recommended yearly flu vaccine and pneumonia vaccine at least every 5 years; moderate intensity exercise for up to 150 minutes weekly; and sleep for at least 7 hours a day.  6) Hypothyroidism-RAI induced Had RAI for hyperthyroidism 20+ years ago.  He is currently on Levothyroxine 150 mcg po daily before breakfast which is significantly higher than his weight based maximum dose of around 106 mcg.  Perhaps this may be contributing to his recent unexplained weight loss.  Will recheck TFTs prior to next visit, including antibody testing to help classify his dysfunction further.   - The correct intake of thyroid hormone (Levothyroxine, Synthroid), is on empty stomach first thing in the morning, with water, separated by at least 30 minutes from breakfast and other medications,  and separated by more than 4 hours from calcium, iron, multivitamins, acid reflux medications (PPIs).  - This medication is a life-long medication and will be needed to correct thyroid hormone imbalances for the rest of your life.  The dose may change from time to time, based on thyroid blood work.  - It is extremely important to be consistent taking this medication, near the same time each morning.  -AVOID TAKING PRODUCTS CONTAINING BIOTIN (commonly found in Hair, Skin, Nails vitamins) AS IT INTERFERES WITH THE VALIDITY OF THYROID FUNCTION BLOOD  TESTS.   - he is advised to maintain close follow up with Shirline Frees, NP for primary care needs, as well as his other providers for optimal and coordinated care.   - Time spent in this patient care: 60 min, of which > 50% was spent in counseling him about his diabetes and the rest reviewing his blood glucose logs, discussing his hypoglycemia and hyperglycemia episodes, reviewing his current and previous labs/studies (including abstraction from other facilities) and medications doses and developing a long term treatment  plan based on the latest standards of care/guidelines; and documenting his care.    Please refer to Patient Instructions for Blood Glucose Monitoring and Insulin/Medications Dosing Guide" in media tab for additional information. Please also refer to "Patient Self Inventory" in the Media tab for reviewed elements of pertinent patient history.  Jeanmarie Plant participated in the discussions, expressed understanding, and voiced agreement with the above plans.  All questions were answered to his satisfaction. he is encouraged to contact clinic should he have any questions or concerns prior to his return visit.     Follow up plan: - Return in about 3 months (around 09/06/2023) for Diabetes F/U with A1c in office, Previsit labs, Thyroid follow up, Bring meter and logs.    Ronny Bacon, [facility] [address] [place], [place] [address] Phone: [phone] Fax: [phone]  06/09/2023, 12:04 PM

## 2023-06-16 ENCOUNTER — Telehealth: Payer: Self-pay

## 2023-06-16 ENCOUNTER — Encounter: Payer: Self-pay | Admitting: Nutrition

## 2023-06-16 ENCOUNTER — Encounter: Payer: Medicare HMO | Attending: Adult Health | Admitting: Nutrition

## 2023-06-16 VITALS — Ht 69.0 in | Wt 146.0 lb

## 2023-06-16 DIAGNOSIS — E118 Type 2 diabetes mellitus with unspecified complications: Secondary | ICD-10-CM | POA: Insufficient documentation

## 2023-06-16 DIAGNOSIS — I10 Essential (primary) hypertension: Secondary | ICD-10-CM | POA: Insufficient documentation

## 2023-06-16 NOTE — Progress Notes (Signed)
 Medical Nutrition Therapy  Appointment Start time: 1420  Appointment End time:  1500  Primary concerns today: DM Type 2  Referral diagnosis: E11.8 Preferred learning style: no preference) Learning readiness: Ready    NUTRITION ASSESSMENT Followup Dm Type 2 78 yr old wmale referred for recent diagnosis of Type 2 DM. PCP Shirline Frees, NP. Sees Dr. Myrtie Neither for GI. Is using the OTC pancreatic enzyme. He thought the OTC medication was helping and is using Beano to help with gas. Stools are getting better. Still are firming up. No more cramping of stomach or bowels.  Takes Thyroid medication at 230 am. Will see Ronny Bacon, FNP at [facility] in May 2025. Will do labs a week before. Currently taking Lantus 16 units at night and 4 units with meals plus sliding scale Fiasp.  Colonoscopy is postponed to May. Libre 3.  He just started meal time insulin 3 days ago and it has made tremendous  improvement in his blood sugars. Eating healthier foods choices on time now. Diet may be low in carbs at some meals, contributing to lower blood sugars occasionally. He is adjusting well.  He has gained 5 lbs in the last month. Expect further weight gain as blood sugars get better under control with needed insulin.  Wt Readings from Last 3 Encounters:  06/16/23 146 lb (66.2 kg)  06/09/23 146 lb 12.8 oz (66.6 kg)  05/20/23 141 lb (64 kg)   Ht Readings from Last 3 Encounters:  06/16/23 5\' 9"  (1.753 m)  06/09/23 5\' 9"  (1.753 m)  05/20/23 5' 8.5" (1.74 m)   Body mass index is 21.56 kg/m. @BMIFA @ Facility age limit for growth %iles is 20 years. Facility age limit for growth %iles is 20 years.    Wt Readings from Last 3 Encounters:  06/09/23 146 lb 12.8 oz (66.6 kg)  05/20/23 141 lb (64 kg)  05/12/23 147 lb 9.6 oz (67 kg)   Ht Readings from Last 3 Encounters:  06/09/23 5\' 9"  (1.753 m)  05/20/23 5' 8.5" (1.74 m)  05/12/23 5' 8.5" (1.74 m)   There is no height or weight on file to calculate  BMI. @BMIFA @ Facility age limit for growth %iles is 20 years. Facility age limit for growth %iles is 20 years.   Clinical Medical Hx:  Past Medical History:  Diagnosis Date   Adult acne    Cancer (HCC) 07/2017   skin cancer   Chronic Pancreatitis 01/2023   Diabetes mellitus without complication (HCC)    Dyspnea    Gout    Hypertension    Hypothyroidism    Thyroid disease    Lab Results  Component Value Date   HGBA1C 7.9 (A) 06/09/2023      Latest Ref Rng & Units 04/20/2023    1:35 PM 03/17/2023    4:30 PM 10/24/2022    9:28 AM  CMP  Glucose 70 - 99 mg/dL 161  096  045   BUN 6 - 23 mg/dL 26  21  30    Creatinine 0.40 - 1.50 mg/dL 4.09  8.11  9.14   Sodium 135 - 145 mEq/L 136  137  133   Potassium 3.5 - 5.1 mEq/L 3.6  3.3  3.5   Chloride 96 - 112 mEq/L 103  101  97   CO2 19 - 32 mEq/L 25  28  25    Calcium 8.4 - 10.5 mg/dL 9.3  9.5  9.7   Total Protein 6.0 - 8.3 g/dL  6.5    Total  Bilirubin 0.2 - 1.2 mg/dL  0.5    Alkaline Phos 39 - 117 U/L  67    AST 0 - 37 U/L  33    ALT 0 - 53 U/L  54     Lipid Panel     Component Value Date/Time   CHOL 155 02/28/2021 0833   TRIG 138.0 02/28/2021 0833   HDL 39.20 02/28/2021 0833   CHOLHDL 4 02/28/2021 0833   VLDL 27.6 02/28/2021 0833   LDLCALC 88 02/28/2021 0833   LDLCALC 122 (H) 01/26/2020 1338     Medications:  Current Outpatient Medications on File Prior to Visit  Medication Sig Dispense Refill   acetaminophen (TYLENOL) 500 MG tablet Take 1,000 mg by mouth every 6 (six) hours as needed for mild pain.     allopurinol (ZYLOPRIM) 300 MG tablet TAKE 1 TABLET BY MOUTH EVERY DAY 90 tablet 3   ALPRAZolam (XANAX) 0.25 MG tablet Take 1 tablet (0.25 mg total) by mouth 2 (two) times daily as needed for anxiety. 20 tablet 1   Continuous Glucose Sensor (FREESTYLE LIBRE 3 SENSOR) MISC 1 Device by Does not apply route every 14 (fourteen) days. Place 1 sensor on the skin every 14 days. Use to check glucose continuously 6 each 6    dicyclomine (BENTYL) 20 MG tablet TAKE 1 TABLET (20 MG TOTAL) BY MOUTH 4 (FOUR) TIMES DAILY - BEFORE MEALS AND AT BEDTIME. 120 tablet 2   diltiazem (CARDIZEM SR) 120 MG 12 hr capsule Take 1 capsule (120 mg total) by mouth daily. 90 capsule 2   finasteride (PROSCAR) 5 MG tablet TAKE 1 TABLET (5 MG TOTAL) BY MOUTH DAILY. 90 tablet 3   insulin aspart (FIASP FLEXTOUCH) 100 UNIT/ML FlexTouch Pen Inject 4-7 Units into the skin 3 (three) times daily before meals. 25 mL 3   insulin glargine (LANTUS) 100 UNIT/ML Solostar Pen Inject 16 Units into the skin at bedtime. Inject 10 units under skin daily     Insulin Pen Needle (PEN NEEDLES) 31G X 5 MM MISC 1 each by Does not apply route 3 (three) times daily with meals. May substitute to any manufacturer covered by patient's insurance. 100 each 3   levothyroxine (SYNTHROID) 150 MCG tablet Take 1 tablet (150 mcg total) by mouth daily. 30 tablet 0   lisinopril (ZESTRIL) 5 MG tablet TAKE 1 TABLET (5 MG TOTAL) BY MOUTH DAILY. 90 tablet 0   metoprolol succinate (TOPROL-XL) 50 MG 24 hr tablet TAKE 1 TABLET BY MOUTH EVERY DAY 90 tablet 3   traMADol (ULTRAM) 50 MG tablet Take 1 tablet (50 mg total) by mouth every 8 (eight) hours as needed. 60 tablet 2   traZODone (DESYREL) 50 MG tablet TAKE 1 TABLET BY MOUTH EVERY DAY AT BEDTIME AS NEEDED FOR SLEEP 90 tablet 1   triamcinolone lotion (KENALOG) 0.1 % Apply 1 Application topically 2 (two) times daily as needed. (Patient taking differently: Apply 1 Application topically daily as needed.) 60 mL 2   triamterene-hydrochlorothiazide (MAXZIDE) 75-50 MG tablet Take 1 tablet by mouth daily. 90 tablet 1   No current facility-administered medications on file prior to visit.     Labs:  Lab Results  Component Value Date   HGBA1C 7.9 (A) 06/09/2023      Latest Ref Rng & Units 04/20/2023    1:35 PM 03/17/2023    4:30 PM 10/24/2022    9:28 AM  CMP  Glucose 70 - 99 mg/dL 161  096  045   BUN 6 -  23 mg/dL 26  21  30    Creatinine  0.40 - 1.50 mg/dL 0.98  1.19  1.47   Sodium 135 - 145 mEq/L 136  137  133   Potassium 3.5 - 5.1 mEq/L 3.6  3.3  3.5   Chloride 96 - 112 mEq/L 103  101  97   CO2 19 - 32 mEq/L 25  28  25    Calcium 8.4 - 10.5 mg/dL 9.3  9.5  9.7   Total Protein 6.0 - 8.3 g/dL  6.5    Total Bilirubin 0.2 - 1.2 mg/dL  0.5    Alkaline Phos 39 - 117 U/L  67    AST 0 - 37 U/L  33    ALT 0 - 53 U/L  54     Lipid Panel     Component Value Date/Time   CHOL 155 02/28/2021 0833   TRIG 138.0 02/28/2021 0833   HDL 39.20 02/28/2021 0833   CHOLHDL 4 02/28/2021 0833   VLDL 27.6 02/28/2021 0833   LDLCALC 88 02/28/2021 0833   LDLCALC 122 (H) 01/26/2020 1338    Notable Signs/Symptoms: Weakness at times.  Lifestyle & Dietary Hx Lives with wife. Eats 3 meals per day He cooks mostly. Only eats out 1 per week.  Estimated daily fluid intake: 60 oz Supplements: MG, Potassium Sleep: poor- only gets 3.-4 hrs at night. Can't sleep long. Has been a problem his whole life Stress / self-care:  Current average weekly physical activity: Active outside  24-Hr Dietary Recall B)2 slices wheat toast, butter,  and egg/cheese, 1/2 bagel with cream cheese and salmon 3-4 oz L) taco salad, unsweet tea or water D) 4 shrimp, chicken thigh, salad, 2 shurbert rolls,   Estimated Energy Needs Calories: 2000 Carbohydrate: 225g Protein: 150g Fat: 56g   NUTRITION DIAGNOSIS  NB-1.1 Food and nutrition-related knowledge deficit As related to DIabetes.  As evidenced by A1C 10.2%.[name]   NUTRITION INTERVENTION  Nutrition education (E-1) on the following topics:  Pancreatitis Nutrition Therapy       Lower fat diet       DM impact and need for increased insulin Benefits of seeing an Endocrinologist.for Dm and Pancrea    Lifestyle Medicine  - Whole Food, Plant Predominant Nutrition is highly recommended: Eat Plenty of vegetables, Mushrooms, fruits, Legumes, Whole Grains, Nuts, seeds in lieu of processed meats, processed  snacks/pastries red meat, poultry, eggs.    -It is better to avoid simple carbohydrates including: Cakes, Sweet Desserts, Ice Cream, Soda (diet and regular), Sweet Tea, Candies, Chips, Cookies, Store Bought Juices, Alcohol in Excess of  1-2 drinks a day, Lemonade,  Artificial Sweeteners, Doughnuts, Coffee Creamers, "Sugar-free" Products, etc, etc.  This is not a complete list.....  Exercise: If you are able: 30 -60 minutes a day ,4 days a week, or 150 minutes a week.  The longer the better.  Combine stretch, strength, and aerobic activities.  If you were told in the past that you have high risk for cardiovascular diseases, you may seek evaluation by your heart doctor prior to initiating moderate to intense exercise programs.   Handouts Provided Include  Know your numbers Lifestyle Medicine  Learning Style & Readiness for Change Teaching method utilized: Visual & Auditory  Demonstrated degree of understanding via: Teach Back  Barriers to learning/adherence to lifestyle change: none  Goals Established by Pt Goals  Keep up the great job. Take insulin as prescribed. Aim closer to 60 grams of carbs per meal Keep up the great  job. Let MD know if blood sugars drop in the 70's, let MD know.  MONITORING & EVALUATION Dietary intake, weekly physical activity, and BS  in 1  months.  Needs fiancial assistance with Creon or similar product. Recommend referral to Endocrinology to assist with his thyroid and pancreas issues for better management of his DM.  Next Steps  Patient is to work on meal planning and eating more consistent whole plant based foods.[name]

## 2023-06-16 NOTE — Patient Instructions (Signed)
 Goals  Aim closer to 60 grams of carbs per meal Keep up the great job. Let MD know if blood sugars drop in the 70's, let MD know.

## 2023-06-16 NOTE — Telephone Encounter (Signed)
 Copied from CRM [id number]. Topic: Clinical - Prescription Issue >> Jun 16, 2023 10:54 AM Gurney Maxin H wrote: Reason for CRM: Patient was calling in regarding refill for his insulin glargine (LANTUS) 100 UNIT/ML Solostar Pen, states pharmacy denied refill and agent see's medication was discontinued by provider on 2/18, please reach out to patient for some clarity, thanks.  Steen [phone]

## 2023-06-17 NOTE — Telephone Encounter (Signed)
Noted! Pt notified of update.  

## 2023-06-17 NOTE — Telephone Encounter (Signed)
 Please advise if insulin is now filled by Endo.

## 2023-06-18 ENCOUNTER — Telehealth: Payer: Self-pay | Admitting: Nurse Practitioner

## 2023-06-18 MED ORDER — INSULIN GLARGINE 100 UNIT/ML SOLOSTAR PEN: 20.0000 [IU] | PEN_INJECTOR | Freq: Every day | SUBCUTANEOUS | 3 refills | Status: DC

## 2023-06-18 NOTE — Telephone Encounter (Signed)
 Pt needs Lantus Solostar 100units/mL called into [facility] [address] [facility].  He stated they told him it needed to be on a sliding scale due to possibly having to inject more or less so he can get it refilled as needed.  He stated he is out because he used to get from his PCP until he came here.

## 2023-06-18 NOTE — Telephone Encounter (Signed)
 I sent in for Lantus 20 units SQ nightly to [facility] [address] for him.  This is a tiny bit more than what I recommended at last visit but this should get him a good stock pile.  I do not recommend changing the dose without first following up with me or [facility].

## 2023-06-19 NOTE — Telephone Encounter (Signed)
 Patient was called and made aware.

## 2023-06-19 NOTE — Telephone Encounter (Signed)
 Can you call and let him know this.

## 2023-06-22 ENCOUNTER — Encounter: Payer: Medicare HMO | Admitting: Gastroenterology

## 2023-07-16 ENCOUNTER — Encounter: Payer: Self-pay | Admitting: Adult Health

## 2023-07-16 ENCOUNTER — Other Ambulatory Visit: Payer: Self-pay | Admitting: Adult Health

## 2023-07-16 MED ORDER — TRAMADOL HCL 50 MG PO TABS: 50.0000 mg | ORAL_TABLET | Freq: Three times a day (TID) | ORAL | 2 refills | Status: AC | PRN

## 2023-07-25 ENCOUNTER — Other Ambulatory Visit: Payer: Self-pay | Admitting: Adult Health

## 2023-07-25 DIAGNOSIS — I10 Essential (primary) hypertension: Secondary | ICD-10-CM

## 2023-08-05 ENCOUNTER — Encounter: Payer: Self-pay | Admitting: Adult Health

## 2023-08-05 ENCOUNTER — Other Ambulatory Visit: Payer: Self-pay | Admitting: Family Medicine

## 2023-08-05 DIAGNOSIS — F419 Anxiety disorder, unspecified: Secondary | ICD-10-CM

## 2023-08-05 MED ORDER — ALPRAZOLAM 0.25 MG PO TABS: 0.2500 mg | ORAL_TABLET | Freq: Two times a day (BID) | ORAL | 0 refills | Status: DC | PRN

## 2023-08-05 NOTE — Progress Notes (Signed)
 Refilling medication since patients PCP, Alto Atta NP, is out of the office. PDMP reviewed. Last refilled on 05/06/2022.

## 2023-08-05 NOTE — Telephone Encounter (Signed)
 Noted ty

## 2023-08-11 ENCOUNTER — Other Ambulatory Visit: Payer: Self-pay | Admitting: Adult Health

## 2023-08-11 DIAGNOSIS — E119 Type 2 diabetes mellitus without complications: Secondary | ICD-10-CM

## 2023-08-11 DIAGNOSIS — M1A20X0 Drug-induced chronic gout, unspecified site, without tophus (tophi): Secondary | ICD-10-CM

## 2023-08-11 DIAGNOSIS — Z794 Long term (current) use of insulin: Secondary | ICD-10-CM

## 2023-08-11 NOTE — Telephone Encounter (Signed)
  The original prescription was discontinued on 03/11/2023 by Shirline Frees, NP. Renewing this prescription may not be appropriate.

## 2023-08-12 ENCOUNTER — Telehealth: Payer: Self-pay | Admitting: Nurse Practitioner

## 2023-08-12 NOTE — Telephone Encounter (Signed)
 That is not a medication we prescribe from here, perhaps he meant to call his GI doctor for this?  We can manage diabetes and hypothyroidism meds but not gut meds.

## 2023-08-12 NOTE — Telephone Encounter (Signed)
 Pt states that PCP stated that you would need to refill prescription for Dicyclomine  20 mg.  [address] [facility].

## 2023-08-12 NOTE — Telephone Encounter (Signed)
 Called pt to let him know Whitneys recommendations

## 2023-08-19 ENCOUNTER — Ambulatory Visit (INDEPENDENT_AMBULATORY_CARE_PROVIDER_SITE_OTHER): Admitting: Adult Health

## 2023-08-19 VITALS — BP 130/62 | HR 54 | Temp 98.2°F | Ht 69.0 in | Wt 147.0 lb

## 2023-08-19 DIAGNOSIS — G8929 Other chronic pain: Secondary | ICD-10-CM | POA: Diagnosis not present

## 2023-08-19 DIAGNOSIS — R197 Diarrhea, unspecified: Secondary | ICD-10-CM | POA: Diagnosis not present

## 2023-08-19 DIAGNOSIS — M545 Low back pain, unspecified: Secondary | ICD-10-CM

## 2023-08-19 DIAGNOSIS — R109 Unspecified abdominal pain: Secondary | ICD-10-CM | POA: Diagnosis not present

## 2023-08-19 DIAGNOSIS — I480 Paroxysmal atrial fibrillation: Secondary | ICD-10-CM | POA: Diagnosis not present

## 2023-08-19 MED ORDER — CYCLOBENZAPRINE HCL 10 MG PO TABS
10.0000 mg | ORAL_TABLET | Freq: Every day | ORAL | 0 refills | Status: DC
Start: 2023-08-19 — End: 2024-03-15

## 2023-08-19 MED ORDER — DICYCLOMINE HCL 20 MG PO TABS: 20.0000 mg | ORAL_TABLET | Freq: Three times a day (TID) | ORAL | 1 refills | Status: DC

## 2023-08-19 NOTE — Progress Notes (Signed)
 Subjective:    Patient ID: Jose Evans, male    DOB: 09-Feb-1946, 78 y.o.   MRN: [id number]  HPI 78 year old male who  has a past medical history of Adult acne, Cancer (HCC) (07/2017), Chronic Pancreatitis (01/2023), Diabetes mellitus without complication (HCC), Dyspnea, Gout, Hypertension, Hypothyroidism, and Thyroid  disease.  He presents to the office today for multiple issues.  He has a history of chronic low back pain/muscle spasms that happen periodically when he goes downstairs or standing for long periods of time.  He does try and stretch to help alleviate some of the discomfort but finds that pain usually resides when he sits down and rests for a short period of time.  Muscle spasms are not debilitating but are annoying.  In the past he has used Flexeril periodically but we could not prescribe it because he was on Xarelto  for PAF.  Stopped Xarelto  what sounds like 2 years ago for worsening low back pain.  He has not feelings of [name] since that time.  Believes taking Adderall triggered his atrial fibrillation.  Furthermore he needs a refill of Bentyl  that he takes daily to help with abdominal pain and diarrhea.  He has seen by gastroenterology and had a colonoscopy scheduled but this had to be canceled and the date changed.  Feels as though the Bentyl  keeps his symptoms well-managed   Review of Systems See HPI  Past Medical History:  Diagnosis Date   Adult acne    Cancer (HCC) 07/2017   skin cancer   Chronic Pancreatitis 01/2023   Diabetes mellitus without complication (HCC)    Dyspnea    Gout    Hypertension    Hypothyroidism    Thyroid  disease     Social History   Socioeconomic History   Marital status: Married    Spouse name: Not on file   Number of children: Not on file   Years of education: Not on file   Highest education level: Associate degree: occupational, Scientist, product/process development, or vocational program  Occupational History   Not on file  Tobacco Use   Smoking status:  Former    Current packs/day: 0.00    Average packs/day: 0.5 packs/day for 30.0 years (15.0 ttl pk-yrs)    Types: Cigarettes    Start date: 09/08/1975    Quit date: 09/07/2005    Years since quitting: 17.9   Smokeless tobacco: Never  Vaping Use   Vaping status: Never Used  Substance and Sexual Activity   Alcohol use: Not Currently   Drug use: No   Sexual activity: Not on file  Other Topics Concern   Not on file  Social History Narrative   Works for [employer]    Married   m   Social Drivers of Corporate investment banker Strain: Low Risk  (08/19/2023)   Overall Financial Resource Strain (CARDIA)    Difficulty of Paying Living Expenses: Not very hard  Food Insecurity: No Food Insecurity (08/19/2023)   Hunger Vital Sign    Worried About Running Out of Food in the Last Year: Never true    Ran Out of Food in the Last Year: Never true  Transportation Needs: No Transportation Needs (08/19/2023)   PRAPARE - Administrator, Civil Service (Medical): No    Lack of Transportation (Non-Medical): No  Physical Activity: Insufficiently Active (08/19/2023)   Exercise Vital Sign    Days of Exercise per Week: 2 days    Minutes of Exercise per Session:  30 min  Stress: No Stress Concern Present (08/19/2023)   [employer] of Occupational Health - Occupational Stress Questionnaire    Feeling of Stress : Only a little  Social Connections: Moderately Isolated (08/19/2023)   Social Connection and Isolation Panel [NHANES]    Frequency of Communication with Friends and Family: More than three times a week    Frequency of Social Gatherings with Friends and Family: Once a week    Attends Religious Services: Never    Database administrator or Organizations: No    Attends Engineer, structural: Not on file    Marital Status: Married  Catering manager Violence: Not At Risk (03/28/2022)   Humiliation, Afraid, Rape, and Kick questionnaire    Fear of Current or Ex-Partner:  No    Emotionally Abused: No    Physically Abused: No    Sexually Abused: No    Past Surgical History:  Procedure Laterality Date   HERNIA REPAIR  2007   x2   PAROTIDECTOMY Left 10/07/2022   Procedure: LEFT PAROTIDECTOMY;  Surgeon: Virgina Grills, MD;  Location: [facility];  Service: ENT;  Laterality: Left;   SHOULDER SURGERY Right 2018   TONSILLECTOMY     age 77    Family History  Problem Relation Age of Onset   Hypertension Other    Crohn's disease Sister    Colon cancer Neg Hx    Rectal cancer Neg Hx    Stomach cancer Neg Hx    Esophageal cancer Neg Hx     Allergies  Allergen Reactions   Actos  [Pioglitazone ] Diarrhea   Glipizide      Abdominal pain    Glucophage  [Metformin ] Diarrhea   Jardiance  [Empagliflozin ]     Abdominal pain     Current Outpatient Medications on File Prior to Visit  Medication Sig Dispense Refill   acetaminophen  (TYLENOL ) 500 MG tablet Take 1,000 mg by mouth every 6 (six) hours as needed for mild pain.     allopurinol  (ZYLOPRIM ) 300 MG tablet TAKE 1 TABLET BY MOUTH EVERY DAY 90 tablet 3   ALPRAZolam  (XANAX ) 0.25 MG tablet Take 1 tablet (0.25 mg total) by mouth 2 (two) times daily as needed for anxiety. 20 tablet 0   Continuous Glucose Sensor (FREESTYLE LIBRE 3 SENSOR) MISC 1 Device by Does not apply route every 14 (fourteen) days. Place 1 sensor on the skin every 14 days. Use to check glucose continuously 6 each 6   diltiazem  (CARDIZEM  SR) 120 MG 12 hr capsule Take 1 capsule (120 mg total) by mouth daily. 90 capsule 2   finasteride  (PROSCAR ) 5 MG tablet TAKE 1 TABLET (5 MG TOTAL) BY MOUTH DAILY. 90 tablet 3   insulin  aspart (FIASP  FLEXTOUCH) 100 UNIT/ML FlexTouch Pen Inject 4-7 Units into the skin 3 (three) times daily before meals. 25 mL 3   insulin  glargine (LANTUS ) 100 UNIT/ML Solostar Pen Inject 20 Units into the skin at bedtime. Inject 10 units under skin daily 18 mL 3   Insulin  Pen Needle (PEN NEEDLES) 31G X 5 MM MISC 1 each by  Does not apply route 3 (three) times daily with meals. May substitute to any manufacturer covered by patient's insurance. 100 each 3   levothyroxine  (SYNTHROID ) 150 MCG tablet Take 1 tablet (150 mcg total) by mouth daily. 30 tablet 0   lisinopril  (ZESTRIL ) 5 MG tablet TAKE 1 TABLET (5 MG TOTAL) BY MOUTH DAILY. 90 tablet 0   metoprolol  succinate (TOPROL -XL) 50 MG 24 hr tablet TAKE  1 TABLET BY MOUTH EVERY DAY 90 tablet 3   traMADol  (ULTRAM ) 50 MG tablet Take 1 tablet (50 mg total) by mouth every 8 (eight) hours as needed. 60 tablet 2   traZODone  (DESYREL ) 50 MG tablet TAKE 1 TABLET BY MOUTH EVERY DAY AT BEDTIME AS NEEDED FOR SLEEP 90 tablet 1   triamcinolone  lotion (KENALOG ) 0.1 % Apply 1 Application topically 2 (two) times daily as needed. (Patient taking differently: Apply 1 Application topically daily as needed.) 60 mL 2   triamterene -hydrochlorothiazide (MAXZIDE) 75-50 MG tablet Take 1 tablet by mouth daily. 90 tablet 1   dicyclomine  (BENTYL ) 20 MG tablet TAKE 1 TABLET (20 MG TOTAL) BY MOUTH 4 (FOUR) TIMES DAILY - BEFORE MEALS AND AT BEDTIME. 120 tablet 2   No current facility-administered medications on file prior to visit.    BP 130/62   Pulse (!) 54   Temp 98.2 F (36.8 C) (Oral)   Ht 5\' 9"  (1.753 m)   Wt 147 lb (66.7 kg)   SpO2 98%   BMI 21.71 kg/m       Objective:   Physical Exam Vitals and nursing note reviewed.  Constitutional:      Appearance: Normal appearance.  Cardiovascular:     Rate and Rhythm: Normal rate and regular rhythm.     Pulses: Normal pulses.     Heart sounds: Normal heart sounds.  Pulmonary:     Effort: Pulmonary effort is normal.     Breath sounds: Normal breath sounds.  Abdominal:     General: Abdomen is flat. Bowel sounds are normal.     Palpations: Abdomen is soft.  Musculoskeletal:        General: Tenderness present. Normal range of motion.  Skin:    General: Skin is warm and dry.  Neurological:     General: No focal deficit present.      Mental Status: He is alert and oriented to person, place, and time.  Psychiatric:        Mood and Affect: Mood normal.        Behavior: Behavior normal.        Thought Content: Thought content normal.        Judgment: Judgment normal.       Assessment & Plan:  1. Chronic bilateral low back pain without sciatica (Primary) - Will send in a prescription for Flexeril; not for every day use. Increase stretching  - cyclobenzaprine (FLEXERIL) 10 MG tablet; Take 1 tablet (10 mg total) by mouth at bedtime.  Dispense: 30 tablet; Refill: 0  2. Abdominal cramping  - dicyclomine  (BENTYL ) 20 MG tablet; Take 1 tablet (20 mg total) by mouth 4 (four) times daily -  before meals and at bedtime.  Dispense: 360 tablet; Refill: 1  3. Diarrhea, unspecified type  - dicyclomine  (BENTYL ) 20 MG tablet; Take 1 tablet (20 mg total) by mouth 4 (four) times daily -  before meals and at bedtime.  Dispense: 360 tablet; Refill: 1  4. PAF (paroxysmal atrial fibrillation) (HCC) - He understands his risks being off anticoagulation due to history of PAF. These  risks include heart attack stroke, or death.  He does have an appointment with [name] clinic coming up.  Meygan Kyser, NP

## 2023-08-21 ENCOUNTER — Encounter: Payer: Medicare HMO | Admitting: Gastroenterology

## 2023-08-22 ENCOUNTER — Other Ambulatory Visit: Payer: Self-pay | Admitting: Adult Health

## 2023-08-22 DIAGNOSIS — Z794 Long term (current) use of insulin: Secondary | ICD-10-CM

## 2023-08-22 DIAGNOSIS — E119 Type 2 diabetes mellitus without complications: Secondary | ICD-10-CM

## 2023-08-24 ENCOUNTER — Other Ambulatory Visit: Payer: Self-pay | Admitting: Adult Health

## 2023-08-24 DIAGNOSIS — M1A20X0 Drug-induced chronic gout, unspecified site, without tophus (tophi): Secondary | ICD-10-CM

## 2023-08-25 ENCOUNTER — Encounter: Payer: Self-pay | Admitting: Nurse Practitioner

## 2023-08-25 ENCOUNTER — Encounter: Payer: Self-pay | Admitting: Adult Health

## 2023-08-25 DIAGNOSIS — M1A20X0 Drug-induced chronic gout, unspecified site, without tophus (tophi): Secondary | ICD-10-CM

## 2023-08-25 DIAGNOSIS — Z794 Long term (current) use of insulin: Secondary | ICD-10-CM

## 2023-08-25 DIAGNOSIS — E119 Type 2 diabetes mellitus without complications: Secondary | ICD-10-CM

## 2023-08-25 MED ORDER — PEN NEEDLES 31G X 5 MM MISC: 3 refills | Status: DC

## 2023-08-26 MED ORDER — ALLOPURINOL 300 MG PO TABS: 300.0000 mg | ORAL_TABLET | Freq: Every day | ORAL | 3 refills | Status: AC

## 2023-09-03 ENCOUNTER — Ambulatory Visit: Payer: Self-pay | Admitting: Nurse Practitioner

## 2023-09-07 LAB — COMPREHENSIVE METABOLIC PANEL WITH GFR
ALT: 111 IU/L — ABNORMAL HIGH (ref 0–44)
AST: 57 IU/L — ABNORMAL HIGH (ref 0–40)
Albumin: 4.3 g/dL (ref 3.8–4.8)
Alkaline Phosphatase: 89 IU/L (ref 44–121)
BUN/Creatinine Ratio: 32 — ABNORMAL HIGH (ref 10–24)
BUN: 29 mg/dL — ABNORMAL HIGH (ref 8–27)
Bilirubin Total: 0.4 mg/dL (ref 0.0–1.2)
CO2: 21 mmol/L (ref 20–29)
Calcium: 9.5 mg/dL (ref 8.6–10.2)
Chloride: 104 mmol/L (ref 96–106)
Creatinine, Ser: 0.92 mg/dL (ref 0.76–1.27)
Globulin, Total: 2 g/dL (ref 1.5–4.5)
Glucose: 55 mg/dL — ABNORMAL LOW (ref 70–99)
Potassium: 3.9 mmol/L (ref 3.5–5.2)
Sodium: 142 mmol/L (ref 134–144)
Total Protein: 6.3 g/dL (ref 6.0–8.5)
eGFR: 87 mL/min/{1.73_m2} (ref >59)

## 2023-09-07 LAB — VITAMIN D 25 HYDROXY (VIT D DEFICIENCY, FRACTURES): Vit D, 25-Hydroxy: 21.1 ng/mL — ABNORMAL LOW (ref 30.0–100.0)

## 2023-09-07 LAB — INSULIN AND C-PEPTIDE, SERUM
C-Peptide: 0.7 ng/mL — ABNORMAL LOW (ref 1.1–4.4)
INSULIN: 2 u[IU]/mL — ABNORMAL LOW (ref 2.6–24.9)

## 2023-09-07 LAB — ANTI-ISLET CELL ANTIBODY

## 2023-09-07 LAB — GLUTAMIC ACID DECARBOXYLASE AUTO ABS

## 2023-09-07 LAB — T4, FREE: Free T4: 1.63 ng/dL (ref 0.82–1.77)

## 2023-09-07 LAB — THYROGLOBULIN ANTIBODY: Thyroglobulin Antibody: <1 [IU]/mL (ref 0.0–0.9)

## 2023-09-07 LAB — THYROID PEROXIDASE ANTIBODY: Thyroperoxidase Ab SerPl-aCnc: <9 [IU]/mL (ref 0–34)

## 2023-09-07 LAB — TSH: TSH: 0.202 u[IU]/mL — ABNORMAL LOW (ref 0.450–4.500)

## 2023-09-08 ENCOUNTER — Encounter: Payer: Self-pay | Admitting: Nurse Practitioner

## 2023-09-08 ENCOUNTER — Encounter: Payer: Medicare HMO | Attending: Nurse Practitioner | Admitting: Nutrition

## 2023-09-08 ENCOUNTER — Ambulatory Visit: Payer: Medicare HMO | Admitting: Nurse Practitioner

## 2023-09-08 VITALS — Ht 69.0 in | Wt 148.0 lb

## 2023-09-08 VITALS — BP 104/62 | HR 62 | Ht 69.0 in | Wt 148.0 lb

## 2023-09-08 DIAGNOSIS — Z794 Long term (current) use of insulin: Secondary | ICD-10-CM

## 2023-09-08 DIAGNOSIS — E118 Type 2 diabetes mellitus with unspecified complications: Secondary | ICD-10-CM | POA: Diagnosis not present

## 2023-09-08 DIAGNOSIS — K8500 Idiopathic acute pancreatitis without necrosis or infection: Secondary | ICD-10-CM | POA: Diagnosis not present

## 2023-09-08 DIAGNOSIS — E109 Type 1 diabetes mellitus without complications: Secondary | ICD-10-CM

## 2023-09-08 DIAGNOSIS — I10 Essential (primary) hypertension: Secondary | ICD-10-CM | POA: Diagnosis not present

## 2023-09-08 DIAGNOSIS — E1165 Type 2 diabetes mellitus with hyperglycemia: Secondary | ICD-10-CM

## 2023-09-08 DIAGNOSIS — E890 Postprocedural hypothyroidism: Secondary | ICD-10-CM

## 2023-09-08 DIAGNOSIS — E039 Hypothyroidism, unspecified: Secondary | ICD-10-CM

## 2023-09-08 LAB — POCT GLYCOSYLATED HEMOGLOBIN (HGB A1C): Hemoglobin A1C: 6.2 % — AB (ref 4.0–5.6)

## 2023-09-08 MED ORDER — FREESTYLE LIBRE 3 PLUS SENSOR MISC: 3 refills | Status: DC

## 2023-09-08 MED ORDER — LEVOTHYROXINE SODIUM 150 MCG PO TABS: 150.0000 ug | ORAL_TABLET | Freq: Every day | ORAL | 1 refills | Status: DC

## 2023-09-08 MED ORDER — FIASP FLEXTOUCH 100 UNIT/ML ~~LOC~~ SOPN: 4.0000 [IU] | PEN_INJECTOR | Freq: Three times a day (TID) | SUBCUTANEOUS | 3 refills | Status: DC

## 2023-09-08 MED ORDER — INSULIN GLARGINE 100 UNIT/ML SOLOSTAR PEN: 20.0000 [IU] | PEN_INJECTOR | Freq: Every day | SUBCUTANEOUS | 3 refills | Status: DC

## 2023-09-08 NOTE — Progress Notes (Signed)
 Medical Nutrition Therapy  Appointment Start time  1030  Appointment End time:  1045  Primary concerns today: DM Type 2  Referral diagnosis: E11.8 Preferred learning style: no preference) Learning readiness: Ready    NUTRITION ASSESSMENT Followup Dm Type 2 Feels best when he eats on time. Stays hungry between meals at times. Has  problems with thyroid  and will see Hulon Magic, FNP today at [facility]. Working on his thyoid issues as well has his pancreas issues. CMG  Freestyle Libre TIR 80%, 5% low blood sugars, 14% TAR and 1% Very high. Complains of blood sugar dropping in am. Stays hungry. May benefit from reducing long actng insulin  slighly due to hypoglycemia episodes. He is using his sliding scale insulin  coverage. Wt stable. Appetite is good. Taking OTC enzymes for pancreas.  Wt Readings from Last 3 Encounters:  09/08/23 148 lb (67.1 kg)  09/08/23 148 lb (67.1 kg)  08/19/23 147 lb (66.7 kg)   Ht Readings from Last 3 Encounters:  09/08/23 5\' 9"  (1.753 m)  09/08/23 5\' 9"  (1.753 m)  08/19/23 5\' 9"  (1.753 m)   Body mass index is 21.86 kg/m. @BMIFA @ Facility age limit for growth %iles is 20 years. Facility age limit for growth %iles is 20 years.   Wt Readings from Last 3 Encounters:  08/19/23 147 lb (66.7 kg)  06/16/23 146 lb (66.2 kg)  06/09/23 146 lb 12.8 oz (66.6 kg)   Ht Readings from Last 3 Encounters:  08/19/23 5\' 9"  (1.753 m)  06/16/23 5\' 9"  (1.753 m)  06/09/23 5\' 9"  (1.753 m)   There is no height or weight on file to calculate BMI. @BMIFA @ Facility age limit for growth %iles is 20 years. Facility age limit for growth %iles is 20 years.   Clinical Medical Hx:  Past Medical History:  Diagnosis Date   Adult acne    Cancer (HCC) 07/2017   skin cancer   Chronic Pancreatitis 01/2023   Diabetes mellitus without complication (HCC)    Dyspnea    Gout    Hypertension    Hypothyroidism    Thyroid  disease    Lab Results  Component Value Date   HGBA1C 7.9  (A) 06/09/2023      Latest Ref Rng & Units 09/04/2023    9:25 AM 04/20/2023    1:35 PM 03/17/2023    4:30 PM  CMP  Glucose 70 - 99 mg/dL 55  478  295   BUN 8 - 27 mg/dL 29  26  21    Creatinine 0.76 - 1.27 mg/dL 6.21  3.08  6.57   Sodium 134 - 144 mmol/L 142  136  137   Potassium 3.5 - 5.2 mmol/L 3.9  3.6  3.3   Chloride 96 - 106 mmol/L 104  103  101   CO2 20 - 29 mmol/L 21  25  28    Calcium 8.6 - 10.2 mg/dL 9.5  9.3  9.5   Total Protein 6.0 - 8.5 g/dL 6.3   6.5   Total Bilirubin 0.0 - 1.2 mg/dL 0.4   0.5   Alkaline Phos 44 - 121 IU/L 89   67   AST 0 - 40 IU/L 57   33   ALT 0 - 44 IU/L 111   54    Lipid Panel     Component Value Date/Time   CHOL 155 02/28/2021 0833   TRIG 138.0 02/28/2021 0833   HDL 39.20 02/28/2021 0833   CHOLHDL 4 02/28/2021 0833   VLDL 27.6 02/28/2021 8469  LDLCALC 88 02/28/2021 0833   LDLCALC 122 (H) 01/26/2020 1338     Medications:  Current Outpatient Medications on File Prior to Visit  Medication Sig Dispense Refill   acetaminophen  (TYLENOL ) 500 MG tablet Take 1,000 mg by mouth every 6 (six) hours as needed for mild pain.     allopurinol  (ZYLOPRIM ) 300 MG tablet Take 1 tablet (300 mg total) by mouth daily. 90 tablet 3   ALPRAZolam  (XANAX ) 0.25 MG tablet Take 1 tablet (0.25 mg total) by mouth 2 (two) times daily as needed for anxiety. 20 tablet 0   Continuous Glucose Sensor (FREESTYLE LIBRE 3 SENSOR) MISC 1 Device by Does not apply route every 14 (fourteen) days. Place 1 sensor on the skin every 14 days. Use to check glucose continuously 6 each 6   cyclobenzaprine  (FLEXERIL ) 10 MG tablet Take 1 tablet (10 mg total) by mouth at bedtime. 30 tablet 0   dicyclomine  (BENTYL ) 20 MG tablet Take 1 tablet (20 mg total) by mouth 4 (four) times daily -  before meals and at bedtime. 360 tablet 1   diltiazem  (CARDIZEM  SR) 120 MG 12 hr capsule Take 1 capsule (120 mg total) by mouth daily. 90 capsule 2   finasteride  (PROSCAR ) 5 MG tablet TAKE 1 TABLET (5 MG TOTAL)  BY MOUTH DAILY. 90 tablet 3   insulin  aspart (FIASP  FLEXTOUCH) 100 UNIT/ML FlexTouch Pen Inject 4-7 Units into the skin 3 (three) times daily before meals. 25 mL 3   insulin  glargine (LANTUS ) 100 UNIT/ML Solostar Pen Inject 20 Units into the skin at bedtime. Inject 10 units under skin daily 18 mL 3   Insulin  Pen Needle (PEN NEEDLES) 31G X 5 MM MISC Use to inject insulin  4 times daily 300 each 3   levothyroxine  (SYNTHROID ) 150 MCG tablet Take 1 tablet (150 mcg total) by mouth daily. 30 tablet 0   lisinopril  (ZESTRIL ) 5 MG tablet TAKE 1 TABLET (5 MG TOTAL) BY MOUTH DAILY. 90 tablet 0   metoprolol  succinate (TOPROL -XL) 50 MG 24 hr tablet TAKE 1 TABLET BY MOUTH EVERY DAY 90 tablet 3   traMADol  (ULTRAM ) 50 MG tablet Take 1 tablet (50 mg total) by mouth every 8 (eight) hours as needed. 60 tablet 2   traZODone  (DESYREL ) 50 MG tablet TAKE 1 TABLET BY MOUTH EVERY DAY AT BEDTIME AS NEEDED FOR SLEEP 90 tablet 1   triamcinolone  lotion (KENALOG ) 0.1 % Apply 1 Application topically 2 (two) times daily as needed. (Patient taking differently: Apply 1 Application topically daily as needed.) 60 mL 2   triamterene -hydrochlorothiazide (MAXZIDE) 75-50 MG tablet Take 1 tablet by mouth daily. 90 tablet 1   No current facility-administered medications on file prior to visit.     Labs:  Lab Results  Component Value Date   HGBA1C 7.9 (A) 06/09/2023      Latest Ref Rng & Units 09/04/2023    9:25 AM 04/20/2023    1:35 PM 03/17/2023    4:30 PM  CMP  Glucose 70 - 99 mg/dL 55  161  096   BUN 8 - 27 mg/dL 29  26  21    Creatinine 0.76 - 1.27 mg/dL 0.45  4.09  8.11   Sodium 134 - 144 mmol/L 142  136  137   Potassium 3.5 - 5.2 mmol/L 3.9  3.6  3.3   Chloride 96 - 106 mmol/L 104  103  101   CO2 20 - 29 mmol/L 21  25  28    Calcium 8.6 - 10.2 mg/dL  9.5  9.3  9.5   Total Protein 6.0 - 8.5 g/dL 6.3   6.5   Total Bilirubin 0.0 - 1.2 mg/dL 0.4   0.5   Alkaline Phos 44 - 121 IU/L 89   67   AST 0 - 40 IU/L 57   33   ALT 0  - 44 IU/L 111   54    Lipid Panel     Component Value Date/Time   CHOL 155 02/28/2021 0833   TRIG 138.0 02/28/2021 0833   HDL 39.20 02/28/2021 0833   CHOLHDL 4 02/28/2021 0833   VLDL 27.6 02/28/2021 0833   LDLCALC 88 02/28/2021 0833   LDLCALC 122 (H) 01/26/2020 1338    Notable Signs/Symptoms: Weakness at times.  Lifestyle & Dietary Hx Lives with wife. Eats 3 meals per day He cooks mostly. Only eats out 1 per week.  Estimated daily fluid intake: 60 oz Supplements: MG, Potassium Sleep: poor- only gets 3.-4 hrs at night. Can't sleep long. Has been a problem his whole life Stress / self-care:  Current average weekly physical activity: Active outside  24-Hr Dietary Recall B)2 slices wheat toast, butter,  and egg/cheese, 1/2 bagel with cream cheese and salmon 3-4 oz L) taco salad, unsweet tea or water D) 4 shrimp, chicken thigh, salad, 2 shurbert rolls,   Estimated Energy Needs Calories: 2000 Carbohydrate: 225g Protein: 150g Fat: 56g   NUTRITION DIAGNOSIS  NB-1.1 Food and nutrition-related knowledge deficit As related to DIabetes.  As evidenced by A1C 10.2%.Aaron Aas   NUTRITION INTERVENTION  Nutrition education (E-1) on the following topics:  Pancreatitis Nutrition Therapy       Lower fat diet       DM impact and need for increased insulin  Benefits of seeing an Endocrinologist.for Dm and Pancrea    Lifestyle Medicine  - Whole Food, Plant Predominant Nutrition is highly recommended: Eat Plenty of vegetables, Mushrooms, fruits, Legumes, Whole Grains, Nuts, seeds in lieu of processed meats, processed snacks/pastries red meat, poultry, eggs.    -It is better to avoid simple carbohydrates including: Cakes, Sweet Desserts, Ice Cream, Soda (diet and regular), Sweet Tea, Candies, Chips, Cookies, Store Bought Juices, Alcohol in Excess of  1-2 drinks a day, Lemonade,  Artificial Sweeteners, Doughnuts, Coffee Creamers, "Sugar-free" Products, etc, etc.  This is not a complete  list.....  Exercise: If you are able: 30 -60 minutes a day ,4 days a week, or 150 minutes a week.  The longer the better.  Combine stretch, strength, and aerobic activities.  If you were told in the past that you have high risk for cardiovascular diseases, you may seek evaluation by your heart doctor prior to initiating moderate to intense exercise programs.   Handouts Provided Include  Know your numbers Lifestyle Medicine  Learning Style & Readiness for Change Teaching method utilized: Visual & Auditory  Demonstrated degree of understanding via: Teach Back  Barriers to learning/adherence to lifestyle change: none  Goals Established by Pt Goals  Eat 45-75 grams of carbs per meal. We can always adjust insulin  to match what you're eating to meet your nutritional needs. Don't need to lose any weight. Reduce long acting insulin  per Whitney. Increase protein and high fiber foods Let Whitney know if glucose is less than 70  mg/dl. 2-3 times per week.  MONITORING & EVALUATION Dietary intake, weekly physical activity, and BS  in 1  months.  Needs fiancial assistance with Creon  or similar product. Recommend referral to Endocrinology to assist with his thyroid  and pancreas  issues for better management of his DM.  Next Steps  Patient is to work on meal planning and eating more consistent whole plant based foods.Aaron Aas

## 2023-09-08 NOTE — Progress Notes (Signed)
 Endocrinology Follow Up Note       09/08/2023, 12:10 PM   Subjective:    Patient ID: Jose Evans, male    DOB: May 01, 1945.  Jose Evans is being seen in follow up after being seen in consultation for management of currently uncontrolled symptomatic diabetes requested by  Alto Atta, NP.   Past Medical History:  Diagnosis Date   Adult acne    Cancer (HCC) 07/2017   skin cancer   Chronic Pancreatitis 01/2023   Diabetes mellitus without complication (HCC)    Dyspnea    Gout    Hypertension    Hypothyroidism    Thyroid  disease     Past Surgical History:  Procedure Laterality Date   HERNIA REPAIR  2007   x2   PAROTIDECTOMY Left 10/07/2022   Procedure: LEFT PAROTIDECTOMY;  Surgeon: Virgina Grills, MD;  Location: [facility];  Service: ENT;  Laterality: Left;   SHOULDER SURGERY Right 2018   TONSILLECTOMY     age 78    Social History   Socioeconomic History   Marital status: Married    Spouse name: Not on file   Number of children: Not on file   Years of education: Not on file   Highest education level: Associate degree: occupational, Scientist, product/process development, or vocational program  Occupational History   Not on file  Tobacco Use   Smoking status: Former    Current packs/day: 0.00    Average packs/day: 0.5 packs/day for 30.0 years (15.0 ttl pk-yrs)    Types: Cigarettes    Start date: 09/08/1975    Quit date: 09/07/2005    Years since quitting: 18.0   Smokeless tobacco: Never  Vaping Use   Vaping status: Never Used  Substance and Sexual Activity   Alcohol use: Not Currently   Drug use: No   Sexual activity: Not on file  Other Topics Concern   Not on file  Social History Narrative   Works for [employer]    Married   m   Social Drivers of Corporate investment banker Strain: Low Risk  (08/19/2023)   Overall Financial Resource Strain (CARDIA)    Difficulty of Paying Living  Expenses: Not very hard  Food Insecurity: No Food Insecurity (08/19/2023)   Hunger Vital Sign    Worried About Running Out of Food in the Last Year: Never true    Ran Out of Food in the Last Year: Never true  Transportation Needs: No Transportation Needs (08/19/2023)   PRAPARE - Administrator, Civil Service (Medical): No    Lack of Transportation (Non-Medical): No  Physical Activity: Insufficiently Active (08/19/2023)   Exercise Vital Sign    Days of Exercise per Week: 2 days    Minutes of Exercise per Session: 30 min  Stress: No Stress Concern Present (08/19/2023)   [employer] of Occupational Health - Occupational Stress Questionnaire    Feeling of Stress : Only a little  Social Connections: Moderately Isolated (08/19/2023)   Social Connection and Isolation Panel [NHANES]    Frequency of Communication with Friends and Family: More than three times a week    Frequency of Social Gatherings with Friends and  Family: Once a week    Attends Religious Services: Never    Database administrator or Organizations: No    Attends Engineer, structural: Not on file    Marital Status: Married    Family History  Problem Relation Age of Onset   Hypertension Other    Crohn's disease Sister    Colon cancer Neg Hx    Rectal cancer Neg Hx    Stomach cancer Neg Hx    Esophageal cancer Neg Hx     Outpatient Encounter Medications as of 09/08/2023  Medication Sig   acetaminophen  (TYLENOL ) 500 MG tablet Take 1,000 mg by mouth every 6 (six) hours as needed for mild pain.   allopurinol  (ZYLOPRIM ) 300 MG tablet Take 1 tablet (300 mg total) by mouth daily.   ALPRAZolam  (XANAX ) 0.25 MG tablet Take 1 tablet (0.25 mg total) by mouth 2 (two) times daily as needed for anxiety.   Continuous Glucose Sensor (FREESTYLE LIBRE 3 PLUS SENSOR) MISC Change sensor every 15 days.   cyclobenzaprine  (FLEXERIL ) 10 MG tablet Take 1 tablet (10 mg total) by mouth at bedtime.   dicyclomine  (BENTYL ) 20 MG  tablet Take 1 tablet (20 mg total) by mouth 4 (four) times daily -  before meals and at bedtime.   diltiazem  (CARDIZEM  SR) 120 MG 12 hr capsule Take 1 capsule (120 mg total) by mouth daily.   finasteride  (PROSCAR ) 5 MG tablet TAKE 1 TABLET (5 MG TOTAL) BY MOUTH DAILY.   lisinopril  (ZESTRIL ) 5 MG tablet TAKE 1 TABLET (5 MG TOTAL) BY MOUTH DAILY.   metoprolol  succinate (TOPROL -XL) 50 MG 24 hr tablet TAKE 1 TABLET BY MOUTH EVERY DAY   traMADol  (ULTRAM ) 50 MG tablet Take 1 tablet (50 mg total) by mouth every 8 (eight) hours as needed.   traZODone  (DESYREL ) 50 MG tablet TAKE 1 TABLET BY MOUTH EVERY DAY AT BEDTIME AS NEEDED FOR SLEEP   triamcinolone  lotion (KENALOG ) 0.1 % Apply 1 Application topically 2 (two) times daily as needed. (Patient taking differently: Apply 1 Application topically daily as needed.)   triamterene -hydrochlorothiazide (MAXZIDE) 75-50 MG tablet Take 1 tablet by mouth daily.   [DISCONTINUED] Continuous Glucose Sensor (FREESTYLE LIBRE 3 SENSOR) MISC 1 Device by Does not apply route every 14 (fourteen) days. Place 1 sensor on the skin every 14 days. Use to check glucose continuously   [DISCONTINUED] insulin  aspart (FIASP  FLEXTOUCH) 100 UNIT/ML FlexTouch Pen Inject 4-7 Units into the skin 3 (three) times daily before meals.   [DISCONTINUED] insulin  glargine (LANTUS ) 100 UNIT/ML Solostar Pen Inject 20 Units into the skin at bedtime. Inject 10 units under skin daily   [DISCONTINUED] levothyroxine  (SYNTHROID ) 150 MCG tablet Take 1 tablet (150 mcg total) by mouth daily.   insulin  aspart (FIASP  FLEXTOUCH) 100 UNIT/ML FlexTouch Pen Inject 4-7 Units into the skin 3 (three) times daily before meals.   insulin  glargine (LANTUS ) 100 UNIT/ML Solostar Pen Inject 20 Units into the skin at bedtime.   Insulin  Pen Needle (PEN NEEDLES) 31G X 5 MM MISC Use to inject insulin  4 times daily   levothyroxine  (SYNTHROID ) 150 MCG tablet Take 1 tablet (150 mcg total) by mouth daily.   No facility-administered  encounter medications on file as of 09/08/2023.    ALLERGIES: Allergies  Allergen Reactions   Actos  [Pioglitazone ] Diarrhea   Glipizide      Abdominal pain    Glucophage  [Metformin ] Diarrhea   Jardiance  [Empagliflozin ]     Abdominal pain     VACCINATION STATUS: Immunization  History  Administered Date(s) Administered   Fluad Quad(high Dose 65+) 12/09/2018, 01/26/2020   Fluad Trivalent(High Dose 65+) 01/16/2023   Influenza Whole 03/05/2010   Influenza, High Dose Seasonal PF 03/13/2014, 05/28/2015, 12/30/2017, 02/22/2021   Influenza,inj,Quad PF,6+ Mos 02/09/2013   Influenza-Unspecified 02/22/2021   PFIZER Comirnaty(Gray Top)Covid-19 Tri-Sucrose Vaccine 09/06/2020   PFIZER(Purple Top)SARS-COV-2 Vaccination 06/17/2019, 07/12/2019, 01/20/2020   Pneumococcal Conjugate-13 05/28/2015   Pneumococcal Polysaccharide-23 06/25/2016   Tdap 06/16/2011    Diabetes He presents for his follow-up diabetic visit. He has type 2 diabetes mellitus. Onset time: diagnosed at approx age of 67. His disease course has been improving. Hypoglycemia symptoms include nervousness/anxiousness, sweats and tremors. Pertinent negatives for diabetes include no fatigue, no polydipsia, no polyuria and no weight loss. Hypoglycemia complications include nocturnal hypoglycemia. Symptoms are improving. (Chronic pancreatitis) Risk factors for coronary artery disease include diabetes mellitus, male sex, tobacco exposure and family history. Current diabetic treatment includes diet and insulin  injections. He is compliant with treatment most of the time. His weight is fluctuating minimally. He is following a generally healthy diet. Meal planning includes avoidance of concentrated sweets. He has had a previous visit with a dietitian (sees [place], RDE). He participates in exercise intermittently. His home blood glucose trend is decreasing steadily. (He presents today with his CGM showing tightening glycemic profile overall.  His POCT A1c  today is 6.2%, improving from last visit of 7.9%.  He has adapted well to MDI treatment, and saw Penny Crumpton for diet eduction today as well.  Analysis of his CGM shows TIR 80%, TAR 15%, TBR 5% with a GMI of 6.4%.  He does get low alarms here within the last 2 weeks more frequently but he has been more active with warmer weather as well.) An ACE inhibitor/angiotensin II receptor blocker is being taken. He does not see a podiatrist.Eye exam is current.   He was first diagnosed with thyroid  problems over 20 years ago.  He had the Flu and lost about 20 lbs within a few weeks time.  He started developing symptoms with high resting heart rate, diaphoresis, tremors and he was taken to his PCP then transferred to the hospital.  He was about to go in for cardiac surgery but insisted thyroid  labs be checked, which showed significant hyperthyroidism.  He did have RAI ablation for this and has since been put on Levothyroxine .  He notes he has bounced around between 150-175 mcg until just recently where his dose was decreased to 150 mcg po daily.  He notes about 3 weeks ago, he read the package insert on instructions for proper administration and has since started taking it properly (empty stomach, waiting at least 30 minutes prior to eating or taking other meds, etc).   He denies any family history of thyroid  disease, has never had any dedicated imaging of his thyroid  since the initial diagnosis.  Review of systems  Constitutional: + minimally fluctuating body weight (stable but previously been losing unintentionally), current Body mass index is 21.86 kg/m., no fatigue, no subjective hyperthermia, no subjective hypothermia Eyes: no blurry vision, no xerophthalmia ENT: no sore throat, no nodules palpated in throat, no dysphagia/odynophagia, no hoarseness Cardiovascular: no chest pain, no shortness of breath, no palpitations (hx of afib- last episode over a year ago), no leg swelling Respiratory: no cough, no  shortness of breath Gastrointestinal: no nausea/vomiting, + chronic diarrhea- improved on pancreatic enzymes Musculoskeletal: chronic left shoulder pain following fall 2 years ago where he tore rotator cuff Skin: no rashes,  no hyperemia Neurological: + intermittent tremors, no numbness, no tingling, no dizziness Psychiatric: no depression, no anxiety,   Objective:     BP 104/62 (BP Location: Right Arm, Patient Position: Sitting, Cuff Size: Large)   Pulse 62   Ht 5\' 9"  (1.753 m)   Wt 148 lb (67.1 kg)   BMI 21.86 kg/m   Wt Readings from Last 3 Encounters:  09/08/23 148 lb (67.1 kg)  09/08/23 148 lb (67.1 kg)  08/19/23 147 lb (66.7 kg)     BP Readings from Last 3 Encounters:  09/08/23 104/62  08/19/23 130/62  06/09/23 112/72     Physical Exam- Limited  Constitutional:  Body mass index is 21.86 kg/m. , not in acute distress, normal state of mind Eyes:  EOMI, no exophthalmos Musculoskeletal: no gross deformities, strength intact in all four extremities, no gross restriction of joint movements Skin:  no rashes, no hyperemia   Diabetic Foot Exam - Simple   No data filed      CMP ( most recent) CMP     Component Value Date/Time   NA 142 09/04/2023 0925   K 3.9 09/04/2023 0925   CL 104 09/04/2023 0925   CO2 21 09/04/2023 0925   GLUCOSE 55 (L) 09/04/2023 0925   GLUCOSE 244 (H) 04/20/2023 1335   BUN 29 (H) 09/04/2023 0925   CREATININE 0.92 09/04/2023 0925   CREATININE 1.14 01/26/2020 1338   CALCIUM 9.5 09/04/2023 0925   PROT 6.3 09/04/2023 0925   ALBUMIN 4.3 09/04/2023 0925   AST 57 (H) 09/04/2023 0925   ALT 111 (H) 09/04/2023 0925   ALKPHOS 89 09/04/2023 0925   BILITOT 0.4 09/04/2023 0925   GFR 77.32 04/20/2023 1335   EGFR 87 09/04/2023 0925   GFRNONAA >60 10/01/2022 1304   GFRNONAA 64 01/26/2020 1338     Diabetic Labs (most recent): Lab Results  Component Value Date   HGBA1C 6.2 (A) 09/08/2023   HGBA1C 7.9 (A) 06/09/2023   HGBA1C 6.8 (A) 01/16/2023    MICROALBUR 2.8 (H) 10/10/2022     Lipid Panel ( most recent) Lipid Panel     Component Value Date/Time   CHOL 155 02/28/2021 0833   TRIG 138.0 02/28/2021 0833   HDL 39.20 02/28/2021 0833   CHOLHDL 4 02/28/2021 0833   VLDL 27.6 02/28/2021 0833   LDLCALC 88 02/28/2021 0833   LDLCALC 122 (H) 01/26/2020 1338      Lab Results  Component Value Date   TSH 0.202 (L) 09/04/2023   TSH 0.13 (L) 04/20/2023   TSH 10.06 (H) 03/17/2023   TSH 0.93 10/10/2022   TSH 1.09 05/30/2022   TSH 0.08 (L) 04/23/2022   TSH 0.098 (L) 10/15/2021   TSH 0.43 02/28/2021   TSH 1.01 03/01/2020   TSH 9.42 (H) 01/26/2020   FREET4 1.63 09/04/2023   FREET4 1.9 (H) 03/01/2020   FREET4 1.07 06/23/2019   FREET4 1.27 05/20/2010           Assessment & Plan:   1) Type 2 diabetes mellitus with hyperglycemia, with long-term current use of insulin  (HCC) (Primary)  He presents today with his CGM showing tightening glycemic profile overall.  His POCT A1c today is 6.2%, improving from last visit of 7.9%.  He has adapted well to MDI treatment, and saw Penny Crumpton for diet eduction today as well.  Analysis of his CGM shows TIR 80%, TAR 15%, TBR 5% with a GMI of 6.4%.  He does get low alarms here within the last 2 weeks  more frequently but he has been more active with warmer weather as well.  - Jose Evans has currently controlled symptomatic type 1 DM since 78 years of age.   He does have history of chronic pancreatitis and EPI on Creon .  His diabetes may be insulin  dependent based on this.  His GAD and insulin  antibodies were negative ruling out LADA but his insulin  and cpeptide levels were low indicating his pancreas is not producing the amount of insulin  his body needs, thus he will be treated as type 1.  -Recent labs reviewed.  LFTs were elevated, I did advise him to discuss with his GI specialist.  - I had a long discussion with him about the progressive nature of diabetes and the pathology behind its  complications. -his diabetes is complicated by chronic pancreatitis and he remains at a high risk for more acute and chronic complications which include CAD, CVA, CKD, retinopathy, and neuropathy. These are all discussed in detail with him.  The following Lifestyle Medicine recommendations according to American College of Lifestyle Medicine [facility]) were discussed and offered to patient and he agrees to start the journey:  A. Whole Foods, Plant-based plate comprising of fruits and vegetables, plant-based proteins, whole-grain carbohydrates was discussed in detail with the patient.   A list for source of those nutrients were also provided to the patient.  Patient will use only water or unsweetened tea for hydration. B.  The need to stay away from risky substances including alcohol, smoking; obtaining 7 to 9 hours of restorative sleep, at least 150 minutes of moderate intensity exercise weekly, the importance of healthy social connections,  and stress reduction techniques were discussed. C.  A full color Jose of  Calorie density of various food groups per pound showing examples of each food groups was provided to the patient.  - I have counseled him on diet and weight management by adopting a carbohydrate restricted/protein rich diet. Patient is encouraged to switch to unprocessed or minimally processed complex starch and increased protein intake (animal or plant source), fruits, and vegetables. -  he is advised to stick to a routine mealtimes to eat 3 meals a day and avoid unnecessary snacks (to snack only to correct hypoglycemia).   - he acknowledges that there is a room for improvement in his food and drink choices. - Suggestion is made for him to avoid simple carbohydrates from his diet including Cakes, Sweet Desserts, Ice Cream, Soda (diet and regular), Sweet Tea, Candies, Chips, Cookies, Store Bought Juices, Alcohol in Excess of 1-2 drinks a day, Artificial Sweeteners, Coffee Creamer, and "Sugar-free"  Products. This will help patient to have more stable blood glucose profile and potentially avoid unintended weight gain.  - he is following with Penny Crumpton, RDN, CDE for diabetes education.  - I have approached him with the following individualized plan to manage his diabetes and patient agrees:   -His diabetes will be managed as type 1, likely from damage to pancreas from chronic inflammation.  -He is advised to adjust his Lantus  to 12 units SQ nightly and continue his Fiasp  4-7 units TID with meals if glucose above 70 and he is eating (Specific instructions on how to titrate insulin  dosage based on glucose readings given to patient in writing).  -he is encouraged to continue monitoring glucose 4 times daily (using his CGM), before meals and before bed, and to call the clinic if he has readings less than 70 or above 200 for 3 tests in a row.  -  he is warned not to take insulin  without proper monitoring per orders. - Adjustment parameters are given to him for hypo and hyperglycemia in writing.  -He reportedly did not tolerate Metformin , Jardiance , Actos , or Glipizide  in the past.  - he is not an ideal candidate for incretin therapy given body habitus and BMI <25.  - Specific targets for  A1c; LDL, HDL, and Triglycerides were discussed with the patient.  2) Blood Pressure /Hypertension:  his blood pressure is controlled to target.   he is advised to continue his current medications as prescribed by PCP.  3) Lipids/Hyperlipidemia:    There is no recent lipid panel available to review, nor does he appear to be on any lipid lowering medications.  Will check lipids on subsequent visits.  4)  Weight/Diet:  his Body mass index is 21.86 kg/m.  -     he is NOT a candidate for weight loss.  Exercise, and detailed carbohydrates information provided  -  detailed on discharge instructions.  5) Chronic Care/Health Maintenance: -he is on ACEI/ARB and not on Statin medications and is encouraged  to initiate and continue to follow up with Ophthalmology, Dentist, Podiatrist at least yearly or according to recommendations, and advised to stay away from smoking. I have recommended yearly flu vaccine and pneumonia vaccine at least every 5 years; moderate intensity exercise for up to 150 minutes weekly; and sleep for at least 7 hours a day.  6) Hypothyroidism-RAI induced Had RAI for hyperthyroidism 20+ years ago.  He is currently on Levothyroxine  150 mcg po daily before breakfast which is significantly higher than his weight based maximum dose of around 106 mcg.  Perhaps this may be contributing to his recent unexplained weight loss.    Antibody testing was negative, ruling out autoimmune thyroid  dysfunction.  His previsit TFTs are consistent with appropriate hormone replacement.  TSH is slightly suppressed but Free T4 is normal and he denies any overt symptoms of over-replacement.  Thus he is advised to continue Levothyroxine  150 mcg po daily for now.  The consistent use of Creon  may help with absorption of this medication, thus we may need to lower in the future.  Will recheck TFTs prior to next visit and adjust dose accordingly.   - The correct intake of thyroid  hormone (Levothyroxine , Synthroid ), is on empty stomach first thing in the morning, with water, separated by at least 30 minutes from breakfast and other medications,  and separated by more than 4 hours from calcium, iron, multivitamins, acid reflux medications (PPIs).  - This medication is a life-long medication and will be needed to correct thyroid  hormone imbalances for the rest of your life.  The dose may change from time to time, based on thyroid  blood work.  - It is extremely important to be consistent taking this medication, near the same time each morning.  -AVOID TAKING PRODUCTS CONTAINING BIOTIN (commonly found in Hair, Skin, Nails vitamins) AS IT INTERFERES WITH THE VALIDITY OF THYROID  FUNCTION BLOOD TESTS.  7) Vitamin D   deficiency His recent vitamin d  level was low at 21.1.  He did start OTC multivitamin with Vitamin D  in it.  I advised him he needs to take 5000 units of Vitamin D3 daily.  Will plan to recheck on subsequent visits.    - he is advised to maintain close follow up with Nafziger, Cory, NP for primary care needs, as well as his other providers for optimal and coordinated care.    I spent  43  minutes  in the care of the patient today including review of labs from CMP, Lipids, Thyroid  Function, Hematology (current and previous including abstractions from other facilities); face-to-face time discussing  his blood glucose readings/logs, discussing hypoglycemia and hyperglycemia episodes and symptoms, medications doses, his options of short and long term treatment based on the latest standards of care / guidelines;  discussion about incorporating lifestyle medicine;  and documenting the encounter. Risk reduction counseling performed per USPSTF guidelines to reduce obesity and cardiovascular risk factors.     Please refer to Patient Instructions for Blood Glucose Monitoring and Insulin /Medications Dosing Guide"  in media tab for additional information. Please  also refer to " Patient Self Inventory" in the Media  tab for reviewed elements of pertinent patient history.  Joanna Muck participated in the discussions, expressed understanding, and voiced agreement with the above plans.  All questions were answered to his satisfaction. he is encouraged to contact clinic should he have any questions or concerns prior to his return visit.     Follow up plan: - Return in about 3 months (around 12/09/2023) for Diabetes F/U with A1c in office, Previsit labs, Bring meter and logs, Thyroid  follow up.    Hulon Magic, [facility] [address] [place], [place] [address] Phone: [phone] Fax: [phone]  09/08/2023, 12:10 PM

## 2023-09-09 ENCOUNTER — Encounter: Payer: Self-pay | Admitting: Nutrition

## 2023-09-09 NOTE — Patient Instructions (Addendum)
 Goals  Eat 45-75 grams of carbs per meal. We can always adjust insulin  to match what you're eating to meet your nutritional needs. Don't need to lose any weight. Reduce long acting insulin  per Whitney. Increase protein and high fiber foods Let Whitney know if glucose is less than 70 mg/dl. 2-3 times per week.

## 2023-09-25 ENCOUNTER — Other Ambulatory Visit: Payer: Self-pay | Admitting: Family Medicine

## 2023-09-25 DIAGNOSIS — F419 Anxiety disorder, unspecified: Secondary | ICD-10-CM

## 2023-11-13 ENCOUNTER — Encounter (HOSPITAL_COMMUNITY): Payer: Self-pay | Admitting: Physician Assistant

## 2023-11-13 ENCOUNTER — Ambulatory Visit (HOSPITAL_COMMUNITY)
Admission: RE | Admit: 2023-11-13 | Discharge: 2023-11-13 | Disposition: A | Discharge status: Home / Self Care | Source: Ambulatory Visit | Attending: Physician Assistant | Admitting: Physician Assistant

## 2023-11-13 VITALS — BP 114/70 | HR 58 | Ht 69.0 in | Wt 148.0 lb

## 2023-11-13 DIAGNOSIS — I480 Paroxysmal atrial fibrillation: Secondary | ICD-10-CM

## 2023-11-13 DIAGNOSIS — D6869 Other thrombophilia: Secondary | ICD-10-CM

## 2023-11-13 MED ORDER — DABIGATRAN ETEXILATE MESYLATE 150 MG PO CAPS: 150.0000 mg | ORAL_CAPSULE | Freq: Two times a day (BID) | ORAL | 11 refills | Status: AC

## 2023-11-13 NOTE — Progress Notes (Signed)
 Primary Care Physician: Jose Huxley, NP Referring Physician: same GI: Dr. [name] is a 78 y.o. male with a h/o of paroxysmal afib in the afib clinic for evaluation. He wore a monitor in October that showed 3% afib burden as well SVT. He was placed on ASA and referred to afib clinic.  His EKG today shows atrial flutter with variable block at 109 bpm. He is unaware. He has had episodes for years of having heart pain associated with racing, bilateral arm pain, pain at base of head and then a warm flush usually signals that it is breaking. At one time, he was having 5-6 episodes  a month. Now 1-2 a month. He takes prn metoprolol  tartrate with episodes. He has a CHA2DS2VASc score of 2. No tobacco, caffeine or alcohol. Denies snoring.   Pt has had a recent positive cologuard and is pending colonoscopy 05/27/21. He has not noted any blood in stool.   F/u in afib clinic, 05/30/21. Unfortunately, he presented for colonoscopy 2/6 and was in afib with RVR. He was sent to the ER for further treatment. He was given diltiazem  and sent home with rx for 120 mg daily along with his metoprolol . He was in rate controlled afib at time of d/c from  ER but is in SR today. He feels he flipped on his own later that day, he could not be cardioverted in the ER as he had been off his xarelto  for 2 days for colonoscopy. He has been holding his lisinopril  since Cardizem  was added. BP elevated here but encouraged to follow at home and if BP is still running over 140 systolic would encourage him to  take daily.   F/u in afib clinic, 08/28/21 for 3 month f/u. He is in SR. He is c/o of arthralgia's that coincide with starting of diltiazem . He wakes up in the am, taking dilt in the am and develops aches by lunch with lack of energy and then feels better at bedtime. No afib to report.    F/u in afib clinic, 10/16/21. He is here to f/u ER visit form yesterday. He presented to pre op visit for upcoming shoulder surgery  09/25/21. He presented fairly asymptomatic with atypical atrial flutter at 160 bpm. I advised to treat in the ED for the high v rates of 160. In the ER, since he was fairly asymptomatic so he was given iv diltiazem  and sent out on 120 mg cardiam in combo with his metoprolol  that was reduced to 25 mg daily. I stopped Cardizem  back in early May as he felt he was having aches and pains with it. Since coming off drug, his symptoms did not go away.   In the office today, he presented with aflutter vrs coase afib in the 120'sbut went back into SR in the 50's by the end of the visit.   We discuss antiarrythmic's today vrs ablation. He would prefer ablation but then there is the question to do ablation first and delay shoulder surgery or undergo shoulder surgery with short term AAD on board  and schedule ablation  for the end of the year. He is on celexa  that is contraindicated with Multaq and 1st degree AV block would be an issue with flecainide. No h/o of CAD but no stress test has been done in the past.   Follow up 11/13/23. Patient returns for follow up for atrial fibrillation. He remains in SR and feels well. He stopped Xarelto  some time ago  as he thought it was contributing to his back pain. He has not had any interim symptoms of afib.   Today, he  denies symptoms of palpitations, chest pain, shortness of breath, orthopnea, PND, lower extremity edema, dizziness, presyncope, syncope, snoring, daytime somnolence, bleeding, or neurologic sequela. The patient is tolerating medications without difficulties and is otherwise without complaint today.    Past Medical History:  Diagnosis Date   Adult acne    Cancer (HCC) 07/2017   skin cancer   Chronic Pancreatitis 01/2023   Diabetes mellitus without complication (HCC)    Dyspnea    Gout    Hypertension    Hypothyroidism    Thyroid  disease     Current Outpatient Medications  Medication Sig Dispense Refill   acetaminophen  (TYLENOL ) 500 MG tablet Take  1,000 mg by mouth every 6 (six) hours as needed for mild pain.     allopurinol  (ZYLOPRIM ) 300 MG tablet Take 1 tablet (300 mg total) by mouth daily. 90 tablet 3   ALPRAZolam  (XANAX ) 0.25 MG tablet TAKE 1 TABLET BY MOUTH 2 TIMES DAILY AS NEEDED FOR ANXIETY. 20 tablet 2   Continuous Glucose Sensor (FREESTYLE LIBRE 3 PLUS SENSOR) MISC Change sensor every 15 days. 6 each 3   cyclobenzaprine  (FLEXERIL ) 10 MG tablet Take 1 tablet (10 mg total) by mouth at bedtime. 30 tablet 0   dicyclomine  (BENTYL ) 20 MG tablet Take 1 tablet (20 mg total) by mouth 4 (four) times daily -  before meals and at bedtime. 360 tablet 1   diltiazem  (CARDIZEM  SR) 120 MG 12 hr capsule Take 1 capsule (120 mg total) by mouth daily. 90 capsule 2   finasteride  (PROSCAR ) 5 MG tablet TAKE 1 TABLET (5 MG TOTAL) BY MOUTH DAILY. 90 tablet 3   insulin  aspart (FIASP  FLEXTOUCH) 100 UNIT/ML FlexTouch Pen Inject 4-7 Units into the skin 3 (three) times daily before meals. 25 mL 3   insulin  glargine (LANTUS ) 100 UNIT/ML Solostar Pen Inject 20 Units into the skin at bedtime. 18 mL 3   Insulin  Pen Needle (PEN NEEDLES) 31G X 5 MM MISC Use to inject insulin  4 times daily 300 each 3   levothyroxine  (SYNTHROID ) 150 MCG tablet Take 1 tablet (150 mcg total) by mouth daily. 90 tablet 1   lisinopril  (ZESTRIL ) 5 MG tablet TAKE 1 TABLET (5 MG TOTAL) BY MOUTH DAILY. 90 tablet 0   metoprolol  succinate (TOPROL -XL) 50 MG 24 hr tablet TAKE 1 TABLET BY MOUTH EVERY DAY 90 tablet 3   traMADol  (ULTRAM ) 50 MG tablet Take 1 tablet (50 mg total) by mouth every 8 (eight) hours as needed. 60 tablet 2   traZODone  (DESYREL ) 50 MG tablet TAKE 1 TABLET BY MOUTH EVERY DAY AT BEDTIME AS NEEDED FOR SLEEP 90 tablet 1   triamcinolone  lotion (KENALOG ) 0.1 % Apply 1 Application topically 2 (two) times daily as needed. (Patient taking differently: Apply 1 Application topically daily as needed.) 60 mL 2   triamterene -hydrochlorothiazide (MAXZIDE) 75-50 MG tablet Take 1 tablet by mouth  daily. 90 tablet 1   No current facility-administered medications for this encounter.    ROS- All systems are reviewed and negative except as per the HPI above  Physical Exam: Vitals:   11/13/23 1038  BP: 114/70  Pulse: (!) 58  Weight: 67.1 kg  Height: 5' 9 (1.753 m)   Wt Readings from Last 3 Encounters:  11/13/23 67.1 kg  09/08/23 67.1 kg  09/08/23 67.1 kg     GEN: Well nourished, well  developed in no acute distress CARDIAC: Regular rate and rhythm, no murmurs, rubs, gallops RESPIRATORY:  Clear to auscultation without rales, wheezing or rhonchi  ABDOMEN: Soft, non-tender, non-distended EXTREMITIES:  No edema; No deformity    EKG today demonstrates SB, PACs Vent. rate 58 BPM PR interval 196 ms QRS duration 90 ms QT/QTcB 420/412 ms   Echo 04/17/21  1. Left ventricular ejection fraction, by estimation, is 60 to 65%. The  left ventricle has normal function. The left ventricle has no regional  wall motion abnormalities. Left ventricular diastolic parameters are  indeterminate. Elevated left ventricular end-diastolic pressure. The E/e' is 17.8.   2. Right ventricular systolic function is normal. The right ventricular  size is normal. There is normal pulmonary artery systolic pressure. The  estimated right ventricular systolic pressure is 17.0 mmHg.   3. The mitral valve is grossly normal. Trivial mitral valve  regurgitation. No evidence of mitral stenosis.   4. The aortic valve is tricuspid. Aortic valve regurgitation is not  visualized. No aortic stenosis is present.   5. The inferior vena cava is normal in size with greater than 50%  respiratory variability, suggesting right atrial pressure of 3 mmHg.      CHA2DS2-VASc Score = 4  The patient's score is based upon: CHF History: 0 HTN History: 1 Diabetes History: 1 Stroke History: 0 Vascular Disease History: 0 Age Score: 2 Gender Score: 0       ASSESSMENT AND PLAN: Paroxysmal Atrial Fibrillation/atrial  flutter (ICD10:  I48.0) The patient's CHA2DS2-VASc score is 4, indicating a 4.8% annual risk of stroke.   Patient appears to be maintaining SR Continue diltiazem  120 mg daily Continue Toprol  50 mg daily Start dabigatran 150 mg BID  Secondary Hypercoagulable State (ICD10:  D68.69) The patient is at significant risk for stroke/thromboembolism based upon his CHA2DS2-VASc Score of 4.  We discussed his stroke risk and the guidelines for anticoagulation. Will Start Dabigatran (Pradaxa) 150 mg BID. Check bmet/cbc in one month.    HTN Stable on current regimen   Follow up with Dr Inocencio in one year.     [name] PA-C [facility] [address] [place], [place] [address] [phone]

## 2023-11-23 DIAGNOSIS — L570 Actinic keratosis: Secondary | ICD-10-CM | POA: Diagnosis not present

## 2023-11-23 DIAGNOSIS — C44329 Squamous cell carcinoma of skin of other parts of face: Secondary | ICD-10-CM | POA: Diagnosis not present

## 2023-11-23 DIAGNOSIS — Z85828 Personal history of other malignant neoplasm of skin: Secondary | ICD-10-CM | POA: Diagnosis not present

## 2023-12-08 ENCOUNTER — Other Ambulatory Visit: Payer: Self-pay | Admitting: Adult Health

## 2023-12-08 DIAGNOSIS — I10 Essential (primary) hypertension: Secondary | ICD-10-CM

## 2023-12-20 ENCOUNTER — Other Ambulatory Visit (HOSPITAL_COMMUNITY): Payer: Self-pay | Admitting: Cardiology

## 2023-12-23 ENCOUNTER — Encounter: Payer: Self-pay | Admitting: Adult Health

## 2023-12-23 DIAGNOSIS — I480 Paroxysmal atrial fibrillation: Secondary | ICD-10-CM | POA: Diagnosis not present

## 2023-12-23 DIAGNOSIS — R109 Unspecified abdominal pain: Secondary | ICD-10-CM

## 2023-12-23 DIAGNOSIS — R197 Diarrhea, unspecified: Secondary | ICD-10-CM

## 2023-12-23 MED ORDER — DICYCLOMINE HCL 20 MG PO TABS: 20.0000 mg | ORAL_TABLET | Freq: Three times a day (TID) | ORAL | 1 refills | Status: DC

## 2023-12-24 ENCOUNTER — Ambulatory Visit (HOSPITAL_COMMUNITY): Payer: Self-pay | Admitting: Physician Assistant

## 2023-12-24 LAB — COMPREHENSIVE METABOLIC PANEL WITH GFR
ALT: 108 IU/L — ABNORMAL HIGH (ref 0–44)
AST: 43 IU/L — ABNORMAL HIGH (ref 0–40)
Albumin: 4.5 g/dL (ref 3.8–4.8)
Alkaline Phosphatase: 89 IU/L (ref 44–121)
BUN/Creatinine Ratio: 30 — ABNORMAL HIGH (ref 10–24)
BUN: 26 mg/dL (ref 8–27)
Bilirubin Total: 0.3 mg/dL (ref 0.0–1.2)
CO2: 21 mmol/L (ref 20–29)
Calcium: 9.4 mg/dL (ref 8.6–10.2)
Chloride: 102 mmol/L (ref 96–106)
Creatinine, Ser: 0.88 mg/dL (ref 0.76–1.27)
Globulin, Total: 1.8 g/dL (ref 1.5–4.5)
Glucose: 133 mg/dL — ABNORMAL HIGH (ref 70–99)
Potassium: 4.2 mmol/L (ref 3.5–5.2)
Sodium: 139 mmol/L (ref 134–144)
Total Protein: 6.3 g/dL (ref 6.0–8.5)
eGFR: 89 mL/min/1.73 (ref >59)

## 2023-12-24 LAB — BASIC METABOLIC PANEL WITH GFR
BUN/Creatinine Ratio: 27 — ABNORMAL HIGH (ref 10–24)
BUN: 26 mg/dL (ref 8–27)
CO2: 21 mmol/L (ref 20–29)
Calcium: 9.9 mg/dL (ref 8.6–10.2)
Chloride: 103 mmol/L (ref 96–106)
Creatinine, Ser: 0.96 mg/dL (ref 0.76–1.27)
Glucose: 137 mg/dL — ABNORMAL HIGH (ref 70–99)
Potassium: 4.6 mmol/L (ref 3.5–5.2)
Sodium: 140 mmol/L (ref 134–144)
eGFR: 82 mL/min/{1.73_m2}

## 2023-12-24 LAB — TSH: TSH: 0.229 u[IU]/mL — ABNORMAL LOW (ref 0.450–4.500)

## 2023-12-24 LAB — CBC
Hematocrit: 45.5 % (ref 37.5–51.0)
Hemoglobin: 15.2 g/dL (ref 13.0–17.7)
MCH: 32.8 pg (ref 26.6–33.0)
MCHC: 33.4 g/dL (ref 31.5–35.7)
MCV: 98 fL — ABNORMAL HIGH (ref 79–97)
Platelets: 232 10*3/uL (ref 150–450)
RBC: 4.64 x10E6/uL (ref 4.14–5.80)
RDW: 12.4 % (ref 11.6–15.4)
WBC: 6.5 10*3/uL (ref 3.4–10.8)

## 2023-12-24 LAB — T4, FREE: Free T4: 1.64 ng/dL (ref 0.82–1.77)

## 2023-12-28 DIAGNOSIS — C44329 Squamous cell carcinoma of skin of other parts of face: Secondary | ICD-10-CM | POA: Diagnosis not present

## 2023-12-29 ENCOUNTER — Ambulatory Visit: Admitting: Nurse Practitioner

## 2023-12-29 ENCOUNTER — Encounter: Payer: Self-pay | Admitting: Nurse Practitioner

## 2023-12-29 VITALS — BP 102/60 | HR 58 | Ht 69.0 in | Wt 149.0 lb

## 2023-12-29 DIAGNOSIS — E890 Postprocedural hypothyroidism: Secondary | ICD-10-CM | POA: Diagnosis not present

## 2023-12-29 DIAGNOSIS — E039 Hypothyroidism, unspecified: Secondary | ICD-10-CM

## 2023-12-29 DIAGNOSIS — Z794 Long term (current) use of insulin: Secondary | ICD-10-CM | POA: Diagnosis not present

## 2023-12-29 DIAGNOSIS — E1165 Type 2 diabetes mellitus with hyperglycemia: Secondary | ICD-10-CM | POA: Diagnosis not present

## 2023-12-29 DIAGNOSIS — E109 Type 1 diabetes mellitus without complications: Secondary | ICD-10-CM

## 2023-12-29 LAB — POCT GLYCOSYLATED HEMOGLOBIN (HGB A1C): Hemoglobin A1C: 6.2 % — AB (ref 4.0–5.6)

## 2023-12-29 LAB — POCT UA - MICROALBUMIN: Microalbumin Ur, POC: 30 mg/L

## 2023-12-29 MED ORDER — LEVOTHYROXINE SODIUM 150 MCG PO TABS: 150.0000 ug | ORAL_TABLET | Freq: Every day | ORAL | 1 refills | Status: AC

## 2023-12-29 NOTE — Progress Notes (Signed)
 Endocrinology Follow Up Note       12/29/2023, 10:48 AM   Subjective:    Patient ID: Jose Evans, male    DOB: 1946-01-07.  Jose Evans is being seen in follow up after being seen in consultation for management of currently uncontrolled symptomatic diabetes requested by  Merna Huxley, NP.   Past Medical History:  Diagnosis Date   Adult acne    Cancer (HCC) 07/2017   skin cancer   Chronic Pancreatitis 01/2023   Diabetes mellitus without complication (HCC)    Dyspnea    Gout    Hypertension    Hypothyroidism    Thyroid  disease     Past Surgical History:  Procedure Laterality Date   HERNIA REPAIR  2007   x2   PAROTIDECTOMY Left 10/07/2022   Procedure: LEFT PAROTIDECTOMY;  Surgeon: Carlie Clark, MD;  Location: [facility];  Service: ENT;  Laterality: Left;   SHOULDER SURGERY Right 2018   TONSILLECTOMY     age 78    Social History   Socioeconomic History   Marital status: Married    Spouse name: Not on file   Number of children: Not on file   Years of education: Not on file   Highest education level: Associate degree: occupational, Scientist, product/process development, or vocational program  Occupational History   Not on file  Tobacco Use   Smoking status: Former    Current packs/day: 0.00    Average packs/day: 0.5 packs/day for 30.0 years (15.0 ttl pk-yrs)    Types: Cigarettes    Start date: 09/08/1975    Quit date: 09/07/2005    Years since quitting: 18.3   Smokeless tobacco: Never   Tobacco comments:    Former smoker 11/13/23  Vaping Use   Vaping status: Never Used  Substance and Sexual Activity   Alcohol use: Not Currently   Drug use: No   Sexual activity: Not on file  Other Topics Concern   Not on file  Social History Narrative   Works for [employer]    Married   m   Social Drivers of Corporate investment banker Strain: Low Risk  (08/19/2023)   Overall Financial Resource  Strain (CARDIA)    Difficulty of Paying Living Expenses: Not very hard  Food Insecurity: No Food Insecurity (08/19/2023)   Hunger Vital Sign    Worried About Running Out of Food in the Last Year: Never true    Ran Out of Food in the Last Year: Never true  Transportation Needs: No Transportation Needs (08/19/2023)   PRAPARE - Administrator, Civil Service (Medical): No    Lack of Transportation (Non-Medical): No  Physical Activity: Insufficiently Active (08/19/2023)   Exercise Vital Sign    Days of Exercise per Week: 2 days    Minutes of Exercise per Session: 30 min  Stress: No Stress Concern Present (08/19/2023)   [employer] of Occupational Health - Occupational Stress Questionnaire    Feeling of Stress : Only a little  Social Connections: Moderately Isolated (08/19/2023)   Social Connection and Isolation Panel    Frequency of Communication with Friends and Family: More than three times a week  Frequency of Social Gatherings with Friends and Family: Once a week    Attends Religious Services: Never    Database administrator or Organizations: No    Attends Engineer, structural: Not on file    Marital Status: Married    Family History  Problem Relation Age of Onset   Hypertension Other    Crohn's disease Sister    Colon cancer Neg Hx    Rectal cancer Neg Hx    Stomach cancer Neg Hx    Esophageal cancer Neg Hx     Outpatient Encounter Medications as of 12/29/2023  Medication Sig   acetaminophen  (TYLENOL ) 500 MG tablet Take 1,000 mg by mouth every 6 (six) hours as needed for mild pain.   allopurinol  (ZYLOPRIM ) 300 MG tablet Take 1 tablet (300 mg total) by mouth daily.   ALPRAZolam  (XANAX ) 0.25 MG tablet TAKE 1 TABLET BY MOUTH 2 TIMES DAILY AS NEEDED FOR ANXIETY.   Continuous Glucose Sensor (FREESTYLE LIBRE 3 PLUS SENSOR) MISC Change sensor every 15 days.   cyclobenzaprine  (FLEXERIL ) 10 MG tablet Take 1 tablet (10 mg total) by mouth at bedtime.   dabigatran   (PRADAXA ) 150 MG CAPS capsule Take 1 capsule (150 mg total) by mouth 2 (two) times daily.   dicyclomine  (BENTYL ) 20 MG tablet Take 1 tablet (20 mg total) by mouth 4 (four) times daily -  before meals and at bedtime.   diltiazem  (CARDIZEM  SR) 120 MG 12 hr capsule Take 1 capsule (120 mg total) by mouth daily.   finasteride  (PROSCAR ) 5 MG tablet TAKE 1 TABLET (5 MG TOTAL) BY MOUTH DAILY.   insulin  aspart (FIASP  FLEXTOUCH) 100 UNIT/ML FlexTouch Pen Inject 4-7 Units into the skin 3 (three) times daily before meals.   insulin  glargine (LANTUS ) 100 UNIT/ML Solostar Pen Inject 20 Units into the skin at bedtime. (Patient taking differently: Inject 12 Units into the skin at bedtime.)   Insulin  Pen Needle (PEN NEEDLES) 31G X 5 MM MISC Use to inject insulin  4 times daily   lisinopril  (ZESTRIL ) 5 MG tablet TAKE 1 TABLET (5 MG TOTAL) BY MOUTH DAILY.   metoprolol  succinate (TOPROL -XL) 50 MG 24 hr tablet TAKE 1 TABLET BY MOUTH EVERY DAY   traMADol  (ULTRAM ) 50 MG tablet Take 1 tablet (50 mg total) by mouth every 8 (eight) hours as needed.   traZODone  (DESYREL ) 50 MG tablet TAKE 1 TABLET BY MOUTH EVERY DAY AT BEDTIME AS NEEDED FOR SLEEP   triamcinolone  lotion (KENALOG ) 0.1 % Apply 1 Application topically 2 (two) times daily as needed. (Patient taking differently: Apply 1 Application topically daily as needed.)   triamterene -hydrochlorothiazide (MAXZIDE) 75-50 MG tablet TAKE 1 TABLET BY MOUTH EVERY DAY   [DISCONTINUED] levothyroxine  (SYNTHROID ) 150 MCG tablet Take 1 tablet (150 mcg total) by mouth daily.   levothyroxine  (SYNTHROID ) 150 MCG tablet Take 1 tablet (150 mcg total) by mouth daily.   No facility-administered encounter medications on file as of 12/29/2023.    ALLERGIES: Allergies  Allergen Reactions   Actos  [Pioglitazone ] Diarrhea   Glipizide      Abdominal pain    Glucophage  [Metformin ] Diarrhea   Jardiance  [Empagliflozin ]     Abdominal pain     VACCINATION STATUS: Immunization History   Administered Date(s) Administered   Fluad Quad(high Dose 65+) 12/09/2018, 01/26/2020   Fluad Trivalent(High Dose 65+) 01/16/2023   INFLUENZA, HIGH DOSE SEASONAL PF 03/13/2014, 05/28/2015, 12/30/2017, 02/22/2021   Influenza Whole 03/05/2010   Influenza,inj,Quad PF,6+ Mos 02/09/2013   Influenza-Unspecified 02/22/2021  PFIZER Comirnaty(Gray Top)Covid-19 Tri-Sucrose Vaccine 09/06/2020   PFIZER(Purple Top)SARS-COV-2 Vaccination 06/17/2019, 07/12/2019, 01/20/2020   Pneumococcal Conjugate-13 05/28/2015   Pneumococcal Polysaccharide-23 06/25/2016   Tdap 06/16/2011    Diabetes He presents for his follow-up diabetic visit. He has type 2 diabetes mellitus. Onset time: diagnosed at approx age of 17. His disease course has been stable. Hypoglycemia symptoms include nervousness/anxiousness, sweats and tremors. Pertinent negatives for diabetes include no fatigue, no polydipsia, no polyuria and no weight loss. There are no hypoglycemic complications. Symptoms are improving. (Chronic pancreatitis) Risk factors for coronary artery disease include diabetes mellitus, male sex, tobacco exposure and family history. Current diabetic treatment includes diet and intensive insulin  program. He is compliant with treatment most of the time. His weight is fluctuating minimally. He is following a generally healthy diet. Meal planning includes avoidance of concentrated sweets. He has had a previous visit with a dietitian (sees [place], RDE). He participates in exercise intermittently. His home blood glucose trend is fluctuating minimally. (He presents today with his CGM showing at goal glycemic profile overall.  His POCT A1c today is 6.2%, unchanged from last visit.   Analysis of his CGM shows TIR 73%, TAR 26%, TBR 1% with a GMI of 6.8%.  ) An ACE inhibitor/angiotensin II receptor blocker is being taken. He does not see a podiatrist.Eye exam is current.   He was first diagnosed with thyroid  problems over 20 years ago.  He had the  Flu and lost about 20 lbs within a few weeks time.  He started developing symptoms with high resting heart rate, diaphoresis, tremors and he was taken to his PCP then transferred to the hospital.  He was about to go in for cardiac surgery but insisted thyroid  labs be checked, which showed significant hyperthyroidism.  He did have RAI ablation for this and has since been put on Levothyroxine .  He notes he has bounced around between 150-175 mcg until just recently where his dose was decreased to 150 mcg po daily.  He notes about 3 weeks ago, he read the package insert on instructions for proper administration and has since started taking it properly (empty stomach, waiting at least 30 minutes prior to eating or taking other meds, etc).   He denies any family history of thyroid  disease, has never had any dedicated imaging of his thyroid  since the initial diagnosis.  Review of systems  Constitutional: + stable body weight, current Body mass index is 22 kg/m., no fatigue, no subjective hyperthermia, no subjective hypothermia Eyes: no blurry vision, no xerophthalmia ENT: no sore throat, no nodules palpated in throat, no dysphagia/odynophagia, no hoarseness Cardiovascular: no chest pain, no shortness of breath, no palpitations (hx of afib- last episode over a year ago), no leg swelling Respiratory: no cough, no shortness of breath Gastrointestinal: no nausea/vomiting, + chronic diarrhea- improved on pancreatic enzymes Musculoskeletal: chronic left shoulder pain following fall 2 years ago where he tore rotator cuff Skin: no rashes, no hyperemia Neurological: + intermittent tremors, no numbness, no tingling, no dizziness Psychiatric: no depression, no anxiety,   Objective:     BP 102/60 (BP Location: Left Arm, Patient Position: Sitting, Cuff Size: Large)   Pulse (!) 58   Ht 5' 9 (1.753 m)   Wt 149 lb (67.6 kg)   BMI 22.00 kg/m   Wt Readings from Last 3 Encounters:  12/29/23 149 lb (67.6 kg)   11/13/23 148 lb (67.1 kg)  09/08/23 148 lb (67.1 kg)     BP Readings from Last 3  Encounters:  12/29/23 102/60  11/13/23 114/70  09/08/23 104/62     Physical Exam- Limited  Constitutional:  Body mass index is 22 kg/m. , not in acute distress, normal state of mind Eyes:  EOMI, no exophthalmos Musculoskeletal: no gross deformities, strength intact in all four extremities, no gross restriction of joint movements Skin:  no rashes, no hyperemia   Diabetic Foot Exam - Simple   Simple Foot Form Diabetic Foot exam was performed with the following findings: Yes 12/29/2023 10:17 AM  Visual Inspection No deformities, no ulcerations, no other skin breakdown bilaterally: Yes Sensation Testing Intact to touch and monofilament testing bilaterally: Yes Pulse Check Posterior Tibialis and Dorsalis pulse intact bilaterally: Yes Comments Calluses bilaterally      CMP ( most recent) CMP     Component Value Date/Time   NA 139 12/23/2023 1047   K 4.2 12/23/2023 1047   CL 102 12/23/2023 1047   CO2 21 12/23/2023 1047   GLUCOSE 133 (H) 12/23/2023 1047   GLUCOSE 244 (H) 04/20/2023 1335   BUN 26 12/23/2023 1047   CREATININE 0.88 12/23/2023 1047   CREATININE 1.14 01/26/2020 1338   CALCIUM 9.4 12/23/2023 1047   PROT 6.3 12/23/2023 1047   ALBUMIN 4.5 12/23/2023 1047   AST 43 (H) 12/23/2023 1047   ALT 108 (H) 12/23/2023 1047   ALKPHOS 89 12/23/2023 1047   BILITOT 0.3 12/23/2023 1047   GFR 77.32 04/20/2023 1335   EGFR 89 12/23/2023 1047   GFRNONAA >60 10/01/2022 1304   GFRNONAA 64 01/26/2020 1338     Diabetic Labs (most recent): Lab Results  Component Value Date   HGBA1C 6.2 (A) 12/29/2023   HGBA1C 6.2 (A) 09/08/2023   HGBA1C 7.9 (A) 06/09/2023   MICROALBUR 30 mg/L 12/29/2023     Lipid Panel ( most recent) Lipid Panel     Component Value Date/Time   CHOL 155 02/28/2021 0833   TRIG 138.0 02/28/2021 0833   HDL 39.20 02/28/2021 0833   CHOLHDL 4 02/28/2021 0833   VLDL 27.6  02/28/2021 0833   LDLCALC 88 02/28/2021 0833   LDLCALC 122 (H) 01/26/2020 1338      Lab Results  Component Value Date   TSH 0.229 (L) 12/23/2023   TSH 0.202 (L) 09/04/2023   TSH 0.13 (L) 04/20/2023   TSH 10.06 (H) 03/17/2023   TSH 0.93 10/10/2022   TSH 1.09 05/30/2022   TSH 0.08 (L) 04/23/2022   TSH 0.098 (L) 10/15/2021   TSH 0.43 02/28/2021   TSH 1.01 03/01/2020   FREET4 1.64 12/23/2023   FREET4 1.63 09/04/2023   FREET4 1.9 (H) 03/01/2020   FREET4 1.07 06/23/2019   FREET4 1.27 05/20/2010           Assessment & Plan:   1) Type 2 diabetes mellitus with hyperglycemia, with long-term current use of insulin  (HCC) (Primary)  He presents today with his CGM showing at goal glycemic profile overall.  His POCT A1c today is 6.2%, unchanged from last visit.   Analysis of his CGM shows TIR 73%, TAR 26%, TBR 1% with a GMI of 6.8%.    - Jose Evans has currently controlled symptomatic type 2 (Managed as like type 1 due to chronic pancreatitis) DM since 78 years of age.   He does have history of chronic pancreatitis and EPI on Creon .  His diabetes may be insulin  dependent based on this.  His GAD and insulin  antibodies were negative ruling out LADA but his insulin  and cpeptide levels were low indicating his  pancreas is not producing the amount of insulin  his body needs, thus he will be treated as type 1.  -Recent labs reviewed.  LFTs were elevated but improving from previous check.  He follows GI for this.  - I had a long discussion with him about the progressive nature of diabetes and the pathology behind its complications. -his diabetes is complicated by chronic pancreatitis and he remains at a high risk for more acute and chronic complications which include CAD, CVA, CKD, retinopathy, and neuropathy. These are all discussed in detail with him.  The following Lifestyle Medicine recommendations according to American College of Lifestyle Medicine [facility]) were discussed and offered to  patient and he agrees to start the journey:  A. Whole Foods, Plant-based plate comprising of fruits and vegetables, plant-based proteins, whole-grain carbohydrates was discussed in detail with the patient.   A list for source of those nutrients were also provided to the patient.  Patient will use only water or unsweetened tea for hydration. B.  The need to stay away from risky substances including alcohol, smoking; obtaining 7 to 9 hours of restorative sleep, at least 150 minutes of moderate intensity exercise weekly, the importance of healthy social connections,  and stress reduction techniques were discussed. C.  A full color page of  Calorie density of various food groups per pound showing examples of each food groups was provided to the patient.  - Nutritional counseling repeated at each appointment.  - The patient admits there is a room for improvement in their diet and drink choices. -  Suggestion is made for the patient to avoid simple carbohydrates from their diet including Cakes, Sweet Desserts / Pastries, Ice Cream, Soda (diet and regular), Sweet Tea, Candies, Chips, Cookies, Sweet Pastries, Store Bought Juices, Alcohol in Excess of 1-2 drinks a day, Artificial Sweeteners, Coffee Creamer, and Sugar-free Products. This will help patient to have stable blood glucose profile and potentially avoid unintended weight gain.   - I encouraged the patient to switch to unprocessed or minimally processed complex starch and increased protein intake (animal or plant source), fruits, and vegetables.   - Patient is advised to stick to a routine mealtimes to eat 3 meals a day and avoid unnecessary snacks (to snack only to correct hypoglycemia).  - he is following with Penny Crumpton, RDN, CDE for diabetes education.  - I have approached him with the following individualized plan to manage his diabetes and patient agrees:   -His diabetes will be managed as type 1, likely from damage to pancreas from  chronic inflammation.  -He is advised to continue his Lantus  12 units SQ nightly and continue his Fiasp  4-7 units TID with meals if glucose above 70 and he is eating (Specific instructions on how to titrate insulin  dosage based on glucose readings given to patient in writing).  -he is encouraged to continue monitoring glucose 4 times daily (using his CGM), before meals and before bed, and to call the clinic if he has readings less than 70 or above 200 for 3 tests in a row.  - he is warned not to take insulin  without proper monitoring per orders. - Adjustment parameters are given to him for hypo and hyperglycemia in writing.  -He reportedly did not tolerate Metformin , Jardiance , Actos , or Glipizide  in the past.  - he is not an ideal candidate for incretin therapy given body habitus and BMI <25.  - Specific targets for  A1c; LDL, HDL, and Triglycerides were discussed with the patient.  2) Blood Pressure /Hypertension:  his blood pressure is controlled to target.   he is advised to continue his current medications as prescribed by PCP.  3) Lipids/Hyperlipidemia:    His previsit lipid panel from 02/28/21 shows controlled LDL of 88.  He is not on any lipid lowering medications at this time.  4)  Weight/Diet:  his Body mass index is 22 kg/m.  -     he is NOT a candidate for weight loss.  Exercise, and detailed carbohydrates information provided  -  detailed on discharge instructions.  5) Chronic Care/Health Maintenance: -he is on ACEI/ARB and not on Statin medications and is encouraged to initiate and continue to follow up with Ophthalmology, Dentist, Podiatrist at least yearly or according to recommendations, and advised to stay away from smoking. I have recommended yearly flu vaccine and pneumonia vaccine at least every 5 years; moderate intensity exercise for up to 150 minutes weekly; and sleep for at least 7 hours a day.  6) Hypothyroidism-RAI induced Had RAI for hyperthyroidism 20+ years  ago.  He is currently on Levothyroxine  150 mcg po daily before breakfast which is significantly higher than his weight based maximum dose of around 106 mcg.   Antibody testing was negative, ruling out autoimmune thyroid  dysfunction.  His previsit TFTs are consistent with appropriate hormone replacement.  TSH is slightly suppressed but Free T4 is normal and he denies any overt symptoms of over-replacement.  Thus he is advised to continue Levothyroxine  150 mcg po daily for now.  The consistent use of Creon  may help with absorption of this medication, thus we may need to lower in the future.     - The correct intake of thyroid  hormone (Levothyroxine , Synthroid ), is on empty stomach first thing in the morning, with water, separated by at least 30 minutes from breakfast and other medications,  and separated by more than 4 hours from calcium, iron, multivitamins, acid reflux medications (PPIs).  - This medication is a life-long medication and will be needed to correct thyroid  hormone imbalances for the rest of your life.  The dose may change from time to time, based on thyroid  blood work.  - It is extremely important to be consistent taking this medication, near the same time each morning.  -AVOID TAKING PRODUCTS CONTAINING BIOTIN (commonly found in Hair, Skin, Nails vitamins) AS IT INTERFERES WITH THE VALIDITY OF THYROID  FUNCTION BLOOD TESTS.  7) Vitamin D  deficiency His recent vitamin d  level was low at 21.1.  He did start OTC multivitamin with Vitamin D  in it.  I advised him he needs to take 5000 units of Vitamin D3 daily.  Will plan to recheck on subsequent visits.    - he is advised to maintain close follow up with Nafziger, Cory, NP for primary care needs, as well as his other providers for optimal and coordinated care.     I spent  46  minutes in the care of the patient today including review of labs from CMP, Lipids, Thyroid  Function, Hematology (current and previous including abstractions  from other facilities); face-to-face time discussing  his blood glucose readings/logs, discussing hypoglycemia and hyperglycemia episodes and symptoms, medications doses, his options of short and long term treatment based on the latest standards of care / guidelines;  discussion about incorporating lifestyle medicine;  and documenting the encounter. Risk reduction counseling performed per USPSTF guidelines to reduce obesity and cardiovascular risk factors.     Please refer to Patient Instructions for Blood Glucose Monitoring and Insulin /Medications  Dosing Guide  in media tab for additional information. Please  also refer to  Patient Self Inventory in the Media  tab for reviewed elements of pertinent patient history.  Curtistine Sor participated in the discussions, expressed understanding, and voiced agreement with the above plans.  All questions were answered to his satisfaction. he is encouraged to contact clinic should he have any questions or concerns prior to his return visit.     Follow up plan: - Return in about 4 months (around 04/29/2024) for Diabetes F/U- A1c and UM in office, No previsit labs, Bring meter and logs.   [place]enton Rio, [facility] [address] [place], [place] [address] Phone: [phone] Fax: [phone]  12/29/2023, 10:48 AM

## 2024-01-14 ENCOUNTER — Other Ambulatory Visit: Payer: Self-pay | Admitting: Cardiology

## 2024-01-19 ENCOUNTER — Telehealth: Payer: Self-pay

## 2024-01-19 NOTE — Progress Notes (Signed)
   01/19/2024  Patient ID: Jose Evans, male   DOB: 08/21/1945, 78 y.o.   MRN: [id number]  Pharmacy Quality Measure Review  Statin Use in Persons with Diabetes (SUPD)  Patient appearing as at risk for failing the SUPD metric. Chart review shows no history of statin and past due for updated lipid panel/PCP follow up.  Contacted patient via phone. He has a PCP visit for next week, patient aware that lipid panel likely to be ordered and to come fasting. Would recommend starting statin if LDL >70 and if PCP deems appropriate.  Jon VEAR Lindau, PharmD Clinical Pharmacist [phone]

## 2024-01-21 ENCOUNTER — Encounter: Payer: Self-pay | Admitting: Cardiology

## 2024-01-21 ENCOUNTER — Telehealth: Payer: Self-pay | Admitting: Cardiology

## 2024-01-21 MED ORDER — METOPROLOL SUCCINATE ER 50 MG PO TB24: 50.0000 mg | ORAL_TABLET | Freq: Every day | ORAL | 2 refills | Status: AC

## 2024-01-21 NOTE — Telephone Encounter (Signed)
 Ok to refill under [employer].  Ok to refill for a year

## 2024-01-21 NOTE — Telephone Encounter (Signed)
*  STAT* If patient is at the pharmacy, call can be transferred to refill team.   1. Which medications need to be refilled? (please list name of each medication and dose if known) metoprolol  succinate (TOPROL -XL) 50 MG 24 hr tablet    2. Would you like to learn more about the convenience, safety, & potential cost savings by using the [facility]?      3. Are you open to using the [facility] (Type [facility].  ).   4. Which pharmacy/location (including street and city if local pharmacy) is medication to be sent to? [id number] - [place], [place] - [address]     5. Do they need a 30 day or 90 day supply? 90 Per [name] in [name] clinic patient doesn't need to f/u with [facility] until August of 2026.

## 2024-01-21 NOTE — Telephone Encounter (Signed)
 Pt of Dr. Inocencio. Last ov with Dr. Inocencio was on 11/18/22. At last ov in 10/2023 with [name], [name] PA said to F/U with Dr. Inocencio in one year. Does Dr. Inocencio want to refill for a year or does he want to see the Pt for a refill. Please advise.

## 2024-01-22 ENCOUNTER — Encounter: Payer: Self-pay | Admitting: Adult Health

## 2024-01-22 ENCOUNTER — Ambulatory Visit (INDEPENDENT_AMBULATORY_CARE_PROVIDER_SITE_OTHER): Admitting: Adult Health

## 2024-01-22 VITALS — BP 100/70 | HR 48 | Temp 97.6°F | Ht 67.0 in | Wt 148.0 lb

## 2024-01-22 DIAGNOSIS — I10 Essential (primary) hypertension: Secondary | ICD-10-CM

## 2024-01-22 DIAGNOSIS — I480 Paroxysmal atrial fibrillation: Secondary | ICD-10-CM

## 2024-01-22 DIAGNOSIS — Z0000 Encounter for general adult medical examination without abnormal findings: Secondary | ICD-10-CM

## 2024-01-22 DIAGNOSIS — K861 Other chronic pancreatitis: Secondary | ICD-10-CM | POA: Diagnosis not present

## 2024-01-22 DIAGNOSIS — F5101 Primary insomnia: Secondary | ICD-10-CM | POA: Diagnosis not present

## 2024-01-22 DIAGNOSIS — Z794 Long term (current) use of insulin: Secondary | ICD-10-CM | POA: Diagnosis not present

## 2024-01-22 DIAGNOSIS — N401 Enlarged prostate with lower urinary tract symptoms: Secondary | ICD-10-CM | POA: Diagnosis not present

## 2024-01-22 DIAGNOSIS — E1169 Type 2 diabetes mellitus with other specified complication: Secondary | ICD-10-CM

## 2024-01-22 DIAGNOSIS — M10079 Idiopathic gout, unspecified ankle and foot: Secondary | ICD-10-CM | POA: Diagnosis not present

## 2024-01-22 DIAGNOSIS — R351 Nocturia: Secondary | ICD-10-CM | POA: Diagnosis not present

## 2024-01-22 DIAGNOSIS — E039 Hypothyroidism, unspecified: Secondary | ICD-10-CM

## 2024-01-22 DIAGNOSIS — F419 Anxiety disorder, unspecified: Secondary | ICD-10-CM | POA: Diagnosis not present

## 2024-01-22 LAB — MICROALBUMIN / CREATININE URINE RATIO
Creatinine,U: 77.8 mg/dL
Microalb Creat Ratio: 18.9 mg/g (ref 0.0–30.0)
Microalb, Ur: 1.5 mg/dL (ref 0.0–1.9)

## 2024-01-22 LAB — LIPID PANEL
Cholesterol: 106 mg/dL (ref 0–200)
HDL: 43.1 mg/dL (ref >39.00)
LDL Cholesterol: 43 mg/dL (ref 0–99)
NonHDL: 62.69
Total CHOL/HDL Ratio: 2
Triglycerides: 97 mg/dL (ref 0.0–149.0)
VLDL: 19.4 mg/dL (ref 0.0–40.0)

## 2024-01-22 LAB — PSA: PSA: 2.86 ng/mL (ref 0.10–4.00)

## 2024-01-22 LAB — URIC ACID: Uric Acid, Serum: 4.9 mg/dL (ref 4.0–7.8)

## 2024-01-22 LAB — TSH: TSH: 0.21 u[IU]/mL — ABNORMAL LOW (ref 0.35–5.50)

## 2024-01-22 NOTE — Progress Notes (Signed)
 Subjective:    Patient ID: Jose Evans, male    DOB: [birth year], 78 y.o.   MRN: [id number]  HPI Patient presents for yearly preventative medicine examination. He is a pleasant 78 year old male who  has a past medical history of Adult acne, Cancer (HCC) (07/2017), Chronic Pancreatitis (01/2023), Diabetes mellitus without complication (HCC), Dyspnea, Gout, Hypertension, Hypothyroidism, and Thyroid  disease.  DM Type 2 - managed by endocrinology ( as type 1 due to chronic pancreatitis), he is currently prescribed Lantus  12 units nightly and Fiasp  4-7 units 3 times daily with meals with glucose above 70.  He does monitor his blood sugars with CGM.  His last A1c month ago was 6.2, this is unchanged from last visit.  Hypothyroidism - managed with Synthroid  150 mcg daily.  Lab Results  Component Value Date   TSH 0.229 (L) 12/23/2023   Chronic Pancreatitis - Taking Bentyl  PRNand OTC Zenpep  daily. Has not followed up with GI since January 2025. He has significant gas and intermittent episodes with abdominal pain.   Paroxysmal Atrial Fibrillation -managed by the [name] clinic.  He did wear a monitor in October 2024 which showed 3% [name] burden as well as SVT.  He is currently prescribed Pradaxa  150 mg twice daily for anticoagulant patient ( reports that he is not taking)continues on Toprol  50 mg daily and Cardizem  120 mg daily? for rate control.   Hypertension -managed with lisinopril  5 mg daily, Maxide 75-50 mg and metoprolol  XL 50 mg daily. He denies dizziness, lightheadedness, blurred vision or headaches BP Readings from Last 3 Encounters:  01/22/24 100/70  12/29/23 102/60  11/13/23 114/70   Insomnia - Managed with Trazadone 50 mg at bedtime PRN  BPH - managed with Proscar  5 mg daily.   Gout - managed with Allopurinol  300 mg daily.   Anxiety -takes Xanax  0.25 mg BID PRN  Osteoarthritis - takes Tramadol  PRN but mostly takes Tylenol    All immunizations and health maintenance protocols were  reviewed with the patient and needed orders were placed.  Appropriate screening laboratory values were ordered for the patient including screening of hyperlipidemia, renal function and hepatic function. If indicated by BPH, a PSA was ordered.  Medication reconciliation,  past medical history, social history, problem list and allergies were reviewed in detail with the patient  Goals were established with regard to weight loss, exercise, and  diet in compliance with medications  He had a positive cologuard in the past - refuses to have colonoscopy.   Wt Readings from Last 10 Encounters:  01/22/24 148 lb (67.1 kg)  12/29/23 149 lb (67.6 kg)  11/13/23 148 lb (67.1 kg)  09/08/23 148 lb (67.1 kg)  09/08/23 148 lb (67.1 kg)  08/19/23 147 lb (66.7 kg)  06/16/23 146 lb (66.2 kg)  06/09/23 146 lb 12.8 oz (66.6 kg)  05/20/23 141 lb (64 kg)  05/12/23 147 lb 9.6 oz (67 kg)   Review of Systems  Constitutional:  Positive for appetite change.  HENT: Negative.    Eyes: Negative.   Respiratory: Negative.    Cardiovascular: Negative.   Gastrointestinal:  Positive for abdominal pain.  Endocrine: Negative.   Genitourinary: Negative.   Musculoskeletal:  Positive for arthralgias.  Skin:  Positive for color change.  Allergic/Immunologic: Negative.   Neurological: Negative.   Hematological: Negative.   Psychiatric/Behavioral:  Positive for sleep disturbance.   All other systems reviewed and are negative.  Past Medical History:  Diagnosis Date   Adult acne    Cancer (  HCC) 07/2017   skin cancer   Chronic Pancreatitis 01/2023   Diabetes mellitus without complication (HCC)    Dyspnea    Gout    Hypertension    Hypothyroidism    Thyroid  disease     Social History   Socioeconomic History   Marital status: Married    Spouse name: Not on file   Number of children: Not on file   Years of education: Not on file   Highest education level: Associate degree: occupational, Scientist, product/process development, or  vocational program  Occupational History   Not on file  Tobacco Use   Smoking status: Former    Current packs/day: 0.00    Average packs/day: 0.5 packs/day for 30.0 years (15.0 ttl pk-yrs)    Types: Cigarettes    Start date: 09/08/1975    Quit date: 09/07/2005    Years since quitting: 18.3   Smokeless tobacco: Never   Tobacco comments:    Former smoker 11/13/23  Vaping Use   Vaping status: Never Used  Substance and Sexual Activity   Alcohol use: Not Currently   Drug use: No   Sexual activity: Not on file  Other Topics Concern   Not on file  Social History Narrative   Works for [employer]    Married   m   Social Drivers of Health   Financial Resource Strain: Medium Risk (01/21/2024)   Overall Financial Resource Strain (CARDIA)    Difficulty of Paying Living Expenses: Somewhat hard  Food Insecurity: No Food Insecurity (01/21/2024)   Hunger Vital Sign    Worried About Running Out of Food in the Last Year: Never true    Ran Out of Food in the Last Year: Never true  Transportation Needs: No Transportation Needs (01/21/2024)   PRAPARE - Administrator, Civil Service (Medical): No    Lack of Transportation (Non-Medical): No  Physical Activity: Sufficiently Active (01/21/2024)   Exercise Vital Sign    Days of Exercise per Week: 3 days    Minutes of Exercise per Session: 60 min  Stress: No Stress Concern Present (01/21/2024)   [employer] of Occupational Health - Occupational Stress Questionnaire    Feeling of Stress: Not at all  Social Connections: Moderately Isolated (01/21/2024)   Social Connection and Isolation Panel    Frequency of Communication with Friends and Family: Twice a week    Frequency of Social Gatherings with Friends and Family: Once a week    Attends Religious Services: Never    Database administrator or Organizations: No    Attends Engineer, structural: Not on file    Marital Status: Married  Catering manager Violence: Not  At Risk (03/28/2022)   Humiliation, Afraid, Rape, and Kick questionnaire    Fear of Current or Ex-Partner: No    Emotionally Abused: No    Physically Abused: No    Sexually Abused: No    Past Surgical History:  Procedure Laterality Date   HERNIA REPAIR  2007   x2   PAROTIDECTOMY Left 10/07/2022   Procedure: LEFT PAROTIDECTOMY;  Surgeon: Carlie Clark, MD;  Location: [facility];  Service: ENT;  Laterality: Left;   SHOULDER SURGERY Right 2018   TONSILLECTOMY     age 51    Family History  Problem Relation Age of Onset   Hypertension Other    Crohn's disease Sister    Colon cancer Neg Hx    Rectal cancer Neg Hx    Stomach cancer  Neg Hx    Esophageal cancer Neg Hx     Allergies  Allergen Reactions   Actos  [Pioglitazone ] Diarrhea   Glipizide      Abdominal pain    Glucophage  [Metformin ] Diarrhea   Jardiance  [Empagliflozin ]     Abdominal pain     Current Outpatient Medications on File Prior to Visit  Medication Sig Dispense Refill   acetaminophen  (TYLENOL ) 500 MG tablet Take 1,000 mg by mouth every 6 (six) hours as needed for mild pain.     allopurinol  (ZYLOPRIM ) 300 MG tablet Take 1 tablet (300 mg total) by mouth daily. 90 tablet 3   ALPRAZolam  (XANAX ) 0.25 MG tablet TAKE 1 TABLET BY MOUTH 2 TIMES DAILY AS NEEDED FOR ANXIETY. 20 tablet 2   Continuous Glucose Sensor (FREESTYLE LIBRE 3 PLUS SENSOR) MISC Change sensor every 15 days. 6 each 3   cyclobenzaprine  (FLEXERIL ) 10 MG tablet Take 1 tablet (10 mg total) by mouth at bedtime. 30 tablet 0   dabigatran  (PRADAXA ) 150 MG CAPS capsule Take 1 capsule (150 mg total) by mouth 2 (two) times daily. 60 capsule 11   dicyclomine  (BENTYL ) 20 MG tablet Take 1 tablet (20 mg total) by mouth 4 (four) times daily -  before meals and at bedtime. 360 tablet 1   diltiazem  (CARDIZEM  SR) 120 MG 12 hr capsule Take 1 capsule (120 mg total) by mouth daily. 90 capsule 2   doxycycline  (VIBRAMYCIN ) 100 MG capsule Take 100 mg by mouth 2  (two) times daily.     finasteride  (PROSCAR ) 5 MG tablet TAKE 1 TABLET (5 MG TOTAL) BY MOUTH DAILY. 90 tablet 3   insulin  aspart (FIASP  FLEXTOUCH) 100 UNIT/ML FlexTouch Pen Inject 4-7 Units into the skin 3 (three) times daily before meals. 25 mL 3   insulin  glargine (LANTUS ) 100 UNIT/ML Solostar Pen Inject 20 Units into the skin at bedtime. (Patient taking differently: Inject 12 Units into the skin at bedtime.) 18 mL 3   Insulin  Pen Needle (PEN NEEDLES) 31G X 5 MM MISC Use to inject insulin  4 times daily 300 each 3   levothyroxine  (SYNTHROID ) 150 MCG tablet Take 1 tablet (150 mcg total) by mouth daily. 90 tablet 1   lisinopril  (ZESTRIL ) 5 MG tablet TAKE 1 TABLET (5 MG TOTAL) BY MOUTH DAILY. 90 tablet 0   metoprolol  succinate (TOPROL -XL) 50 MG 24 hr tablet Take 1 tablet (50 mg total) by mouth daily. 90 tablet 2   traMADol  (ULTRAM ) 50 MG tablet Take 1 tablet (50 mg total) by mouth every 8 (eight) hours as needed. 60 tablet 2   traZODone  (DESYREL ) 50 MG tablet TAKE 1 TABLET BY MOUTH EVERY DAY AT BEDTIME AS NEEDED FOR SLEEP 90 tablet 1   triamcinolone  lotion (KENALOG ) 0.1 % Apply 1 Application topically 2 (two) times daily as needed. (Patient taking differently: Apply 1 Application topically daily as needed.) 60 mL 2   triamterene -hydrochlorothiazide (MAXZIDE) 75-50 MG tablet TAKE 1 TABLET BY MOUTH EVERY DAY 90 tablet 1   No current facility-administered medications on file prior to visit.    BP 100/70   Pulse (!) 48   Temp 97.6 F (36.4 C) (Oral)   Ht 5' 7 (1.702 m)   Wt 148 lb (67.1 kg)   SpO2 97%   BMI 23.18 kg/m       Objective:   Physical Exam Vitals and nursing note reviewed.  Constitutional:      General: He is not in acute distress.    Appearance: Normal appearance. He  is underweight. He is ill-appearing (chronically).  HENT:     Head: Normocephalic and atraumatic.     Right Ear: Tympanic membrane, ear canal and external ear normal. There is no impacted cerumen.     Left  Ear: Tympanic membrane, ear canal and external ear normal. There is no impacted cerumen.     Nose: Nose normal. No congestion or rhinorrhea.     Mouth/Throat:     Mouth: Mucous membranes are moist.     Pharynx: Oropharynx is clear.  Eyes:     Extraocular Movements: Extraocular movements intact.     Conjunctiva/sclera: Conjunctivae normal.     Pupils: Pupils are equal, round, and reactive to light.  Neck:     Vascular: No carotid bruit.  Cardiovascular:     Rate and Rhythm: Normal rate and regular rhythm.     Pulses: Normal pulses.     Heart sounds: No murmur heard.    No friction rub. No gallop.  Pulmonary:     Effort: Pulmonary effort is normal.     Breath sounds: Normal breath sounds.  Abdominal:     General: Abdomen is flat. Bowel sounds are normal. There is no distension.     Palpations: Abdomen is soft. There is no mass.     Tenderness: There is no abdominal tenderness. There is no guarding or rebound.     Hernia: No hernia is present.  Musculoskeletal:        General: Normal range of motion.     Cervical back: Normal range of motion and neck supple.  Lymphadenopathy:     Cervical: No cervical adenopathy.  Skin:    General: Skin is warm and dry.     Capillary Refill: Capillary refill takes less than 2 seconds.  Neurological:     General: No focal deficit present.     Mental Status: He is alert and oriented to person, place, and time.  Psychiatric:        Mood and Affect: Mood normal.        Behavior: Behavior normal. Behavior is cooperative.        Thought Content: Thought content normal.        Judgment: Judgment normal.       Assessment & Plan:  1. Routine general medical examination at a health care facility (Primary) Today patient counseled on age appropriate routine health concerns for screening and prevention, each reviewed and up to date or declined. Immunizations reviewed and up to date or declined. Labs ordered and reviewed. Risk factors for depression  reviewed and negative. Hearing function and visual acuity are intact. ADLs screened and addressed as needed. Functional ability and level of safety reviewed and appropriate. Education, counseling and referrals performed based on assessed risks today. Patient provided with a copy of personalized plan for preventive services. - Stay active and eat healthy  - Recently had CBC, CMP, TSH and A1c checked so we will not do those today  - Encouraged Colonoscopy   2. Type 2 diabetes mellitus with other specified complication, with long-term current use of insulin  [facility]) - Per endocrinology  - Lipid panel; Future - Microalbumin/Creatinine Ratio, Urine; Future - Microalbumin/Creatinine Ratio, Urine - Lipid panel  3. Hypothyroidism, unspecified type - Per endocrinology  - Lipid panel; Future - Lipid panel  4. Chronic pancreatitis, unspecified pancreatitis type (HCC) - Follow up with GI  - Lipid panel; Future - Lipid panel  5. PAF (paroxysmal atrial fibrillation) (HCC) - SR today.  - Follow up  with Afib clinic as directed - Lipid panel; Future - Lipid panel  6. Essential hypertension - Hypotensive today but not symptomatic. Continue to monitor  - Lipid panel; Future - Lipid panel  7. BPH associated with nocturia - Continue Proscar   - PSA; Future - PSA  8. Idiopathic gout involving toe, unspecified chronicity, unspecified laterality - Controlled. Continue with Allopurinol   - Lipid panel; Future - Uric Acid; Future - Uric Acid - Lipid panel  9. Primary insomnia - Continue Trazadone PRN   10. Anxiety - Continue Xanax  as needed   Darleene Shape, NP

## 2024-01-22 NOTE — Patient Instructions (Addendum)
 It was great seeing you today   We will follow up with you regarding your lab work   Please let me know if you need anything

## 2024-01-26 ENCOUNTER — Ambulatory Visit: Payer: Self-pay | Admitting: Adult Health

## 2024-02-04 NOTE — Progress Notes (Signed)
 Jose Evans                                          MRN: [id number]   02/04/2024   The [facility] Quality Team Specialist reviewed this patient medical record for the purposes of chart review for care gap closure. The following were reviewed: abstraction for care gap closure-kidney health evaluation for diabetes:eGFR  and uACR.    [facility]I Quality Team

## 2024-02-12 ENCOUNTER — Other Ambulatory Visit (HOSPITAL_COMMUNITY): Payer: Self-pay

## 2024-02-12 MED ORDER — DILTIAZEM HCL ER 120 MG PO CP12: 120.0000 mg | ORAL_CAPSULE | Freq: Every day | ORAL | 2 refills | Status: AC

## 2024-02-24 ENCOUNTER — Other Ambulatory Visit: Payer: Self-pay | Admitting: Adult Health

## 2024-02-24 DIAGNOSIS — I10 Essential (primary) hypertension: Secondary | ICD-10-CM

## 2024-02-29 DIAGNOSIS — Z85828 Personal history of other malignant neoplasm of skin: Secondary | ICD-10-CM | POA: Diagnosis not present

## 2024-02-29 DIAGNOSIS — D225 Melanocytic nevi of trunk: Secondary | ICD-10-CM | POA: Diagnosis not present

## 2024-02-29 DIAGNOSIS — D2372 Other benign neoplasm of skin of left lower limb, including hip: Secondary | ICD-10-CM | POA: Diagnosis not present

## 2024-02-29 DIAGNOSIS — L309 Dermatitis, unspecified: Secondary | ICD-10-CM | POA: Diagnosis not present

## 2024-02-29 DIAGNOSIS — L821 Other seborrheic keratosis: Secondary | ICD-10-CM | POA: Diagnosis not present

## 2024-02-29 DIAGNOSIS — L820 Inflamed seborrheic keratosis: Secondary | ICD-10-CM | POA: Diagnosis not present

## 2024-02-29 DIAGNOSIS — L738 Other specified follicular disorders: Secondary | ICD-10-CM | POA: Diagnosis not present

## 2024-02-29 DIAGNOSIS — L905 Scar conditions and fibrosis of skin: Secondary | ICD-10-CM | POA: Diagnosis not present

## 2024-02-29 DIAGNOSIS — L570 Actinic keratosis: Secondary | ICD-10-CM | POA: Diagnosis not present

## 2024-02-29 DIAGNOSIS — D2272 Melanocytic nevi of left lower limb, including hip: Secondary | ICD-10-CM | POA: Diagnosis not present

## 2024-02-29 DIAGNOSIS — D485 Neoplasm of uncertain behavior of skin: Secondary | ICD-10-CM | POA: Diagnosis not present

## 2024-02-29 DIAGNOSIS — D692 Other nonthrombocytopenic purpura: Secondary | ICD-10-CM | POA: Diagnosis not present

## 2024-03-12 ENCOUNTER — Other Ambulatory Visit: Payer: Self-pay | Admitting: Adult Health

## 2024-03-12 DIAGNOSIS — G8929 Other chronic pain: Secondary | ICD-10-CM

## 2024-03-31 ENCOUNTER — Ambulatory Visit (INDEPENDENT_AMBULATORY_CARE_PROVIDER_SITE_OTHER): Admitting: Family Medicine

## 2024-03-31 DIAGNOSIS — Z0000 Encounter for general adult medical examination without abnormal findings: Secondary | ICD-10-CM

## 2024-03-31 DIAGNOSIS — D487 Neoplasm of uncertain behavior of other specified sites: Secondary | ICD-10-CM | POA: Diagnosis not present

## 2024-03-31 DIAGNOSIS — D485 Neoplasm of uncertain behavior of skin: Secondary | ICD-10-CM | POA: Diagnosis not present

## 2024-03-31 DIAGNOSIS — D225 Melanocytic nevi of trunk: Secondary | ICD-10-CM | POA: Diagnosis not present

## 2024-03-31 NOTE — Patient Instructions (Addendum)
 I really enjoyed getting to talk with you today! I am available on Tuesdays and Thursdays for virtual visits if you have any questions or concerns, or if I can be of any further assistance.   CHECKLIST FROM ANNUAL WELLNESS VISIT:  -Follow up (please call to schedule if not scheduled after visit):   -please schedule follow up with your Gastroenterologist    -yearly for annual wellness visit with primary care office  Here is a list of your preventive care/health maintenance measures and the plan for each if any are due:  PLAN For any measures below that may be due:    1. Please schedule inperson eye exam yearly   2. Can get vaccines at the pharmacy - please let us  know if you do so that we can update your record.   Health Maintenance  Topic Date Due   Zoster Vaccines- Shingrix (1 of 2) Never done   DTaP/Tdap/Td (2 - Td or Tdap) 06/15/2021   COVID-19 Vaccine (5 - 2025-26 season) 12/21/2023   OPHTHALMOLOGY EXAM  01/13/2024   Medicare Annual Wellness (AWV)  04/01/2024   HEMOGLOBIN A1C  06/27/2024   Diabetic kidney evaluation - eGFR measurement  12/22/2024   FOOT EXAM  12/28/2024   Diabetic kidney evaluation - Urine ACR  01/21/2025   Pneumococcal Vaccine: 50+ Years  Completed   Influenza Vaccine  Completed   Hepatitis C Screening  Completed   Meningococcal B Vaccine  Aged Out    -See a dentist at least yearly  -Get your eyes checked and then per your eye specialist's recommendations  -Other issues addressed today:   -I have included below further information regarding a healthy whole foods based diet, physical activity guidelines for adults, stress management and opportunities for social connections. I hope you find this information useful.    -----------------------------------------------------------------------------------------------------------------------------------------------------------------------------------------------------------------------------------------------------------    NUTRITION: -please continue to consult with your dietician  EXERCISE GUIDELINES FOR ADULTS: -if you wish to increase your physical activity, do so gradually and with the approval of your doctor -STOP and seek medical care immediately if you have any chest pain, chest discomfort or trouble breathing when starting or increasing exercise  -move and stretch your body, legs, feet and arms when sitting for long periods -Physical activity guidelines for optimal health in adults: -get at least 150 minutes per week of moderate exercise (can talk, but not sing); this is about 20-30 minutes of sustained activity 5-7 days per week or two 10-15 minute episodes of sustained activity 5-7 days per week -do some muscle building/resistance training/strength training at least 2 days per week  -balance exercises 3+ days per week:   Stand somewhere where you have something sturdy to hold onto if you lose balance    1) lift up on toes, then back down, start with 5x per day and work up to 20x   2) stand and lift one leg straight out to the side so that foot is a few inches of the floor, start with 5x each side and work up to 20x each side   3) stand on one foot, start with 5 seconds each side and work up to 20 seconds on each side  If you need ideas or help with getting more active:  -Silver sneakers https://tools.silversneakers.com  -Walk with a Doc: Http://www.duncan-williams.com/  -try to include resistance (weight lifting/strength building) and balance exercises twice per week: or the following link for ideas: http://castillo-powell.com/  buyducts.dk  STRESS  MANAGEMENT: -can try meditating, or just sitting quietly  with deep breathing while intentionally relaxing all parts of your body for 5 minutes daily -if you need further help with stress, anxiety or depression please follow up with your primary doctor or contact the wonderful folks at [facility]: [phone]  SOCIAL CONNECTIONS: -options in [place] if you wish to engage in more social and exercise related activities:  -Silver sneakers https://tools.silversneakers.com  -Walk with a Doc: Http://www.duncan-williams.com/  -Check out the [facility] Active Adults 50+ section on the [place] of [employer] companies (hiking clubs, book clubs, cards and games, chess, exercise classes, aquatic classes and much more) - see the website for details: https://www.[place].gov/departments/parks-recreation/active-adults50  -YouTube has lots of exercise videos for different ages and abilities as well  -[facility] (a variety of indoor and outdoor inperson activities for adults). 6510689401. [address]  -Virtual Online Classes (a variety of topics): see seniorplanet.org or call 7277804870  -consider volunteering at a school, hospice center, church, senior center or elsewhere

## 2024-03-31 NOTE — Progress Notes (Signed)
 ----------------------------------------------------------------------------------------------------------------------------------------------------------------------------------------------------------------------  Because this visit was a virtual/telehealth visit, some criteria may be missing or patient reported. Any vitals not documented were not able to be obtained and vitals that have been documented are patient reported.    MEDICARE ANNUAL PREVENTIVE CARE VISIT WITH PROVIDER (Welcome to Medicare, initial annual wellness or annual wellness exam)  Virtual Visit via Video Note  I connected with Jose Evans on 03/31/2024  by  a video enabled telemedicine application and verified that I am speaking with the correct person using two identifiers.  Location patient: home Location provider:work or home office Persons participating in the virtual visit: patient, provider  Concerns and/or follow up today: detailed intake and health/risks assessment completed on flow sheets and below- please see for details.  No new concerns. He has been dealing with bowel issues for sometime - unchanged. Reports has seen gastroenterologist. Frustrated with diet related to diabetes and chronic pancreatitis - seeing dietician for this. Sees endocrinologist as well. Report sugar have been good and within goal range most of the time.  How often do you have a drink containing alcohol?n How many drinks containing alcohol do you have on a typical day when you are drinking?na How often do you have six or more drinks on one occasion?na Have you ever smoked?y Quit date if applicable? 07  How many packs a day do/did you smoke? na Do you use smokeless tobacco?na Do you use an illicit drugs?na Do you feel safe at home? yes Last dentist visit?goes on a regular basis Last eye Exam and location? [facility]  - reports did virtual visit this year, agrees to schedule in person visit   See HM section in Epic for other  details of completed HM.    ROS: negative for report of fevers, unintentional weight loss - reports weight has been stable, vision changes, vision loss, hearing loss or change, chest pain, sob, hemoptysis, melena, hematochezia, hematuria,bleeding or bruising  Patient-completed extensive health risk assessment - reviewed and discussed with the patient: See Health Risk Assessment completed with patient prior to the visit either above or in recent phone note. This was reviewed in detailed with the patient today and appropriate recommendations, orders and referrals were placed as needed per Summary below and patient instructions.   Review of Medical History: -PMH, PSH, Family History and current specialty and care providers reviewed and updated and listed below   Patient Care Team: Merna Huxley, NP as PCP - General (Family Medicine) Inocencio Soyla Lunger, MD as PCP - Electrophysiology (Cardiology) Liane Sharyne MATSU, [facility] (Inactive) as Pharmacist (Pharmacist)   Past Medical History:  Diagnosis Date   Adult acne    Cancer (HCC) 07/2017   skin cancer   Chronic Pancreatitis 01/2023   Diabetes mellitus without complication (HCC)    Dyspnea    Gout    Hypertension    Hypothyroidism    Thyroid  disease     Past Surgical History:  Procedure Laterality Date   HERNIA REPAIR  2007   x2   PAROTIDECTOMY Left 10/07/2022   Procedure: LEFT PAROTIDECTOMY;  Surgeon: Carlie Clark, MD;  Location: [facility];  Service: ENT;  Laterality: Left;   SHOULDER SURGERY Right 2018   TONSILLECTOMY     age 8    Social History   Socioeconomic History   Marital status: Married    Spouse name: Not on file   Number of children: Not on file   Years of education: Not on file   Highest  education level: Some college, no degree  Occupational History   Not on file  Tobacco Use   Smoking status: Former    Current packs/day: 0.00    Average packs/day: 0.5 packs/day for 30.0 years (15.0 ttl  pk-yrs)    Types: Cigarettes    Start date: 09/08/1975    Quit date: 09/07/2005    Years since quitting: 18.5   Smokeless tobacco: Never   Tobacco comments:    Former smoker 11/13/23  Vaping Use   Vaping status: Never Used  Substance and Sexual Activity   Alcohol use: Not Currently   Drug use: No   Sexual activity: Not on file  Other Topics Concern   Not on file  Social History Narrative   Works for [employer]    Married   m   Social Drivers of Health   Tobacco Use: Medium Risk (01/22/2024)   Patient History    Smoking Tobacco Use: Former    Smokeless Tobacco Use: Never    Passive Exposure: Not on Actuary Strain: Low Risk (03/30/2024)   Overall Financial Resource Strain (CARDIA)    Difficulty of Paying Living Expenses: Not very hard  Recent Concern: Financial Resource Strain - Medium Risk (01/21/2024)   Overall Financial Resource Strain (CARDIA)    Difficulty of Paying Living Expenses: Somewhat hard  Food Insecurity: No Food Insecurity (03/30/2024)   Epic    Worried About Radiation Protection Practitioner of Food in the Last Year: Never true    Ran Out of Food in the Last Year: Never true  Transportation Needs: No Transportation Needs (03/30/2024)   Epic    Lack of Transportation (Medical): No    Lack of Transportation (Non-Medical): No  Physical Activity: Insufficiently Active (03/31/2024)   Exercise Vital Sign    Days of Exercise per Week: 1 day    Minutes of Exercise per Session: 60 min  Stress: No Stress Concern Present (03/31/2024)   [employer] of Occupational Health - Occupational Stress Questionnaire    Feeling of Stress: Not at all  Social Connections: Moderately Isolated (03/30/2024)   Social Connection and Isolation Panel    Frequency of Communication with Friends and Family: Three times a week    Frequency of Social Gatherings with Friends and Family: Once a week    Attends Religious Services: Never    Database Administrator or Organizations:  No    Attends Engineer, Structural: Not on file    Marital Status: Married  Catering Manager Violence: Not At Risk (03/28/2022)   Humiliation, Afraid, Rape, and Kick questionnaire    Fear of Current or Ex-Partner: No    Emotionally Abused: No    Physically Abused: No    Sexually Abused: No  Depression (PHQ2-9): Low Risk (03/31/2024)   Depression (PHQ2-9)    PHQ-2 Score: 0  Alcohol Screen: Low Risk (02/02/2023)   Alcohol Screen    Last Alcohol Screening Score (AUDIT): 1  Housing: Unknown (03/30/2024)   Epic    Unable to Pay for Housing in the Last Year: No    Number of Times Moved in the Last Year: Not on file    Homeless in the Last Year: No  Utilities: Not At Risk (03/29/2023)   AHC Utilities    Threatened with loss of utilities: No  Health Literacy: Not on file    Family History  Problem Relation Age of Onset   Hypertension Other    Crohn's disease Sister    Colon  cancer Neg Hx    Rectal cancer Neg Hx    Stomach cancer Neg Hx    Esophageal cancer Neg Hx     Medications Ordered Prior to Encounter[1]  Allergies[2]     Physical Exam Vitals requested from patient and listed below if patient had equipment and was able to obtain at home for this virtual visit: There were no vitals filed for this visit. Estimated body mass index is 23.18 kg/m as calculated from the following:   Height as of 01/22/24: 5' 7 (1.702 m).   Weight as of 01/22/24: 148 lb (67.1 kg).  EKG (optional): deferred due to virtual visit  GENERAL: alert, oriented, no acute distress detected; full vision exam deferred due to pandemic and/or virtual encounter  HEENT: atraumatic, conjunttiva clear, no obvious abnormalities on inspection of external nose and ears  NECK: normal movements of the head and neck  LUNGS: on inspection no signs of respiratory distress, breathing rate appears normal, no obvious gross SOB, gasping or wheezing  CV: no obvious cyanosis  MS: moves all visible  extremities without noticeable abnormality  PSYCH/NEURO: pleasant and cooperative, no obvious depression or anxiety, speech and thought processing grossly intact, Cognitive function grossly intact  Flowsheet Row Office Visit from 08/19/2023 in [facility] [place]  PHQ-9 Total Score 0        03/31/2024    6:00 PM 08/19/2023    8:53 AM 04/02/2023    4:59 PM 03/17/2023    4:00 PM 01/22/2023    8:10 AM  Depression screen PHQ 2/9  Decreased Interest 0 0 1 0 0  Down, Depressed, Hopeless 0 0 1 0 0  PHQ - 2 Score 0 0 2 0 0  Altered sleeping  0  0   Tired, decreased energy  0  0   Change in appetite  0  0   Feeling bad or failure about yourself   0  0   Trouble concentrating  0  0   Moving slowly or fidgety/restless  0  0   Suicidal thoughts  0  0   PHQ-9 Score  0   0    Difficult doing work/chores  Not difficult at all  Not difficult at all      Data saved with a previous flowsheet row definition       01/22/2023    8:10 AM 03/29/2023    8:58 AM 04/02/2023    4:59 PM 03/30/2024   10:50 PM 03/31/2024    6:08 PM  Fall Risk  Falls in the past year? 0 0  1 0    Was there an injury with Fall? 0  0   0     Fall Risk Category Calculator 0 0  1    Patient at Risk for Falls Due to     No Fall Risks  Fall risk Follow up     Falls evaluation completed     Manually entered by patient   Data saved with a previous flowsheet row definition     SUMMARY AND PLAN:  Encounter for Medicare annual wellness exam    Discussed applicable health maintenance/preventive health measures and advised and referred or ordered per patient preferences: -discussed vaccines due, he knows can get at the pharmacy -advised eye exam Health Maintenance  Topic Date Due   Zoster Vaccines- Shingrix (1 of 2) Never done   DTaP/Tdap/Td (2 - Td or Tdap) 06/15/2021   COVID-19 Vaccine (5 - 2025-26 season) 12/21/2023  OPHTHALMOLOGY EXAM  01/13/2024   HEMOGLOBIN A1C  06/27/2024   Diabetic  kidney evaluation - eGFR measurement  12/22/2024   FOOT EXAM  12/28/2024   Diabetic kidney evaluation - Urine ACR  01/21/2025   Medicare Annual Wellness (AWV)  03/31/2025   Pneumococcal Vaccine: 50+ Years  Completed   Influenza Vaccine  Completed   Hepatitis C Screening  Completed   Meningococcal B Vaccine  Aged Out     Education and counseling on the following was provided based on the above review of health and a plan/checklist for the patient, along with additional information discussed, was provided for the patient in the patient instructions :  -advised he follow up with his gastroenterologist about his chronic bowel issue -Advised on importance of completing advanced directives, and to bring copy to [facility] and counseled on a healthy lifestyle - including the importance of a healthy diet, regular physical activity, social connections and stress management. -Reviewed patient's current diet. He is seeing a dietician.  -reviewed patient's current physical activity level and discussed exercise guidelines for adults. Discussed community resources and ideas for safe exercise at home to assist in meeting exercise guideline recommendations in a safe and healthy way.  -Advise yearly dental visits at minimum and regular eye exams   Follow up: see patient instructions   Patient Instructions  I really enjoyed getting to talk with you today! I am available on Tuesdays and Thursdays for virtual visits if you have any questions or concerns, or if I can be of any further assistance.   CHECKLIST FROM ANNUAL WELLNESS VISIT:  -Follow up (please call to schedule if not scheduled after visit):   -please schedule follow up with your Gastroenterologist    -yearly for annual wellness visit with primary care office  Here is a list of your preventive care/health maintenance measures and the plan for each if any are due:  PLAN For any measures below that may be due:    1. Please schedule  inperson eye exam yearly   2. Can get vaccines at the pharmacy - please let us  know if you do so that we can update your record.   Health Maintenance  Topic Date Due   Zoster Vaccines- Shingrix (1 of 2) Never done   DTaP/Tdap/Td (2 - Td or Tdap) 06/15/2021   COVID-19 Vaccine (5 - 2025-26 season) 12/21/2023   OPHTHALMOLOGY EXAM  01/13/2024   Medicare Annual Wellness (AWV)  04/01/2024   HEMOGLOBIN A1C  06/27/2024   Diabetic kidney evaluation - eGFR measurement  12/22/2024   FOOT EXAM  12/28/2024   Diabetic kidney evaluation - Urine ACR  01/21/2025   Pneumococcal Vaccine: 50+ Years  Completed   Influenza Vaccine  Completed   Hepatitis C Screening  Completed   Meningococcal B Vaccine  Aged Out    -See a dentist at least yearly  -Get your eyes checked and then per your eye specialist's recommendations  -Other issues addressed today:   -I have included below further information regarding a healthy whole foods based diet, physical activity guidelines for adults, stress management and opportunities for social connections. I hope you find this information useful.   -----------------------------------------------------------------------------------------------------------------------------------------------------------------------------------------------------------------------------------------------------------    NUTRITION: -please continue to consult with your dietician  EXERCISE GUIDELINES FOR ADULTS: -if you wish to increase your physical activity, do so gradually and with the approval of your doctor -STOP and seek medical care immediately if you have any chest pain, chest discomfort or trouble breathing when starting or  increasing exercise  -move and stretch your body, legs, feet and arms when sitting for long periods -Physical activity guidelines for optimal health in adults: -get at least 150 minutes per week of moderate exercise (can talk, but not sing); this is about 20-30  minutes of sustained activity 5-7 days per week or two 10-15 minute episodes of sustained activity 5-7 days per week -do some muscle building/resistance training/strength training at least 2 days per week  -balance exercises 3+ days per week:   Stand somewhere where you have something sturdy to hold onto if you lose balance    1) lift up on toes, then back down, start with 5x per day and work up to 20x   2) stand and lift one leg straight out to the side so that foot is a few inches of the floor, start with 5x each side and work up to 20x each side   3) stand on one foot, start with 5 seconds each side and work up to 20 seconds on each side  If you need ideas or help with getting more active:  -Silver sneakers https://tools.silversneakers.com  -Walk with a Doc: Http://www.duncan-williams.com/  -try to include resistance (weight lifting/strength building) and balance exercises twice per week: or the following link for ideas: http://castillo-powell.com/  buyducts.dk  STRESS MANAGEMENT: -can try meditating, or just sitting quietly with deep breathing while intentionally relaxing all parts of your body for 5 minutes daily -if you need further help with stress, anxiety or depression please follow up with your primary doctor or contact the wonderful folks at [facility]: [phone]  SOCIAL CONNECTIONS: -options in [place] if you wish to engage in more social and exercise related activities:  -Silver sneakers https://tools.silversneakers.com  -Walk with a Doc: Http://www.duncan-williams.com/  -Check out the [facility] Active Adults 50+ section on the [place] of [employer] companies (hiking clubs, book clubs, cards and games, chess, exercise classes, aquatic classes and much more) - see the website for details: https://www.[place].gov/departments/parks-recreation/active-adults50  -YouTube has  lots of exercise videos for different ages and abilities as well  -[facility] (a variety of indoor and outdoor inperson activities for adults). [phone]. [address].  -Virtual Online Classes (a variety of topics): see seniorplanet.org or call [phone]  -consider volunteering at a school, hospice center, church, senior center or elsewhere            Chiquita JONELLE Cramp, DO     [1]  Current Outpatient Medications on File Prior to Visit  Medication Sig Dispense Refill   traMADol  (ULTRAM ) 50 MG tablet Take 1 tablet (50 mg total) by mouth every 8 (eight) hours as needed. 60 tablet 2   acetaminophen  (TYLENOL ) 500 MG tablet Take 1,000 mg by mouth every 6 (six) hours as needed for mild pain.     allopurinol  (ZYLOPRIM ) 300 MG tablet Take 1 tablet (300 mg total) by mouth daily. 90 tablet 3   ALPRAZolam  (XANAX ) 0.25 MG tablet TAKE 1 TABLET BY MOUTH 2 TIMES DAILY AS NEEDED FOR ANXIETY. 20 tablet 2   Continuous Glucose Sensor (FREESTYLE LIBRE 3 PLUS SENSOR) MISC Change sensor every 15 days. 6 each 3   cyclobenzaprine  (FLEXERIL ) 10 MG tablet TAKE 1 TABLET BY MOUTH EVERYDAY AT BEDTIME 30 tablet 0   dabigatran  (PRADAXA ) 150 MG CAPS capsule Take 1 capsule (150 mg total) by mouth 2 (two) times daily. 60 capsule 11   dicyclomine  (BENTYL ) 20 MG tablet Take 1 tablet (20 mg total) by mouth 4 (four) times daily -  before meals and at bedtime. 360 tablet 1   diltiazem  (CARDIZEM  SR) 120 MG 12 hr capsule Take 1 capsule (120 mg total) by mouth daily. 90 capsule 2   doxycycline  (VIBRAMYCIN ) 100 MG capsule Take 100 mg by mouth 2 (two) times daily.     finasteride  (PROSCAR ) 5 MG tablet TAKE 1 TABLET (5 MG TOTAL) BY MOUTH DAILY. 90 tablet 3   insulin  aspart (FIASP  FLEXTOUCH) 100 UNIT/ML FlexTouch Pen Inject 4-7 Units into the skin 3 (three) times daily before meals. 25 mL 3   insulin  glargine (LANTUS ) 100 UNIT/ML Solostar Pen Inject 20 Units into the skin at bedtime. (Patient taking  differently: Inject 12 Units into the skin at bedtime.) 18 mL 3   Insulin  Pen Needle (PEN NEEDLES) 31G X 5 MM MISC Use to inject insulin  4 times daily 300 each 3   levothyroxine  (SYNTHROID ) 150 MCG tablet Take 1 tablet (150 mcg total) by mouth daily. 90 tablet 1   lisinopril  (ZESTRIL ) 5 MG tablet TAKE 1 TABLET (5 MG TOTAL) BY MOUTH DAILY. 90 tablet 0   metoprolol  succinate (TOPROL -XL) 50 MG 24 hr tablet Take 1 tablet (50 mg total) by mouth daily. 90 tablet 2   traZODone  (DESYREL ) 50 MG tablet TAKE 1 TABLET BY MOUTH EVERY DAY AT BEDTIME AS NEEDED FOR SLEEP 90 tablet 1   triamcinolone  lotion (KENALOG ) 0.1 % Apply 1 Application topically 2 (two) times daily as needed. (Patient taking differently: Apply 1 Application topically daily as needed.) 60 mL 2   triamterene -hydrochlorothiazide (MAXZIDE) 75-50 MG tablet TAKE 1 TABLET BY MOUTH EVERY DAY 90 tablet 1   No current facility-administered medications on file prior to visit.  [2]  Allergies Allergen Reactions   Actos  [Pioglitazone ] Diarrhea   Glipizide      Abdominal pain    Glucophage  [Metformin ] Diarrhea   Jardiance  [Empagliflozin ]     Abdominal pain

## 2024-04-22 ENCOUNTER — Encounter: Payer: Self-pay | Admitting: Adult Health

## 2024-04-22 NOTE — Telephone Encounter (Signed)
"  Ok to fill?  "

## 2024-04-26 ENCOUNTER — Other Ambulatory Visit: Payer: Self-pay | Admitting: Adult Health

## 2024-04-26 DIAGNOSIS — R197 Diarrhea, unspecified: Secondary | ICD-10-CM

## 2024-04-26 DIAGNOSIS — R109 Unspecified abdominal pain: Secondary | ICD-10-CM

## 2024-04-26 MED ORDER — DICYCLOMINE HCL 20 MG PO TABS: 20.0000 mg | ORAL_TABLET | Freq: Three times a day (TID) | ORAL | 1 refills | Status: AC

## 2024-04-29 ENCOUNTER — Ambulatory Visit: Admitting: Nurse Practitioner

## 2024-04-29 ENCOUNTER — Encounter: Payer: Self-pay | Admitting: Nurse Practitioner

## 2024-04-29 ENCOUNTER — Other Ambulatory Visit (HOSPITAL_COMMUNITY): Payer: Self-pay

## 2024-04-29 VITALS — BP 108/70 | HR 60 | Ht 69.0 in | Wt 149.0 lb

## 2024-04-29 DIAGNOSIS — E119 Type 2 diabetes mellitus without complications: Secondary | ICD-10-CM

## 2024-04-29 DIAGNOSIS — E139 Other specified diabetes mellitus without complications: Secondary | ICD-10-CM

## 2024-04-29 DIAGNOSIS — E890 Postprocedural hypothyroidism: Secondary | ICD-10-CM

## 2024-04-29 DIAGNOSIS — E559 Vitamin D deficiency, unspecified: Secondary | ICD-10-CM

## 2024-04-29 DIAGNOSIS — E1065 Type 1 diabetes mellitus with hyperglycemia: Secondary | ICD-10-CM | POA: Diagnosis not present

## 2024-04-29 DIAGNOSIS — Z794 Long term (current) use of insulin: Secondary | ICD-10-CM

## 2024-04-29 DIAGNOSIS — E109 Type 1 diabetes mellitus without complications: Secondary | ICD-10-CM

## 2024-04-29 LAB — POCT GLYCOSYLATED HEMOGLOBIN (HGB A1C): Hemoglobin A1C: 6.9 % — AB (ref 4.0–5.6)

## 2024-04-29 MED ORDER — INSULIN GLARGINE 100 UNIT/ML SOLOSTAR PEN: 20.0000 [IU] | PEN_INJECTOR | Freq: Every day | SUBCUTANEOUS | 3 refills | Status: AC

## 2024-04-29 MED ORDER — FREESTYLE LIBRE 3 PLUS SENSOR MISC: 3 refills | Status: AC

## 2024-04-29 MED ORDER — PEN NEEDLES 31G X 5 MM MISC: 3 refills | Status: AC

## 2024-04-29 MED ORDER — FIASP FLEXTOUCH 100 UNIT/ML ~~LOC~~ SOPN: 4.0000 [IU] | PEN_INJECTOR | Freq: Three times a day (TID) | SUBCUTANEOUS | 3 refills | Status: AC

## 2024-04-29 NOTE — Progress Notes (Signed)
 "                                                                        Endocrinology Follow Up Note       04/29/2024, 9:43 AM   Subjective:    Patient ID: Jose Evans, male    DOB: 1945/08/02.  Jose Evans is being seen in follow up after being seen in consultation for management of currently uncontrolled symptomatic diabetes requested by  Merna Huxley, NP.   Past Medical History:  Diagnosis Date   Adult acne    Cancer (HCC) 07/2017   skin cancer   Chronic Pancreatitis 01/2023   Diabetes mellitus without complication (HCC)    Dyspnea    Gout    Hypertension    Hypothyroidism    Thyroid  disease     Past Surgical History:  Procedure Laterality Date   HERNIA REPAIR  2007   x2   PAROTIDECTOMY Left 10/07/2022   Procedure: LEFT PAROTIDECTOMY;  Surgeon: Carlie Clark, MD;  Location: [facility];  Service: ENT;  Laterality: Left;   SHOULDER SURGERY Right 2018   TONSILLECTOMY     age 79    Social History   Socioeconomic History   Marital status: Married    Spouse name: Not on file   Number of children: Not on file   Years of education: Not on file   Highest education level: Some college, no degree  Occupational History   Not on file  Tobacco Use   Smoking status: Former    Current packs/day: 0.00    Average packs/day: 0.5 packs/day for 30.0 years (15.0 ttl pk-yrs)    Types: Cigarettes    Start date: 09/08/1975    Quit date: 09/07/2005    Years since quitting: 18.6   Smokeless tobacco: Never   Tobacco comments:    Former smoker 11/13/23  Vaping Use   Vaping status: Never Used  Substance and Sexual Activity   Alcohol use: Not Currently   Drug use: No   Sexual activity: Not on file  Other Topics Concern   Not on file  Social History Narrative   Works for [employer]    Married   m   Social Drivers of Health   Tobacco Use: Medium Risk (04/29/2024)   Patient History    Smoking Tobacco Use: Former    Smokeless Tobacco Use: Never     Passive Exposure: Not on Actuary Strain: Low Risk (03/30/2024)   Overall Financial Resource Strain (CARDIA)    Difficulty of Paying Living Expenses: Not very hard  Recent Concern: Financial Resource Strain - Medium Risk (01/21/2024)   Overall Financial Resource Strain (CARDIA)    Difficulty of Paying Living Expenses: Somewhat hard  Food Insecurity: No Food Insecurity (03/30/2024)   Epic    Worried About Radiation Protection Practitioner of Food in the Last Year: Never true    Ran Out of Food in the Last Year: Never true  Transportation Needs: No Transportation Needs (03/30/2024)   Epic    Lack of Transportation (Medical): No    Lack of Transportation (Non-Medical): No  Physical Activity: Insufficiently Active (03/31/2024)   Exercise Vital Sign    Days of Exercise per Week:  1 day    Minutes of Exercise per Session: 60 min  Stress: No Stress Concern Present (03/31/2024)   [employer] of Occupational Health - Occupational Stress Questionnaire    Feeling of Stress: Not at all  Social Connections: Moderately Isolated (03/30/2024)   Social Connection and Isolation Panel    Frequency of Communication with Friends and Family: Three times a week    Frequency of Social Gatherings with Friends and Family: Once a week    Attends Religious Services: Never    Database Administrator or Organizations: No    Attends Engineer, Structural: Not on file    Marital Status: Married  Depression (PHQ2-9): Low Risk (03/31/2024)   Depression (PHQ2-9)    PHQ-2 Score: 0  Alcohol Screen: Low Risk (02/02/2023)   Alcohol Screen    Last Alcohol Screening Score (AUDIT): 1  Housing: Unknown (03/30/2024)   Epic    Unable to Pay for Housing in the Last Year: No    Number of Times Moved in the Last Year: Not on file    Homeless in the Last Year: No  Utilities: Not At Risk (03/29/2023)   AHC Utilities    Threatened with loss of utilities: No  Health Literacy: Not on file    Family History  Problem  Relation Age of Onset   Hypertension Other    Crohn's disease Sister    Colon cancer Neg Hx    Rectal cancer Neg Hx    Stomach cancer Neg Hx    Esophageal cancer Neg Hx     Outpatient Encounter Medications as of 04/29/2024  Medication Sig   acetaminophen  (TYLENOL ) 500 MG tablet Take 1,000 mg by mouth every 6 (six) hours as needed for mild pain.   allopurinol  (ZYLOPRIM ) 300 MG tablet Take 1 tablet (300 mg total) by mouth daily.   ALPRAZolam  (XANAX ) 0.25 MG tablet TAKE 1 TABLET BY MOUTH 2 TIMES DAILY AS NEEDED FOR ANXIETY.   cyclobenzaprine  (FLEXERIL ) 10 MG tablet TAKE 1 TABLET BY MOUTH EVERYDAY AT BEDTIME   dabigatran  (PRADAXA ) 150 MG CAPS capsule Take 1 capsule (150 mg total) by mouth 2 (two) times daily.   dicyclomine  (BENTYL ) 20 MG tablet Take 1 tablet (20 mg total) by mouth 4 (four) times daily -  before meals and at bedtime.   diltiazem  (CARDIZEM  SR) 120 MG 12 hr capsule Take 1 capsule (120 mg total) by mouth daily.   doxycycline  (VIBRAMYCIN ) 100 MG capsule Take 100 mg by mouth 2 (two) times daily. (Patient taking differently: Take 100 mg by mouth 2 (two) times daily. As Needed)   levothyroxine  (SYNTHROID ) 150 MCG tablet Take 1 tablet (150 mcg total) by mouth daily.   lisinopril  (ZESTRIL ) 5 MG tablet TAKE 1 TABLET (5 MG TOTAL) BY MOUTH DAILY.   metoprolol  succinate (TOPROL -XL) 50 MG 24 hr tablet Take 1 tablet (50 mg total) by mouth daily.   traMADol  (ULTRAM ) 50 MG tablet Take 1 tablet (50 mg total) by mouth every 8 (eight) hours as needed.   traZODone  (DESYREL ) 50 MG tablet TAKE 1 TABLET BY MOUTH EVERY DAY AT BEDTIME AS NEEDED FOR SLEEP   triamcinolone  lotion (KENALOG ) 0.1 % Apply 1 Application topically 2 (two) times daily as needed.   triamterene -hydrochlorothiazide (MAXZIDE) 75-50 MG tablet TAKE 1 TABLET BY MOUTH EVERY DAY   [DISCONTINUED] Continuous Glucose Sensor (FREESTYLE LIBRE 3 PLUS SENSOR) MISC Change sensor every 15 days.   [DISCONTINUED] Insulin  Pen Needle (PEN NEEDLES) 31G X  5 MM MISC  Use to inject insulin  4 times daily   Continuous Glucose Sensor (FREESTYLE LIBRE 3 PLUS SENSOR) MISC Change sensor every 15 days.   finasteride  (PROSCAR ) 5 MG tablet TAKE 1 TABLET (5 MG TOTAL) BY MOUTH DAILY.   insulin  aspart (FIASP  FLEXTOUCH) 100 UNIT/ML FlexTouch Pen Inject 4-7 Units into the skin 3 (three) times daily before meals.   insulin  glargine (LANTUS ) 100 UNIT/ML Solostar Pen Inject 20 Units into the skin at bedtime.   Insulin  Pen Needle (PEN NEEDLES) 31G X 5 MM MISC Use to inject insulin  4 times daily   [DISCONTINUED] insulin  aspart (FIASP  FLEXTOUCH) 100 UNIT/ML FlexTouch Pen Inject 4-7 Units into the skin 3 (three) times daily before meals.   [DISCONTINUED] insulin  glargine (LANTUS ) 100 UNIT/ML Solostar Pen Inject 20 Units into the skin at bedtime. (Patient taking differently: Inject 12 Units into the skin at bedtime.)   No facility-administered encounter medications on file as of 04/29/2024.    ALLERGIES: Allergies  Allergen Reactions   Actos  [Pioglitazone ] Diarrhea   Glipizide      Abdominal pain    Glucophage  [Metformin ] Diarrhea   Jardiance  [Empagliflozin ]     Abdominal pain     VACCINATION STATUS: Immunization History  Administered Date(s) Administered   Fluad Quad(high Dose 65+) 12/09/2018, 01/26/2020   Fluad Trivalent(High Dose 65+) 01/16/2023   INFLUENZA, HIGH DOSE SEASONAL PF 03/13/2014, 05/28/2015, 12/30/2017, 02/22/2021, 01/18/2024   Influenza Whole 03/05/2010   Influenza,inj,Quad PF,6+ Mos 02/09/2013   Influenza-Unspecified 02/22/2021, 01/18/2024   PFIZER Comirnaty(Gray Top)Covid-19 Tri-Sucrose Vaccine 09/06/2020   PFIZER(Purple Top)SARS-COV-2 Vaccination 06/17/2019, 07/12/2019, 01/20/2020   Pneumococcal Conjugate-13 05/28/2015   Pneumococcal Polysaccharide-23 06/25/2016   Tdap 06/16/2011    Diabetes He presents for his follow-up diabetic visit. He has type 2 diabetes mellitus. Onset time: diagnosed at approx age of 89. His disease course has  been stable. Hypoglycemia symptoms include nervousness/anxiousness, sweats and tremors. Pertinent negatives for diabetes include no fatigue, no polydipsia, no polyuria and no weight loss. There are no hypoglycemic complications. Symptoms are improving. (Chronic pancreatitis) Risk factors for coronary artery disease include diabetes mellitus, male sex, tobacco exposure and family history. Current diabetic treatment includes diet and intensive insulin  program. He is compliant with treatment most of the time. His weight is fluctuating minimally. He is following a generally healthy diet. Meal planning includes avoidance of concentrated sweets. He has had a previous visit with a dietitian (sees Valdez, RDE). He participates in exercise intermittently. His home blood glucose trend is fluctuating minimally. (He presents today with his CGM showing at goal glycemic profile overall.  His POCT A1c today is 6.9%, increasing from last visit of 6.2%.   Analysis of his CGM shows TIR 67%, TAR 33%, TBR 0% with a GMI of 7.2%.  He admits his eating patterns have been thrown off due to the holidays which has affected his glucose readings at times.  He also had questions today about storage of insulin  and how to know if insulin  goes bad.) An ACE inhibitor/angiotensin II receptor blocker is being taken. He does not see a podiatrist.Eye exam is current.   He was first diagnosed with thyroid  problems over 20 years ago.  He had the Flu and lost about 20 lbs within a few weeks time.  He started developing symptoms with high resting heart rate, diaphoresis, tremors and he was taken to his PCP then transferred to the hospital.  He was about to go in for cardiac surgery but insisted thyroid  labs be checked, which showed significant hyperthyroidism.  He  did have RAI ablation for this and has since been put on Levothyroxine .  He notes he has bounced around between 150-175 mcg until just recently where his dose was decreased to 150 mcg po daily.   He notes about 3 weeks ago, he read the package insert on instructions for proper administration and has since started taking it properly (empty stomach, waiting at least 30 minutes prior to eating or taking other meds, etc).   He denies any family history of thyroid  disease, has never had any dedicated imaging of his thyroid  since the initial diagnosis.  Review of systems  Constitutional: + stable body weight, current Body mass index is 22 kg/m., no fatigue, no subjective hyperthermia, no subjective hypothermia Eyes: no blurry vision, no xerophthalmia ENT: no sore throat, no nodules palpated in throat, no dysphagia/odynophagia, no hoarseness Cardiovascular: no chest pain, no shortness of breath, no palpitations (hx of afib- last episode over a year ago), no leg swelling Respiratory: no cough, no shortness of breath Gastrointestinal: no nausea/vomiting, + chronic diarrhea- stable Musculoskeletal: chronic left shoulder pain following fall 2 years ago where he tore rotator cuff Skin: no rashes, no hyperemia Neurological: + intermittent tremors, no numbness, no tingling, no dizziness Psychiatric: no depression, no anxiety,   Objective:     BP 108/70 (BP Location: Right Arm, Patient Position: Sitting, Cuff Size: Large)   Pulse 60   Ht 5' 9 (1.753 m)   Wt 149 lb (67.6 kg)   BMI 22.00 kg/m   Wt Readings from Last 3 Encounters:  04/29/24 149 lb (67.6 kg)  01/22/24 148 lb (67.1 kg)  12/29/23 149 lb (67.6 kg)     BP Readings from Last 3 Encounters:  04/29/24 108/70  01/22/24 100/70  12/29/23 102/60      Physical Exam- Limited  Constitutional:  Body mass index is 22 kg/m. , not in acute distress, normal state of mind Eyes:  EOMI, no exophthalmos Musculoskeletal: no gross deformities, strength intact in all four extremities, no gross restriction of joint movements Skin:  no rashes, no hyperemia Neurological: no tremor with outstretched hands   Diabetic Foot Exam - Simple   No  data filed      CMP ( most recent) CMP     Component Value Date/Time   NA 139 12/23/2023 1047   K 4.2 12/23/2023 1047   CL 102 12/23/2023 1047   CO2 21 12/23/2023 1047   GLUCOSE 133 (H) 12/23/2023 1047   GLUCOSE 244 (H) 04/20/2023 1335   BUN 26 12/23/2023 1047   CREATININE 0.88 12/23/2023 1047   CREATININE 1.14 01/26/2020 1338   CALCIUM 9.4 12/23/2023 1047   PROT 6.3 12/23/2023 1047   ALBUMIN 4.5 12/23/2023 1047   AST 43 (H) 12/23/2023 1047   ALT 108 (H) 12/23/2023 1047   ALKPHOS 89 12/23/2023 1047   BILITOT 0.3 12/23/2023 1047   GFR 77.32 04/20/2023 1335   EGFR 89 12/23/2023 1047   GFRNONAA >60 10/01/2022 1304   GFRNONAA 64 01/26/2020 1338     Diabetic Labs (most recent): Lab Results  Component Value Date   HGBA1C 6.9 (A) 04/29/2024   HGBA1C 6.2 (A) 12/29/2023   HGBA1C 6.2 (A) 09/08/2023   MICROALBUR 1.5 01/22/2024   MICROALBUR 30 mg/L 12/29/2023     Lipid Panel ( most recent) Lipid Panel     Component Value Date/Time   CHOL 106 01/22/2024 1013   TRIG 97.0 01/22/2024 1013   HDL 43.10 01/22/2024 1013   CHOLHDL 2 01/22/2024 1013  VLDL 19.4 01/22/2024 1013   LDLCALC 43 01/22/2024 1013   LDLCALC 122 (H) 01/26/2020 1338      Lab Results  Component Value Date   TSH 0.21 (L) 01/22/2024   TSH 0.229 (L) 12/23/2023   TSH 0.202 (L) 09/04/2023   TSH 0.13 (L) 04/20/2023   TSH 10.06 (H) 03/17/2023   TSH 0.93 10/10/2022   TSH 1.09 05/30/2022   TSH 0.08 (L) 04/23/2022   TSH 0.098 (L) 10/15/2021   TSH 0.43 02/28/2021   FREET4 1.64 12/23/2023   FREET4 1.63 09/04/2023   FREET4 1.9 (H) 03/01/2020   FREET4 1.07 06/23/2019   FREET4 1.27 05/20/2010           Assessment & Plan:   1) Type 2 diabetes mellitus with hyperglycemia, with long-term current use of insulin  (HCC) (Primary)  He presents today with his CGM showing at goal glycemic profile overall.  His POCT A1c today is 6.9%, increasing from last visit of 6.2%.   Analysis of his CGM shows TIR 67%, TAR  33%, TBR 0% with a GMI of 7.2%.  He admits his eating patterns have been thrown off due to the holidays which has affected his glucose readings at times.  He also had questions today about storage of insulin  and how to know if insulin  goes bad.   - Jose Evans has currently controlled symptomatic type 2 (Managed as like type 1 due to chronic pancreatitis) DM since 79 years of age.   He does have history of chronic pancreatitis and EPI on Creon .  His diabetes may be insulin  dependent based on this.  His GAD and insulin  antibodies were negative ruling out LADA but his insulin  and cpeptide levels were low indicating his pancreas is not producing the amount of insulin  his body needs, thus he will be treated as type 1.  -Recent labs reviewed.    - I had a long discussion with him about the progressive nature of diabetes and the pathology behind its complications. -his diabetes is complicated by chronic pancreatitis and he remains at a high risk for more acute and chronic complications which include CAD, CVA, CKD, retinopathy, and neuropathy. These are all discussed in detail with him.  The following Lifestyle Medicine recommendations according to American College of Lifestyle Medicine [facility]) were discussed and offered to patient and he agrees to start the journey:  A. Whole Foods, Plant-based plate comprising of fruits and vegetables, plant-based proteins, whole-grain carbohydrates was discussed in detail with the patient.   A list for source of those nutrients were also provided to the patient.  Patient will use only water or unsweetened tea for hydration. B.  The need to stay away from risky substances including alcohol, smoking; obtaining 7 to 9 hours of restorative sleep, at least 150 minutes of moderate intensity exercise weekly, the importance of healthy social connections,  and stress reduction techniques were discussed. C.  A full color page of  Calorie density of various food groups per pound  showing examples of each food groups was provided to the patient.  - Nutritional counseling repeated/built upon at each appointment.  - The patient admits there is a room for improvement in their diet and drink choices. -  Suggestion is made for the patient to avoid simple carbohydrates from their diet including Cakes, Sweet Desserts / Pastries, Ice Cream, Soda (diet and regular), Sweet Tea, Candies, Chips, Cookies, Sweet Pastries, Store Bought Juices, Alcohol in Excess of 1-2 drinks a day, Artificial Sweeteners, Coffee Creamer, and  Sugar-free Products. This will help patient to have stable blood glucose profile and potentially avoid unintended weight gain.   - I encouraged the patient to switch to unprocessed or minimally processed complex starch and increased protein intake (animal or plant source), fruits, and vegetables.   - Patient is advised to stick to a routine mealtimes to eat 3 meals a day and avoid unnecessary snacks (to snack only to correct hypoglycemia).  - he is following with Penny Crumpton, RDN, CDE for diabetes education.  - I have approached him with the following individualized plan to manage his diabetes and patient agrees:   -His diabetes will be managed as type 1, likely from damage to pancreas from chronic inflammation.  -He is advised to continue his Lantus  12 units SQ nightly and continue his Fiasp  4-7 units TID with meals if glucose above 70 and he is eating (Specific instructions on how to titrate insulin  dosage based on glucose readings given to patient in writing).  He has managed well on this regimen thus far.  We did discuss what changes would need to be made if he were to do 6 small meals per day.  -he is encouraged to continue monitoring glucose 4 times daily (using his CGM), before meals and before bed, and to call the clinic if he has readings less than 70 or above 200 for 3 tests in a row.  - he is warned not to take insulin  without proper monitoring per  orders. - Adjustment parameters are given to him for hypo and hyperglycemia in writing.  -He reportedly did not tolerate Metformin , Jardiance , Actos , or Glipizide  in the past.  - he is not an ideal candidate for incretin therapy given body habitus and BMI <25.  - Specific targets for  A1c; LDL, HDL, and Triglycerides were discussed with the patient.  2) Blood Pressure /Hypertension:  his blood pressure is controlled to target.   he is advised to continue his current medications as prescribed by PCP.  3) Lipids/Hyperlipidemia:    His previsit lipid panel from 02/28/21 shows controlled LDL of 88.  He is not on any lipid lowering medications at this time.    4)  Weight/Diet:  his Body mass index is 22 kg/m.  -     he is NOT a candidate for weight loss.  Exercise, and detailed carbohydrates information provided  -  detailed on discharge instructions.  5) Chronic Care/Health Maintenance: -he is on ACEI/ARB and not on Statin medications and is encouraged to initiate and continue to follow up with Ophthalmology, Dentist, Podiatrist at least yearly or according to recommendations, and advised to stay away from smoking. I have recommended yearly flu vaccine and pneumonia vaccine at least every 5 years; moderate intensity exercise for up to 150 minutes weekly; and sleep for at least 7 hours a day.  6) Hypothyroidism-RAI induced Had RAI for hyperthyroidism 20+ years ago.  He is currently on Levothyroxine  150 mcg po daily before breakfast which is significantly higher than his weight based maximum dose of around 106 mcg.   Antibody testing was negative, ruling out autoimmune thyroid  dysfunction.  There are no recent TFTs to review.  Thus he is advised to continue Levothyroxine  150 mcg po daily for now.  Will check TFTs prior to next visit and adjust dose accordingly.   - The correct intake of thyroid  hormone (Levothyroxine , Synthroid ), is on empty stomach first thing in the morning, with water,  separated by at least 30 minutes from breakfast and  other medications,  and separated by more than 4 hours from calcium, iron, multivitamins, acid reflux medications (PPIs).  - This medication is a life-long medication and will be needed to correct thyroid  hormone imbalances for the rest of your life.  The dose may change from time to time, based on thyroid  blood work.  - It is extremely important to be consistent taking this medication, near the same time each morning.  -AVOID TAKING PRODUCTS CONTAINING BIOTIN (commonly found in Hair, Skin, Nails vitamins) AS IT INTERFERES WITH THE VALIDITY OF THYROID  FUNCTION BLOOD TESTS.  7) Vitamin D  deficiency His recent vitamin d  level was low at 21.1.  He did start OTC multivitamin with Vitamin D  in it.  I advised him he needs to take 5000 units of Vitamin D3 daily.  Will plan to recheck on subsequent visits.    - he is advised to maintain close follow up with Jose Evans, Cory, NP for primary care needs, as well as his other providers for optimal and coordinated care.     I spent  50  minutes in the care of the patient today including review of labs from CMP, Lipids, Thyroid  Function, Hematology (current and previous including abstractions from other facilities); face-to-face time discussing  his blood glucose readings/logs, discussing hypoglycemia and hyperglycemia episodes and symptoms, medications doses, his options of short and long term treatment based on the latest standards of care / guidelines;  discussion about incorporating lifestyle medicine;  and documenting the encounter. Risk reduction counseling performed per USPSTF guidelines to reduce obesity and cardiovascular risk factors.     Please refer to Patient Instructions for Blood Glucose Monitoring and Insulin /Medications Dosing Guide  in media tab for additional information. Please  also refer to  Patient Self Inventory in the Media  tab for reviewed elements of pertinent patient  history.  Jose Evans participated in the discussions, expressed understanding, and voiced agreement with the above plans.  All questions were answered to his satisfaction. he is encouraged to contact clinic should he have any questions or concerns prior to his return visit.   Follow up plan: - Return in about 4 months (around 08/27/2024) for Diabetes F/U with A1c in office, Thyroid  follow up, Previsit labs, Bring meter and logs.   Benton Rio, [facility] [address] [place], [place] [address] Phone: [phone] Fax: [phone]  04/29/2024, 9:43 AM    "

## 2024-05-03 ENCOUNTER — Telehealth: Payer: Self-pay

## 2024-05-03 ENCOUNTER — Other Ambulatory Visit (HOSPITAL_COMMUNITY): Payer: Self-pay

## 2024-05-03 NOTE — Telephone Encounter (Signed)
 Pharmacy Patient Advocate Encounter   Received notification from [facility] KEY that prior authorization for dicyclomine  (BENTYL ) 20 MG tablet  is required/requested.   Insurance verification completed.   The patient is insured through [facility].   Per test claim: PA required; PA submitted to above mentioned insurance via Latent Key/confirmation #/EOC [facility] Status is pending

## 2024-05-05 NOTE — Telephone Encounter (Signed)
 Patient notified of update  and verbalized understanding.

## 2024-05-05 NOTE — Telephone Encounter (Signed)
 Pharmacy Patient Advocate Encounter  Received notification from [facility] that Prior Authorization for Dicyclomine  HCl 20MG  tablets  has been APPROVED from 04/21/2024 to 04/20/2025   PA #/Case ID/Reference #: [id number]

## 2024-08-29 ENCOUNTER — Ambulatory Visit: Admitting: Nurse Practitioner
